# Patient Record
Sex: Female | Born: 1960
Health system: Southern US, Community
[De-identification: ages and names within clinical notes are randomized; demographics above are authoritative.]

## PROBLEM LIST (undated history)

## (undated) VITALS — BP 126/83 | HR 81 | Temp 98.0°F | Resp 18 | Ht 67.0 in | Wt 240.0 lb

## (undated) DIAGNOSIS — S82892A Other fracture of left lower leg, initial encounter for closed fracture: Secondary | ICD-10-CM

## (undated) DIAGNOSIS — G40909 Epilepsy, unspecified, not intractable, without status epilepticus: Secondary | ICD-10-CM

## (undated) DIAGNOSIS — M549 Dorsalgia, unspecified: Secondary | ICD-10-CM

## (undated) DIAGNOSIS — M129 Arthropathy, unspecified: Secondary | ICD-10-CM

## (undated) DIAGNOSIS — F0280 Dementia in other diseases classified elsewhere without behavioral disturbance: Secondary | ICD-10-CM

## (undated) DIAGNOSIS — J45909 Unspecified asthma, uncomplicated: Secondary | ICD-10-CM

## (undated) DIAGNOSIS — D649 Anemia, unspecified: Secondary | ICD-10-CM

## (undated) DIAGNOSIS — M797 Fibromyalgia: Secondary | ICD-10-CM

## (undated) DIAGNOSIS — R569 Unspecified convulsions: Secondary | ICD-10-CM

## (undated) DIAGNOSIS — Z9981 Dependence on supplemental oxygen: Secondary | ICD-10-CM

## (undated) DIAGNOSIS — G35 Multiple sclerosis: Secondary | ICD-10-CM

## (undated) DIAGNOSIS — F329 Major depressive disorder, single episode, unspecified: Secondary | ICD-10-CM

## (undated) DIAGNOSIS — K219 Gastro-esophageal reflux disease without esophagitis: Secondary | ICD-10-CM

## (undated) DIAGNOSIS — G4733 Obstructive sleep apnea (adult) (pediatric): Secondary | ICD-10-CM

## (undated) DIAGNOSIS — I10 Essential (primary) hypertension: Secondary | ICD-10-CM

## (undated) DIAGNOSIS — Z9889 Other specified postprocedural states: Secondary | ICD-10-CM

## (undated) DIAGNOSIS — F319 Bipolar disorder, unspecified: Secondary | ICD-10-CM

## (undated) DIAGNOSIS — R112 Nausea with vomiting, unspecified: Secondary | ICD-10-CM

## (undated) DIAGNOSIS — R7309 Other abnormal glucose: Secondary | ICD-10-CM

## (undated) DIAGNOSIS — F411 Generalized anxiety disorder: Secondary | ICD-10-CM

## (undated) DIAGNOSIS — M1990 Unspecified osteoarthritis, unspecified site: Secondary | ICD-10-CM

## (undated) HISTORY — DX: Obstructive sleep apnea (adult) (pediatric): G47.33

## (undated) HISTORY — PX: CHOLECYSTECTOMY: SHX55

## (undated) HISTORY — PX: TUBAL LIGATION: SHX77

## (undated) HISTORY — DX: Other abnormal glucose: R73.09

## (undated) HISTORY — DX: Epilepsy, unspecified, not intractable, without status epilepticus: G40.909

## (undated) HISTORY — DX: Dementia in other diseases classified elsewhere, unspecified severity, without behavioral disturbance, psychotic disturbance, mood disturbance, and anxiety: F02.80

## (undated) HISTORY — DX: Gastro-esophageal reflux disease without esophagitis: K21.9

## (undated) HISTORY — PX: VAGINAL HYSTERECTOMY: SUR661

## (undated) HISTORY — PX: APPENDECTOMY: SHX54

## (undated) HISTORY — DX: Arthropathy, unspecified: M12.9

## (undated) HISTORY — DX: Fibromyalgia: M79.7

## (undated) HISTORY — PX: HERNIA REPAIR: SHX51

## (undated) HISTORY — PX: BREAST SURGERY: SHX581

## (undated) HISTORY — DX: Dorsalgia, unspecified: M54.9

## (undated) HISTORY — DX: Generalized anxiety disorder: F41.1

## (undated) HISTORY — DX: Anemia, unspecified: D64.9

## (undated) HISTORY — DX: Major depressive disorder, single episode, unspecified: F32.9

---

## 1986-07-06 HISTORY — PX: TUBAL LIGATION: SHX77

## 1986-07-06 HISTORY — PX: APPENDECTOMY: SHX54

## 1998-07-19 ENCOUNTER — Encounter: Payer: Self-pay | Admitting: Emergency Medicine

## 1998-07-19 ENCOUNTER — Emergency Department (HOSPITAL_COMMUNITY): Admission: EM | Admit: 1998-07-19 | Discharge: 1998-07-19 | Payer: Self-pay | Admitting: Emergency Medicine

## 1998-09-30 ENCOUNTER — Inpatient Hospital Stay (HOSPITAL_COMMUNITY): Admission: AD | Admit: 1998-09-30 | Discharge: 1998-09-30 | Payer: Self-pay | Admitting: Obstetrics & Gynecology

## 1998-10-10 ENCOUNTER — Other Ambulatory Visit: Admission: RE | Admit: 1998-10-10 | Discharge: 1998-10-10 | Payer: Self-pay | Admitting: Obstetrics and Gynecology

## 1998-11-27 ENCOUNTER — Inpatient Hospital Stay (HOSPITAL_COMMUNITY): Admission: AD | Admit: 1998-11-27 | Discharge: 1998-11-27 | Payer: Self-pay | Admitting: Obstetrics and Gynecology

## 1998-12-13 ENCOUNTER — Inpatient Hospital Stay (HOSPITAL_COMMUNITY): Admission: RE | Admit: 1998-12-13 | Discharge: 1998-12-14 | Payer: Self-pay | Admitting: Obstetrics and Gynecology

## 1999-09-05 ENCOUNTER — Encounter: Payer: Self-pay | Admitting: Emergency Medicine

## 1999-09-05 ENCOUNTER — Emergency Department (HOSPITAL_COMMUNITY): Admission: EM | Admit: 1999-09-05 | Discharge: 1999-09-05 | Payer: Self-pay | Admitting: Emergency Medicine

## 1999-09-15 ENCOUNTER — Encounter (INDEPENDENT_AMBULATORY_CARE_PROVIDER_SITE_OTHER): Payer: Self-pay | Admitting: *Deleted

## 1999-09-15 ENCOUNTER — Encounter: Payer: Self-pay | Admitting: Cardiovascular Disease

## 1999-09-15 ENCOUNTER — Inpatient Hospital Stay (HOSPITAL_COMMUNITY): Admission: AD | Admit: 1999-09-15 | Discharge: 1999-09-17 | Payer: Self-pay | Admitting: Cardiovascular Disease

## 1999-09-16 ENCOUNTER — Encounter: Payer: Self-pay | Admitting: Cardiovascular Disease

## 1999-09-25 ENCOUNTER — Encounter: Payer: Self-pay | Admitting: Critical Care Medicine

## 2003-07-07 HISTORY — PX: VAGINAL HYSTERECTOMY: SUR661

## 2005-01-28 ENCOUNTER — Emergency Department (HOSPITAL_COMMUNITY): Admission: EM | Admit: 2005-01-28 | Discharge: 2005-01-28 | Payer: Self-pay | Admitting: Emergency Medicine

## 2005-08-20 ENCOUNTER — Emergency Department (HOSPITAL_COMMUNITY): Admission: EM | Admit: 2005-08-20 | Discharge: 2005-08-20 | Payer: Self-pay | Admitting: Emergency Medicine

## 2005-08-21 ENCOUNTER — Emergency Department (HOSPITAL_COMMUNITY): Admission: EM | Admit: 2005-08-21 | Discharge: 2005-08-21 | Payer: Self-pay | Admitting: Emergency Medicine

## 2005-08-26 ENCOUNTER — Ambulatory Visit: Payer: Self-pay | Admitting: Internal Medicine

## 2005-08-31 ENCOUNTER — Ambulatory Visit: Payer: Self-pay | Admitting: Internal Medicine

## 2005-09-01 ENCOUNTER — Ambulatory Visit: Payer: Self-pay | Admitting: Internal Medicine

## 2005-11-17 ENCOUNTER — Emergency Department (HOSPITAL_COMMUNITY): Admission: EM | Admit: 2005-11-17 | Discharge: 2005-11-17 | Payer: Self-pay | Admitting: Emergency Medicine

## 2005-11-29 ENCOUNTER — Emergency Department (HOSPITAL_COMMUNITY): Admission: EM | Admit: 2005-11-29 | Discharge: 2005-11-29 | Payer: Self-pay | Admitting: Emergency Medicine

## 2006-01-28 ENCOUNTER — Emergency Department (HOSPITAL_COMMUNITY): Admission: EM | Admit: 2006-01-28 | Discharge: 2006-01-28 | Payer: Self-pay | Admitting: Emergency Medicine

## 2006-06-03 ENCOUNTER — Inpatient Hospital Stay (HOSPITAL_COMMUNITY): Admission: AD | Admit: 2006-06-03 | Discharge: 2006-06-03 | Payer: Self-pay | Admitting: Gynecology

## 2006-09-24 ENCOUNTER — Emergency Department (HOSPITAL_COMMUNITY): Admission: EM | Admit: 2006-09-24 | Discharge: 2006-09-24 | Payer: Self-pay | Admitting: Emergency Medicine

## 2006-10-28 ENCOUNTER — Emergency Department (HOSPITAL_COMMUNITY): Admission: EM | Admit: 2006-10-28 | Discharge: 2006-10-28 | Payer: Self-pay | Admitting: Emergency Medicine

## 2006-11-25 ENCOUNTER — Emergency Department (HOSPITAL_COMMUNITY): Admission: EM | Admit: 2006-11-25 | Discharge: 2006-11-25 | Payer: Self-pay | Admitting: Emergency Medicine

## 2006-12-02 ENCOUNTER — Encounter: Admission: RE | Admit: 2006-12-02 | Discharge: 2006-12-02 | Payer: Self-pay | Admitting: Orthopedic Surgery

## 2006-12-05 ENCOUNTER — Emergency Department (HOSPITAL_COMMUNITY): Admission: EM | Admit: 2006-12-05 | Discharge: 2006-12-05 | Payer: Self-pay | Admitting: Emergency Medicine

## 2006-12-06 ENCOUNTER — Observation Stay (HOSPITAL_COMMUNITY): Admission: EM | Admit: 2006-12-06 | Discharge: 2006-12-06 | Payer: Self-pay | Admitting: Emergency Medicine

## 2006-12-20 ENCOUNTER — Encounter: Admission: RE | Admit: 2006-12-20 | Discharge: 2006-12-20 | Payer: Self-pay | Admitting: Orthopedic Surgery

## 2006-12-24 ENCOUNTER — Ambulatory Visit (HOSPITAL_COMMUNITY): Admission: RE | Admit: 2006-12-24 | Discharge: 2006-12-24 | Payer: Self-pay | Admitting: Obstetrics and Gynecology

## 2006-12-24 ENCOUNTER — Encounter (INDEPENDENT_AMBULATORY_CARE_PROVIDER_SITE_OTHER): Payer: Self-pay | Admitting: Obstetrics and Gynecology

## 2007-01-05 ENCOUNTER — Encounter: Admission: RE | Admit: 2007-01-05 | Discharge: 2007-01-05 | Payer: Self-pay | Admitting: Orthopedic Surgery

## 2007-01-14 ENCOUNTER — Ambulatory Visit (HOSPITAL_COMMUNITY): Admission: RE | Admit: 2007-01-14 | Discharge: 2007-01-14 | Payer: Self-pay | Admitting: Obstetrics and Gynecology

## 2007-02-17 ENCOUNTER — Emergency Department (HOSPITAL_COMMUNITY): Admission: EM | Admit: 2007-02-17 | Discharge: 2007-02-18 | Payer: Self-pay | Admitting: Emergency Medicine

## 2007-04-24 ENCOUNTER — Inpatient Hospital Stay (HOSPITAL_COMMUNITY): Admission: AD | Admit: 2007-04-24 | Discharge: 2007-04-24 | Payer: Self-pay | Admitting: Obstetrics and Gynecology

## 2008-01-16 ENCOUNTER — Ambulatory Visit (HOSPITAL_COMMUNITY): Admission: RE | Admit: 2008-01-16 | Discharge: 2008-01-16 | Payer: Self-pay | Admitting: Obstetrics and Gynecology

## 2008-01-23 ENCOUNTER — Emergency Department (HOSPITAL_COMMUNITY): Admission: EM | Admit: 2008-01-23 | Discharge: 2008-01-23 | Payer: Self-pay | Admitting: Emergency Medicine

## 2008-01-24 ENCOUNTER — Encounter: Admission: RE | Admit: 2008-01-24 | Discharge: 2008-01-24 | Payer: Self-pay | Admitting: Anesthesiology

## 2008-04-19 ENCOUNTER — Emergency Department (HOSPITAL_COMMUNITY): Admission: EM | Admit: 2008-04-19 | Discharge: 2008-04-19 | Payer: Self-pay | Admitting: Emergency Medicine

## 2008-06-01 ENCOUNTER — Inpatient Hospital Stay (HOSPITAL_COMMUNITY): Admission: EM | Admit: 2008-06-01 | Discharge: 2008-06-05 | Payer: Self-pay | Admitting: *Deleted

## 2008-06-01 ENCOUNTER — Ambulatory Visit: Payer: Self-pay | Admitting: Emergency Medicine

## 2008-06-13 DIAGNOSIS — G8929 Other chronic pain: Secondary | ICD-10-CM

## 2008-06-13 DIAGNOSIS — F319 Bipolar disorder, unspecified: Secondary | ICD-10-CM | POA: Insufficient documentation

## 2008-06-13 DIAGNOSIS — M549 Dorsalgia, unspecified: Secondary | ICD-10-CM

## 2008-06-13 DIAGNOSIS — G4733 Obstructive sleep apnea (adult) (pediatric): Secondary | ICD-10-CM | POA: Insufficient documentation

## 2008-06-13 DIAGNOSIS — F329 Major depressive disorder, single episode, unspecified: Secondary | ICD-10-CM | POA: Insufficient documentation

## 2008-06-13 DIAGNOSIS — R7309 Other abnormal glucose: Secondary | ICD-10-CM | POA: Insufficient documentation

## 2008-06-13 DIAGNOSIS — J45909 Unspecified asthma, uncomplicated: Secondary | ICD-10-CM | POA: Insufficient documentation

## 2008-06-13 DIAGNOSIS — F411 Generalized anxiety disorder: Secondary | ICD-10-CM | POA: Insufficient documentation

## 2008-06-13 DIAGNOSIS — M129 Arthropathy, unspecified: Secondary | ICD-10-CM | POA: Insufficient documentation

## 2008-06-13 DIAGNOSIS — M1990 Unspecified osteoarthritis, unspecified site: Secondary | ICD-10-CM | POA: Insufficient documentation

## 2008-06-13 HISTORY — DX: Obstructive sleep apnea (adult) (pediatric): G47.33

## 2008-06-13 HISTORY — DX: Bipolar disorder, unspecified: F31.9

## 2008-06-13 HISTORY — DX: Other abnormal glucose: R73.09

## 2008-06-13 HISTORY — DX: Other chronic pain: G89.29

## 2008-06-14 ENCOUNTER — Ambulatory Visit: Payer: Self-pay | Admitting: Critical Care Medicine

## 2008-07-05 ENCOUNTER — Ambulatory Visit: Payer: Self-pay | Admitting: Critical Care Medicine

## 2008-07-05 DIAGNOSIS — K219 Gastro-esophageal reflux disease without esophagitis: Secondary | ICD-10-CM | POA: Insufficient documentation

## 2008-07-05 HISTORY — DX: Gastro-esophageal reflux disease without esophagitis: K21.9

## 2008-07-06 DIAGNOSIS — J9611 Chronic respiratory failure with hypoxia: Secondary | ICD-10-CM

## 2008-07-06 HISTORY — DX: Chronic respiratory failure with hypoxia: J96.11

## 2008-09-12 ENCOUNTER — Ambulatory Visit: Payer: Self-pay | Admitting: Critical Care Medicine

## 2008-09-12 DIAGNOSIS — R1314 Dysphagia, pharyngoesophageal phase: Secondary | ICD-10-CM

## 2008-09-12 HISTORY — DX: Dysphagia, pharyngoesophageal phase: R13.14

## 2008-09-20 ENCOUNTER — Ambulatory Visit: Payer: Self-pay | Admitting: Internal Medicine

## 2008-09-20 DIAGNOSIS — J309 Allergic rhinitis, unspecified: Secondary | ICD-10-CM | POA: Insufficient documentation

## 2008-09-20 DIAGNOSIS — Z91038 Other insect allergy status: Secondary | ICD-10-CM | POA: Insufficient documentation

## 2008-09-20 HISTORY — DX: Allergic rhinitis, unspecified: J30.9

## 2008-09-20 LAB — CONVERTED CEMR LAB
Basophils Absolute: 0 10*3/uL (ref 0.0–0.1)
Basophils Relative: 0.8 % (ref 0.0–3.0)
Eosinophils Absolute: 0.4 10*3/uL (ref 0.0–0.7)
Eosinophils Relative: 7.6 % — ABNORMAL HIGH (ref 0.0–5.0)
HCT: 40.2 % (ref 36.0–46.0)
Hemoglobin: 13.5 g/dL (ref 12.0–15.0)
Lymphocytes Relative: 31.4 % (ref 12.0–46.0)
Lymphs Abs: 1.9 10*3/uL (ref 0.7–4.0)
MCHC: 33.6 g/dL (ref 30.0–36.0)
MCV: 84.3 fL (ref 78.0–100.0)
Monocytes Absolute: 0.5 10*3/uL (ref 0.1–1.0)
Monocytes Relative: 8.8 % (ref 3.0–12.0)
Neutro Abs: 3.1 10*3/uL (ref 1.4–7.7)
Neutrophils Relative %: 51.4 % (ref 43.0–77.0)
Platelets: 317 10*3/uL (ref 150.0–400.0)
RBC: 4.78 M/uL (ref 3.87–5.11)
RDW: 13.3 % (ref 11.5–14.6)
WBC: 5.9 10*3/uL (ref 4.5–10.5)

## 2008-09-21 ENCOUNTER — Encounter: Payer: Self-pay | Admitting: Internal Medicine

## 2008-09-21 LAB — CONVERTED CEMR LAB: IgE (Immunoglobulin E), Serum: 502.1 intl units/mL — ABNORMAL HIGH (ref 0.0–180.0)

## 2008-10-07 DIAGNOSIS — R51 Headache: Secondary | ICD-10-CM

## 2008-10-10 ENCOUNTER — Ambulatory Visit: Payer: Self-pay | Admitting: Internal Medicine

## 2008-10-10 DIAGNOSIS — Z91013 Allergy to seafood: Secondary | ICD-10-CM | POA: Insufficient documentation

## 2008-10-11 ENCOUNTER — Ambulatory Visit: Payer: Self-pay | Admitting: Internal Medicine

## 2008-10-11 DIAGNOSIS — R1319 Other dysphagia: Secondary | ICD-10-CM | POA: Insufficient documentation

## 2008-11-09 ENCOUNTER — Ambulatory Visit: Payer: Self-pay | Admitting: Critical Care Medicine

## 2008-11-13 ENCOUNTER — Ambulatory Visit: Payer: Self-pay | Admitting: Internal Medicine

## 2008-11-13 ENCOUNTER — Ambulatory Visit (HOSPITAL_COMMUNITY): Admission: RE | Admit: 2008-11-13 | Discharge: 2008-11-13 | Payer: Self-pay | Admitting: Internal Medicine

## 2009-01-08 ENCOUNTER — Emergency Department (HOSPITAL_COMMUNITY): Admission: EM | Admit: 2009-01-08 | Discharge: 2009-01-09 | Payer: Self-pay | Admitting: Emergency Medicine

## 2009-01-09 ENCOUNTER — Emergency Department (HOSPITAL_COMMUNITY): Admission: EM | Admit: 2009-01-09 | Discharge: 2009-01-09 | Payer: Self-pay | Admitting: Emergency Medicine

## 2009-01-16 ENCOUNTER — Ambulatory Visit: Payer: Self-pay | Admitting: Critical Care Medicine

## 2009-01-22 ENCOUNTER — Emergency Department (HOSPITAL_COMMUNITY): Admission: EM | Admit: 2009-01-22 | Discharge: 2009-01-22 | Payer: Self-pay | Admitting: Emergency Medicine

## 2009-01-23 ENCOUNTER — Emergency Department (HOSPITAL_COMMUNITY): Admission: EM | Admit: 2009-01-23 | Discharge: 2009-01-23 | Payer: Self-pay | Admitting: Emergency Medicine

## 2009-01-28 ENCOUNTER — Emergency Department (HOSPITAL_COMMUNITY): Admission: EM | Admit: 2009-01-28 | Discharge: 2009-01-29 | Payer: Self-pay | Admitting: Emergency Medicine

## 2009-01-29 ENCOUNTER — Emergency Department (HOSPITAL_COMMUNITY): Admission: EM | Admit: 2009-01-29 | Discharge: 2009-01-29 | Payer: Self-pay | Admitting: Emergency Medicine

## 2009-02-18 ENCOUNTER — Telehealth (INDEPENDENT_AMBULATORY_CARE_PROVIDER_SITE_OTHER): Payer: Self-pay | Admitting: *Deleted

## 2009-02-18 ENCOUNTER — Encounter: Payer: Self-pay | Admitting: Internal Medicine

## 2009-02-18 ENCOUNTER — Ambulatory Visit: Payer: Self-pay | Admitting: Critical Care Medicine

## 2009-02-19 ENCOUNTER — Ambulatory Visit: Payer: Self-pay | Admitting: Internal Medicine

## 2009-02-20 ENCOUNTER — Encounter: Payer: Self-pay | Admitting: Critical Care Medicine

## 2009-03-01 ENCOUNTER — Ambulatory Visit: Payer: Self-pay | Admitting: Internal Medicine

## 2009-03-06 ENCOUNTER — Emergency Department (HOSPITAL_COMMUNITY): Admission: EM | Admit: 2009-03-06 | Discharge: 2009-03-07 | Payer: Self-pay | Admitting: Emergency Medicine

## 2009-03-15 ENCOUNTER — Emergency Department (HOSPITAL_COMMUNITY): Admission: EM | Admit: 2009-03-15 | Discharge: 2009-03-16 | Payer: Self-pay | Admitting: Emergency Medicine

## 2009-03-19 ENCOUNTER — Ambulatory Visit: Payer: Self-pay | Admitting: Internal Medicine

## 2009-03-20 ENCOUNTER — Emergency Department (HOSPITAL_COMMUNITY): Admission: EM | Admit: 2009-03-20 | Discharge: 2009-03-21 | Payer: Self-pay | Admitting: Emergency Medicine

## 2009-03-21 ENCOUNTER — Encounter: Payer: Self-pay | Admitting: Critical Care Medicine

## 2009-03-22 ENCOUNTER — Telehealth: Payer: Self-pay | Admitting: Critical Care Medicine

## 2009-03-22 ENCOUNTER — Ambulatory Visit: Payer: Self-pay | Admitting: Internal Medicine

## 2009-03-27 ENCOUNTER — Emergency Department (HOSPITAL_COMMUNITY): Admission: EM | Admit: 2009-03-27 | Discharge: 2009-03-28 | Payer: Self-pay | Admitting: Emergency Medicine

## 2009-03-29 ENCOUNTER — Emergency Department (HOSPITAL_COMMUNITY): Admission: EM | Admit: 2009-03-29 | Discharge: 2009-03-30 | Payer: Self-pay | Admitting: Emergency Medicine

## 2009-03-30 ENCOUNTER — Encounter: Payer: Self-pay | Admitting: Critical Care Medicine

## 2009-03-31 ENCOUNTER — Emergency Department (HOSPITAL_COMMUNITY): Admission: EM | Admit: 2009-03-31 | Discharge: 2009-03-31 | Payer: Self-pay | Admitting: Emergency Medicine

## 2009-04-01 ENCOUNTER — Encounter: Payer: Self-pay | Admitting: Critical Care Medicine

## 2009-04-02 ENCOUNTER — Ambulatory Visit: Payer: Self-pay | Admitting: Internal Medicine

## 2009-04-02 ENCOUNTER — Telehealth: Payer: Self-pay | Admitting: Critical Care Medicine

## 2009-04-02 ENCOUNTER — Ambulatory Visit: Payer: Self-pay | Admitting: Critical Care Medicine

## 2009-04-02 DIAGNOSIS — J383 Other diseases of vocal cords: Secondary | ICD-10-CM | POA: Insufficient documentation

## 2009-04-02 HISTORY — DX: Other diseases of vocal cords: J38.3

## 2009-04-03 ENCOUNTER — Encounter: Payer: Self-pay | Admitting: Critical Care Medicine

## 2009-04-05 ENCOUNTER — Ambulatory Visit: Payer: Self-pay | Admitting: Cardiovascular Disease

## 2009-04-05 ENCOUNTER — Ambulatory Visit: Payer: Self-pay | Admitting: Pulmonary Disease

## 2009-04-05 ENCOUNTER — Ambulatory Visit: Payer: Self-pay | Admitting: Internal Medicine

## 2009-04-05 ENCOUNTER — Inpatient Hospital Stay (HOSPITAL_COMMUNITY): Admission: EM | Admit: 2009-04-05 | Discharge: 2009-04-08 | Payer: Self-pay | Admitting: Emergency Medicine

## 2009-04-08 ENCOUNTER — Encounter: Payer: Self-pay | Admitting: Internal Medicine

## 2009-04-08 ENCOUNTER — Encounter: Payer: Self-pay | Admitting: Critical Care Medicine

## 2009-04-14 ENCOUNTER — Telehealth (INDEPENDENT_AMBULATORY_CARE_PROVIDER_SITE_OTHER): Payer: Self-pay | Admitting: Physician Assistant

## 2009-04-15 ENCOUNTER — Ambulatory Visit: Payer: Self-pay | Admitting: Critical Care Medicine

## 2009-04-15 ENCOUNTER — Ambulatory Visit: Payer: Self-pay | Admitting: Internal Medicine

## 2009-04-16 ENCOUNTER — Emergency Department (HOSPITAL_COMMUNITY): Admission: EM | Admit: 2009-04-16 | Discharge: 2009-04-16 | Payer: Self-pay | Admitting: Emergency Medicine

## 2009-04-30 ENCOUNTER — Telehealth: Payer: Self-pay | Admitting: Critical Care Medicine

## 2009-05-01 ENCOUNTER — Encounter: Payer: Self-pay | Admitting: Pulmonary Disease

## 2009-05-01 ENCOUNTER — Ambulatory Visit (HOSPITAL_BASED_OUTPATIENT_CLINIC_OR_DEPARTMENT_OTHER): Admission: RE | Admit: 2009-05-01 | Discharge: 2009-05-01 | Payer: Self-pay | Admitting: Critical Care Medicine

## 2009-05-09 ENCOUNTER — Telehealth (INDEPENDENT_AMBULATORY_CARE_PROVIDER_SITE_OTHER): Payer: Self-pay | Admitting: *Deleted

## 2009-05-12 ENCOUNTER — Ambulatory Visit: Payer: Self-pay | Admitting: Pulmonary Disease

## 2009-05-13 ENCOUNTER — Ambulatory Visit: Payer: Self-pay | Admitting: Pulmonary Disease

## 2009-05-15 ENCOUNTER — Encounter: Payer: Self-pay | Admitting: Critical Care Medicine

## 2009-05-15 ENCOUNTER — Encounter: Admission: RE | Admit: 2009-05-15 | Discharge: 2009-06-05 | Payer: Self-pay | Admitting: Critical Care Medicine

## 2009-05-20 ENCOUNTER — Emergency Department (HOSPITAL_COMMUNITY): Admission: EM | Admit: 2009-05-20 | Discharge: 2009-05-20 | Payer: Self-pay | Admitting: Emergency Medicine

## 2009-05-21 ENCOUNTER — Observation Stay (HOSPITAL_COMMUNITY): Admission: EM | Admit: 2009-05-21 | Discharge: 2009-05-21 | Payer: Self-pay | Admitting: Emergency Medicine

## 2009-05-22 ENCOUNTER — Emergency Department (HOSPITAL_COMMUNITY): Admission: EM | Admit: 2009-05-22 | Discharge: 2009-05-22 | Payer: Self-pay | Admitting: Emergency Medicine

## 2009-05-24 ENCOUNTER — Ambulatory Visit: Payer: Self-pay | Admitting: Internal Medicine

## 2009-06-03 ENCOUNTER — Encounter: Payer: Self-pay | Admitting: Critical Care Medicine

## 2009-06-04 ENCOUNTER — Ambulatory Visit: Payer: Self-pay | Admitting: Pulmonary Disease

## 2009-06-04 DIAGNOSIS — J4540 Moderate persistent asthma, uncomplicated: Secondary | ICD-10-CM

## 2009-06-04 DIAGNOSIS — J45909 Unspecified asthma, uncomplicated: Secondary | ICD-10-CM

## 2009-06-04 HISTORY — DX: Moderate persistent asthma, uncomplicated: J45.40

## 2009-06-17 ENCOUNTER — Emergency Department (HOSPITAL_COMMUNITY): Admission: EM | Admit: 2009-06-17 | Discharge: 2009-06-18 | Payer: Self-pay | Admitting: Emergency Medicine

## 2009-08-01 ENCOUNTER — Telehealth (INDEPENDENT_AMBULATORY_CARE_PROVIDER_SITE_OTHER): Payer: Self-pay | Admitting: *Deleted

## 2009-08-07 ENCOUNTER — Encounter: Payer: Self-pay | Admitting: Critical Care Medicine

## 2009-09-04 ENCOUNTER — Telehealth (INDEPENDENT_AMBULATORY_CARE_PROVIDER_SITE_OTHER): Payer: Self-pay | Admitting: *Deleted

## 2009-09-25 ENCOUNTER — Telehealth: Payer: Self-pay | Admitting: Internal Medicine

## 2009-09-26 ENCOUNTER — Emergency Department (HOSPITAL_COMMUNITY)
Admission: EM | Admit: 2009-09-26 | Discharge: 2009-09-26 | Payer: Self-pay | Source: Home / Self Care | Admitting: Emergency Medicine

## 2009-09-26 ENCOUNTER — Encounter: Payer: Self-pay | Admitting: Internal Medicine

## 2009-09-27 ENCOUNTER — Ambulatory Visit: Payer: Self-pay | Admitting: Internal Medicine

## 2009-09-27 ENCOUNTER — Telehealth (INDEPENDENT_AMBULATORY_CARE_PROVIDER_SITE_OTHER): Payer: Self-pay | Admitting: *Deleted

## 2009-09-27 DIAGNOSIS — R0989 Other specified symptoms and signs involving the circulatory and respiratory systems: Secondary | ICD-10-CM | POA: Insufficient documentation

## 2009-09-27 DIAGNOSIS — R109 Unspecified abdominal pain: Secondary | ICD-10-CM

## 2009-09-27 DIAGNOSIS — R0609 Other forms of dyspnea: Secondary | ICD-10-CM | POA: Insufficient documentation

## 2009-09-27 HISTORY — DX: Unspecified abdominal pain: R10.9

## 2009-10-01 ENCOUNTER — Emergency Department (HOSPITAL_COMMUNITY)
Admission: EM | Admit: 2009-10-01 | Discharge: 2009-10-02 | Payer: Self-pay | Source: Home / Self Care | Admitting: Emergency Medicine

## 2009-10-02 LAB — CONVERTED CEMR LAB
CK-MB: 1.9 ng/mL (ref 0.3–4.0)
Pro B Natriuretic peptide (BNP): 26 pg/mL (ref 0.0–100.0)
Relative Index: 0.7 (ref 0.0–2.5)
Sed Rate: 37 mm/hr — ABNORMAL HIGH (ref 0–22)
Total CK: 266 units/L — ABNORMAL HIGH (ref 7–177)

## 2009-10-11 ENCOUNTER — Ambulatory Visit: Payer: Self-pay | Admitting: Critical Care Medicine

## 2009-10-15 ENCOUNTER — Encounter: Payer: Self-pay | Admitting: Internal Medicine

## 2009-10-17 ENCOUNTER — Encounter: Payer: Self-pay | Admitting: Internal Medicine

## 2009-10-24 ENCOUNTER — Ambulatory Visit: Payer: Self-pay | Admitting: Critical Care Medicine

## 2009-10-29 ENCOUNTER — Ambulatory Visit: Payer: Self-pay | Admitting: Critical Care Medicine

## 2009-10-31 ENCOUNTER — Telehealth (INDEPENDENT_AMBULATORY_CARE_PROVIDER_SITE_OTHER): Payer: Self-pay | Admitting: *Deleted

## 2009-11-05 ENCOUNTER — Ambulatory Visit: Payer: Self-pay | Admitting: Critical Care Medicine

## 2009-11-05 ENCOUNTER — Telehealth (INDEPENDENT_AMBULATORY_CARE_PROVIDER_SITE_OTHER): Payer: Self-pay | Admitting: *Deleted

## 2009-11-08 ENCOUNTER — Encounter: Payer: Self-pay | Admitting: Critical Care Medicine

## 2009-11-09 ENCOUNTER — Encounter: Payer: Self-pay | Admitting: Internal Medicine

## 2009-11-11 ENCOUNTER — Telehealth (INDEPENDENT_AMBULATORY_CARE_PROVIDER_SITE_OTHER): Payer: Self-pay | Admitting: *Deleted

## 2009-11-21 ENCOUNTER — Telehealth: Payer: Self-pay | Admitting: Critical Care Medicine

## 2009-11-22 ENCOUNTER — Telehealth (INDEPENDENT_AMBULATORY_CARE_PROVIDER_SITE_OTHER): Payer: Self-pay | Admitting: *Deleted

## 2009-11-26 ENCOUNTER — Telehealth: Payer: Self-pay | Admitting: Critical Care Medicine

## 2009-12-03 ENCOUNTER — Ambulatory Visit: Payer: Self-pay | Admitting: Internal Medicine

## 2009-12-04 ENCOUNTER — Encounter: Payer: Self-pay | Admitting: Internal Medicine

## 2009-12-13 ENCOUNTER — Ambulatory Visit: Payer: Self-pay | Admitting: Internal Medicine

## 2010-01-21 ENCOUNTER — Encounter: Payer: Self-pay | Admitting: Critical Care Medicine

## 2010-01-22 ENCOUNTER — Ambulatory Visit: Payer: Self-pay | Admitting: Critical Care Medicine

## 2010-02-04 ENCOUNTER — Telehealth: Payer: Self-pay | Admitting: Internal Medicine

## 2010-02-17 ENCOUNTER — Telehealth: Payer: Self-pay | Admitting: Internal Medicine

## 2010-02-17 ENCOUNTER — Telehealth (INDEPENDENT_AMBULATORY_CARE_PROVIDER_SITE_OTHER): Payer: Self-pay | Admitting: *Deleted

## 2010-04-25 ENCOUNTER — Emergency Department (HOSPITAL_COMMUNITY): Admission: EM | Admit: 2010-04-25 | Discharge: 2010-04-25 | Payer: Self-pay | Admitting: Emergency Medicine

## 2010-08-05 NOTE — Progress Notes (Signed)
Summary: talk to nurse  Phone Note Call from Patient Call back at Home Phone [phone]   Caller: Patient Call For: Chappuis Summary of Call: calling to talk to nurse about diet dr Delford Field wanted her on Initial call taken by: Rickard Patience,  April 02, 2009 5:04 PM  Follow-up for Phone Call        pt called back re: diet PEW was going to give her yesterday at her appt.  Please respond Follow-up by: Eugene Gavia,  April 03, 2009 10:43 AM  Additional Follow-up for Phone Call Additional follow up Details #1::        pt states Dr. Delford Field wanted to start the GERD diet. I have palced a copy of this at the front with pt name on it. She stated her sister will come by later to pick it up. Carron Curie CMA  April 03, 2009 11:09 AM

## 2010-08-05 NOTE — Progress Notes (Signed)
Summary: [name] refills  Phone Note Call from Patient Call back at [facility] Phone [phone]   Caller: Patient Call For: Ruppel Reason for Call: Refill Medication, Talk to Nurse Summary of Call: Need rx for [facility] called in - PW gave her samples but no rx. [facility] Initial call taken by: Eugene Gavia,  August 01, 2009 10:21 AM  Follow-up for Phone Call        The patient says the pharmacy needs reauthorization for [facility].refills. She is doing fine on this medication and is sch to f/u with PEW in March 2011. The patient aware we will give refills until she is seen for appt. Follow-up by: Michel Bickers CMA,  August 01, 2009 10:29 AM    Prescriptions: [name] 200-5 MCG/ACT AERO (MOMETASONE FURO-FORMOTEROL FUM) 2 puffs twice per day Brand medically necessary #1 x 2   Entered by:   Michel Bickers CMA   Authorized by:   Storm Frisk MD   Signed by:   Michel Bickers CMA on 08/01/2009   Method used:   [facility]        [facility]  [address] [phone]* (retail)       [address]       [place], [place]  [id number]       Ph: [phone] or [phone]       Fax: [phone]   RxID:   [id number]

## 2010-08-05 NOTE — Assessment & Plan Note (Signed)
Summary: NP follow up - med calendar   Copy to:  [name], MD Primary Provider/Referring Provider:  Juleen Starr.  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    June 14, 2008- Previous pt of Dr. Delford Field, last seen > 5years ago. -Presents for post hospital follow up. Admitted 11/27-12/1/09 for status asthmaticus with vent dependent resp. failure. Pt has a history of  asthma since childhood who presented with 5-6 hours of progressive  dyspnea and required intubation upon arrival to the emergency  department,  treated with systemic  steroids, nebulized bronchodilators, mechanical ventilatory support, DVT  prophylaxis, stress ulcer prophylaxis, and glycemic control.  She  responded well to these therapies.  She extubated herself in the early  morning hours of June 03, 2008, without problems.    09/12/08- asthma flare- tx w/ steroids. referral to allergy and GI   10/10/08- Allergy evaluation- asthma- Tree pollen heavy now and distinctly bothering eyes and nose itch and drain. Feels wheezier. Here for allergy testing- msses antihistamines. Skin tests- Strongly POS grass, weed, tree, dust    Nov 09, 2008-- acute office viist. Complains of increased SOB, wheezing, prod cough with brownish-colored mucus onset last night - denies f/c/s. Assures  is taking all her meds w/o missed doses. Has seen some blood streaked mucous    September 27, 2009 ccAcute visit.  Pt was seen at [facility] ER last night with increased SOB and left side chest and abdominal pain.  She was told that she may have possible blood clot b/c of elevated d-dimer.  She states that her CP only occurs upon inspration.  Gets SOB walking from room to room = baseline.  new onset epigastric and LLQ pain x 1 week  better lying down  and worse sitting.  cp worse with deep breath, abd pain worse with coughing.  no change in bowels better with xanax.  Never present supine.  No n or v.   October 11, 2009--Presents for a follow up and med reivew. est med calendar - pt did not bring her meds with her today.  pt c/o pain on left side at groin area, SOB and "some wheezing" x1week. Last visit had atypical chest/abd pain suspected to be IBS. Cardiac enzymes neg. Neg w/up for PE and ESR and BNP normal. She has had an extensive work up for dyspnea/DOE/ and atypical chest pain. over last 6 months she has had numerous chest xray-NAD , CT chest-neg for PE and VQ scan x 2 w/  low probalbility for PE. 2 D ECHO 10/10 showed nml LVF w/ EF at 55-60% no PFO noted. Sleep test showed only mild OSA -no CPAP indicated. Last visit she did desat with walking but with quick recovery. Unfortuanetly did not bring in meds today. We reviewed her list and she is on multiple narcotics, pschy meds.   Medications Prior to Update: 1)  Dulera 200-5 Mcg/act Aero (Mometasone Furo-Formoterol Fum) .... 2 Puffs Twice Per Day 2)  Neurontin 400 Mg Caps (Gabapentin) .Marland Kitchen.. 1 By Mouth Four Times Daily 3)  Dexilant 60 Mg Cpdr (Dexlansoprazole) .... One By Mouth Daily 4)  Ms Contin 30 Mg Xr12h-Tab (Morphine Sulfate) .Marland Kitchen.. 1 By Mouth Two Times A Day 5)  Nabumetone 500 Mg Tabs (Nabumetone) .... Take 1 Tablet By  Mouth Two Times A Day 6)  Flexeril 10 Mg Tabs (Cyclobenzaprine Hcl) .... As Needed By Mouth Muscle Spasms 7)  Epipen 0.3 Mg/0.40ml (1:1000) Devi (Epinephrine Hcl (Anaphylaxis)) .... For Severe Allergic Reaction 8)  Ventolin Hfa 108 (90 Base) Mcg/act Aers (Albuterol Sulfate) .... 2 Puffs Every 4 Hours As Needed 9)  Lamictal 200 Mg Tabs (Lamotrigine) .... Take 1 1/2 Tab By Mouth At Bedtime 10)  Ambien 10 Mg Tabs (Zolpidem Tartrate) .... 1/2 Tab By Mouth At Bedtime As Needed 11)  Saphria 5mg  .... At Bedtime 12)  Alprazolam 0.25 Mg Tabs (Alprazolam) .... Take 1 Tablet By Mouth  Every 6 Hours As Needed 13)  Metoprolol Succinate 25 Mg Xr24h-Tab (Metoprolol Succinate) .... Take 1 Tablet By Mouth Once A Day  Current Medications (verified): 1)  Dulera 200-5 Mcg/act Aero (Mometasone Furo-Formoterol Fum) .... 2 Puffs Twice Per Day 2)  Neurontin 400 Mg Caps (Gabapentin) .Marland Kitchen.. 1 By Mouth Four Times Daily 3)  Dexilant 60 Mg Cpdr (Dexlansoprazole) .... One By Mouth Daily 4)  Ms Contin 30 Mg Xr12h-Tab (Morphine Sulfate) .... Take 1 Tablet By Mouth Three Times A Day 5)  Nabumetone 500 Mg Tabs (Nabumetone) .... Take 1 Tablet By Mouth Two Times A Day 6)  Flexeril 10 Mg Tabs (Cyclobenzaprine Hcl) .... As Needed By Mouth Muscle Spasms 7)  Epipen 0.3 Mg/0.39ml (1:1000) Devi (Epinephrine Hcl (Anaphylaxis)) .... For Severe Allergic Reaction 8)  Ventolin Hfa 108 (90 Base) Mcg/act Aers (Albuterol Sulfate) .... 2 Puffs Every 4 Hours As Needed 9)  Lamictal 200 Mg Tabs (Lamotrigine) .... Take 1 1/2 Tab By Mouth At Bedtime 10)  Ambien 10 Mg Tabs (Zolpidem Tartrate) .... 1/2 Tab By Mouth At Bedtime As Needed 11)  Saphris 5 Mg Subl (Asenapine Maleate) .Marland Kitchen.. 1 Under Tongue At Bedtime 12)  Alprazolam 0.25 Mg Tabs (Alprazolam) .... Take 1 Tablet By Mouth Every 6 Hours As Needed 13)  Metoprolol Succinate 25 Mg Xr24h-Tab (Metoprolol Succinate) .... Take 1 Tablet By Mouth Once A Day  Allergies (verified): 1)  ! Pcn 2)  ! Stadol 3)  ! Lasix 4)  ! Iodine 5)  ! Elavil 6)  ! * Shellfish 7)  ! * Latex  Past History:  Past Medical History: Last updated: 09/27/2009  Status Ashtmaticus--admission 06/01/08 with VDRF Allergic rhinoconjunctivitis- ------ Skin Test Pos 10/10/08 : Strongly POS grass, weed, tree, dust ?MS dx in 2004 in [place].  HYPERGLYCEMIA (ICD-790.29) Headache ANXIETY (ICD-300.00) DEPRESSION (ICD-311) BACK PAIN, CHRONIC (ICD-724.5)--chronic DDD on disablility ARTHRITIS (ICD-716.90) OBSTRUCTIVE SLEEP APNEA (ICD-327.23)- never CPAP.............Marland KitchenMarland KitchenDyke Brackett R D fibromyalgia Allergy-  shellfish Anemia  Pneumovax --April 15, 2009  flu shot 04/2009  Past Surgical History: Last updated: 09/20/2008 Appendectomy in 1987 C-section in 1988 Cholecystectomy in 1990 Foot surgery in 1995 Tubal ligation Hysterectomy in 2000 hernia repair  Family History: Last updated: 05/13/2009 Family History Ovarian Cancer--mother ( deceased at age28) Father deceased in his 34's of unknown causes. 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 2 children Patient never smoked.  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 Is Patient Diabetic? No Comments Medications reviewed with patient Daytime contact number verified with patient. Boone Master CNA  October 11, 2009 4:08 PM    Physical Exam  Additional Exam:  wt 241 > 269 September 27, 2009 >268 October 11, 2009  Gen. Pleasant, well-nourished, in no distress ENT - no lesions, no post nasal drip, class 2 airway Neck: No JVD, no thyromegaly, no carotid bruits Lungs: no use of accessory muscles, no dullness to percussion, clear without rales or rhonchi  Cardiovascular: Rhythm regular, heart sounds  normal, no murmurs or gallops, no peripheral edema Abdomen: soft and non-tender, no hepatosplenomegaly, BS normal. Musculoskeletal: No  deformities, no cyanosis or clubbing Neuro:  alert, non focal     Impression & Recommendations:  Problem # 1:  EXTRINSIC ASTHMA, UNSPECIFIED (ICD-493.00) Atypical Asthma w/ persistent dyspnea. . Extensive workup w/ multiple scan-It is unlikely she has a PE. w/ neg CT chest and 2 VQ scans in last 6 month. Discussed would like to limit additional scan to decrease radiation exposure (over the last 10 years she has had several CT scan and xrays). She walked today in office at steday pace w/ her cane w/ no desaturations maintained at 93%.  Will set up for overnight oximetry -(did have some desats w/ sleep test )  return for PFTs  cont on Dulera. for now.   Problem # 2:  G E R D (ICD-530.81) cont on dexilant and pepcid.   Her updated medication list for this problem includes:    Dexilant 60 Mg Cpdr (Dexlansoprazole) ..... One by mouth daily    Pepcid 20 Mg Tabs (Famotidine) .Marland Kitchen... 1 by mouth at bedtime  Medications Added to Medication List This Visit: 1)  Ms Contin 30 Mg Xr12h-tab (Morphine sulfate) .... Take 1 tablet by mouth three times a day 2)  Saphris 5 Mg Subl (Asenapine maleate) .Marland Kitchen.. 1 under tongue at bedtime 3)  Pepcid 20 Mg Tabs (Famotidine) .Marland Kitchen.. 1 by mouth at bedtime  Complete Medication List: 1)  Dulera 200-5 Mcg/act Aero (Mometasone furo-formoterol fum) .... 2 puffs twice per day 2)  Neurontin 400 Mg Caps (Gabapentin) .Marland Kitchen.. 1 by mouth four times daily 3)  Dexilant 60 Mg Cpdr (Dexlansoprazole) .... One by mouth daily 4)  Ms Contin 30 Mg Xr12h-tab (Morphine sulfate) .... Take 1 tablet by mouth three times a day 5)  Nabumetone 500 Mg Tabs (Nabumetone) .... Take 1 tablet by mouth two times a day 6)  Flexeril 10 Mg Tabs (Cyclobenzaprine hcl) .... As needed by mouth muscle spasms 7)  Epipen 0.3 Mg/0.92ml (1:1000) Devi (Epinephrine hcl (anaphylaxis)) .... For severe allergic reaction 8)  Ventolin Hfa 108 (90 Base) Mcg/act Aers (Albuterol sulfate) .... 2 puffs every 4 hours as needed 9)   Lamictal 200 Mg Tabs (Lamotrigine) .... Take 1 1/2 tab by mouth at bedtime 10)  Ambien 10 Mg Tabs (Zolpidem tartrate) .... 1/2 tab by mouth at bedtime as needed 11)  Saphris 5 Mg Subl (Asenapine maleate) .Marland Kitchen.. 1 under tongue at bedtime 12)  Alprazolam 0.25  Mg Tabs (Alprazolam) .... Take 1 tablet by mouth every 6 hours as needed 13)  Metoprolol Succinate 25 Mg Xr24h-tab (Metoprolol succinate) .... Take 1 tablet by mouth once a day 14)  Pepcid 20 Mg Tabs (Famotidine) .Marland Kitchen.. 1 by mouth at bedtime  Other Orders: Est. Patient Level IV (16109) DME Referral (DME)  Patient Instructions: 1)  We are setting you up for an overnight oximetry to check your oxygen while you sleep.  2)  follow up 2-3  weeks Dr. Delford Field w/ PFTs 3)  Take Elwin Sleight 2 puffs twice daily try not to  miss doses  4)  Please contact office for sooner follow up if symptoms do not improve or worsen  5)  Bring all your medicines-all the bottles, patches, herbs, etc so we can update your med list.     Appended Document: ambulatory oximetry documentation Ambulatory Pulse Oximetry  Resting; HR__91___    02 Sat__94___  Lap1 (185 feet)   HR__114___   02 Sat___93__ Lap2 (185 feet)   HR___116__   02 Sat__93___    Lap3 (185 feet)   HR_____   02 Sat_____  ___Test Completed without Difficulty __X_Test Stopped due to: increased fatigue   Boone Master CNA  October 11, 2009 5:10 PM    Clinical Lists Changes  Orders: Added new Service order of Pulse Oximetry, Ambulatory (60454) - Signed      Appended Document: NP follow up - med calendar I agree with this plan of care

## 2010-08-05 NOTE — Progress Notes (Signed)
Summary: ALLERGY  Phone Note Outgoing Call   Call placed by: T.Scott Call placed to: Patient Details for Reason: not compliant with allergy or xolair shots. Summary of Call: Mrs.Morrone got through her 1st set of allergy vac. & quit.(05-24-09) Her xolair shot she took it one time(06-04-09) and hasn't been back. We have called her serveral times with no answer and will send her a letter to ask if she plans to cont. either shot. Initial call taken by: Dimas Millin,  September 04, 2009 2:29 PM  Follow-up for Phone Call        If you don't get reponse by a week after mailing letter, then let me know and we will take these treatments off her med list. Follow-up by: Waymon Budge MD,  September 04, 2009 2:38 PM

## 2010-08-05 NOTE — Assessment & Plan Note (Signed)
Summary: Pulmonary/ acute post ER eval/ ? IBS/anxiety   Copy to:  [name], MD Primary Provider/Referring Provider:  Juleen Starr.  CC:  Acute visit.  Pt was seen at [facility] ER last night with increased SOB and left side chest and abdominal pain.  She was told that she may have possible blood clot b/c of elevated d-dimer.  She states that her CP only occurs upon inspration.  Gets SOB walking from room to room.  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    June 14, 2008- Previous pt of Dr. Delford Field, last seen > 5years ago. -Presents for post hospital follow up. Admitted 11/27-12/1/09 for status asthmaticus with vent dependent resp. failure. Pt has a history of  asthma since childhood who presented with 5-6 hours of progressive  dyspnea and required intubation upon arrival to the emergency  department,  treated with systemic  steroids, nebulized bronchodilators, mechanical ventilatory support, DVT  prophylaxis, stress ulcer prophylaxis, and glycemic control.  She  responded well to these therapies.  She extubated herself in the early  morning hours of June 03, 2008, without problems.    09/12/08- asthma flare- tx w/ steroids. referral to allergy and GI   10/10/08- Allergy evaluation- asthma- Tree pollen heavy now and distinctly bothering eyes and nose itch and drain. Feels wheezier. Here for allergy testing- msses antihistamines. Skin tests- Strongly POS grass, weed, tree, dust    Nov 09, 2008-- acute office viist. Complains of increased SOB, wheezing, prod cough with brownish-colored mucus onset last night - denies f/c/s. Assures  is taking all her meds w/o missed doses. Has seen some blood streaked mucousMarch 25, 2011 ccAcute visit.  Pt was seen at [facility] ER last night with increased SOB and left side chest and abdominal pain.  She was told  that she may have possible blood clot b/c of elevated d-dimer.  She states that her CP only occurs upon inspration.  Gets SOB walking from room to room = baseline.  new onset epigastric and LLQ pain x 1 week  better lying down and worse sitting.  cp worse with deep breath, abd pain worse with coughing.  no change in bowels better with xanax.  Never present supine.  No n or v. Pt denies any significant sore throat, dysphagia, itching, sneezing,  nasal congestion or excess secretions,  fever, chills, sweats, unintended wt loss,  lateralizing pleuritic or exertional cp, hempoptysis, change in activity tolerance  orthopnea pnd or leg swelling.  Pt also denies any obvious fluctuation in symptoms with weather or environmental change or other alleviating or aggravating factors.         Current Medications (verified): 1)  Dulera 200-5 Mcg/act Aero (Mometasone Furo-Formoterol Fum) .... 2 Puffs Twice Per Day 2)  Neurontin 400 Mg Caps (Gabapentin) .Marland Kitchen.. 1 By Mouth Four Times Daily 3)  Dexilant 60 Mg Cpdr (Dexlansoprazole) .... One By Mouth Daily 4)  Ms Contin 30 Mg Xr12h-Tab (Morphine Sulfate) .Marland Kitchen.. 1 By Mouth Two Times A Day 5)  Nabumetone 500 Mg Tabs (Nabumetone) .... Take 1 Tablet By Mouth Two Times A Day 6)  Flexeril 10 Mg Tabs (Cyclobenzaprine Hcl) .... As Needed By Mouth Muscle Spasms 7)  Epipen 0.3 Mg/0.45ml (1:1000) Devi (Epinephrine Hcl (Anaphylaxis)) .... For Severe Allergic Reaction 8)  Ventolin Hfa 108 (90 Base) Mcg/act Aers (Albuterol Sulfate) .... 2 Puffs Every 4  Hours As Needed 9)  Lamictal 200 Mg Tabs (Lamotrigine) .... Take 1 1/2 Tab By Mouth At Bedtime 10)  Ambien 10 Mg Tabs (Zolpidem Tartrate) .... 1/2 Tab By Mouth At Bedtime As Needed 11)  Saphria 5mg  .... At Bedtime 12)  Alprazolam 0.25 Mg Tabs (Alprazolam) .... Take 1 Tablet By Mouth Every 6 Hours As Needed 13)  Metoprolol Succinate 25 Mg Xr24h-Tab (Metoprolol Succinate) .... Take 1 Tablet By Mouth Once A Day  Allergies: 1)  ! Pcn 2)  !  Stadol 3)  ! Lasix 4)  ! Iodine 5)  ! Elavil 6)  ! * Shellfish 7)  ! * Latex  Past History:  Past Medical History:  Status Ashtmaticus--admission 06/01/08 with VDRF Allergic rhinoconjunctivitis- ------ Skin Test Pos 10/10/08 : Strongly POS grass, weed, tree, dust ?MS dx in 2004 in [place].  HYPERGLYCEMIA (ICD-790.29) Headache ANXIETY (ICD-300.00) DEPRESSION (ICD-311) BACK PAIN, CHRONIC (ICD-724.5)--chronic DDD on disablility ARTHRITIS (ICD-716.90) OBSTRUCTIVE SLEEP APNEA (ICD-327.23)- never CPAP.............Marland KitchenMarland KitchenDyke Brackett R D fibromyalgia Allergy- shellfish Anemia  Pneumovax --April 15, 2009  flu shot 04/2009  Vital Signs:  Patient profile:   50 year old female Weight:      269 pounds O2 Sat:      99 % on Room air Temp:     97.6 degrees F oral Pulse rate:   85 / minute BP sitting:   112 / 80  (left arm) Cuff size:   large  Vitals Entered By: Vernie Murders (September 27, 2009 4:06 PM)  O2 Flow:  Room air  Serial Vital Signs/Assessments:  Comments: 5:14 PM Ambulatory Pulse Oximetry  Resting; HR__83___    02 Sat__96%ra___  Lap1 (185 feet)   HR__101___   02 Sat__95%ra___ Lap2 (185 feet)   HR_____   02 Sat_____    Lap3 (185 feet)   HR_____   02 Sat_____  ___Test Completed without Difficulty Test stopped due to - Pt c/o SOB.  After walking for 1/2 lap her o2 sat dropped to 87%ra and then quickly increased back to 95%ra  By: Vernie Murders    Physical Exam  Additional Exam:  wt 241 > 269 September 27, 2009  Gen. Pleasant, well-nourished, in no distress, ? belle indifference affect ENT - no lesions, no post nasal drip, class 2 airway Neck: No JVD, no thyromegaly, no carotid bruits Lungs: no use of accessory muscles, no dullness to percussion, clear without rales or rhonchi  Cardiovascular: Rhythm regular, heart sounds  normal, no murmurs or gallops, no peripheral edema Abdomen: soft and non-tender, no hepatosplenomegaly, BS normal. Musculoskeletal: No deformities, no  cyanosis or clubbing Neuro:  alert, non focal     Impression & Recommendations:  Problem # 1:  ABDOMINAL PAIN, UNSPECIFIED (ICD-789.00)  Neg w/u in er for pulmonary emboli,  no evidence ihd.  In retrospect Classic subdiaphragmatic pain pattern suggests ibs:  daytime, not exacerbated by ex or coughing, worse in sitting position, associated with generalized abd bloating, not present supine due to the dome effect of the diaphram canceled in that position.   See instructions for specific recommendations   Orders: Est. Patient Level IV (74259)  Problem # 2:  DYSPNEA (ICD-786.09)   DDX of  difficult airways managment all start with A and  include Adherence, Ace Inhibitors, Acid Reflux, Active Sinus Disease, Alpha 1 Antitripsin deficiency, Anxiety masquerading as Airways dz,  ABPA,  allergy(esp in young), Aspiration (esp in elderly), Adverse effects of DPI,  Active smokers, plus one B  = Beta blocker use.Marland Kitchen  Adherence:   not taking dulera, not sure this is truly asthma but does have Allergy and Probably Acid reflux.   Each maintenance medication was reviewed in detail including most importantly the difference between maintenance and as needed and under what circumstances the prns are to be used.  To keep things simple, I have asked the patient to first separate medicines that are perceived as maintenance, that is to be taken daily "no matter what", from those medicines that are taken on only on an as-needed basis and I have given the patient examples of both, and then return to see our NP to generate a  detailed  medication calendar which should be followed until the next physician sees the patient and updates it.   Once we're sure that we're all reading from the same page in terms of medication admiistration, she needs to be scheduled to follow up with Dr Delford Field to regroup in terms of best maint rx.  Medications Added to Medication List This Visit: 1)  Ms Contin 30 Mg Xr12h-tab (Morphine sulfate)  .Marland Kitchen.. 1 by mouth two times a day  Other Orders: TLB-BNP (B-Natriuretic Peptide) ([id number]) TLB-Sedimentation Rate (ESR) (85652-ESR) TLB-Cardiac Panel ([id number]) Pulse Oximetry, Ambulatory (32951)  Patient Instructions: 1)  Continue dexlilant 60 Take  one 30-60 min before first meal of the day and add pepcid 20 mg at bedtime 2)  use Xanax .25 mg  1-2 every 6 hours for pain 3)  start citrcel 1 tsp twice daily with glass of water 4)  avoid foods that cause gas 5)  See Tammy NP w/in 2 weeks with all your medications, even over the counter meds, separated in two separate bags, the ones you take no matter what vs the ones you stop once you feel better and take only as needed.  She will generate for you a new user friendly medication calendar that will put [place] all on the same page re: your medication use and she will set you back up with Dr Delford Field

## 2010-08-05 NOTE — Consult Note (Signed)
Summary: Eval/[facility]  Eval/[facility]   Imported By: Lester Wikieup 06/04/2009 09:30:15  _____________________________________________________________________  External Attachment:    Type:   Image     Comment:   External Document

## 2010-08-05 NOTE — Progress Notes (Signed)
Summary: appointments  Phone Note Call from Patient   Caller: Patient Call For: parrett Summary of Call: pt need to talk to nurse about neurology appointment. Initial call taken by: Rickard Patience,  February 17, 2010 3:26 PM  Follow-up for Phone Call        Called, spoke with pt.  Pt states she received two calls, one from a kidney doctor and the other for an appt at the ER on Wednesday.  States she was calling to clarrify the appt dates/times/locations.  Advised pt there is nothing in our system for these appts that she needs to call those doctors for that information.  States she did not know the number or who the doctors where.  Advised her to look on the caller id and call the numbers back that called her--our office does not have a record of these appt for her.  Follow-up by: Gweneth Dimitri RN,  February 17, 2010 4:11 PM

## 2010-08-05 NOTE — Assessment & Plan Note (Signed)
Summary: Pulmonary OV   Copy to:  [name], MD Primary Provider/Referring Provider:  Juleen Starr.  CC:  Follow up.  Pt states breathing is some better.  Wheezing on "extremely hot days."  Chest tightness occ at bedtime.  Denies cough.  .  History of Present Illness: Pulmonary OV  318/10- 50yoF seen in allergy consult at kind request of Dr Delford Field, asking evaluation to help management of asthma.  Required intubation 11/09. gives hx of seasonal allergic rhinits and was skin test positive in 1996 to grass and dust, but never on allergy v accine. States [name] has had "allergy and asthma since birth". Current difficult interval began 5 months ago. Triggers include cold and hot weather, strong odors and cheimical smells. [name] also avoids shell fish which may cause wheeze. Latex associated with rash and chest tightness.  Notes much nose and sinus area pressure discomfort, ears hurt, post nasal drip. denies fever or purulent discharge, adenopathy, nausea or vomiting/ Environment- Older house, basement, window AC, gas heat, [name] bedroom floor- hardwood, no indoor pets. Brother lives there and smokes outside. Denies mold/ mildew.  10/10/08- Allergy evaluation- asthma Tree pollen heavy now and distinctly bothering eyes and nose itch and drain. Feels wheezier. Here for allergy testing- msses antihistamines.  12/03/09 nAllergic asthma Skin tests- Strongly POS grass, weed, tree, dust last year. Asks about restarting allergy shots. [name] had barely started build-up here last year, but stopped because [name] thought they might be causing rash on wrist and ankles. Also thought [name] got tight in chest, like [name] asthma, 2 days after  allergy shots. Xolair given once- too expensive. Other treatments aren't working well enough so [name] wants another look at allergy vaccine. Feels well today Denies much nasal congestion but did have sneeze and itch this Spring. Started home O2 for sleep at 2 l/M and says this helps  [name] sleep. September 27, 2009 ccAcute visit.  Pt was seen at [facility] ER last night with increased SOB and left side chest and abdominal pain.  [name] was told that [name] may have possible blood clot b/c of elevated d-dimer.  [name] states that [name] CP only occurs upon inspration.  Gets SOB walking from room to room = baseline.  new onset epigastric and LLQ pain x 1 week  better lying down and worse sitting.  cp worse with deep breath, abd pain worse with coughing.  no change in bowels better with xanax.  Never present supine.  No n or v.   October 11, 2009--Presents for a follow up and med reivew. est med calendar - pt did not bring [name] meds with [name] today.  pt c/o pain on left side at groin area, SOB and "some wheezing" x1week. Last visit had atypical chest/abd pain suspected to be IBS. Cardiac enzymes neg. Neg w/up for PE and ESR and BNP normal. [name] has had an extensive work up for dyspnea/DOE/ and atypical chest pain. over last 6 months [name] has had numerous chest xray-NAD , CT chest-neg for PE and VQ scan x 2 w/  low probalbility for PE. 2 D ECHO 10/10 showed nml LVF w/ EF at 55-60% no PFO noted. Sleep test showed only mild OSA -no CPAP indicated. Last visit [name] did desat with walking but with quick recovery. Unfortuanetly did not bring in meds today. We reviewed [name] list and [name] is on multiple narcotics, pschy meds.   October 29, 2009 10:24 AM The pt continues with dyspnea and uses ventolin every 4 hours.  [name] feels the dulera helped.  [name] is on the [place].  [name] has been back and forth to ED for wheezing and chest pain.  [name] had neg w/u for PE and MI.  See data from 4/8 NP OV.  [name] has not followed through on Xolair and allergy immunotherapy due to inability to afford injection copay but now has medicaid and is willing to try again with both therapies. No cpap needed on sleep study.   Nov 05, 2009 --Presents for acute office visit. Complains of increased SOB, wheezing and dizziness onset last night - denies cough, f/c/s -  currently taking 2 tabs daily 10mg  pred taper. Prednisone was helping until went down on lower dose, last night wheezing returned w/ increase albuterol use. last month at er - xray neg, VQ low prob. , CT 9.10,neg for pulmonary emboli. Denies chest pain, orthopnea, hemoptysis, fever, n/v/d, edema, headache. Not taking dexilant, cant afford.    January 22, 2010 12:01 PM Last seen by PW 4/11.  Saw NP in May twice .   Not able to get allergy shots due to transportation Lately doing ok,  Proair now instead of ventolin.  The pt started wheezing again and now using proair:  using proair twice a day now.  off pred for 3 weeks.  The pt became worse off pred.  The pt cannot afford xolair. The pt cannot  come regularly for allergy immunotherapy.   Asthma History    Asthma Control Assessment:    Age range: 12+ years    Symptoms: throughout the day    Nighttime Awakenings: 4 or more/week    Interferes w/ normal activity: extreme limitations    SABA use (not for EIB): several times per day    ATAQ questionnaire: 3-4    FEV1: 2.23 liters (today)    FEV1 Pred: 3.06 liters (today)    Exacerbations requiring oral systemic steroids: 0-1/year    Asthma Control Assessment: Very Poorly Controlled   Preventive Screening-Counseling & Management  Alcohol-Tobacco     Smoking Status: never  Current Medications (verified): 1)  Neurontin 600 Mg Tabs (Gabapentin) .... Take 1 Tablet By Mouth Three Times A Day 2)  Ms Contin 30 Mg Xr12h-Tab (Morphine Sulfate) .... Every 8 Hours 3)  Nabumetone 500 Mg Tabs (Nabumetone) .... Take 1 Tablet By Mouth Two Times A Day As Needed 4)  Flexeril 10 Mg Tabs (Cyclobenzaprine Hcl) .... Three Times A Day 5)  Epipen 0.3 Mg/0.42ml (1:1000) Devi (Epinephrine Hcl (Anaphylaxis)) .... For Severe Allergic Reaction 6)  Ventolin Hfa 108 (90 Base) Mcg/act Aers (Albuterol Sulfate) .... 2 Puffs Every 4 Hours As Needed 7)  Lamotrigine 100 Mg Tabs (Lamotrigine) .... 2 Tabs At Bedtime 8)  Ambien 10  Mg Tabs (Zolpidem Tartrate) .Marland Kitchen.. 1 Tab By Mouth At Bedtime As Needed 9)  Saphris 5 Mg Subl (Asenapine Maleate) .Marland Kitchen.. 1 Under Tongue At Bedtime 10)  Alprazolam 0.25 Mg Tabs (Alprazolam) .... Take 1 Tablet By Mouth Every 6 Hours As Needed 11)  Metoprolol Succinate 25 Mg Xr24h-Tab (Metoprolol Succinate) .... Take 1 Tablet By Mouth Once A Day 12)  Pepcid 20 Mg Tabs (Famotidine) .Marland Kitchen.. 1 By Mouth in Am and  At Bedtime 13)  Citrucel  Powd (Methylcellulose (Laxative)) .... Two Times A Day 14)  Bupropion Hcl 300 Mg Xr24h-Tab (Bupropion Hcl) .... Once Daily 15)  Zaleplon 10 Mg Caps (Zaleplon) .... At Bedtime As Needed 16)  Cymbalta 60 Mg Cpep (Duloxetine Hcl) .... Once Daily 17)  Spiriva Handihaler  18 Mcg Caps (Tiotropium Bromide Monohydrate) .Marland Kitchen.. 1 Puff Once Daily 18)  Allergy Vaccine New Start  Allergies (verified): 1)  ! Pcn 2)  ! Stadol 3)  ! Lasix 4)  ! Iodine 5)  ! Elavil 6)  ! * Shellfish 7)  ! * Latex  Past History:  Past medical, surgical, family and social histories (including risk factors) reviewed, and no changes noted (except as noted below).  Past Medical History: Reviewed history from 10/29/2009 and no changes required.  Status Ashtmaticus--admission 06/01/08 with VDRF Allergic rhinoconjunctivitis- ------ Skin Test Pos 10/10/08 : Strongly POS grass, weed, tree, dust ?MS dx in 2004 in [place].  HYPERGLYCEMIA (ICD-790.29) Headache ANXIETY (ICD-300.00) DEPRESSION (ICD-311) BACK PAIN, CHRONIC (ICD-724.5)--chronic DDD on disablility ARTHRITIS (ICD-716.90) OBSTRUCTIVE SLEEP APNEA (ICD-327.23)- never CPAP  not indicated on Sleep study.............Marland KitchenMarland KitchenDyke Brackett R D fibromyalgia Allergy- shellfish Anemia  Pneumovax --April 15, 2009  flu shot 04/2009  Past Surgical History: Reviewed history from 09/20/2008 and no changes required. Appendectomy in 1987 C-section in 1988 Cholecystectomy in 1990 Foot surgery in 1995 Tubal ligation Hysterectomy in 2000 hernia repair  Family  History: Reviewed history from 05/13/2009 and no changes required. Family History Ovarian Cancer--mother ( deceased at age28) Father deceased in his 65's of unknown causes. many with "allergy" Family History of Heart Disease: Mother, Father-deceased MI, siblings  Social History: Reviewed history from 05/13/2009 and no changes required. 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 2 children Patient never smoked.  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  The patient denies productive cough, coughing up blood, chest pain, irregular heartbeats, acid heartburn, indigestion, loss of appetite, weight change, abdominal pain, difficulty swallowing, sore throat, tooth/dental problems, headaches, nasal congestion/difficulty breathing through nose, sneezing, itching, ear ache, anxiety, depression, hand/feet swelling, joint stiffness or pain, rash, change in color of mucus, and fever.    Vital Signs:  Patient profile:   50 year old female Height:      69 inches Weight:      281 pounds BMI:     41.65 O2 Sat:      92 % on Room air Temp:     98.3 degrees F oral Pulse rate:   84 / minute BP sitting:   132 / 80  (right arm) Cuff size:   large  Vitals Entered By: Gweneth Dimitri RN (January 22, 2010 11:48 AM)  O2 Flow:  Room air CC: Follow up.  Pt states breathing is some better.  Wheezing on "extremely hot days."  Chest tightness occ at bedtime.  Denies cough.   Comments Medications reviewed with patient Daytime contact number verified with patient. Gweneth Dimitri RN  January 22, 2010 11:48 AM    Physical Exam  Additional Exam:  Gen.obese , in no distress ENT - no lesions, no post nasal drip, class 2 airway Neck: No JVD, no thyromegaly, no carotid bruits Lungs: coarse BS , faint  wheeze, poor airflow Cardiovascular: Rhythm regular, heart sounds  normal, no murmurs or gallops, no peripheral edema Abdomen: soft and non-tender, no hepatosplenomegaly, BS normal. Musculoskeletal: No deformities, no cyanosis or clubbing Neuro:  alert, non focal     Pre-Spirometry FEV1    Value: 2.23 L     Pred: 3.06 L     Impression & Recommendations:  Problem # 1:  ASTHMA (ICD-493.90) Assessment Deteriorated Severe atopic persistent asthma, not able to pay for xolair.  Pt cannot get transportation to reliably come for allergy immunotherapy, pt with ongoing airway inflammation plan pulse prednisone try to get back on allergy shots cont maintainence inhaled meds  Medications Added to Medication List This Visit: 1)  Nabumetone 500 Mg Tabs (Nabumetone) .... Take 1 tablet by mouth two times a day as needed 2)  Lamotrigine 100 Mg Tabs (Lamotrigine) .... 2 tabs at bedtime 3)  Pepcid 20 Mg Tabs (Famotidine) .Marland Kitchen.. 1 by mouth in am and  at bedtime 4)  Advair Diskus 250-50 Mcg/dose Misc (Fluticasone-salmeterol) .... One puff twice daily 5)  Prednisone 10 Mg Tabs (Prednisone) .... Take as directed 4 each am x3days, 3 x 3days, 2 x 3days, 1 x 3days then stop  Complete Medication List: 1)  Neurontin 600 Mg Tabs (Gabapentin) .... Take 1 tablet by mouth three times a day 2)  Ms Contin 30 Mg Xr12h-tab (Morphine sulfate) .... Every 8 hours 3)  Nabumetone 500 Mg Tabs (Nabumetone) .... Take 1 tablet by mouth two times a day as needed 4)  Flexeril 10 Mg Tabs (Cyclobenzaprine hcl) .... Three times a day 5)  Epipen 0.3 Mg/0.22ml (1:1000) Devi (Epinephrine hcl (anaphylaxis)) .... For severe allergic reaction 6)  Ventolin Hfa 108 (90 Base) Mcg/act Aers (Albuterol sulfate) .... 2 puffs every 4 hours as needed 7)  Lamotrigine 100 Mg Tabs (Lamotrigine) .... 2 tabs at bedtime 8)  Ambien 10 Mg Tabs (Zolpidem tartrate) .Marland Kitchen.. 1 tab by mouth at bedtime as needed 9)  Saphris 5 Mg Subl (Asenapine maleate) .Marland Kitchen.. 1  under tongue at bedtime 10)  Alprazolam 0.25 Mg Tabs (Alprazolam) .... Take 1 tablet by mouth every 6 hours as needed 11)  Metoprolol Succinate 25 Mg Xr24h-tab (Metoprolol succinate) .... Take 1 tablet by mouth once a day 12)  Pepcid 20 Mg Tabs (Famotidine) .Marland Kitchen.. 1 by mouth in am and  at bedtime 13)  Citrucel Powd (Methylcellulose (laxative)) .... Two times a day 14)  Bupropion Hcl 300 Mg Xr24h-tab (Bupropion hcl) .... Once daily 15)  Zaleplon 10 Mg Caps (Zaleplon) .... At bedtime as needed 16)  Cymbalta 60 Mg Cpep (Duloxetine hcl) .... Once daily 17)  Spiriva Handihaler 18 Mcg Caps (Tiotropium bromide monohydrate) .Marland Kitchen.. 1 puff once daily 18)  Allergy Vaccine New Start  19)  Advair Diskus 250-50 Mcg/dose Misc (Fluticasone-salmeterol) .... One puff twice daily 20)  Prednisone 10 Mg Tabs (Prednisone) .... Take as directed 4 each am x3days, 3 x 3days, 2 x 3days, 1 x 3days then stop  Other Orders: Est. Patient Level IV (57846)  Patient Instructions: 1)  Prednisone 10mg  4 each am x3days, 3 x 3days, 2 x 3days, 1 x 3days then stop 2)  No other changes 3)  Consider restarting allergy shots 4)  Return 2 months Prescriptions: PREDNISONE 10 MG  TABS (PREDNISONE) Take as directed 4 each am x3days, 3 x 3days, 2 x 3days, 1 x 3days then stop  #30 x 0   Entered and Authorized by:   Storm Frisk MD   Signed by:   Storm Frisk MD on 01/22/2010   Method used:   Electron[facility] to        [facility]  [address] [phone][facility] (retail)       [address]       [place], [place]  [id number]  Ph: [phone] or [phone]       Fax: [phone]   RxID:   [id number]

## 2010-08-05 NOTE — Miscellaneous (Signed)
Summary: Alprazolam Rx  Clinical Lists Changes  Medications: Rx of ALPRAZOLAM 0.25 MG TABS (ALPRAZOLAM) Take 1 tablet by mouth every 6 hours as needed;  #90 x 3;  Signed;  Entered by: Gweneth Dimitri RN;  Authorized by: Storm Frisk MD;  Method used: Historical    Prescriptions: ALPRAZOLAM 0.25 MG TABS (ALPRAZOLAM) Take 1 tablet by mouth every 6 hours as needed  #90 x 3   Entered by:   Gweneth Dimitri RN   Authorized by:   Storm Frisk MD   Signed by:   Gweneth Dimitri RN on 08/07/2009   Method used:   Historical   RxID:   [id number]  [facility]ill [facility] from [facility].  Request signed by PW and faxed back to pharmacy.  Gweneth Dimitri RN  August 07, 2009 11:29 AM

## 2010-08-05 NOTE — Discharge Summary (Signed)
Summary: Hospital Discharge                    [place]  Patient:    [name], [name]                      MRN: [id number] Adm. Date:  [id number] Disc. Date: [id number] Attending:  Orpah Cobb S                           Discharge Summary  PRINCIPAL DIAGNOSES:  1. Chest pain.  2. Reflux esophagitis.  3. Hiatal hernia.  4. Anxiety.  PRINCIPAL PROCEDURE:  1. Left heart catheterization.  Selected _________ angiography, left renal     function study, and descending aortography done by Dr. Orpah Cobb on     September 16, 1999.  2. Upper GI series done on September 16, 1999 showing hiatal hernia and reflux     esophagitis.  3. CT of the brain done on September 16, 1999 without any significant findings.  DISCHARGE MEDICATIONS:  1. Prevacid 30 mg 1 p.o. daily.  2. Toprol XL 50 mg 1/2 tablet twice daily.  3. Xanax 0.25 mg 1 daily.  DISCHARGE DIET:  Low fat, low salt diet as tolerated.  DISCHARGE ACTIVITY:  As tolerated.  SPECIAL INSTRUCTIONS:  The patient to notify physician if she has right groin pain, swelling, or discharge.  FOLLOWUP:  Follow-up appointment by Dr. Orpah Cobb in two weeks.  CONDITION ON DISCHARGE:  Stable.  HISTORY:  This 50 year old black female complained of chest pain radiating to left shoulder and arm area, along with a positive treadmill stress test.  The patient also complained of left-sided numbness without speech and vision disturbance.  PHYSICAL EXAMINATION:  VITAL SIGNS:  Temperature 98.6, pulse 86, respirations 18, blood pressure 112/76. Height 5 feet 9 inches.  Weight 195 pounds.  GENERAL:  The patient is alert and oriented x 3.  HEENT:  The head is normocephalic, atraumatic.  Eyes brown with pupils reacting to light.  Extraocular movement intact.  Conjunctivae pink.  Sclerae nonicteric. Ears, nose and throat:  Mucous membranes pink and moist.  NECK:  No JVD, no carotid bruit.  LUNGS:  Clear to auscultation.  HEART:   Normal, S1 and S2.  ABDOMEN:  Soft and nontender.  EXTREMITIES:  No edema, cyanosis, clubbing.  CENTRAL NERVOUS SYSTEM:  Cranial nerves II-XII grossly intact.  Bilateral, equal grips.  Normal ambulation.  LABORATORY DATA:  Normal WBC count, platelet count.  Borderline hemoglobin and hematocrit.  Normal electrolytes, BUN, and creatinine.  Normal blood sugar. Normal PT/PTT.  Normal CK-MBs with a high total CK.  EKG showed normal sinus rhythm with frequent premature atrial complex and chest  x-ray was without any acute disease.  Heart size was normal.  CT of the brain was unremarkable.  Upper GI series showed hiatal hernia and reflux esophagitis. Left heart catheterization showed normal coronaries and a normal left systolic function.   HOSPITAL COURSE:  The patient was admitted to telemetry and myocardial infarction was ruled out.  She underwent cardiac catheterization that was with normal coronaries and normal left systolic function.  She had upper GI series that showed sliding hiatal hernia and reflux esophagitis.  She also underwent a CT of the brain that was unremarkable.  The patient was started on Prevacid, Toprol XL to control heart rate variability, and Xanax for anxiety.  CONDITION ON DISCHARGE:  She  was discharged home in satisfactory condition.  FOLLOWUP:  Followup by me in two weeks. DD:  09/17/99 TD:  09/17/99 Job: [id number]

## 2010-08-05 NOTE — Assessment & Plan Note (Signed)
Summary: Pulmonary OV   Copy to:  [name], MD Primary Provider/Referring Provider:  Juleen Starr.  CC:  2 wk follow up.  States breathing is getting worse x3wks.  Pt states she is still having SOB with activity but states she now has to stop to take breaks more frequently and using ventolin q4h.  Pt states she also has chest tightness with activity and some wheezing qhs.  Pt c/o prod cough with yellow mucus x 2wks.  .  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    June 14, 2008- Previous pt of Dr. Delford Field, last seen > 5years ago. -Presents for post hospital follow up. Admitted 11/27-12/1/09 for status asthmaticus with vent dependent resp. failure. Pt has a history of  asthma since childhood who presented with 5-6 hours of progressive  dyspnea and required intubation upon arrival to the emergency  department,  treated with systemic  steroids, nebulized bronchodilators, mechanical ventilatory support, DVT  prophylaxis, stress ulcer prophylaxis, and glycemic control.  She  responded well to these therapies.  She extubated herself in the early  morning hours of June 03, 2008, without problems.    09/12/08- asthma flare- tx w/ steroids. referral to allergy and GI   10/10/08- Allergy evaluation- asthma- Tree pollen heavy now and distinctly bothering eyes and nose itch and drain. Feels wheezier. Here for allergy testing- msses antihistamines. Skin tests- Strongly POS grass, weed, tree, dust    Nov 09, 2008-- acute office viist. Complains of increased SOB, wheezing, prod cough with brownish-colored mucus onset last night - denies f/c/s. Assures  is taking all her meds w/o missed doses. Has seen some blood streaked mucous    September 27, 2009 ccAcute visit.  Pt was seen at [facility] ER last night with increased SOB and left side chest and abdominal pain.   She was told that she may have possible blood clot b/c of elevated d-dimer.  She states that her CP only occurs upon inspration.  Gets SOB walking from room to room = baseline.  new onset epigastric and LLQ pain x 1 week  better lying down and worse sitting.  cp worse with deep breath, abd pain worse with coughing.  no change in bowels better with xanax.  Never present supine.  No n or v.   October 11, 2009--Presents for a follow up and med reivew. est med calendar - pt did not bring her meds with her today.  pt c/o pain on left side at groin area, SOB and "some wheezing" x1week. Last visit had atypical chest/abd pain suspected to be IBS. Cardiac enzymes neg. Neg w/up for PE and ESR and BNP normal. She has had an extensive work up for dyspnea/DOE/ and atypical chest pain. over last 6 months she has had numerous chest xray-NAD , CT chest-neg for PE and VQ scan x 2 w/  low probalbility for PE. 2 D ECHO 10/10 showed nml LVF w/ EF at 55-60% no PFO noted. Sleep test showed only mild OSA -no CPAP indicated. Last visit she did desat with walking but with quick recovery. Unfortuanetly did not bring in meds today. We reviewed her list and she is on multiple narcotics, pschy meds.   October 29, 2009 10:24 AM The pt continues with dyspnea and uses ventolin every 4 hours.  She feels the dulera helped.  She is on the [place].  She has been back and forth to ED for wheezing and chest pain.  She had neg w/u for PE and MI.  See data from 4/8 NP OV.  She has not followed through on Xolair and allergy immunotherapy due to inability to afford injection copay but now has medicaid and is willing to try again with both therapies. No cpap needed on sleep study.  Asthma History    Asthma Control Assessment:    Age range: 12+ years    Symptoms: throughout the day    Nighttime Awakenings: 1-3/week    Interferes w/ normal activity: some limitations    SABA use (not for EIB): several times per day    ATAQ questionnaire: 3-4     FEV1: 2.23 liters (today)    FEV1 Pred: 3.06 liters (today)    Exacerbations requiring oral systemic steroids: 2 or more/year    Asthma Control Assessment: Very Poorly Controlled   Preventive Screening-Counseling & Management  Alcohol-Tobacco     Smoking Status: never  Current Medications (verified): 1)  Dulera 200-5 Mcg/act Aero (Mometasone Furo-Formoterol Fum) .... 2 Puffs Twice Per Day 2)  Neurontin 600 Mg Tabs (Gabapentin) .... Two Times A Day 3)  Dexilant 60 Mg Cpdr (Dexlansoprazole) .... One By Mouth Daily 4)  Ms Contin 30 Mg Xr12h-Tab (Morphine Sulfate) .... Every 8 Hours 5)  Nabumetone 500 Mg Tabs (Nabumetone) .... Take 1 Tablet By Mouth Two Times A Day 6)  Flexeril 10 Mg Tabs (Cyclobenzaprine Hcl) .... Three Times A Day 7)  Epipen 0.3 Mg/0.75ml (1:1000) Devi (Epinephrine Hcl (Anaphylaxis)) .... For Severe Allergic Reaction 8)  Ventolin Hfa 108 (90 Base) Mcg/act Aers (Albuterol Sulfate) .... 2 Puffs Every 4 Hours As Needed 9)  Lamictal 100 Mg Tabs (Lamotrigine) .... 3 At Bedtime 10)  Ambien 10 Mg Tabs (Zolpidem Tartrate) .Marland Kitchen.. 1 Tab By Mouth At Bedtime As Needed 11)  Saphris 5 Mg Subl (Asenapine Maleate) .Marland Kitchen.. 1 Under Tongue At Bedtime 12)  Alprazolam 0.25 Mg Tabs (Alprazolam) .... Take 1 Tablet By Mouth Every 6 Hours As Needed 13)  Metoprolol Succinate 25 Mg Xr24h-Tab (Metoprolol Succinate) .... Take 1 Tablet By Mouth Once A Day 14)  Pepcid 20 Mg Tabs (Famotidine) .Marland Kitchen.. 1 By Mouth At Bedtime 15)  Citrucel  Powd (Methylcellulose (Laxative)) .... Two Times A Day 16)  Bupropion Hcl 300 Mg Xr24h-Tab (Bupropion Hcl) .... Once Daily 17)  Zaleplon 10 Mg Caps (Zaleplon) .... At Bedtime As Needed 18)  Cymbalta 60 Mg Cpep (Duloxetine Hcl) .... Once Daily  Allergies (verified): 1)  ! Pcn 2)  ! Stadol 3)  ! Lasix 4)  ! Iodine 5)  ! Elavil 6)  ! * Shellfish 7)  ! * Latex  Past History:  Past medical, surgical, family and social histories (including risk factors) reviewed, and no  changes noted (except as noted below).  Past Medical History:  Status Ashtmaticus--admission 06/01/08 with VDRF Allergic rhinoconjunctivitis- ------ Skin Test Pos 10/10/08 : Strongly POS grass, weed, tree, dust ?MS dx in 2004 in [place].  HYPERGLYCEMIA (ICD-790.29) Headache ANXIETY (ICD-300.00) DEPRESSION (ICD-311) BACK PAIN, CHRONIC (ICD-724.5)--chronic DDD on disablility ARTHRITIS (ICD-716.90) OBSTRUCTIVE SLEEP APNEA (ICD-327.23)- never CPAP  not indicated on Sleep study.............Marland KitchenMarland KitchenDyke Brackett R D fibromyalgia Allergy- shellfish Anemia  Pneumovax --April 15, 2009  flu shot 04/2009  Past Surgical History: Reviewed history from 09/20/2008 and no changes required. Appendectomy in 1987 C-section in 1988 Cholecystectomy in 1990 Foot surgery in 1995 Tubal ligation Hysterectomy in 2000 hernia repair  Family History: Reviewed  history from 05/13/2009 and no changes required. Family History Ovarian Cancer--mother ( deceased at age28) Father deceased in his 52's of unknown causes. many with "allergy" Family History of Heart Disease: Mother, Father-deceased MI, siblings  Social History: Reviewed history from 05/13/2009 and no changes required. 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 2 children Patient never smoked.  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  The patient denies shortness of breath at rest, productive cough, coughing up blood, chest pain, irregular heartbeats, acid heartburn, indigestion, loss of appetite, weight change, abdominal pain, difficulty swallowing, sore throat, tooth/dental problems, headaches, nasal congestion/difficulty breathing through nose, sneezing, itching, ear ache, anxiety, depression, hand/feet swelling, joint stiffness or pain, rash, change in color of mucus, and  fever.    Vital Signs:  Patient profile:   50 year old female Height:      69 inches Weight:      277.13 pounds BMI:     41.07 O2 Sat:      94 % on Room air Temp:     98.4 degrees F oral Pulse rate:   96 / minute BP sitting:   122 / 86  (right arm) Cuff size:   large  Vitals Entered By: Gweneth Dimitri RN (October 29, 2009 10:12 AM)  O2 Flow:  Room air CC: 2 wk follow up.  States breathing is getting worse x3wks.  Pt states she is still having SOB with activity but states she now has to stop to take breaks more frequently and using ventolin q4h.  Pt states she also has chest tightness with activity and some wheezing qhs.  Pt c/o prod cough with yellow mucus x 2wks.   Comments Medications reviewed with patient Daytime contact number verified with patient. Gweneth Dimitri RN  October 29, 2009 10:13 AM    Physical Exam  Additional Exam:  Gen. Pleasant, well-nourished, in no distress ENT - no lesions, no post nasal drip, class 2 airway Neck: No JVD, no thyromegaly, no carotid bruits Lungs: no use of accessory muscles, no dullness to percussion, clear without rales or rhonchi  Cardiovascular: Rhythm regular, heart sounds  normal, no murmurs or gallops, no peripheral edema Abdomen: soft and non-tender, no hepatosplenomegaly, BS normal. Musculoskeletal: No deformities, no cyanosis or clubbing Neuro:  alert, non focal     Pre-Spirometry FEV1    Value: 2.23 L     Pred: 3.06 L     Impression & Recommendations:  Problem # 1:  EXTRINSIC ASTHMA, UNSPECIFIED (ICD-493.00) Assessment Deteriorated Severe persistent asthma with atopic features  plan reattempt to get on xolair pulse prednisone today refer back to allergy for immunotherapy no other medication changes  Medications Added to Medication List This Visit: 1)  Neurontin 600 Mg Tabs (Gabapentin) .... Two times a day 2)  Ms Contin 30 Mg Xr12h-tab (Morphine sulfate) .... Every 8 hours 3)  Flexeril 10 Mg Tabs (Cyclobenzaprine hcl)  .... Three times a day 4)  Lamictal 100 Mg Tabs (Lamotrigine) .... 3 at bedtime 5)  Ambien 10 Mg Tabs (Zolpidem tartrate) .Marland Kitchen.. 1 tab by mouth at bedtime as needed 6)  Citrucel Powd (Methylcellulose (laxative)) .... Two times a day 7)  Bupropion Hcl 300 Mg Xr24h-tab (Bupropion hcl) .... Once daily 8)  Zaleplon 10 Mg Caps (Zaleplon) .... At bedtime as needed 9)  Cymbalta 60 Mg Cpep (Duloxetine hcl) .... Once daily 10)  Prednisone 10 Mg Tabs (Prednisone) .... Take as directed 4 each am x 4 days, 3 x 4 days, 2 x 4 days, 1 x 4 days then stop  Complete Medication List: 1)  Dulera 200-5 Mcg/act Aero (Mometasone furo-formoterol fum) .... 2 puffs twice per day 2)  Neurontin 600 Mg Tabs (Gabapentin) .... Two times a day 3)  Dexilant 60 Mg Cpdr (Dexlansoprazole) .... One by mouth daily 4)  Ms Contin 30 Mg Xr12h-tab (Morphine sulfate) .... Every 8 hours 5)  Nabumetone 500 Mg Tabs (Nabumetone) .... Take 1 tablet by mouth two times a day 6)  Flexeril 10 Mg Tabs (Cyclobenzaprine hcl) .... Three times a day 7)  Epipen 0.3 Mg/0.71ml (1:1000) Devi (Epinephrine hcl (anaphylaxis)) .... For severe allergic reaction 8)  Ventolin Hfa 108 (90 Base) Mcg/act Aers (Albuterol sulfate) .... 2 puffs every 4 hours as needed 9)  Lamictal 100 Mg Tabs (Lamotrigine) .... 3 at bedtime 10)  Ambien 10 Mg Tabs (Zolpidem tartrate) .Marland Kitchen.. 1 tab by mouth at bedtime as needed 11)  Saphris 5 Mg Subl (Asenapine maleate) .Marland Kitchen.. 1 under tongue at bedtime 12)  Alprazolam 0.25 Mg Tabs (Alprazolam) .... Take 1 tablet by mouth every 6 hours as needed 13)  Metoprolol Succinate 25 Mg Xr24h-tab (Metoprolol succinate) .... Take 1 tablet by mouth once a day 14)  Pepcid 20 Mg Tabs (Famotidine) .Marland Kitchen.. 1 by mouth at bedtime 15)  Citrucel Powd (Methylcellulose (laxative)) .... Two times a day 16)  Bupropion Hcl 300 Mg Xr24h-tab (Bupropion hcl) .... Once daily 17)  Zaleplon 10 Mg Caps (Zaleplon) .... At bedtime as needed 18)  Cymbalta 60 Mg Cpep  (Duloxetine hcl) .... Once daily 19)  Prednisone 10 Mg Tabs (Prednisone) .... Take as directed 4 each am x 4 days, 3 x 4 days, 2 x 4 days, 1 x 4 days then stop  Other Orders: Est. Patient Level IV (75643) Prescription Created Electronically [phone]) Misc. Referral (Misc. Ref)  Patient Instructions: 1)  Prednisone pulse will be given, sent to pharmacy 2)  Attempt to work with you on xolair will be made 3)  No other medication changes 4)  Return one month Prescriptions: PREDNISONE 10 MG  TABS (PREDNISONE) Take as directed 4 each am x 4 days, 3 x 4 days, 2 x 4 days, 1 x 4 days then stop  #40 x 0   Entered and Authorized by:   Storm Frisk MD   Signed by:   Storm Frisk MD on 10/29/2009   Method used:   Electronically to        [facility]  [address] [phone]* (retail)       [address]       [place], [place]  [id number]       Ph: [phone] or [phone]       Fax: [phone]   RxID:   [id number]   Appended Document: Pulmonary OV fax Kathe Mariner

## 2010-08-05 NOTE — Progress Notes (Signed)
Summary: Pt NOS speech therapy  Phone Note Other Incoming Call back at [phone]   Caller: [facility]---Laura Summary of Call: Vernona Rieger called from [facility] to let Dr. Delford Field know the pt did not show up for her appt this morning, Tuesday, 04/30/09. Initial call taken by: Michel Bickers CMA,  April 30, 2009 11:24 AM  Follow-up for Phone Call        noted   Follow-up by: Storm Frisk MD,  April 30, 2009 1:34 PM

## 2010-08-05 NOTE — Medication Information (Signed)
Summary: Tax adviser   Imported By: Valinda Hoar 04/08/2009 16:45:29  _____________________________________________________________________  External Attachment:    Type:   Image     Comment:   External Document

## 2010-08-05 NOTE — Progress Notes (Signed)
Summary: nos appt  Phone Note Call from Patient   Caller: [email or web address]  Call For: [name] Summary of Call: In ref to nos from 8/12, pt states she doesn't need to rsc appt. Initial call taken by: Darletta Moll,  February 17, 2010 3:58 PM

## 2010-08-05 NOTE — Procedures (Signed)
Summary: Oximetry/IDS  Oximetry/IDS   Imported By: Sherian Rein 11/15/2009 09:55:23  _____________________________________________________________________  External Attachment:    Type:   Image     Comment:   External Document  Appended Document: Oximetry/IDS Dr Lynelle Doctor pt

## 2010-08-05 NOTE — Assessment & Plan Note (Signed)
Summary: osa per pew   Visit Type:  Initial Consult Copy to:  [name], MD Primary Provider/Referring Provider:  Juleen Starr.  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   PSg was performed due to excessive daytime fatigue, loud snoring, and  obesity .At the time of the study, she weighed 245 pounds with a height of 5 feet 9 inches,  BMI of 36, neck size of 16 inches. Epworth Sleepiness Score was 9. She reports 45 lb weight gain in the last 2 years ? due to steroids. She worked the night shift x 5 years. Bedtime medications included Xanax and morphine.  Her  other daytime medications also included Flexeril, bupropion, Cymbalta,  metoprolol, Dulera, Dexilant and Saphris. PSG >>The degree of sleep disordered breathing did not warrant     intervention with continuous positive airway pressure  or oxygen.   Few central apneas were noted, which may be related to narcotics, some degree of sleep-related hypoventilation with saturation in the low 90s was noted.She did not meet criteria for oxygen intervention.  Sleep latency was prolonged, sleep was fragmented with frequent  spontaneous arousals indicating insomnia, sleep onset, as well as sleep maintenance.  No evidence of limb movements, cardiac arrhythmias, or behavioral disturbance during sleep.  Pulmonary Sleep Consultation Name [name] DOB 23-Nov-1960 MRN [id number] Date 05/13/2009  History of Present Illness: Snoring, sometimes very sleepy  What time do you typically go to bed?(between what hours): 10p  How long does it take you to fall asleep? 2hr  How many times during the night do you wake up? x2  What time do you get out of bed to start your day? 8q  Do you drive or operate heavy machinery in your occupation? disable  How much has your weight changed (up or down)  over the past two years? (in pounds): 200-245  Have you ever had a sleep study before?  If yes,when and where: yes 2007 @ [facility]  Do you currently use CPAP ? If so , at what pressure? no  Do you wear oxygen at any time? If yes, how many liters per minute? 2 liters Current Medications (verified): 1)  Dulera 200-5 Mcg/act Aero (Mometasone Furo-Formoterol Fum) .... 2 Puffs Twice Per Day 2)  Neurontin 400 Mg Caps (Gabapentin) .Marland Kitchen.. 1 By Mouth Four Times Daily 3)  Dexilant 60 Mg Cpdr (Dexlansoprazole) .... One By Mouth Daily 4)  Ms Contin 60 Mg Xr12h-Tab (Morphine Sulfate) .... Take 1 Tablet By Mouth Three Times A Day 5)  Nabumetone 500 Mg Tabs (Nabumetone) .... Take 1 Tablet By Mouth Two Times A Day 6)  Flexeril 10 Mg Tabs (Cyclobenzaprine Hcl) .... As Needed By Mouth Muscle Spasms 7)  Epipen 0.3 Mg/0.19ml (1:1000) Devi (Epinephrine Hcl (Anaphylaxis)) .... For Severe Allergic Reaction 8)  Ventolin Hfa 108 (90 Base) Mcg/act Aers (Albuterol Sulfate) .... 2 Puffs Every 4 Hours As Needed 9)  Lamictal 200 Mg Tabs (Lamotrigine) .... Take 1 1/2 Tab By Mouth At Bedtime 10)  Ambien 10 Mg Tabs (Zolpidem Tartrate) .... 1/2 Tab By Mouth At Bedtime As Needed 11)  Saphria 5mg  .... At Bedtime 12)  Allergy Vaccine New Start [employer] 13)  Alprazolam 0.25 Mg Tabs (Alprazolam) .... Take 1 Tablet By Mouth Every 6 Hours As Needed 14)  Metoprolol Succinate 25 Mg Xr24h-Tab (Metoprolol Succinate) .... Take 1  Tablet By Mouth Once A Day  Allergies (verified): 1)  ! Pcn 2)  ! Stadol 3)  ! Lasix 4)  ! Iodine 5)  ! Elavil 6)  ! * Shellfish 7)  ! * Latex  Past History:  Past Medical History: Last updated: 04/15/2009  Status Ashtmaticus--admission 06/01/08 with VDRF Allergic rhinoconjunctivitis- ------ Skin Test Pos 10/10/08 ?MS dx in 2004 in [place].  HYPERGLYCEMIA (ICD-790.29) Headache ANXIETY (ICD-300.00) DEPRESSION (ICD-311) BACK PAIN, CHRONIC (ICD-724.5)--chronic DDD on disablility ARTHRITIS  (ICD-716.90) OBSTRUCTIVE SLEEP APNEA (ICD-327.23)- never CPAP G E R D fibromyalgia Allergy- shellfish Anemia  Pneumovax --April 15, 2009  flu shot 04/2009  Past Surgical History: Last updated: 09/20/2008 Appendectomy in 1987 C-section in 1988 Cholecystectomy in 1990 Foot surgery in 1995 Tubal ligation Hysterectomy in 2000 hernia repair  Family History: Last updated: 05/13/2009 Family History Ovarian Cancer--mother ( deceased at age28) Father deceased in his 69's of unknown causes. 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 2 children Patient never smoked.  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 2 children Patient never smoked.  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  The patient denies productive cough, non-productive cough, coughing up blood, acid heartburn, indigestion, headaches, nasal congestion/difficulty breathing through nose, sneezing, joint stiffness or pain, rash, change in color of mucus, and fever.    Vital Signs:  Patient profile:   50 year old female Height:      69 inches Weight:       241.50 pounds O2 Sat:      96 % on Room air Temp:     98.5 degrees F oral Pulse rate:   80 / minute BP sitting:   110 / 70  (left arm) Cuff size:   large  Vitals Entered By: Zackery Barefoot CMA (May 13, 2009 3:28 PM)  O2 Flow:  Room air CC: Pt here for sleep consult. Comments Medications reviewed with patient Zackery Barefoot CMA  May 13, 2009 3:40 PM    Physical Exam  Additional Exam:  Gen. Pleasant, well-nourished, in no distress, normal affect ENT - no lesions, no post nasal drip, class 2 airway Neck: No JVD, no thyromegaly, no carotid bruits Lungs: no use of accessory muscles, no dullness to percussion, clear without rales or rhonchi  Cardiovascular: Rhythm regular, heart sounds  normal, no murmurs or gallops, no peripheral edema Abdomen: soft and non-tender, no hepatosplenomegaly, BS normal. Musculoskeletal: No deformities, no cyanosis or clubbing Neuro:  alert, non focal     Impression & Recommendations:  Problem # 1:  OBSTRUCTIVE SLEEP APNEA (ICD-327.23)  Mild sleep disordered breathing which does not warrant CPAP at this time. Weight loss emphasized - likley related to intermittent steroids for asthma. If no change in  6 mnths, will re-assess  Orders: Est. Patient Level IV (36644)  Problem # 2:  Hypoxemia -related to sleep hypoventilation - combination of pulmonary disease & narcotics. Does not warrant O2 use at this time.  Patient Instructions: 1)  Please schedule a follow-up appointment in 6 months. 2)  Weight loss is  most important at this time rather than CPAP machine

## 2010-08-05 NOTE — Letter (Signed)
Summary: CMN for Oxygen/[facility]  CMN for Oxygen/[facility]   Imported By: Sherian Rein 11/15/2009 09:27:56  _____________________________________________________________________  External Attachment:    Type:   Image     Comment:   External Document

## 2010-08-05 NOTE — Procedures (Signed)
Summary: Order for overnight oximetry/IDS  Order for overnight oximetry/IDS   Imported By: Sherian Rein 10/21/2009 08:09:01  _____________________________________________________________________  External Attachment:    Type:   Image     Comment:   External Document

## 2010-08-05 NOTE — Assessment & Plan Note (Signed)
Summary: Pulmonary OV    Primary Provider/Referring Provider:  Rodell Perna  CC:  2 month asthma f/u.  Pt c/o increased sob and chest tightness and wheezing x2-3 days.Marland Kitchen  History of Present Illness: 50 year old female with history of Asthma,   June 14, 2008- Previous pt of Dr. Delford Field, last seen > 5years ago. -Presents for post hospital follow up. Admitted 11/27-12/1/09 for status asthmaticus with vent dependent resp. failure. Pt has a history of  asthma since childhood who presented with 5-6 hours of progressive  dyspnea and required intubation upon arrival to the emergency  department.  She was admitted with a diagnosis of status asthmaticus and  vent-dependent respiratory She was treated with systemic  steroids, nebulized bronchodilators, mechanical ventilatory support, DVT  prophylaxis, stress ulcer prophylaxis, and glycemic control.  She  responded well to these therapies.  She extubated herself in the early  morning hours of June 03, 2008, and had no problems maintaining her   own respirations.  She remained in the intensive care unit during that  day, and she was transferred out of the intensive care unit on June 04, 2008 to a regular floor.  At the time of discharge, she has markedly  improved with no wheezes heard.  Further discussion regarding her asthma management indicates that she is noncompliant with her controller  regimen, failing to see a need for chronic controller therapy prior to admission.   She was discharged on Advair 250/50 and prednisone taper.  Now finished steroids. Doing well, return to baseline-better. No use of rescue meds since discharge. Use Advair two times a day - no problems. Denies chest pain, dyspnea, orthopnea, hemoptysis, fever, n/v/d, edema.   September 12, 2008 10:36 AM at last ov we: 1)  Continue on Adviar 250/50 two times a day , rinse well after use.  2)  start omeprazole one daily 3)  follow reflux diet 4)  allergy evaluation with Dr Maple Hudson  Pt  notes more wheezing and dyspneic. Pt notes more rescue inhaler use this week No post nasal drip.  Notes some chest tightness and wheezing. No ED visits since last ov 12/09 Pt notes if swallows gets stuck in throat.  There is significant dysphagia. July 05, 2008 11:04 AM At last OV post hosp with NP hx as above.  Rx was : Continue on Adviar 250/50 two times a day , rinse well after use.  follow up 2-3 weeks Dr. Delford Field with PFTs Pt has done well.  No rescue inhaler use.  Lots of GERD.  when here in office 73yrs ago  GERD was a major issue for asthma trigger no allergy eval in years moved away and now back here for care. Hot and cold air and fumes/perfumes/smoke are triggers. has more than two nocturnal asthma flares was on vent and got off with last hospitalization  Current Medications (verified): 1)  Advair Diskus 250-50 Mcg/dose Misc (Fluticasone-Salmeterol) .Marland Kitchen.. 1 Puff Two Times A Day 2)  Ventolin Hfa 108 (90 Base) Mcg/act Aers (Albuterol Sulfate) .... 2 Puffs Every 4 Hours As Needed 3)  Neurontin 400 Mg Caps (Gabapentin) .Marland Kitchen.. 1 By Mouth Four Times Daily 4)  Cymbalta 30 Mg Cpep (Duloxetine Hcl) .Marland Kitchen.. 1 By Mouth Daily 5)  Ms Contin 30 Mg Xr12h-Tab (Morphine Sulfate) .Marland Kitchen.. 1 By Mouth Three Times A Day 6)  Relafen .... Take As Directed 7)  Flexeril 10 Mg Tabs (Cyclobenzaprine Hcl) .... As Needed By Mouth Muscle Spasms 8)  Combivent 103-18 Mcg/act Aero (Ipratropium-Albuterol) .Marland KitchenMarland KitchenMarland Kitchen  As Needed 9)  Omeprazole 20 Mg  Cpdr (Omeprazole) .... By Mouth Daily. Take One Half Hour Before Eating.  Allergies (verified): 1)  ! Pcn 2)  ! Stadol 3)  ! Lasix 4)  ! Iodine 5)  ! Elavil 6)  ! * Shellfish  Past History  Past Medical History:  Status Ashtmaticus--admission 06/01/08 with VDRF HYPERGLYCEMIA (ICD-790.29) ANXIETY (ICD-300.00) DEPRESSION (ICD-311) BACK PAIN, CHRONIC (ICD-724.5)--chronic DDD on disablility ARTHRITIS (ICD-716.90) OBSTRUCTIVE SLEEP APNEA (ICD-327.23) G E R D   (07/05/2008)  Review of Systems       The patient complains of shortness of breath with activity, shortness of breath at rest, productive cough, chest pain, difficulty swallowing, and sore throat.  The patient denies non-productive cough, coughing up blood, irregular heartbeats, acid heartburn, indigestion, loss of appetite, weight change, abdominal pain, tooth/dental problems, headaches, nasal congestion/difficulty breathing through nose, sneezing, itching, ear ache, anxiety, depression, hand/feet swelling, joint stiffness or pain, rash, change in color of mucus, and fever.    Vital Signs:  Patient profile:   50 year old female Height:      69 inches (175.26 cm) Weight:      229 pounds (104.09 kg) BMI:     33.94 O2 Sat:      96 % Temp:     98.2 degrees F (36.78 degrees C) oral Pulse rate:   115 / minute BP sitting:   130 / 84  (left arm) Cuff size:   regular  Vitals Entered By: Michel Bickers CMA (September 12, 2008 10:22 AM)  O2 Sat at Rest %:  96 O2 Flow:  room air  Physical Exam  Additional Exam:  Gen: Pleasant, well-nourished, in no distress , normal affect ENT: no lesions, no post nasal drip,  mild nasal mucosal erythema Neck: No JVD, no TMG, no carotid bruits Lungs: No use of accessory muscles, no dullness to percussion,  poor airflow, inspiratory and expiratory wheeze Cardiovascular: RRR, heart sounds normal, no murmurs or gallops, no peripheral edema Abdomen: soft and non-tender, no HSM, BS normal Musculoskeletal: No deformities, no cyanosis or clubbing Neuro: alert, non-focal     Pulmonary Function Test Height (in.): 69 Gender: Female  Impression & Recommendations:  Problem # 1:  ASTHMA (ICD-493.90) Assessment Deteriorated Moderate persistent asthma with flare plan: Add Qvar two puffs twice daily until sample gone Stay on advair  Prednisone 10mg  4 each am x3days, 3 x 3days, 2 x 3days, 1 x 3days then stop Return 6 weeks  Problem # 2:  DYSPHAGIA PHARYNGOESOPHAGEAL  PHASE (OZH-086.57) Assessment: Deteriorated Significant pharyngeal dysphagia ? reflux resulting in lower airway inflammation  plan: GI referral for eval of dysphagia  Medications Added to Medication List This Visit: 1)  Qvar 40 Mcg/act Aers (Beclomethasone dipropionate) .... Two puffs twice daily 2)  Prednisone 10 Mg Tabs (Prednisone) .... Take as directed 4 each am x3days, 3 x 3days, 2 x 3days, 1 x 3days then stop  Complete Medication List: 1)  Advair Diskus 250-50 Mcg/dose Misc (Fluticasone-salmeterol) .Marland Kitchen.. 1 puff two times a day 2)  Ventolin Hfa 108 (90 Base) Mcg/act Aers (Albuterol sulfate) .... 2 puffs every 4 hours as needed 3)  Neurontin 400 Mg Caps (Gabapentin) .Marland Kitchen.. 1 by mouth four times daily 4)  Cymbalta 30 Mg Cpep (Duloxetine hcl) .Marland Kitchen.. 1 by mouth daily 5)  Ms Contin 30 Mg Xr12h-tab (Morphine sulfate) .Marland Kitchen.. 1 by mouth three times a day 6)  Relafen  .... Take as directed 7)  Flexeril 10 Mg Tabs (Cyclobenzaprine hcl) .Marland KitchenMarland KitchenMarland Kitchen  As needed by mouth muscle spasms 8)  Omeprazole 20 Mg Cpdr (Omeprazole) .... By mouth daily. take one half hour before eating. 9)  Qvar 40 Mcg/act Aers (Beclomethasone dipropionate) .... Two puffs twice daily 10)  Prednisone 10 Mg Tabs (Prednisone) .... Take as directed 4 each am x3days, 3 x 3days, 2 x 3days, 1 x 3days then stop  Other Orders: Est. Patient Level V (91478) HFA Instruction [phone]) Gastroenterology Referral (GI)  Patient Instructions: 1)  A GI referral will be made for swallowing eval 2)  Add Qvar two puffs twice daily until sample gone 3)  Stay on advair  4)  Prednisone 10mg  4 each am x3days, 3 x 3days, 2 x 3days, 1 x 3days then stop 5)  Return 6 weeks 6)  The medication list was reviewed and reconciled.  All changed / newly prescribed medications were explained.  A complete medication list was provided to the patient / caregiver.  Prescriptions: PREDNISONE 10 MG  TABS (PREDNISONE) Take as directed 4 each am x3days, 3 x 3days, 2 x 3days, 1 x  3days then stop  #30 x 0   Entered and Authorized by:   Storm Frisk MD   Signed by:   Storm Frisk MD on 09/12/2008   Method used:   Electronically to        [facility]* (retail)       [address]       [place], [place]  [address]       Ph: [phone]       Fax: [phone]   RxID:   [id number]

## 2010-08-05 NOTE — Progress Notes (Signed)
Summary: ALLERGY  Phone Note Other Incoming   Caller: Letter Details for Reason: Sent letter no response. Summary of Call: Just wanted you to know we still haven't heard from Mrs.Trnka. So you can take allergy vaccine & xolair off her med list. Initial call taken by: Dimas Millin,  September 25, 2009 9:24 AM  Follow-up for Phone Call        Done Follow-up by: Waymon Budge MD,  September 25, 2009 1:29 PM

## 2010-08-05 NOTE — Miscellaneous (Signed)
Summary: Allergy Inject program for Xolair pt/[facility]  Allergy Inject program for Xolair pt/[name]   Imported By: Sherian Rein 04/16/2009 14:17:32  _____________________________________________________________________  External Attachment:    Type:   Image     Comment:   External Document

## 2010-08-05 NOTE — Progress Notes (Signed)
Summary: speech therapy/ xolair  Phone Note Call from Patient Call back at Home Phone [phone]   Caller: Patient Call For: [place] Summary of Call: pt needs speeh therapy set up. says she was not aware that she had an appt (that she missed). also wants to know if [facility] has her xolair set up yet.  Initial call taken by: Tivis Ringer,  May 09, 2009 3:13 PM  Follow-up for Phone Call        Spoke with pt.  She states that she has yet to be contacted yet about speech therapy appt and also xolair.  Looks like orders for speech therapy were faxed over on 04/02/09 to [facility].  Almyra Free, can you please advise the status of xolair and speech therapy? Thanks for your help! Follow-up by: Vernie Murders,  May 09, 2009 3:46 PM  Additional Follow-up for Phone Call Additional follow up Details #1::        spoke to pt refaxed speech theropy and will check on the xolair  Additional Follow-up by: Oneita Jolly,  May 09, 2009 4:35 PM

## 2010-08-05 NOTE — Progress Notes (Signed)
Summary: refill epi pen/xolair & allergy injections  Phone Note Call from Patient   Caller: Patient Call For: Dr. Delford Field Summary of Call: [phone] Pt called and stated that her epi-pen has expired. Pt needs a new rx for an epi pen [facility] [address] Alfonso Ramus  October 31, 2009 4:25 PM   Follow-up for Phone Call        Rx for epipen refilled.  Pt aware. Follow-up by: Vernie Murders,  October 31, 2009 4:30 PM    Prescriptions: EPIPEN 0.3 MG/0.3ML (1:1000) DEVI (EPINEPHRINE HCL (ANAPHYLAXIS)) For severe allergic reaction  #1 x 11   Entered by:   Vernie Murders   Authorized by:   Storm Frisk MD   Signed by:   Vernie Murders on 10/31/2009   Method used:   [facility]  [address] [phone][facility] (retail)       [address]       [place], [place]  [id number]       Ph: [phone] or [phone]       Fax: [phone]   RxID:   [id number]

## 2010-08-05 NOTE — Letter (Signed)
Summary: CMN for Oxygen/[facility]  CMN for Oxygen/[facility]   Imported By: Sherian Rein 11/15/2009 09:52:50  _____________________________________________________________________  External Attachment:    Type:   Image     Comment:   External Document

## 2010-08-05 NOTE — Miscellaneous (Signed)
Summary: Alprazolam Rx  Clinical Lists Changes  Medications: Rx of ALPRAZOLAM 0.25 MG TABS (ALPRAZOLAM) Take 1 tablet by mouth every 6 hours as needed;  #90 x 6;  Signed;  Entered by: Gweneth Dimitri RN;  Authorized by: Storm Frisk MD;  Method used: Historical    Prescriptions: ALPRAZOLAM 0.25 MG TABS (ALPRAZOLAM) Take 1 tablet by mouth every 6 hours as needed  #90 x 6   Entered by:   Gweneth Dimitri RN   Authorized by:   Storm Frisk MD   Signed by:   Gweneth Dimitri RN on 01/21/2010   Method used:   Historical   RxID:   [id number]  [facility]  Request signed by PW and [facility].  Gweneth Dimitri RN  January 21, 2010 5:27 PM

## 2010-08-05 NOTE — Miscellaneous (Signed)
Summary: Waiver of Liability/[employer] of Liability/[place] [facility]   Imported By: Sherian Rein 11/16/2008 14:34:49  _____________________________________________________________________  External Attachment:    Type:   Image     Comment:   External Document

## 2010-08-05 NOTE — Progress Notes (Signed)
Summary: appts/ xoalir  Phone Note Call from Patient Call back at [facility] Phone [phone]   Caller: Patient Call For: tammy parrett Summary of Call: pt still on prednisone. wants to know which appt w/ which dr she needs to keep. she has an appt w/ cy for 5/26 and pw on 5/31. pt recently saw tp. PT ALSO wants to speak to [place] re: xoalir Initial call taken by: Tivis Ringer, CNA,  Nov 11, 2009 11:47 AM  Follow-up for Phone Call        Spoke with pt,  She had appt with PW for today at 4:15.  I advised that she cancel this one and keep ov for the 31st since this was TP's recs from ov with her on 11/05/09.  She states that she will also keep appt with Dr Maple Hudson to discuss xolair. Follow-up by: Vernie Murders,  Nov 11, 2009 11:59 AM

## 2010-08-05 NOTE — Assessment & Plan Note (Signed)
Summary: SEVERE DYSPHAGIA/FH   History of Present Illness Visit Type: consult Primary GI MD: Yancey Flemings MD Primary Provider: Juleen Starr. Requesting Provider: [name], MD Chief Complaint: trouble swallowing, solids and pills History of Present Illness:   51 year old African American female with a history of severe asthma, status post status asthmaticus with vent-dependent respiratory failure, hyperlipidemia, obesity, sleep apnea, fibromyalgia, GERD status post fundoplication, prior cholecystectomy, prior appendectomy, and hysterectomy. She sent today regarding dysphagia. Patient reports a prior history of upper endoscopy with esophageal dilation for peptic stricture while living in [place]. She underwent fundoplication for reflux disease in 2004 (also in [place]). Today she reports a one-year history of intermittent solid food dysphagia. This has worsened with time. This occurs with most meals. Liquids are not a problem. She does have occasional heartburn and indigestion which is nicely managed with omeprazole. She's had some mild weight loss. Her GI review of systems is otherwise entirely negative. She is accompanied today by her boyfriend.   GI Review of Systems    Reports acid reflux, dysphagia with solids, heartburn, and  weight loss.      Denies abdominal pain, belching, bloating, chest pain, dysphagia with liquids, loss of appetite, nausea, vomiting, vomiting blood, and  weight gain.        Denies anal fissure, black tarry stools, change in bowel habit, constipation, diarrhea, diverticulosis, fecal incontinence, heme positive stool, hemorrhoids, irritable bowel syndrome, jaundice, light color stool, liver problems, rectal bleeding, and  rectal pain.    Current Medications (verified): 1)  Advair Diskus 250-50 Mcg/dose Misc (Fluticasone-Salmeterol) .Marland Kitchen.. 1 Puff Two Times A Day 2)  Ventolin Hfa 108 (90 Base) Mcg/act Aers (Albuterol Sulfate) .... 2 Puffs Every 4 Hours As  Needed 3)  Neurontin 400 Mg Caps (Gabapentin) .Marland Kitchen.. 1 By Mouth Four Times Daily 4)  Cymbalta 30 Mg Cpep (Duloxetine Hcl) .Marland Kitchen.. 1 By Mouth Daily 5)  Ms Contin 60 Mg Xr12h-Tab (Morphine Sulfate) .... Take 1 Tablet By Mouth Three Times A Day 6)  Nabumetone 500 Mg Tabs (Nabumetone) .... Take 1 Tablet By Mouth Two Times A Day 7)  Flexeril 10 Mg Tabs (Cyclobenzaprine Hcl) .... As Needed By Mouth Muscle Spasms 8)  Omeprazole 20 Mg  Cpdr (Omeprazole) .... By Mouth Daily. Take One Half Hour Before Eating. 9)  Qvar 40 Mcg/act  Aers (Beclomethasone Dipropionate) .... Two Puffs Twice Daily 10)  Epipen 0.3 Mg/0.63ml (1:1000) Devi (Epinephrine Hcl (Anaphylaxis)) .... For Severe Allergic Reaction  Allergies (verified): 1)  ! Pcn 2)  ! Stadol 3)  ! Lasix 4)  ! Iodine 5)  ! Elavil 6)  ! * Shellfish  Past History:  Past Medical History:     Status Ashtmaticus--admission 06/01/08 with VDRF    Allergic rhinoconjunctivitis- ------ Skin Test Pos 10/10/08    HYPERGLYCEMIA (ICD-790.29)    Headache    ANXIETY (ICD-300.00)    DEPRESSION (ICD-311)    BACK PAIN, CHRONIC (ICD-724.5)--chronic DDD on disablility    ARTHRITIS (ICD-716.90)    OBSTRUCTIVE SLEEP APNEA (ICD-327.23)- never CPAP    G E R D    fibromyalgia    Allergy- shellfish    Anemia  Past Surgical History:    Reviewed history from 09/20/2008 and no changes required:    Appendectomy in 1987    C-section in 1988    Cholecystectomy in 1990    Foot surgery in 1995    Tubal ligation    Hysterectomy in 2000    hernia repair  Family History:  Family History Ovarian Cancer--mother ( deceased at age28)    Father deceased in his 32's of unknown causes.    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    2 children    Patient never smoked.   Review of Systems        sinus an allergy trouble, anxiety, arthritis, back pain, depression, fatigue, headaches, heart murmur, muscle cramps, night sweats, occasional shortness of breath, sleeping problems, sore throat, feet swelling, increased thirst, voice change,. All other systems reported as negative  Vital Signs:  Patient profile:   50 year old female Height:      69 inches Weight:      231 pounds BMI:     34.24 Pulse rate:   68 / minute Pulse rhythm:   regular BP sitting:   126 / 84  (left arm) Cuff size:   large  Vitals Entered By: Francee Piccolo CMA (October 11, 2008 10:27 AM)  Physical Exam  General:  Obese but otherwise Well developed, well nourished, no acute distress. Head:  Normocephalic and atraumatic. Eyes:  PERRLA, no icterus. Nose:  No deformity, discharge,  or lesions. Mouth:  No deformity or lesions, dentition normal. Neck:  Supple; no masses or thyromegaly.short and thick Lungs:  Clear throughout to auscultation. Heart:  Regular rate and rhythm; no murmurs, rubs,  or bruits. Abdomen:  Obese,Soft, nontender and nondistended. No masses, hepatosplenomegaly or hernias noted. Normal bowel sounds.Prior surgical incisions well-healed Msk:  Symmetrical with no gross deformities. Normal posture. Pulses:  Normal pulses noted. Extremities:  No clubbing, cyanosis, edema or deformities noted. Neurologic:  Alert and  oriented x4;  grossly normal neurologically. Skin:  Intact without significant lesions or rashes. Cervical Nodes:  No significant cervical or supraclavicular adenopathy. Psych:  Alert and cooperative. Normal mood and affect.   Impression & Recommendations:  Problem # 1:  DYSPHAGIA (OZH-086.57) Intermittent solid food dysphagia. Progressive with time. Suspect peptic stricture. She has had prior fundoplication.  Plan: #1. Upper endoscopy with esophageal dilation. The nature of the procedure as well as the risks, benefits, and alternatives were reviewed in detail. She understood  and agreed to proceed. The patient is HIGH RISK due to her pulmonary disease. As such, examination will be performed at the hospital. #2. Continue proton pump inhibitor therapy on a daily basis.  Problem # 2:  G E R D (ICD-530.81) long history of GERD. Status post fundoplication 2004. Now with recurrent GERD symptoms and dysphagia. Suspect slipped wrap and stricture. Continue proton pump inhibitor therapy, reflux precautions with attention to weight loss, and plans for endoscopy as discussed above  Other Orders: ZENDO with Sav. Dil (ZENDO/Sav Dil.)  Patient Instructions: 1)  EGD with Sav./C-Arm  11/13/08 8:30 am 2)  Upper Endoscopy brochure given.  3)  The medication list was reviewed and reconciled.  All changed / newly prescribed medications were explained.  A complete medication list was provided to the patient / caregiver 4)  Copy to: Dr. [name], Dr. Billee Cashing

## 2010-08-05 NOTE — Progress Notes (Signed)
Summary: cheaper medication  Phone Note Call from Patient   Caller: Patient Call For: Else Summary of Call: can't afford delera and ventolin would like to no if she can have something cheaper. Initial call taken by: Rickard Patience,  Nov 22, 2009 9:40 AM  Follow-up for Phone Call        pt has ov with pw on monday and she will print her ins. formulary out and bring with her to appt and discuss change of meds at that time, she currently has samples to take until ov Follow-up by: Philipp Deputy CMA,  Nov 22, 2009 10:02 AM

## 2010-08-05 NOTE — Assessment & Plan Note (Signed)
Summary: Terrilyn Saver  Nurse Visit   Allergies: 1)  ! Pcn 2)  ! Stadol 3)  ! Lasix 4)  ! Iodine 5)  ! Elavil 6)  ! * Shellfish 7)  ! * Latex  Medication Administration  Injection # 1:    Medication: Xolair (omalizumab) 150mg     Diagnosis: EXTRINSIC ASTHMA, UNSPECIFIED (ICD-493.00)    Route: SQ    Site: R deltoid    Exp Date: 04/2012    Lot #: [id number]    Mfr: Mendel Ryder    Comments: Injection given by Drucie Opitz, CMA in allergy lab. Xoalir 375mg . 1.6ml x 2 in Right and 1.49ml x 1 in Left Deltoid. Pt waited 2 hours(1st injection for pt).    Patient tolerated injection without complications  Orders Added: 1)  Admin of patients own med IM/SQ [[id number]]

## 2010-08-05 NOTE — Progress Notes (Signed)
Summary: nos appt  Phone Note Call from Patient   Caller: [email or web address]  Call For: [place] Summary of Call: Rsc nos from 5/23 to 7/11 @ 9:30a. Initial call taken by: Darletta Moll,  Nov 26, 2009 10:38 AM

## 2010-08-05 NOTE — Progress Notes (Signed)
Summary: alprazolam refill  Phone Note From Pharmacy   [facility]  [address] [phone][facility] Call For: PW  Summary of Call: refill request for pt alprazolam 0.25mg . please advise if ok to refill. Carron Curie CMA  Nov 21, 2009 5:04 PM   Follow-up for Phone Call        ok to refill Follow-up by: Storm Frisk MD,  Nov 22, 2009 9:13 AM    Prescriptions: ALPRAZOLAM 0.25 MG TABS (ALPRAZOLAM) Take 1 tablet by mouth every 6 hours as needed  #90 x 1   Entered by:   Carron Curie CMA   Authorized by:   Storm Frisk MD   Signed by:   Carron Curie CMA on 11/22/2009   Method used:   [facility]phoned to ...       [facility]  [address] [phone][facility] (retail)       [address]       [place], [place]  [id number]       Ph: [phone] or [phone]       Fax: [phone]   RxID:   [id number]

## 2010-08-05 NOTE — Assessment & Plan Note (Signed)
Summary: RE-START ALLERGY INJECTIONS///KP   Copy to:  Chelsea Levans, MD Primary Provider/Referring Provider:  Juleen Dunn.  CC:  Pt would like to restart allergy vaccine.Marland Kitchen  History of Present Illness:  318/10- 50yoF seen in allergy consult at kind request of Dr [name], asking evaluation to help management of asthma.  Required intubation 11/09. gives hx of seasonal allergic rhinits and was skin test positive in 1996 to grass and dust, but never on allergy v accine. States she has had "allergy and asthma since birth". Current difficult interval began 5 months ago. Triggers include cold and hot weather, strong odors and cheimical smells. She also avoids shell fish which may cause wheeze. Latex associated with rash and chest tightness.  Notes much nose and sinus area pressure discomfort, ears hurt, post nasal drip. denies fever or purulent discharge, adenopathy, nausea or vomiting/ Environment- Older house, basement, window AC, gas heat, her bedroom floor- hardwood, no indoor pets. Brother lives there and smokes outside. Denies mold/ mildew.  10/10/08- Allergy evaluation- asthma Tree pollen heavy now and distinctly bothering eyes and nose itch and drain. Feels wheezier. Here for allergy testing- msses antihistamines.  12/03/09 nAllergic asthma Skin tests- Strongly POS grass, weed, tree, dust last year. Asks about restarting allergy shots. She had barely started build-up here last year, but stopped because she thought they might be causing rash on wrist and ankles. Also thought she got tight in chest, like her asthma, 2 days after  allergy shots. Xolair given once- too expensive. Other treatments aren't working well enough so she wants another look at allergy vaccine. Feels well today Denies much nasal congestion but did have sneeze and itch this Spring. Started home O2 for sleep at 2 l/M and says this helps her sleep.   Asthma History    Asthma Control Assessment:    Age range: 12+  years    Symptoms: throughout the day    Nighttime Awakenings: 0-2/month    Interferes w/ normal activity: no limitations    SABA use (not for EIB): several times per day    FEV1: 2.23 liters (today)    FEV1 Pred: 3.06 liters (today)    Asthma Control Assessment: Very Poorly Controlled   Preventive Screening-Counseling & Management  Alcohol-Tobacco     Smoking Status: never  Current Medications (verified): 1)  Neurontin 600 Mg Tabs (Gabapentin) .... Take 1 Tablet By Mouth Three Times A Day 2)  Dexilant 60 Mg Cpdr (Dexlansoprazole) .... One By Mouth Daily 3)  Ms Contin 30 Mg Xr12h-Tab (Morphine Sulfate) .... Every 8 Hours 4)  Nabumetone 500 Mg Tabs (Nabumetone) .... Take 1 Tablet By Mouth Two Times A Day 5)  Flexeril 10 Mg Tabs (Cyclobenzaprine Hcl) .... Three Times A Day 6)  Epipen 0.3 Mg/0.22ml (1:1000) Devi (Epinephrine Hcl (Anaphylaxis)) .... For Severe Allergic Reaction 7)  Ventolin Hfa 108 (90 Base) Mcg/act Aers (Albuterol Sulfate) .... 2 Puffs Every 4 Hours As Needed 8)  Lamictal 100 Mg Tabs (Lamotrigine) .... 3 At Bedtime 9)  Ambien 10 Mg Tabs (Zolpidem Tartrate) .Marland Kitchen.. 1 Tab By Mouth At Bedtime As Needed 10)  Saphris 5 Mg Subl (Asenapine Maleate) .Marland Kitchen.. 1 Under Tongue At Bedtime 11)  Alprazolam 0.25 Mg Tabs (Alprazolam) .... Take 1 Tablet By Mouth Every 6 Hours As Needed 12)  Metoprolol Succinate 25 Mg Xr24h-Tab (Metoprolol Succinate) .... Take 1 Tablet By Mouth Once A Day 13)  Pepcid 20 Mg Tabs (Famotidine) .Marland Kitchen.. 1 By Mouth At Bedtime 14)  Citrucel  Powd (Methylcellulose (Laxative)) .Marland KitchenMarland KitchenMarland Kitchen  Two Times A Day 15)  Bupropion Hcl 300 Mg Xr24h-Tab (Bupropion Hcl) .... Once Daily 16)  Zaleplon 10 Mg Caps (Zaleplon) .... At Bedtime As Needed 17)  Cymbalta 60 Mg Cpep (Duloxetine Hcl) .... Once Daily 18)  Spiriva Handihaler 18 Mcg Caps (Tiotropium Bromide Monohydrate) .Marland Kitchen.. 1 Puff Once Daily  Allergies (verified): 1)  ! Pcn 2)  ! Stadol 3)  ! Lasix 4)  ! Iodine 5)  ! Elavil 6)  ! *  Shellfish 7)  ! * Latex  Past History:  Past Medical History: Last updated: 10/29/2009  Status Ashtmaticus--admission 06/01/08 with VDRF Allergic rhinoconjunctivitis- ------ Skin Test Pos 10/10/08 : Strongly POS grass, weed, tree, dust ?MS dx in 2004 in [place].  HYPERGLYCEMIA (ICD-790.29) Headache ANXIETY (ICD-300.00) DEPRESSION (ICD-311) BACK PAIN, CHRONIC (ICD-724.5)--chronic DDD on disablility ARTHRITIS (ICD-716.90) OBSTRUCTIVE SLEEP APNEA (ICD-327.23)- never CPAP  not indicated on Sleep study.............Marland KitchenMarland KitchenDyke Brackett R D fibromyalgia Allergy- shellfish Anemia  Pneumovax --April 15, 2009  flu shot 04/2009  Past Surgical History: Last updated: 09/20/2008 Appendectomy in 1987 C-section in 1988 Cholecystectomy in 1990 Foot surgery in 1995 Tubal ligation Hysterectomy in 2000 hernia repair  Family History: Last updated: 05/13/2009 Family History Ovarian Cancer--mother ( deceased at age28) Father deceased in his 90's of unknown causes. 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 2 children Patient never smoked.  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    Vital Signs:  Patient profile:   50 year old female Height:      69 inches Weight:      225 pounds BMI:     33.35 O2 Sat:      92 % on Room air Pulse  rate:   105 / minute BP sitting:   110 / 70  (left arm) Cuff size:   large  Vitals Entered By: Reynaldo Minium CMA (Dec 03, 2009 11:14 AM)  O2 Flow:  Room air  Physical Exam  Additional Exam:  Gen. Pleasant, well-nourished, in no distress ENT - no lesions, no post nasal drip, class 2 airway Neck: No JVD, no thyromegaly, no carotid bruits Lungs: coarse BS , faint wheeze Cardiovascular: Rhythm regular, heart sounds  normal, no murmurs or gallops, no peripheral edema Abdomen: soft and non-tender, no hepatosplenomegaly, BS normal. Musculoskeletal: No deformities, no cyanosis or clubbing Neuro:  alert, non focal     Pre-Spirometry FEV1    Value: 2.23 L     Pred: 3.06 L     Impression & Recommendations:  Problem # 1:  ASTHMA (ICD-493.90) Difficult to control moderate persistent asthma, worse through Spring pollen season. She wants to retry allergy vaccine which she dropped before for questionable side effects. chest tightness 2 days after the shots would be long interval for an IgE mediated reaction and she may not be correct about the association.. She does have an Epipen.  Problem # 2:  RHINOCONJUNCTIVITIS, ALLERGIC (ICD-477.9)  Low grade persistent, on no medication. Can  try loratadine.  Medications Added to Medication List This Visit: 1)  Allergy Vaccine New Start   Other Orders: Est. Patient Level IV (16109)  Patient Instructions: 1)  Please schedule a follow-up appointment in 2 months. 2)  I will have the allergy lab contact you about restarting allergy vaccine.  Carry your epipen with you. 3)  You can try otc antihistamine such as loratadine/ claritin or Allegra as needed for allergy.     Appended Document: RE-START ALLERGY INJECTIONS///KP Mrs.Hackbart came in 12-13-09 and got her first shot of 1:50000.Hopefully she'll come this week. If not we will give her a call.

## 2010-08-05 NOTE — Assessment & Plan Note (Signed)
Summary: 1 wk phosp per DS//td   PCP:  [facility]  Chief Complaint:  1 wk. post-hosp. Marland Kitchen  History of Present Illness: 50 year old female with history of Asthma,   June 14, 2008- Previous pt of Dr. Delford Field, last seen > 5years ago. -Presents for post hospital follow up. Admitted 11/27-12/1/09 for status asthmaticus with vent dependent resp. failure. Pt has a history of  asthma since childhood who presented with 5-6 hours of progressive  dyspnea and required intubation upon arrival to the emergency  department.  She was admitted with a diagnosis of status asthmaticus and  vent-dependent respiratory She was treated with systemic  steroids, nebulized bronchodilators, mechanical ventilatory support, DVT  prophylaxis, stress ulcer prophylaxis, and glycemic control.  She  responded well to these therapies.  She extubated herself in the early  morning hours of June 03, 2008, and had no problems maintaining her   own respirations.  She remained in the intensive care unit during that  day, and she was transferred out of the intensive care unit on June 04, 2008 to a regular floor.  At the time of discharge, she has markedly  improved with no wheezes heard.  Further discussion regarding her asthma management indicates that she is noncompliant with her controller  regimen, failing to see a need for chronic controller therapy prior to admission.   She was discharged on Advair 250/50 and prednisone taper.  Now finished steroids. Doing well, return to baseline-better. No use of rescue meds since discharge. Use Advair two times a day - no problems. Denies chest pain, dyspnea, orthopnea, hemoptysis, fever, n/v/d, edema.     Prior Medications Reviewed Using: Patient Recall  Updated Prior Medication List: ADVAIR DISKUS 250-50 MCG/DOSE MISC (FLUTICASONE-SALMETEROL) 1 puff two times a day VENTOLIN HFA 108 (90 BASE) MCG/ACT AERS (ALBUTEROL SULFATE) 2 puffs every 4 hours as needed NEURONTIN 400 MG CAPS  (GABAPENTIN) 1 by mouth four times daily CYMBALTA 30 MG CPEP (DULOXETINE HCL) 1 by mouth daily MS CONTIN 30 MG XR12H-TAB (MORPHINE SULFATE) 1 by mouth three times a day * RELAFEN Take as directed FLEXERIL 10 MG TABS (CYCLOBENZAPRINE HCL) as needed by mouth muscle spasms  Current Allergies (reviewed today): ! PCN ! STADOL ! LASIX ! IODINE ! ELAVIL ! * SHELLFISH  Past Medical History:     Status Ashtmaticus--admission 06/01/08 with VDRF. :     METABOLIC ACIDOSIS (ICD-276.2)    SINUS TACHYCARDIA (ICD-427.89)    RESPIRATORY FAILURE (ICD-518.81)    HYPERGLYCEMIA (ICD-790.29)    ANXIETY (ICD-300.00)    DEPRESSION (ICD-311)    ASTHMA (ICD-493.90)    BACK PAIN, CHRONIC (ICD-724.5)--chronic DDD on disablility    ARTHRITIS (ICD-716.90)    OBSTRUCTIVE SLEEP APNEA (ICD-327.23)   Family History:    Reviewed history from 06/13/2008 and no changes required:       Family History Ovarian Cancer--mother ( deceased at age55)       Father deceased in his 34's of unknown causes.  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       2 children   Risk Factors:  Tobacco use:  never   Review of Systems      See HPI   Vital Signs:  Patient Profile:   50 Years Old Female Weight:      207  pounds O2 Sat:      98 % O2 treatment:    Room Air Temp:     97.9 degrees F oral Pulse rate:   92 / minute BP sitting:   130 / 86  (right arm) Cuff size:   regular  Vitals Entered By: Elray Buba RN (June 14, 2008 9:19 AM)             Is Patient Diabetic? No Comments Medications reviewed with patient Elray Buba RN  June 14, 2008 9:19 AM      Physical Exam  GEN: A/Ox3; pleasant , NAD HEENT:  [place]/AT, , EACs-clear, TMs-wnl, NOSE-clear, THROAT-clear NECK:  Supple w/ fair ROM; no JVD; normal carotid impulses w/o bruits; no thyromegaly or nodules palpated; no lymphadenopathy. CHEST:   Clear to P & A; w/o, wheezes/ rales/ or rhonchi. HEART:  RRR, no m/r/g   ABDOMEN:  Soft & nt; nml bowel sounds; no organomegaly or masses detected. EXT: Warm bil,  no calf pain, edema, clubbing, pulses intact        Impression & Recommendations:  Problem # 1:  ASTHMA (ICD-493.90) s/p status asthmaticus with VDRF  Now resolved. with improved control on Advair.  Asthma education given.  follow up 2 weeks. Dr. Delford Field with PFTs Please contact office for sooner follow up if symptoms do not improve or worsen  The following medications were removed from the medication list:    Prednisone 20 Mg Tabs (Prednisone) .Marland Kitchen... Tapering dose  Her updated medication list for this problem includes:    Advair Diskus 250-50 Mcg/dose Misc (Fluticasone-salmeterol) .Marland Kitchen... 1 puff two times a day    Ventolin Hfa 108 (90 Base) Mcg/act Aers (Albuterol sulfate) .Marland Kitchen... 2 puffs every 4 hours as needed  Orders: Est. Patient Level II (16109)   Medications Added to Medication List This Visit: 1)  Ms Contin 30 Mg Xr12h-tab (Morphine sulfate) .Marland Kitchen.. 1 by mouth three times a day 2)  Flexeril 10 Mg Tabs (Cyclobenzaprine hcl) .... As needed by mouth muscle spasms  Complete Medication List: 1)  Advair Diskus 250-50 Mcg/dose Misc (Fluticasone-salmeterol) .Marland Kitchen.. 1 puff two times a day 2)  Ventolin Hfa 108 (90 Base) Mcg/act Aers (Albuterol sulfate) .... 2 puffs every 4 hours as needed 3)  Neurontin 400 Mg Caps (Gabapentin) .Marland Kitchen.. 1 by mouth four times daily 4)  Cymbalta 30 Mg Cpep (Duloxetine hcl) .Marland Kitchen.. 1 by mouth daily 5)  Ms Contin 30 Mg Xr12h-tab (Morphine sulfate) .Marland Kitchen.. 1 by mouth three times a day 6)  Relafen  .... Take as directed 7)  Flexeril 10 Mg Tabs (Cyclobenzaprine hcl) .... As needed by mouth muscle spasms   Patient Instructions: 1)  Continue on Adviar 250/50 two times a day , rinse well after use.  2)  follow up 2-3 weeks Dr. Delford Field with PFTs 3)  Please contact office for sooner follow up if symptoms do not  improve or worsen    ]

## 2010-08-05 NOTE — Miscellaneous (Signed)
Summary: PFT CHARGES  Clinical Lists Changes  Orders: Added new Service order of Carbon Monoxide diffusing w/capacity (94720) - Signed Added new Service order of Lung Volumes (94240) - Signed Added new Service order of Spirometry (Pre & Post) (94060) - Signed 

## 2010-08-05 NOTE — Miscellaneous (Signed)
Summary: Order/[facility]  Order/[facility]   Imported By: Sherian Rein 12/26/2009 08:12:14  _____________________________________________________________________  External Attachment:    Type:   Image     Comment:   External Document

## 2010-08-05 NOTE — Miscellaneous (Signed)
Summary: Injection Record / [facility]    Injection Record /  Allergy    Imported By: Lennie Odor 03/07/2010 12:15:55  _____________________________________________________________________  External Attachment:    Type:   Image     Comment:   External Document

## 2010-08-05 NOTE — Progress Notes (Signed)
Summary: blood clot  Phone Note Call from Patient Call back at Home Phone [phone]   Caller: Patient Call For: [place] Summary of Call: pt states she was "just released" from [facility]. [place]. was found to have a blood clot in her stomach. told to call dr Delford Field. pt at home now. Initial call taken by: Tivis Ringer, CNA,  September 27, 2009 11:46 AM  Follow-up for Phone Call        Patient says she was told she may have poss blood clot. D-dimer elevated and pt c/o increased sob. Told she may need other testing and to call for appt today with Dr. Delford Field. Pt aware Dr. Delford Field is not in the office and agreed to see Dr. Sherene Sires today at 4 pm so she can be evaluated. Hospital records will be printed and given to [facility] for appt. Follow-up by: Michel Bickers CMA,  September 27, 2009 12:04 PM

## 2010-08-05 NOTE — Progress Notes (Signed)
Summary: nos appt  Phone Note Call from Patient   Caller: [email or web address]  Call For: [email or web address] Summary of Call: [facility]sc nos from 8/1 to 8/12 @ 10:30a. Initial call taken by: Darletta Moll,  February 04, 2010 10:19 AM

## 2010-08-05 NOTE — Progress Notes (Signed)
Summary: [facility]  [facility]   Imported By: Lanelle Bal 07/05/2008 15:37:45  _____________________________________________________________________  External Attachment:    Type:   Image     Comment:   External Document

## 2010-08-05 NOTE — Assessment & Plan Note (Signed)
Summary: Pulmonary OV   Copy to:  [name], MD Primary Provider/Referring Provider:  Juleen Starr.  CC:  Pt here for follow up. Pt states has been to ER due to chest pains, tachycardia, and and S.O.B.  History of Present Illness: 50yoF with known history of severe asthma w/ previous status asthmaticus requiring vent support in 11/09.    June 14, 2008- Previous pt of Dr. Delford Field, last seen > 5years ago. -Presents for post hospital follow up. Admitted 11/27-12/1/09 for status asthmaticus with vent dependent resp. failure. Pt has a history of  asthma since childhood who presented with 5-6 hours of progressive  dyspnea and required intubation upon arrival to the emergency  department.  She was admitted with a diagnosis of status asthmaticus and  vent-dependent respiratory She was treated with systemic  steroids, nebulized bronchodilators, mechanical ventilatory support, DVT  prophylaxis, stress ulcer prophylaxis, and glycemic control.  She  responded well to these therapies.  She extubated herself in the early  morning hours of June 03, 2008, and had no problems maintaining her  own respirations.    09/12/08- asthma flare- tx w/ steroids. referral to allergy and GI   10/10/08- Allergy evaluation- asthma- Tree pollen heavy now and distinctly bothering eyes and nose itch and drain. Feels wheezier. Here for allergy testing- msses antihistamines. Skin tests- Strongly POS grass, weed, tree, dust   June 14, 2008- Previous pt of Dr. Delford Field, last seen > 5years ago. -Presents for post hospital follow up. Admitted 11/27-12/1/09 for status asthmaticus with vent dependent resp. failure. Pt has a history of  asthma since childhood who presented with 5-6 hours of progressive  dyspnea and required intubation upon arrival to the emergency  department.  She was admitted with a diagnosis of status asthmaticus and  vent-Nov 09, 2008--Presents for an acute office viist. Complains of increased SOB,  wheezing, prod cough with brownish-colored mucus onset last night - denies f/c/s. Assures me she is taking all her meds w/o missed doses. Has seen some blood streaked mucous intermittent. Lots on nasal congestion, drainage.  --Rx Avelox  January 16, 2009 --Presents for an acute office visit.  Complains of increased DOE, wheezing, minimal cough-dry. Breathing has worsened w/ increased temps outside. Has had to use rescue meds more often. NO purulent sputum, chest pain,  orthopnea, hemoptysis, fever, n/v/d, edema. Wheezing is getting worse last 2 days. ventolin helps.   February 18, 2009 1:41 PM at last ov seen by NP and rx was : Prednisone taper over next week.  Increase Advair 500/50 two times a day (use sample), then go back to 250/50 two times a day  Still having to use rescue inhaler daily.  Pt improved after 7/14 ov then worsened.  Notes no pn drip.  Has slight heartburn.  Notes sl amount of chest pain.  No bedtime dyspnea.  No mucous production.  Had allergy eval in past with multiple positive allergies.  IgE levels >500. Was to be considered for immunotherapy but not yet proceeded.   April 02, 2009 2:42 PM Pt was looking into [place].  The  pt is nervous about starting xolair The pt notes since august in and out of hospital with dyspnea and chest pain.  Dx of vocal cord dysfunction was rendered.  No f/u with speech path yet made. Pt notes spells of throat closure.  Cough persists with dyspnea.  No wheeze.  No excess mucous production noted. There are not any alleviating or precipitating factors noted.  The symptoms  do not generally fluctuate. Pt denies any significant sore throat, nasal congestion or excess secretions, fever, chills, sweats, unintended weight loss, pleurtic or exertional chest pain, orthopnea PND, or leg swelling The pt has started allergy immunotherapy      Asthma History    Asthma Control Assessment:    Age range: 12+ years    Symptoms: >2 days/week    Nighttime  Awakenings: 0-2/month    Interferes w/ normal activity: some limitations    SABA use (not for EIB): >2 days/week    ATAQ questionnaire: 3-4    FEV1: 2.23 liters (today)    FEV1 Pred: 3.06 liters (today)    Asthma Control Assessment: Very Poorly Controlled  Preventive Screening-Counseling & Management  Alcohol-Tobacco     Smoking Status: never  Current Medications (verified): 1)  Advair Diskus 500-50 Mcg/dose  Misc (Fluticasone-Salmeterol) .... One Puff Twice Daily 2)  Neurontin 400 Mg Caps (Gabapentin) .Marland Kitchen.. 1 By Mouth Four Times Daily 3)  Omeprazole 20 Mg  Cpdr (Omeprazole) .... By Mouth Daily. Take One Half Hour Before Eating. 4)  Ms Contin 72 Mg Xr12h-Tab (Morphine Sulfate) .... Take 1 Tablet By Mouth Three Times A Day 5)  Nabumetone 500 Mg Tabs (Nabumetone) .... Take 1 Tablet By Mouth Two Times A Day 6)  Flexeril 10 Mg Tabs (Cyclobenzaprine Hcl) .... As Needed By Mouth Muscle Spasms 7)  Epipen 0.3 Mg/0.81ml (1:1000) Devi (Epinephrine Hcl (Anaphylaxis)) .... For Severe Allergic Reaction 8)  Ventolin Hfa 108 (90 Base) Mcg/act Aers (Albuterol Sulfate) .... 2 Puffs Every 4 Hours As Needed 9)  Lamictal 200 Mg Tabs (Lamotrigine) .... Take 1 Tab By Mouth At Bedtime 10)  Ambien 10 Mg Tabs (Zolpidem Tartrate) .... 1/2 Tab By Mouth At Bedtime As Needed 11)  Saphria 5mg  .... At Bedtime 12)  Allergy Vaccine New Start Build Gh 13)  Alprazolam 0.25 Mg Tabs (Alprazolam) .... Take 1 Tablet By Mouth Every 6 Hours  Allergies (verified): 1)  ! Pcn 2)  ! Stadol 3)  ! Lasix 4)  ! Iodine 5)  ! Elavil 6)  ! * Shellfish  Past History:  Past medical, surgical, family and social histories (including risk factors) reviewed, and no changes noted (except as noted below).  Past Medical History: Reviewed history from 10/11/2008 and no changes required.  Status Ashtmaticus--admission 06/01/08 with VDRF Allergic rhinoconjunctivitis- ------ Skin Test Pos 10/10/08 HYPERGLYCEMIA  (ICD-790.29) Headache ANXIETY (ICD-300.00) DEPRESSION (ICD-311) BACK PAIN, CHRONIC (ICD-724.5)--chronic DDD on disablility ARTHRITIS (ICD-716.90) OBSTRUCTIVE SLEEP APNEA (ICD-327.23)- never CPAP [facility] fibromyalgia Allergy- shellfish Anemia  Past Surgical History: Reviewed history from 09/20/2008 and no changes required. Appendectomy in 1987 C-section in 1988 Cholecystectomy in 1990 Foot surgery in 1995 Tubal ligation Hysterectomy in 2000 hernia repair  Family History: Reviewed history from 10/11/2008 and no changes required. Family History Ovarian Cancer--mother ( deceased at age28) Father deceased in his 73's of unknown causes. many with "allergy" Family History of Heart Disease: Mother, Father-deceased MI, siblings  Social History: Reviewed history from 10/11/2008 and no changes required. 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 2 children Patient never smoked.   Review of Systems       The patient complains of shortness of breath with activity, shortness of breath at rest, productive cough, non-productive cough, acid heartburn, and indigestion.  The patient denies coughing up blood, chest pain, irregular heartbeats, loss of appetite, weight change, abdominal pain, difficulty swallowing, sore throat, tooth/dental  problems, headaches, nasal congestion/difficulty breathing through nose, sneezing, itching, ear ache, anxiety, depression, hand/feet swelling, joint stiffness or pain, rash, change in color of mucus, and fever.    Vital Signs:  Patient profile:   50 year old female Height:      69 inches Weight:      249.50 pounds O2 Sat:      93 % on Room air Temp:     98.1 degrees F oral Pulse rate:   107 / minute BP sitting:   118 / 74  (right arm) Cuff size:   large  Vitals Entered By: Zackery Barefoot CMA (April 02, 2009 2:26 PM)  O2 Flow:  Room air CC: Pt here for follow up. Pt states has been to  ER due to chest pains, tachycardia, and S.O.B Comments Medications reviewed with patient Zackery Barefoot CMA  April 02, 2009 2:26 PM    Physical Exam  Additional Exam:  Gen: Pleasant, well-nourished, in no distress , normal affect, ENT: no lesions, no post nasal drip,  mild nasal mucosal erythema, clear drainage.  Melampatti II Neck: No JVD, no TMG, no carotid bruits Lungs: Coarse BS w/ exp wheeizng, No use of accessory muscles, no dullness to percussion,   Cardiovascular: rapid regular, heart sounds normal, no murmurs or gallops, no peripheral edema ABD: soft, obese, BS +x4 Q EXT : no edema, skin intact, no lesions noted     Pre-Spirometry FEV1    Value: 2.23 L     Pred: 3.06 L     Impression & Recommendations:  Problem # 1:  VOCAL CORD DISORDER (ICD-478.5) Assessment Deteriorated Vocal cord dysfunction exacerbated by GERD plan speech pathology evaluation/treatment Orders: Est. Patient Level V (45409) Speech Therapy (Speech Therapy)  Problem # 2:  ASTHMA (ICD-493.90) Assessment: Unchanged Severe persistent asthma , severe atopy with elevated IgE levels, advair likely irritating upper airway at this time. The pt agrees to proceed with xolair therapy. GERD is also a major trigger here   plan Stop Advair Start Dulera two puffs twice daily Start Xolair Stop omeprazole Start Dexilant one daily Return 6 weeks  Medications Added to Medication List This Visit: 1)  Dulera 200-5 Mcg/act Aero (Mometasone furo-formoterol fum) .... 2 puffs twice per day 2)  Dexilant 60 Mg Cpdr (Dexlansoprazole) .... One by mouth daily 3)  Alprazolam 0.25 Mg Tabs (Alprazolam) .... Take 1 tablet by mouth every 6 hours 4)  Alprazolam 0.25 Mg Tabs (Alprazolam) .... Take 1 tablet by mouth every 6 hours as needed  Complete Medication List: 1)  Dulera 200-5 Mcg/act Aero (Mometasone furo-formoterol fum) .... 2 puffs twice per day 2)  Neurontin 400 Mg Caps (Gabapentin) .Marland Kitchen.. 1 by mouth four times  daily 3)  Dexilant 60 Mg Cpdr (Dexlansoprazole) .... One by mouth daily 4)  Ms Contin 46 Mg Xr12h-tab (Morphine sulfate) .... Take 1 tablet by mouth three times a day 5)  Nabumetone 500 Mg Tabs (Nabumetone) .... Take 1 tablet by mouth two times a day 6)  Flexeril 10 Mg Tabs (Cyclobenzaprine hcl) .... As needed by mouth muscle spasms 7)  Epipen 0.3 Mg/0.24ml (1:1000) Devi (Epinephrine hcl (anaphylaxis)) .... For severe allergic reaction 8)  Ventolin Hfa 108 (90 Base) Mcg/act Aers (Albuterol sulfate) .... 2 puffs every 4 hours as needed 9)  Lamictal 200 Mg Tabs (Lamotrigine) .... Take 1 tab by mouth at bedtime 10)  Ambien 10 Mg Tabs (Zolpidem tartrate) .... 1/2 tab by mouth at bedtime as needed 11)  Saphria 5mg   .... At  bedtime 12)  Allergy Vaccine New Start Build Gh  13)  Alprazolam 0.25 Mg Tabs (Alprazolam) .... Take 1 tablet by mouth every 6 hours as needed  Other Orders: Misc. Referral (Misc. Ref)   Patient Instructions: 1)  Stop Advair 2)  Start Dulera two puffs twice daily 3)  Start Xolair 4)  Stop omeprazole 5)  Start Dexilant one daily 6)  A referral to Speech pathology will be made 7)  Return 6 weeks Prescriptions: ALPRAZOLAM 0.25 MG TABS (ALPRAZOLAM) Take 1 tablet by mouth every 6 hours as needed  #90 x 3   Entered and Authorized by:   Storm Frisk MD   Signed by:   Storm Frisk MD on 04/02/2009   Method used:   Print then Give to Patient   RxID:   [id number] DEXILANT 60 MG CPDR (DEXLANSOPRAZOLE) One by mouth daily Brand medically necessary #30 x 6   Entered and Authorized by:   Storm Frisk MD   Signed by:   Storm Frisk MD on 04/02/2009   Method used:   Electronically to        [facility]  [address] [phone]* (retail)       [address]       [place], [place]  [id number]       Ph: [phone] or [phone]       Fax: [phone]   RxID:   [id number] DULERA 200-5 MCG/ACT AERO (MOMETASONE FURO-FORMOTEROL FUM) 2  puffs twice per day Brand medically necessary #1 x 6   Entered and Authorized by:   Storm Frisk MD   Signed by:   Storm Frisk MD on 04/02/2009   Method used:   [facility] to        [facility]  [address] [phone]* (retail)       [address]       [place], [place]  [id number]       Ph: [phone] or [phone]       Fax: [phone]   RxID:   [id number]   Appended Document: Pulmonary OV fax Kathe Mariner

## 2010-08-05 NOTE — Progress Notes (Signed)
Summary: appt  Phone Note Call from Patient Call back at Home Phone [phone]   Caller: Patient Call For: Ramella Reason for Call: Talk to Nurse Summary of Call: woke up sob, asthma flare up in last 24 hours, suppose to restart xolair today, should she still come?  Does she need to be seen?  Per Dimas Millin, pt will have to wait 2 hours after shot. Initial call taken by: Eugene Gavia,  Nov 05, 2009 8:57 AM  Follow-up for Phone Call        called and spoke with pt.  pt states she is scheduled for Xolair injection today.  Pt states she last had a Xolair injection Nov 2010 and denies any complications with that injection.  Pt c/o increased sob with exertion x last 24 hrs.  Pt c/o tightness in chest and wheezing.  Pt states she is using rescue hfa every 2 hours.  (Still takes [facility] two times a day as directed )  Pt denied a cough.  Pt wanted to know if she is still ok to get Xolair Injection today at 11:30am.  PW out of office today.  Will forward message to doc of the day to address.  Arman Filter LPN  Nov 05, 2949 9:30 AM   Additional Follow-up for Phone Call Additional follow up Details #1::        per CDY: do not take xolair today, needs ov w/ TP and eval when to restart xolair.  called spoke with patient.  appt made w/ TP this morning at 1045.  pt okay with this date and time.  asl cancelled xolair appt for today per CDY.    Additional Follow-up by: Boone Master CNA,  Nov 05, 2009 9:42 AM

## 2010-08-05 NOTE — Assessment & Plan Note (Signed)
Summary: Acute NP office visit - asthma   Copy to:  [name], MD Primary Provider/Referring Provider:  Juleen Starr.  CC:  increased SOB, wheezing and dizziness onset last night - denies cough, and f/c/s - currently taking 2 tabs daily 10mg  pred taper.  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    June 14, 2008- Previous pt of Dr. Delford Field, last seen > 5years ago. -Presents for post hospital follow up. Admitted 11/27-12/1/09 for status asthmaticus with vent dependent resp. failure. Pt has a history of  asthma since childhood who presented with 5-6 hours of progressive  dyspnea and required intubation upon arrival to the emergency  department,  treated with systemic  steroids, nebulized bronchodilators, mechanical ventilatory support, DVT  prophylaxis, stress ulcer prophylaxis, and glycemic control.  She  responded well to these therapies.  She extubated herself in the early  morning hours of June 03, 2008, without problems.    09/12/08- asthma flare- tx w/ steroids. referral to allergy and GI   10/10/08- Allergy evaluation- asthma- Tree pollen heavy now and distinctly bothering eyes and nose itch and drain. Feels wheezier. Here for allergy testing- msses antihistamines. Skin tests- Strongly POS grass, weed, tree, dust    Nov 09, 2008-- acute office viist. Complains of increased SOB, wheezing, prod cough with brownish-colored mucus onset last night - denies f/c/s. Assures  is taking all her meds w/o missed doses. Has seen some blood streaked mucous    September 27, 2009 ccAcute visit.  Pt was seen at [facility] ER last night with increased SOB and left side chest and abdominal pain.  She was told that she may have possible blood clot b/c of elevated d-dimer.  She states that her CP only occurs upon inspration.  Gets SOB walking from room to room =  baseline.  new onset epigastric and LLQ pain x 1 week  better lying down and worse sitting.  cp worse with deep breath, abd pain worse with coughing.  no change in bowels better with xanax.  Never present supine.  No n or v.   October 11, 2009--Presents for a follow up and med reivew. est med calendar - pt did not bring her meds with her today.  pt c/o pain on left side at groin area, SOB and "some wheezing" x1week. Last visit had atypical chest/abd pain suspected to be IBS. Cardiac enzymes neg. Neg w/up for PE and ESR and BNP normal. She has had an extensive work up for dyspnea/DOE/ and atypical chest pain. over last 6 months she has had numerous chest xray-NAD , CT chest-neg for PE and VQ scan x 2 w/  low probalbility for PE. 2 D ECHO 10/10 showed nml LVF w/ EF at 55-60% no PFO noted. Sleep test showed only mild OSA -no CPAP indicated. Last visit she did desat with walking but with quick recovery. Unfortuanetly did not bring in meds today. We reviewed her list and she is on multiple narcotics, pschy meds.   October 29, 2009 10:24 AM The pt continues with dyspnea and uses ventolin every 4 hours.  She feels the dulera helped.  She is on the [place].  She has been back and forth to ED for wheezing and chest pain.  She had neg w/u for PE and MI.  See data from 4/8 NP OV.  She has not followed through on  Xolair and allergy immunotherapy due to inability to afford injection copay but now has medicaid and is willing to try again with both therapies. No cpap needed on sleep study.   Nov 05, 2009 --Presents for acute office visit. Complains of increased SOB, wheezing and dizziness onset last night - denies cough, f/c/s - currently taking 2 tabs daily 10mg  pred taper. Prednisone was helping until went down on lower dose, last night wheezing returned w/ increase albuterol use. last month at er - xray neg, VQ low prob. , CT 9.10,neg for pulmonary emboli. Denies chest pain, orthopnea, hemoptysis, fever, n/v/d, edema,  headache. Not taking dexilant, cant afford.   Medications Prior to Update: 1)  Dulera 200-5 Mcg/act Aero (Mometasone Furo-Formoterol Fum) .... 2 Puffs Twice Per Day 2)  Neurontin 600 Mg Tabs (Gabapentin) .... Two Times A Day 3)  Dexilant 60 Mg Cpdr (Dexlansoprazole) .... One By Mouth Daily 4)  Ms Contin 30 Mg Xr12h-Tab (Morphine Sulfate) .... Every 8 Hours 5)  Nabumetone 500 Mg Tabs (Nabumetone) .... Take 1 Tablet By Mouth Two Times A Day 6)  Flexeril 10 Mg Tabs (Cyclobenzaprine Hcl) .... Three Times A Day 7)  Epipen 0.3 Mg/0.72ml (1:1000) Devi (Epinephrine Hcl (Anaphylaxis)) .... For Severe Allergic Reaction 8)  Ventolin Hfa 108 (90 Base) Mcg/act Aers (Albuterol Sulfate) .... 2 Puffs Every 4 Hours As Needed 9)  Lamictal 100 Mg Tabs (Lamotrigine) .... 3 At Bedtime 10)  Ambien 10 Mg Tabs (Zolpidem Tartrate) .Marland Kitchen.. 1 Tab By Mouth At Bedtime As Needed 11)  Saphris 5 Mg Subl (Asenapine Maleate) .Marland Kitchen.. 1 Under Tongue At Bedtime 12)  Alprazolam 0.25 Mg Tabs (Alprazolam) .... Take 1 Tablet By Mouth Every 6 Hours As Needed 13)  Metoprolol Succinate 25 Mg Xr24h-Tab (Metoprolol Succinate) .... Take 1 Tablet By Mouth Once A Day 14)  Pepcid 20 Mg Tabs (Famotidine) .Marland Kitchen.. 1 By Mouth At Bedtime 15)  Citrucel  Powd (Methylcellulose (Laxative)) .... Two Times A Day 16)  Bupropion Hcl 300 Mg Xr24h-Tab (Bupropion Hcl) .... Once Daily 17)  Zaleplon 10 Mg Caps (Zaleplon) .... At Bedtime As Needed 18)  Cymbalta 60 Mg Cpep (Duloxetine Hcl) .... Once Daily 19)  Prednisone 10 Mg  Tabs (Prednisone) .... Take As Directed 4 Each Am X 4 Days, 3 X 4 Days, 2 X 4 Days, 1 X 4 Days Then Stop  Current Medications (verified): 1)  Dulera 200-5 Mcg/act Aero (Mometasone Furo-Formoterol Fum) .... 2 Puffs Twice Per Day 2)  Neurontin 600 Mg Tabs (Gabapentin) .... Take 1 Tablet By Mouth Three Times A Day 3)  Dexilant 60 Mg Cpdr (Dexlansoprazole) .... One By Mouth Daily 4)  Ms Contin 30 Mg Xr12h-Tab (Morphine Sulfate) .... Every 8  Hours 5)  Nabumetone 500 Mg Tabs (Nabumetone) .... Take 1 Tablet By Mouth Two Times A Day 6)  Flexeril 10 Mg Tabs (Cyclobenzaprine Hcl) .... Three Times A Day 7)  Epipen 0.3 Mg/0.12ml (1:1000) Devi (Epinephrine Hcl (Anaphylaxis)) .... For Severe Allergic Reaction 8)  Ventolin Hfa 108 (90 Base) Mcg/act Aers (Albuterol Sulfate) .... 2 Puffs Every 4 Hours As Needed 9)  Lamictal 100 Mg Tabs (Lamotrigine) .... 3 At Bedtime 10)  Ambien 10 Mg Tabs (Zolpidem Tartrate) .Marland Kitchen.. 1 Tab By Mouth At Bedtime As Needed 11)  Saphris 5 Mg Subl (Asenapine Maleate) .Marland Kitchen.. 1 Under Tongue At Bedtime 12)  Alprazolam 0.25 Mg Tabs (Alprazolam) .... Take 1 Tablet By Mouth Every 6 Hours As Needed 13)  Metoprolol Succinate 25 Mg Xr24h-Tab (Metoprolol Succinate) .Marland KitchenMarland KitchenMarland Kitchen  Take 1 Tablet By Mouth Once A Day 14)  Pepcid 20 Mg Tabs (Famotidine) .Marland Kitchen.. 1 By Mouth At Bedtime 15)  Citrucel  Powd (Methylcellulose (Laxative)) .... Two Times A Day 16)  Bupropion Hcl 300 Mg Xr24h-Tab (Bupropion Hcl) .... Once Daily 17)  Zaleplon 10 Mg Caps (Zaleplon) .... At Bedtime As Needed 18)  Cymbalta 60 Mg Cpep (Duloxetine Hcl) .... Once Daily 19)  Prednisone 10 Mg  Tabs (Prednisone) .... Take As Directed 4 Each Am X 4 Days, 3 X 4 Days, 2 X 4 Days, 1 X 4 Days Then Stop  Allergies (verified): 1)  ! Pcn 2)  ! Stadol 3)  ! Lasix 4)  ! Iodine 5)  ! Elavil 6)  ! * Shellfish 7)  ! * Latex  Past History:  Past Medical History: Last updated: 10/29/2009  Status Ashtmaticus--admission 06/01/08 with VDRF Allergic rhinoconjunctivitis- ------ Skin Test Pos 10/10/08 : Strongly POS grass, weed, tree, dust ?MS dx in 2004 in [place].  HYPERGLYCEMIA (ICD-790.29) Headache ANXIETY (ICD-300.00) DEPRESSION (ICD-311) BACK PAIN, CHRONIC (ICD-724.5)--chronic DDD on disablility ARTHRITIS (ICD-716.90) OBSTRUCTIVE SLEEP APNEA (ICD-327.23)- never CPAP  not indicated on Sleep study.............Marland KitchenMarland KitchenDyke Brackett R D fibromyalgia Allergy- shellfish Anemia  Pneumovax --April 15, 2009  flu shot 04/2009  Past Surgical History: Last updated: 09/20/2008 Appendectomy in 1987 C-section in 1988 Cholecystectomy in 1990 Foot surgery in 1995 Tubal ligation Hysterectomy in 2000 hernia repair  Family History: Last updated: 05/13/2009 Family History Ovarian Cancer--mother ( deceased at age28) Father deceased in his 82's of unknown causes. 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 2 children Patient never smoked.  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 No Comments Medications reviewed with patient Daytime contact number verified with patient. Boone Master CNA  Nov 05, 2009 11:41 AM    Physical Exam  Additional Exam:  Gen. Pleasant, well-nourished, in no distress ENT - no lesions, no post nasal drip, class 2 airway Neck: No JVD, no thyromegaly, no carotid bruits Lungs: coarse BS , faint wheeze Cardiovascular: Rhythm regular, heart sounds  normal, no murmurs or gallops, no peripheral edema Abdomen: soft and non-tender, no hepatosplenomegaly, BS normal. Musculoskeletal: No deformities, no cyanosis or clubbing Neuro:  alert, non focal     Impression &  Recommendations:  Problem # 1:  EXTRINSIC ASTHMA, UNSPECIFIED (ICD-493.00) Slow to resolve exacerbation REC:  Extend prednisone taper  as directed.  Begin Spiriva once daily  USe Prilosec 20mg  once daily before meal in place of Dexilant.  Please contact office  for sooner follow up if symptoms do not improve or worsen  follow up Dr. Delford Field in 3 weeks as scheduled and as needed   Medications Added to Medication List This Visit: 1)  Neurontin 600 Mg Tabs (Gabapentin) .... Take 1 tablet by mouth three times a day 2)  Prednisone 10 Mg Tabs (Prednisone) .... 4 tabs for 4 days, then 3 tabs for 4 days, 2 tabs for 4 days, then 1 tab for 4 days, then stop 3)  Spiriva Handihaler 18 Mcg Caps (Tiotropium bromide monohydrate) .Marland Kitchen.. 1 puff once daily  Complete Medication List: 1)  Dulera 200-5 Mcg/act Aero (Mometasone furo-formoterol fum) .... 2 puffs twice per day 2)  Neurontin 600 Mg Tabs (Gabapentin) .... Take 1 tablet by mouth three times a day 3)  Dexilant 60 Mg Cpdr (Dexlansoprazole) .... One by mouth daily 4)  Ms Contin 30 Mg Xr12h-tab (Morphine sulfate) .... Every 8 hours 5)  Nabumetone 500 Mg Tabs (Nabumetone) .... Take 1 tablet by mouth two times a day 6)  Flexeril 10 Mg Tabs (Cyclobenzaprine hcl) .... Three times a day 7)  Epipen 0.3 Mg/0.12ml (1:1000) Devi (Epinephrine hcl (anaphylaxis)) .... For severe allergic reaction 8)  Ventolin Hfa 108 (90 Base) Mcg/act Aers (Albuterol sulfate) .... 2 puffs every 4 hours as needed 9)  Lamictal 100 Mg Tabs (Lamotrigine) .... 3 at bedtime 10)  Ambien 10 Mg Tabs (Zolpidem tartrate) .Marland Kitchen.. 1 tab by mouth at bedtime as needed 11)  Saphris 5 Mg Subl (Asenapine maleate) .Marland Kitchen.. 1 under tongue at bedtime 12)  Alprazolam 0.25 Mg Tabs (Alprazolam) .... Take 1 tablet by mouth every 6 hours as needed 13)  Metoprolol Succinate 25 Mg Xr24h-tab (Metoprolol succinate) .... Take 1 tablet by mouth once a day 14)  Pepcid 20 Mg Tabs (Famotidine) .Marland Kitchen.. 1 by mouth at bedtime 15)   Citrucel Powd (Methylcellulose (laxative)) .... Two times a day 16)  Bupropion Hcl 300 Mg Xr24h-tab (Bupropion hcl) .... Once daily 17)  Zaleplon 10 Mg Caps (Zaleplon) .... At bedtime as needed 18)  Cymbalta 60 Mg Cpep (Duloxetine hcl) .... Once daily 19)  Prednisone 10 Mg Tabs (Prednisone) .... Take as directed 4 each am x 4 days, 3 x 4 days, 2 x 4 days, 1 x 4 days then stop 20)  Prednisone 10 Mg Tabs (Prednisone) .... 4 tabs for 4 days, then 3 tabs for 4 days, 2 tabs for 4 days, then 1 tab for 4 days, then stop 21)  Spiriva Handihaler 18 Mcg Caps (Tiotropium bromide monohydrate) .Marland Kitchen.. 1 puff once daily  Other Orders: Nebulizer Tx (56213) Est. Patient Level IV (08657)  Patient Instructions: 1)  Extend prednisone taper  as directed.  2)  Begin Spiriva once daily  3)  USe Prilosec 20mg  once daily before meal in place of Dexilant.  4)  Please contact office for sooner follow up if symptoms do not improve or worsen  5)  follow up Dr. Delford Field in 3 weeks as scheduled and as needed  Prescriptions: PREDNISONE 10 MG TABS (PREDNISONE) 4 tabs for 4 days, then 3 tabs for 4 days, 2 tabs for 4 days, then 1 tab for 4 days, then stop  #40 x 0   Entered and Authorized by:   Rubye Oaks NP   Signed by:   Madeleine Fenn NP on 11/05/2009   Method used:   [facility]        [facility] [phone]* (retail)       [address]  [address]       [place], [place]  [id number]       Ph: [phone] or [phone]       Fax: [phone]   RxID:   [id number]

## 2010-08-08 NOTE — Miscellaneous (Signed)
Summary: Injection Orders / [facility]    Injection Orders / [facility]    Imported By: Lennie Odor 12/03/2009 15:33:08  _____________________________________________________________________  External Attachment:    Type:   Image     Comment:   External Document

## 2010-08-08 NOTE — Medication Information (Signed)
Summary: [facility]am [id number]  [facility]am [id number]   Imported By: Lennie Odor 01/23/2010 15:29:04  _____________________________________________________________________  External Attachment:    Type:   Image     Comment:   External Document

## 2010-08-14 ENCOUNTER — Telehealth (INDEPENDENT_AMBULATORY_CARE_PROVIDER_SITE_OTHER): Payer: Self-pay | Admitting: *Deleted

## 2010-08-21 NOTE — Progress Notes (Signed)
Summary: Alprazolam refill request  Phone Note Refill Request Message from:  Fax from Pharmacy on August 14, 2010 11:41 AM  Refills Requested: Medication #1:  ALPRAZOLAM 0.25 MG TABS Take 1 tablet by mouth every 6 hours as needed   Dosage confirmed as above?Dosage Confirmed   Brand Name Necessary? No   Supply Requested: 1 month   Last Refilled: 07/07/2010   Notes: Dr[facility]elford [facility] [address]  (p) [phone]   Method Requested: Telephone to Pharmacy Next Appointment Scheduled: no pending appointments Initial call taken by: Michel Bickers CMA,  August 14, 2010 11:43 AM  Follow-up for Phone Call        Pls advise if okay for refills. Michel Bickers [facility]  August 14, 2010 11:43 AM  Additional Follow-up for Phone Call Additional follow up Details #1::        this is ok Additional Follow-up by: Storm Frisk MD,  August 14, 2010 12:02 PM    Prescriptions: ALPRAZOLAM 0.25 MG TABS (ALPRAZOLAM) Take 1 tablet by mouth every 6 hours as needed  #90 x 0   Entered by:   Vernie Murders   Authorized by:   Storm Frisk MD   Signed by:   Vernie Murders on 08/14/2010   Method used:   Telephoned to ...       [facility]  [address] [phone]* (retail)       [address]       [place], [place]  [id number]       Ph: [phone] or [phone]       Fax: [phone]   RxID:   [id number]

## 2010-09-02 NOTE — Miscellaneous (Signed)
Summary: Injection Record / [facility]   Injection Record / [facility]   Imported By: Lennie Odor 08/29/2010 13:58:38  _____________________________________________________________________  External Attachment:    Type:   Image     Comment:   External Document

## 2010-09-17 LAB — BASIC METABOLIC PANEL
BUN: 4 mg/dL — ABNORMAL LOW (ref 6–23)
CO2: 28 mEq/L (ref 19–32)
Calcium: 9.3 mg/dL (ref 8.4–10.5)
Chloride: 102 mEq/L (ref 96–112)
Creatinine, Ser: 0.94 mg/dL (ref 0.4–1.2)
GFR calc Af Amer: >60 mL/min (ref >60)
GFR calc non Af Amer: >60 mL/min (ref >60)
Glucose, Bld: 119 mg/dL — ABNORMAL HIGH (ref 70–99)
Potassium: 3.5 mEq/L (ref 3.5–5.1)
Sodium: 142 mEq/L (ref 135–145)

## 2010-09-17 LAB — POCT CARDIAC MARKERS
CKMB, poc: <1 ng/mL — ABNORMAL LOW (ref 1.0–8.0)
Myoglobin, poc: 179 ng/mL (ref 12–200)
Troponin i, poc: <0.05 ng/mL (ref 0.00–0.09)

## 2010-09-17 LAB — CBC
HCT: 42.8 % (ref 36.0–46.0)
Hemoglobin: 14.2 g/dL (ref 12.0–15.0)
MCH: 27.5 pg (ref 26.0–34.0)
MCHC: 33.2 g/dL (ref 30.0–36.0)
MCV: 82.8 fL (ref 78.0–100.0)
Platelets: 307 10*3/uL (ref 150–400)
RBC: 5.17 MIL/uL — ABNORMAL HIGH (ref 3.87–5.11)
RDW: 15.6 % — ABNORMAL HIGH (ref 11.5–15.5)
WBC: 7.2 10*3/uL (ref 4.0–10.5)

## 2010-09-17 LAB — DIFFERENTIAL
Basophils Absolute: 0.1 10*3/uL (ref 0.0–0.1)
Basophils Relative: 1 % (ref 0–1)
Eosinophils Absolute: 0.7 10*3/uL (ref 0.0–0.7)
Eosinophils Relative: 10 % — ABNORMAL HIGH (ref 0–5)
Lymphocytes Relative: 41 % (ref 12–46)
Lymphs Abs: 3 10*3/uL (ref 0.7–4.0)
Monocytes Absolute: 0.6 10*3/uL (ref 0.1–1.0)
Monocytes Relative: 9 % (ref 3–12)
Neutro Abs: 2.8 10*3/uL (ref 1.7–7.7)
Neutrophils Relative %: 39 % — ABNORMAL LOW (ref 43–77)

## 2010-09-21 ENCOUNTER — Emergency Department (HOSPITAL_COMMUNITY): Payer: Medicare Other

## 2010-09-21 ENCOUNTER — Emergency Department (HOSPITAL_COMMUNITY)
Admission: EM | Admit: 2010-09-21 | Discharge: 2010-09-21 | Disposition: A | Discharge status: Home / Self Care | Payer: Medicare Other | Attending: Emergency Medicine | Admitting: Emergency Medicine

## 2010-09-21 DIAGNOSIS — K7689 Other specified diseases of liver: Secondary | ICD-10-CM | POA: Insufficient documentation

## 2010-09-21 DIAGNOSIS — R0602 Shortness of breath: Secondary | ICD-10-CM | POA: Insufficient documentation

## 2010-09-21 DIAGNOSIS — Z90710 Acquired absence of both cervix and uterus: Secondary | ICD-10-CM | POA: Insufficient documentation

## 2010-09-21 DIAGNOSIS — R079 Chest pain, unspecified: Secondary | ICD-10-CM | POA: Insufficient documentation

## 2010-09-21 DIAGNOSIS — Z79899 Other long term (current) drug therapy: Secondary | ICD-10-CM | POA: Insufficient documentation

## 2010-09-21 DIAGNOSIS — F341 Dysthymic disorder: Secondary | ICD-10-CM | POA: Insufficient documentation

## 2010-09-21 DIAGNOSIS — R071 Chest pain on breathing: Secondary | ICD-10-CM | POA: Insufficient documentation

## 2010-09-21 DIAGNOSIS — R1032 Left lower quadrant pain: Secondary | ICD-10-CM | POA: Insufficient documentation

## 2010-09-21 DIAGNOSIS — Z9089 Acquired absence of other organs: Secondary | ICD-10-CM | POA: Insufficient documentation

## 2010-09-21 LAB — CBC
HCT: 42.7 % (ref 36.0–46.0)
Hemoglobin: 14 g/dL (ref 12.0–15.0)
MCH: 27.9 pg (ref 26.0–34.0)
MCHC: 32.8 g/dL (ref 30.0–36.0)
MCV: 85.2 fL (ref 78.0–100.0)
Platelets: 341 10*3/uL (ref 150–400)
RBC: 5.01 MIL/uL (ref 3.87–5.11)
RDW: 15 % (ref 11.5–15.5)
WBC: 6.9 10*3/uL (ref 4.0–10.5)

## 2010-09-21 LAB — COMPREHENSIVE METABOLIC PANEL
ALT: 202 U/L — ABNORMAL HIGH (ref 0–35)
AST: 139 U/L — ABNORMAL HIGH (ref 0–37)
Albumin: 3.5 g/dL (ref 3.5–5.2)
Alkaline Phosphatase: 201 U/L — ABNORMAL HIGH (ref 39–117)
BUN: 9 mg/dL (ref 6–23)
CO2: 27 mEq/L (ref 19–32)
Calcium: 9.3 mg/dL (ref 8.4–10.5)
Chloride: 102 mEq/L (ref 96–112)
Creatinine, Ser: 0.89 mg/dL (ref 0.4–1.2)
GFR calc Af Amer: >60 mL/min (ref >60)
GFR calc non Af Amer: >60 mL/min (ref >60)
Glucose, Bld: 104 mg/dL — ABNORMAL HIGH (ref 70–99)
Potassium: 4 mEq/L (ref 3.5–5.1)
Sodium: 137 mEq/L (ref 135–145)
Total Bilirubin: 0.9 mg/dL (ref 0.3–1.2)
Total Protein: 7.7 g/dL (ref 6.0–8.3)

## 2010-09-21 LAB — PROTIME-INR
INR: 0.89 (ref 0.00–1.49)
Prothrombin Time: 12.2 seconds (ref 11.6–15.2)

## 2010-09-21 LAB — POCT CARDIAC MARKERS
CKMB, poc: <1 ng/mL — ABNORMAL LOW (ref 1.0–8.0)
CKMB, poc: <1 ng/mL — ABNORMAL LOW (ref 1.0–8.0)
Myoglobin, poc: 112 ng/mL (ref 12–200)
Myoglobin, poc: 140 ng/mL (ref 12–200)
Troponin i, poc: <0.05 ng/mL (ref 0.00–0.09)
Troponin i, poc: <0.05 ng/mL (ref 0.00–0.09)

## 2010-09-21 LAB — DIFFERENTIAL
Basophils Absolute: 0 10*3/uL (ref 0.0–0.1)
Basophils Relative: 1 % (ref 0–1)
Eosinophils Absolute: 0.7 10*3/uL (ref 0.0–0.7)
Eosinophils Relative: 10 % — ABNORMAL HIGH (ref 0–5)
Lymphocytes Relative: 50 % — ABNORMAL HIGH (ref 12–46)
Lymphs Abs: 3.4 10*3/uL (ref 0.7–4.0)
Monocytes Absolute: 0.5 10*3/uL (ref 0.1–1.0)
Monocytes Relative: 7 % (ref 3–12)
Neutro Abs: 2.3 10*3/uL (ref 1.7–7.7)
Neutrophils Relative %: 34 % — ABNORMAL LOW (ref 43–77)

## 2010-09-21 LAB — URINALYSIS, ROUTINE W REFLEX MICROSCOPIC
Bilirubin Urine: NEGATIVE
Glucose, UA: NEGATIVE mg/dL
Hgb urine dipstick: NEGATIVE
Ketones, ur: NEGATIVE mg/dL
Nitrite: NEGATIVE
Protein, ur: NEGATIVE mg/dL
Specific Gravity, Urine: 1.026 (ref 1.005–1.030)
Urobilinogen, UA: 0.2 mg/dL (ref 0.0–1.0)
pH: 6.5 (ref 5.0–8.0)

## 2010-09-21 LAB — LIPASE, BLOOD: Lipase: 21 U/L (ref 11–59)

## 2010-09-21 LAB — APTT: aPTT: 33 seconds (ref 24–37)

## 2010-09-21 MED ORDER — IOHEXOL 350 MG/ML SOLN
100.0000 mL | Freq: Once | INTRAVENOUS | Status: AC | PRN
  Administered 2010-09-21: 100 mL via INTRAVENOUS

## 2010-09-22 ENCOUNTER — Telehealth: Payer: Self-pay | Admitting: Critical Care Medicine

## 2010-09-25 ENCOUNTER — Other Ambulatory Visit: Payer: Self-pay | Admitting: Adult Health

## 2010-09-29 ENCOUNTER — Telehealth: Payer: Self-pay | Admitting: *Deleted

## 2010-09-29 LAB — POCT I-STAT, CHEM 8
BUN: 9 mg/dL (ref 6–23)
Calcium, Ion: 1.19 mmol/L (ref 1.12–1.32)
Chloride: 104 mEq/L (ref 96–112)
Creatinine, Ser: 1 mg/dL (ref 0.4–1.2)
Glucose, Bld: 79 mg/dL (ref 70–99)
HCT: 45 % (ref 36.0–46.0)
Hemoglobin: 15.3 g/dL — ABNORMAL HIGH (ref 12.0–15.0)
Potassium: 4 mEq/L (ref 3.5–5.1)
Sodium: 141 mEq/L (ref 135–145)
TCO2: 32 mmol/L (ref 0–100)

## 2010-09-29 LAB — URINALYSIS, ROUTINE W REFLEX MICROSCOPIC
Glucose, UA: NEGATIVE mg/dL
Hgb urine dipstick: NEGATIVE
Ketones, ur: 15 mg/dL — AB
Nitrite: NEGATIVE
Protein, ur: NEGATIVE mg/dL
Specific Gravity, Urine: 1.031 — ABNORMAL HIGH (ref 1.005–1.030)
Urobilinogen, UA: 0.2 mg/dL (ref 0.0–1.0)
pH: 5.5 (ref 5.0–8.0)

## 2010-09-29 LAB — URINE CULTURE: Colony Count: 65000

## 2010-09-29 LAB — DIFFERENTIAL
Basophils Absolute: 0 10*3/uL (ref 0.0–0.1)
Basophils Relative: 1 % (ref 0–1)
Eosinophils Absolute: 0.4 10*3/uL (ref 0.0–0.7)
Eosinophils Relative: 7 % — ABNORMAL HIGH (ref 0–5)
Lymphocytes Relative: 36 % (ref 12–46)
Lymphs Abs: 2.1 10*3/uL (ref 0.7–4.0)
Monocytes Absolute: 0.5 10*3/uL (ref 0.1–1.0)
Monocytes Relative: 9 % (ref 3–12)
Neutro Abs: 2.7 10*3/uL (ref 1.7–7.7)
Neutrophils Relative %: 47 % (ref 43–77)

## 2010-09-29 LAB — CBC
HCT: 41.6 % (ref 36.0–46.0)
Hemoglobin: 13.8 g/dL (ref 12.0–15.0)
MCHC: 33.2 g/dL (ref 30.0–36.0)
MCV: 84.8 fL (ref 78.0–100.0)
Platelets: 292 10*3/uL (ref 150–400)
RBC: 4.9 MIL/uL (ref 3.87–5.11)
RDW: 15.9 % — ABNORMAL HIGH (ref 11.5–15.5)
WBC: 5.7 10*3/uL (ref 4.0–10.5)

## 2010-09-29 LAB — D-DIMER, QUANTITATIVE: D-Dimer, Quant: 0.76 ug/mL-FEU — ABNORMAL HIGH (ref 0.00–0.48)

## 2010-09-29 NOTE — Telephone Encounter (Signed)
Refill request for Alprazolam 0.25mg  [facility]  [address]  Directions are 1 by mouth every 6 hours as needed Last filled on 08/24/2010.  Last OV w/ PW  01/22/2010.  Next OV scheduled for 10/06/2010 w/ TP. Please advise if okay to refill.

## 2010-09-29 NOTE — Telephone Encounter (Signed)
Ok to refill 

## 2010-10-02 ENCOUNTER — Encounter: Payer: Self-pay | Admitting: Adult Health

## 2010-10-02 MED ORDER — ALPRAZOLAM 0.25 MG PO TABS: ORAL_TABLET | ORAL | Status: DC

## 2010-10-02 NOTE — Telephone Encounter (Signed)
[facility] [address]

## 2010-10-02 NOTE — Progress Notes (Signed)
Summary: Needs OV this week  Phone Note Outgoing Call   Reason for Call: Discuss lab or test results Summary of Call: Pt called Call A Nurse this weekend, pt with acute onset dypsnea and advised to go to ED.  Call pt and see what is going on and get her an Appt this week with [facility]  Follow-up for Phone Call        Spoke with pt.  States feeling better today.  I advised we need t osee her for rov.  I sched her for first available with TP for 10/06/10 and advised that she call sooner if needed.  I advised pt to call sooner if needed and she verbalized understanding. Follow-up by: Vernie Murders,  September 22, 2010 3:55 PM

## 2010-10-06 ENCOUNTER — Ambulatory Visit: Payer: Medicare Other | Admitting: Adult Health

## 2010-10-08 LAB — URINE MICROSCOPIC-ADD ON

## 2010-10-08 LAB — POCT I-STAT, CHEM 8
BUN: 13 mg/dL (ref 6–23)
Calcium, Ion: 1.08 mmol/L — ABNORMAL LOW (ref 1.12–1.32)
Chloride: 105 mEq/L (ref 96–112)
Creatinine, Ser: 1.1 mg/dL (ref 0.4–1.2)
Glucose, Bld: 98 mg/dL (ref 70–99)
HCT: 47 % — ABNORMAL HIGH (ref 36.0–46.0)
Hemoglobin: 16 g/dL — ABNORMAL HIGH (ref 12.0–15.0)
Potassium: 3.2 mEq/L — ABNORMAL LOW (ref 3.5–5.1)
Sodium: 141 mEq/L (ref 135–145)
TCO2: 25 mmol/L (ref 0–100)

## 2010-10-08 LAB — URINALYSIS, ROUTINE W REFLEX MICROSCOPIC
Glucose, UA: NEGATIVE mg/dL
Ketones, ur: NEGATIVE mg/dL
Leukocytes, UA: NEGATIVE
Nitrite: NEGATIVE
Protein, ur: NEGATIVE mg/dL
Specific Gravity, Urine: 1.037 — ABNORMAL HIGH (ref 1.005–1.030)
Urobilinogen, UA: 1 mg/dL (ref 0.0–1.0)
pH: 5.5 (ref 5.0–8.0)

## 2010-10-08 LAB — URINE CULTURE
Colony Count: NO GROWTH
Culture: NO GROWTH

## 2010-10-08 LAB — CBC
HCT: 45 % (ref 36.0–46.0)
Hemoglobin: 15.2 g/dL — ABNORMAL HIGH (ref 12.0–15.0)
MCHC: 33.8 g/dL (ref 30.0–36.0)
MCV: 83.6 fL (ref 78.0–100.0)
Platelets: 311 10*3/uL (ref 150–400)
RBC: 5.39 MIL/uL — ABNORMAL HIGH (ref 3.87–5.11)
RDW: 15.4 % (ref 11.5–15.5)
WBC: 9.2 10*3/uL (ref 4.0–10.5)

## 2010-10-10 LAB — CBC
HCT: 40.8 % (ref 36.0–46.0)
HCT: 41 % (ref 36.0–46.0)
HCT: 41.2 % (ref 36.0–46.0)
HCT: 41.6 % (ref 36.0–46.0)
HCT: 41.8 % (ref 36.0–46.0)
HCT: 42 % (ref 36.0–46.0)
HCT: 42.2 % (ref 36.0–46.0)
Hemoglobin: 13.6 g/dL (ref 12.0–15.0)
Hemoglobin: 13.7 g/dL (ref 12.0–15.0)
Hemoglobin: 13.8 g/dL (ref 12.0–15.0)
Hemoglobin: 13.8 g/dL (ref 12.0–15.0)
Hemoglobin: 14 g/dL (ref 12.0–15.0)
Hemoglobin: 14.1 g/dL (ref 12.0–15.0)
Hemoglobin: 14.1 g/dL (ref 12.0–15.0)
MCHC: 33.1 g/dL (ref 30.0–36.0)
MCHC: 33.2 g/dL (ref 30.0–36.0)
MCHC: 33.3 g/dL (ref 30.0–36.0)
MCHC: 33.4 g/dL (ref 30.0–36.0)
MCHC: 33.5 g/dL (ref 30.0–36.0)
MCHC: 33.6 g/dL (ref 30.0–36.0)
MCHC: 33.9 g/dL (ref 30.0–36.0)
MCV: 84.6 fL (ref 78.0–100.0)
MCV: 84.7 fL (ref 78.0–100.0)
MCV: 84.7 fL (ref 78.0–100.0)
MCV: 84.8 fL (ref 78.0–100.0)
MCV: 84.8 fL (ref 78.0–100.0)
MCV: 84.8 fL (ref 78.0–100.0)
MCV: 84.9 fL (ref 78.0–100.0)
Platelets: 266 10*3/uL (ref 150–400)
Platelets: 303 10*3/uL (ref 150–400)
Platelets: 308 10*3/uL (ref 150–400)
Platelets: 310 10*3/uL (ref 150–400)
Platelets: 325 10*3/uL (ref 150–400)
Platelets: 361 10*3/uL (ref 150–400)
Platelets: 412 10*3/uL — ABNORMAL HIGH (ref 150–400)
RBC: 4.81 MIL/uL (ref 3.87–5.11)
RBC: 4.85 MIL/uL (ref 3.87–5.11)
RBC: 4.86 MIL/uL (ref 3.87–5.11)
RBC: 4.92 MIL/uL (ref 3.87–5.11)
RBC: 4.93 MIL/uL (ref 3.87–5.11)
RBC: 4.95 MIL/uL (ref 3.87–5.11)
RBC: 4.97 MIL/uL (ref 3.87–5.11)
RDW: 14.3 % (ref 11.5–15.5)
RDW: 14.7 % (ref 11.5–15.5)
RDW: 14.8 % (ref 11.5–15.5)
RDW: 14.8 % (ref 11.5–15.5)
RDW: 15.1 % (ref 11.5–15.5)
RDW: 15.2 % (ref 11.5–15.5)
RDW: 15.2 % (ref 11.5–15.5)
WBC: 5.7 10*3/uL (ref 4.0–10.5)
WBC: 5.7 10*3/uL (ref 4.0–10.5)
WBC: 6.4 10*3/uL (ref 4.0–10.5)
WBC: 6.7 10*3/uL (ref 4.0–10.5)
WBC: 7.7 10*3/uL (ref 4.0–10.5)
WBC: 8.9 10*3/uL (ref 4.0–10.5)
WBC: 9.5 10*3/uL (ref 4.0–10.5)

## 2010-10-10 LAB — BASIC METABOLIC PANEL
BUN: 2 mg/dL — ABNORMAL LOW (ref 6–23)
BUN: 6 mg/dL (ref 6–23)
BUN: 6 mg/dL (ref 6–23)
BUN: 8 mg/dL (ref 6–23)
CO2: 23 mEq/L (ref 19–32)
CO2: 27 mEq/L (ref 19–32)
CO2: 27 mEq/L (ref 19–32)
CO2: 28 mEq/L (ref 19–32)
Calcium: 8.6 mg/dL (ref 8.4–10.5)
Calcium: 9.3 mg/dL (ref 8.4–10.5)
Calcium: 9.4 mg/dL (ref 8.4–10.5)
Calcium: 9.6 mg/dL (ref 8.4–10.5)
Chloride: 101 mEq/L (ref 96–112)
Chloride: 107 mEq/L (ref 96–112)
Chloride: 109 mEq/L (ref 96–112)
Chloride: 99 mEq/L (ref 96–112)
Creatinine, Ser: 0.89 mg/dL (ref 0.4–1.2)
Creatinine, Ser: 0.92 mg/dL (ref 0.4–1.2)
Creatinine, Ser: 0.97 mg/dL (ref 0.4–1.2)
Creatinine, Ser: 1.15 mg/dL (ref 0.4–1.2)
GFR calc Af Amer: >60 mL/min (ref >60)
GFR calc Af Amer: >60 mL/min (ref >60)
GFR calc Af Amer: >60 mL/min (ref >60)
GFR calc Af Amer: >60 mL/min (ref >60)
GFR calc non Af Amer: 51 mL/min — ABNORMAL LOW (ref >60)
GFR calc non Af Amer: >60 mL/min (ref >60)
GFR calc non Af Amer: >60 mL/min (ref >60)
GFR calc non Af Amer: >60 mL/min (ref >60)
Glucose, Bld: 159 mg/dL — ABNORMAL HIGH (ref 70–99)
Glucose, Bld: 187 mg/dL — ABNORMAL HIGH (ref 70–99)
Glucose, Bld: 93 mg/dL (ref 70–99)
Glucose, Bld: 99 mg/dL (ref 70–99)
Potassium: 3.5 mEq/L (ref 3.5–5.1)
Potassium: 3.7 mEq/L (ref 3.5–5.1)
Potassium: 3.8 mEq/L (ref 3.5–5.1)
Potassium: 4 mEq/L (ref 3.5–5.1)
Sodium: 138 mEq/L (ref 135–145)
Sodium: 139 mEq/L (ref 135–145)
Sodium: 140 mEq/L (ref 135–145)
Sodium: 141 mEq/L (ref 135–145)

## 2010-10-10 LAB — COMPREHENSIVE METABOLIC PANEL
ALT: 93 U/L — ABNORMAL HIGH (ref 0–35)
ALT: 130 U/L — ABNORMAL HIGH (ref 0–35)
AST: 37 U/L (ref 0–37)
AST: 54 U/L — ABNORMAL HIGH (ref 0–37)
Albumin: 3.6 g/dL (ref 3.5–5.2)
Albumin: 3.5 g/dL (ref 3.5–5.2)
Alkaline Phosphatase: 100 U/L (ref 39–117)
Alkaline Phosphatase: 103 U/L (ref 39–117)
BUN: 8 mg/dL (ref 6–23)
BUN: 11 mg/dL (ref 6–23)
CO2: 28 mEq/L (ref 19–32)
CO2: 30 mEq/L (ref 19–32)
Calcium: 9.7 mg/dL (ref 8.4–10.5)
Calcium: 9.4 mg/dL (ref 8.4–10.5)
Chloride: 102 mEq/L (ref 96–112)
Chloride: 100 mEq/L (ref 96–112)
Creatinine, Ser: 0.92 mg/dL (ref 0.4–1.2)
Creatinine, Ser: 0.91 mg/dL (ref 0.4–1.2)
GFR calc Af Amer: >60 mL/min (ref >60)
GFR calc Af Amer: >60 mL/min (ref >60)
GFR calc non Af Amer: >60 mL/min (ref >60)
GFR calc non Af Amer: >60 mL/min (ref >60)
Glucose, Bld: 123 mg/dL — ABNORMAL HIGH (ref 70–99)
Glucose, Bld: 103 mg/dL — ABNORMAL HIGH (ref 70–99)
Potassium: 3.9 mEq/L (ref 3.5–5.1)
Potassium: 3.8 mEq/L (ref 3.5–5.1)
Sodium: 139 mEq/L (ref 135–145)
Sodium: 139 mEq/L (ref 135–145)
Total Bilirubin: 0.7 mg/dL (ref 0.3–1.2)
Total Bilirubin: 0.5 mg/dL (ref 0.3–1.2)
Total Protein: 7.1 g/dL (ref 6.0–8.3)
Total Protein: 7.1 g/dL (ref 6.0–8.3)

## 2010-10-10 LAB — DIFFERENTIAL
Basophils Absolute: 0 10*3/uL (ref 0.0–0.1)
Basophils Absolute: 0 10*3/uL (ref 0.0–0.1)
Basophils Absolute: 0 10*3/uL (ref 0.0–0.1)
Basophils Absolute: 0.1 10*3/uL (ref 0.0–0.1)
Basophils Absolute: 0 10*3/uL (ref 0.0–0.1)
Basophils Absolute: 0.1 10*3/uL (ref 0.0–0.1)
Basophils Relative: 1 % (ref 0–1)
Basophils Relative: 1 % (ref 0–1)
Basophils Relative: 1 % (ref 0–1)
Basophils Relative: 2 % — ABNORMAL HIGH (ref 0–1)
Basophils Relative: 0 % (ref 0–1)
Basophils Relative: 1 % (ref 0–1)
Eosinophils Absolute: 0.4 10*3/uL (ref 0.0–0.7)
Eosinophils Absolute: 0.4 10*3/uL (ref 0.0–0.7)
Eosinophils Absolute: 0.5 10*3/uL (ref 0.0–0.7)
Eosinophils Absolute: 0.5 10*3/uL (ref 0.0–0.7)
Eosinophils Absolute: 0.2 10*3/uL (ref 0.0–0.7)
Eosinophils Absolute: 0.4 10*3/uL (ref 0.0–0.7)
Eosinophils Relative: 7 % — ABNORMAL HIGH (ref 0–5)
Eosinophils Relative: 6 % — ABNORMAL HIGH (ref 0–5)
Eosinophils Relative: 6 % — ABNORMAL HIGH (ref 0–5)
Eosinophils Relative: 7 % — ABNORMAL HIGH (ref 0–5)
Eosinophils Relative: 2 % (ref 0–5)
Eosinophils Relative: 7 % — ABNORMAL HIGH (ref 0–5)
Lymphocytes Relative: 28 % (ref 12–46)
Lymphocytes Relative: 26 % (ref 12–46)
Lymphocytes Relative: 30 % (ref 12–46)
Lymphocytes Relative: 38 % (ref 12–46)
Lymphocytes Relative: 20 % (ref 12–46)
Lymphocytes Relative: 24 % (ref 12–46)
Lymphs Abs: 1.6 10*3/uL (ref 0.7–4.0)
Lymphs Abs: 1.7 10*3/uL (ref 0.7–4.0)
Lymphs Abs: 1.9 10*3/uL (ref 0.7–4.0)
Lymphs Abs: 3.4 10*3/uL (ref 0.7–4.0)
Lymphs Abs: 1.1 10*3/uL (ref 0.7–4.0)
Lymphs Abs: 2.3 10*3/uL (ref 0.7–4.0)
Monocytes Absolute: 0.6 10*3/uL (ref 0.1–1.0)
Monocytes Absolute: 0.5 10*3/uL (ref 0.1–1.0)
Monocytes Absolute: 0.6 10*3/uL (ref 0.1–1.0)
Monocytes Absolute: 0.8 10*3/uL (ref 0.1–1.0)
Monocytes Absolute: 0.4 10*3/uL (ref 0.1–1.0)
Monocytes Absolute: 0.7 10*3/uL (ref 0.1–1.0)
Monocytes Relative: 10 % (ref 3–12)
Monocytes Relative: 12 % (ref 3–12)
Monocytes Relative: 7 % (ref 3–12)
Monocytes Relative: 8 % (ref 3–12)
Monocytes Relative: 7 % (ref 3–12)
Monocytes Relative: 8 % (ref 3–12)
Neutro Abs: 3.1 10*3/uL (ref 1.7–7.7)
Neutro Abs: 3.5 10*3/uL (ref 1.7–7.7)
Neutro Abs: 3.6 10*3/uL (ref 1.7–7.7)
Neutro Abs: 4.3 10*3/uL (ref 1.7–7.7)
Neutro Abs: 3.7 10*3/uL (ref 1.7–7.7)
Neutro Abs: 6.3 10*3/uL (ref 1.7–7.7)
Neutrophils Relative %: 54 % (ref 43–77)
Neutrophils Relative %: 49 % (ref 43–77)
Neutrophils Relative %: 54 % (ref 43–77)
Neutrophils Relative %: 55 % (ref 43–77)
Neutrophils Relative %: 65 % (ref 43–77)
Neutrophils Relative %: 66 % (ref 43–77)

## 2010-10-10 LAB — BLOOD GAS, ARTERIAL
Acid-Base Excess: 1 mmol/L (ref 0.0–2.0)
Bicarbonate: 25.1 mEq/L — ABNORMAL HIGH (ref 20.0–24.0)
FIO2: 0.21 %
O2 Saturation: 93.9 %
Patient temperature: 98.6
TCO2: 26.3 mmol/L (ref 0–100)
pCO2 arterial: 40.1 mmHg (ref 35.0–45.0)
pH, Arterial: 7.413 — ABNORMAL HIGH (ref 7.350–7.400)
pO2, Arterial: 65.7 mmHg — ABNORMAL LOW (ref 80.0–100.0)

## 2010-10-10 LAB — POCT CARDIAC MARKERS
CKMB, poc: 1 ng/mL (ref 1.0–8.0)
CKMB, poc: 1.1 ng/mL (ref 1.0–8.0)
CKMB, poc: 1.4 ng/mL (ref 1.0–8.0)
CKMB, poc: 1.5 ng/mL (ref 1.0–8.0)
CKMB, poc: 1.5 ng/mL (ref 1.0–8.0)
CKMB, poc: <1 ng/mL — ABNORMAL LOW (ref 1.0–8.0)
Myoglobin, poc: 106 ng/mL (ref 12–200)
Myoglobin, poc: 110 ng/mL (ref 12–200)
Myoglobin, poc: 123 ng/mL (ref 12–200)
Myoglobin, poc: 76.8 ng/mL (ref 12–200)
Myoglobin, poc: 82.5 ng/mL (ref 12–200)
Myoglobin, poc: 91.7 ng/mL (ref 12–200)
Troponin i, poc: <0.05 ng/mL (ref 0.00–0.09)
Troponin i, poc: <0.05 ng/mL (ref 0.00–0.09)
Troponin i, poc: <0.05 ng/mL (ref 0.00–0.09)
Troponin i, poc: <0.05 ng/mL (ref 0.00–0.09)
Troponin i, poc: <0.05 ng/mL (ref 0.00–0.09)
Troponin i, poc: <0.05 ng/mL (ref 0.00–0.09)

## 2010-10-10 LAB — PROTIME-INR
INR: 0.9 (ref 0.00–1.49)
INR: 1 (ref 0.00–1.49)
Prothrombin Time: 12.2 seconds (ref 11.6–15.2)
Prothrombin Time: 12.6 seconds (ref 11.6–15.2)

## 2010-10-10 LAB — CARDIAC PANEL(CRET KIN+CKTOT+MB+TROPI)
CK, MB: 1 ng/mL (ref 0.3–4.0)
CK, MB: 1.4 ng/mL (ref 0.3–4.0)
Relative Index: 0.5 (ref 0.0–2.5)
Relative Index: 0.7 (ref 0.0–2.5)
Total CK: 195 U/L — ABNORMAL HIGH (ref 7–177)
Total CK: 203 U/L — ABNORMAL HIGH (ref 7–177)
Troponin I: 0.01 ng/mL (ref 0.00–0.06)
Troponin I: 0.01 ng/mL (ref 0.00–0.06)

## 2010-10-10 LAB — URINALYSIS, ROUTINE W REFLEX MICROSCOPIC
Bilirubin Urine: NEGATIVE
Glucose, UA: NEGATIVE mg/dL
Hgb urine dipstick: NEGATIVE
Ketones, ur: NEGATIVE mg/dL
Nitrite: NEGATIVE
Protein, ur: NEGATIVE mg/dL
Specific Gravity, Urine: 1.004 — ABNORMAL LOW (ref 1.005–1.030)
Urobilinogen, UA: 0.2 mg/dL (ref 0.0–1.0)
pH: 7.5 (ref 5.0–8.0)

## 2010-10-10 LAB — POCT I-STAT, CHEM 8
BUN: 9 mg/dL (ref 6–23)
Calcium, Ion: 1.08 mmol/L — ABNORMAL LOW (ref 1.12–1.32)
Chloride: 103 mEq/L (ref 96–112)
Creatinine, Ser: 0.9 mg/dL (ref 0.4–1.2)
Glucose, Bld: 100 mg/dL — ABNORMAL HIGH (ref 70–99)
HCT: 42 % (ref 36.0–46.0)
Hemoglobin: 14.3 g/dL (ref 12.0–15.0)
Potassium: 3.8 mEq/L (ref 3.5–5.1)
Sodium: 138 mEq/L (ref 135–145)
TCO2: 28 mmol/L (ref 0–100)

## 2010-10-10 LAB — POCT I-STAT 3, ART BLOOD GAS (G3+)
Acid-Base Excess: 2 mmol/L (ref 0.0–2.0)
Bicarbonate: 27 mEq/L — ABNORMAL HIGH (ref 20.0–24.0)
O2 Saturation: 83 %
Patient temperature: 98
TCO2: 28 mmol/L (ref 0–100)
pCO2 arterial: 41 mmHg (ref 35.0–45.0)
pH, Arterial: 7.425 — ABNORMAL HIGH (ref 7.350–7.400)
pO2, Arterial: 46 mmHg — ABNORMAL LOW (ref 80.0–100.0)

## 2010-10-10 LAB — CK TOTAL AND CKMB (NOT AT ARMC)
CK, MB: 2.4 ng/mL (ref 0.3–4.0)
Relative Index: 0.9 (ref 0.0–2.5)
Total CK: 257 U/L — ABNORMAL HIGH (ref 7–177)

## 2010-10-10 LAB — APTT
aPTT: 28 seconds (ref 24–37)
aPTT: 31 seconds (ref 24–37)

## 2010-10-10 LAB — TSH: TSH: 0.655 u[IU]/mL (ref 0.350–4.500)

## 2010-10-10 LAB — BRAIN NATRIURETIC PEPTIDE: Pro B Natriuretic peptide (BNP): 50 pg/mL (ref 0.0–100.0)

## 2010-10-10 LAB — TROPONIN I: Troponin I: 0.02 ng/mL (ref 0.00–0.06)

## 2010-10-10 LAB — D-DIMER, QUANTITATIVE (NOT AT ARMC)
D-Dimer, Quant: 0.86 ug/mL-FEU — ABNORMAL HIGH (ref 0.00–0.48)
D-Dimer, Quant: 1.22 ug/mL-FEU — ABNORMAL HIGH (ref 0.00–0.48)
D-Dimer, Quant: 1.05 ug/mL-FEU — ABNORMAL HIGH (ref 0.00–0.48)

## 2010-10-10 LAB — POCT PREGNANCY, URINE: Preg Test, Ur: NEGATIVE

## 2010-10-12 LAB — URINALYSIS, ROUTINE W REFLEX MICROSCOPIC
Bilirubin Urine: NEGATIVE
Bilirubin Urine: NEGATIVE
Glucose, UA: NEGATIVE mg/dL
Glucose, UA: NEGATIVE mg/dL
Hgb urine dipstick: NEGATIVE
Ketones, ur: NEGATIVE mg/dL
Ketones, ur: NEGATIVE mg/dL
Leukocytes, UA: NEGATIVE
Nitrite: NEGATIVE
Nitrite: NEGATIVE
Protein, ur: NEGATIVE mg/dL
Protein, ur: NEGATIVE mg/dL
Specific Gravity, Urine: 1.028 (ref 1.005–1.030)
Specific Gravity, Urine: 1.039 — ABNORMAL HIGH (ref 1.005–1.030)
Urobilinogen, UA: 0.2 mg/dL (ref 0.0–1.0)
Urobilinogen, UA: 0.2 mg/dL (ref 0.0–1.0)
pH: 6 (ref 5.0–8.0)
pH: 5.5 (ref 5.0–8.0)

## 2010-10-12 LAB — COMPREHENSIVE METABOLIC PANEL
ALT: 27 U/L (ref 0–35)
AST: 23 U/L (ref 0–37)
Albumin: 4 g/dL (ref 3.5–5.2)
Alkaline Phosphatase: 76 U/L (ref 39–117)
BUN: 10 mg/dL (ref 6–23)
CO2: 24 mEq/L (ref 19–32)
Calcium: 9.4 mg/dL (ref 8.4–10.5)
Chloride: 112 mEq/L (ref 96–112)
Creatinine, Ser: 0.98 mg/dL (ref 0.4–1.2)
GFR calc Af Amer: >60 mL/min (ref >60)
GFR calc non Af Amer: >60 mL/min (ref >60)
Glucose, Bld: 105 mg/dL — ABNORMAL HIGH (ref 70–99)
Potassium: 3.3 mEq/L — ABNORMAL LOW (ref 3.5–5.1)
Sodium: 144 mEq/L (ref 135–145)
Total Bilirubin: 0.4 mg/dL (ref 0.3–1.2)
Total Protein: 7.7 g/dL (ref 6.0–8.3)

## 2010-10-12 LAB — DIFFERENTIAL
Basophils Absolute: 0.1 10*3/uL (ref 0.0–0.1)
Basophils Absolute: 0 10*3/uL (ref 0.0–0.1)
Basophils Relative: 1 % (ref 0–1)
Basophils Relative: 0 % (ref 0–1)
Eosinophils Absolute: 0.6 10*3/uL (ref 0.0–0.7)
Eosinophils Absolute: 0.2 10*3/uL (ref 0.0–0.7)
Eosinophils Relative: 6 % — ABNORMAL HIGH (ref 0–5)
Eosinophils Relative: 2 % (ref 0–5)
Lymphocytes Relative: 33 % (ref 12–46)
Lymphocytes Relative: 22 % (ref 12–46)
Lymphs Abs: 3.2 10*3/uL (ref 0.7–4.0)
Lymphs Abs: 1.9 10*3/uL (ref 0.7–4.0)
Monocytes Absolute: 0.7 10*3/uL (ref 0.1–1.0)
Monocytes Absolute: 0.8 10*3/uL (ref 0.1–1.0)
Monocytes Relative: 7 % (ref 3–12)
Monocytes Relative: 9 % (ref 3–12)
Neutro Abs: 5.2 10*3/uL (ref 1.7–7.7)
Neutro Abs: 5.6 10*3/uL (ref 1.7–7.7)
Neutrophils Relative %: 53 % (ref 43–77)
Neutrophils Relative %: 66 % (ref 43–77)

## 2010-10-12 LAB — POCT CARDIAC MARKERS
CKMB, poc: <1 ng/mL — ABNORMAL LOW (ref 1.0–8.0)
Myoglobin, poc: 89.9 ng/mL (ref 12–200)
Troponin i, poc: <0.05 ng/mL (ref 0.00–0.09)

## 2010-10-12 LAB — CBC
HCT: 41.2 % (ref 36.0–46.0)
HCT: 43.2 % (ref 36.0–46.0)
Hemoglobin: 13.5 g/dL (ref 12.0–15.0)
Hemoglobin: 14.3 g/dL (ref 12.0–15.0)
MCHC: 32.7 g/dL (ref 30.0–36.0)
MCHC: 33.2 g/dL (ref 30.0–36.0)
MCV: 86 fL (ref 78.0–100.0)
MCV: 85 fL (ref 78.0–100.0)
Platelets: 325 10*3/uL (ref 150–400)
Platelets: 302 10*3/uL (ref 150–400)
RBC: 4.79 MIL/uL (ref 3.87–5.11)
RBC: 5.07 MIL/uL (ref 3.87–5.11)
RDW: 16.1 % — ABNORMAL HIGH (ref 11.5–15.5)
RDW: 16.5 % — ABNORMAL HIGH (ref 11.5–15.5)
WBC: 9.8 10*3/uL (ref 4.0–10.5)
WBC: 8.6 10*3/uL (ref 4.0–10.5)

## 2010-10-12 LAB — D-DIMER, QUANTITATIVE (NOT AT ARMC): D-Dimer, Quant: 1.31 ug/mL-FEU — ABNORMAL HIGH (ref 0.00–0.48)

## 2010-10-12 LAB — LIPASE, BLOOD: Lipase: 38 U/L (ref 11–59)

## 2010-10-12 LAB — URINE MICROSCOPIC-ADD ON

## 2010-10-12 LAB — POCT I-STAT, CHEM 8
BUN: 12 mg/dL (ref 6–23)
Calcium, Ion: 0.85 mmol/L — ABNORMAL LOW (ref 1.12–1.32)
Chloride: 103 mEq/L (ref 96–112)
Creatinine, Ser: 0.8 mg/dL (ref 0.4–1.2)
Glucose, Bld: 116 mg/dL — ABNORMAL HIGH (ref 70–99)
HCT: 43 % (ref 36.0–46.0)
Hemoglobin: 14.6 g/dL (ref 12.0–15.0)
Potassium: 3.6 mEq/L (ref 3.5–5.1)
Sodium: 137 mEq/L (ref 135–145)
TCO2: 27 mmol/L (ref 0–100)

## 2010-10-12 LAB — BRAIN NATRIURETIC PEPTIDE: Pro B Natriuretic peptide (BNP): <30 pg/mL (ref 0.0–100.0)

## 2010-10-13 ENCOUNTER — Encounter: Payer: Self-pay | Admitting: Adult Health

## 2010-10-13 LAB — POCT I-STAT, CHEM 8
BUN: 10 mg/dL (ref 6–23)
Calcium, Ion: 1.07 mmol/L — ABNORMAL LOW (ref 1.12–1.32)
Chloride: 102 mEq/L (ref 96–112)
Creatinine, Ser: 0.8 mg/dL (ref 0.4–1.2)
Glucose, Bld: 94 mg/dL (ref 70–99)
HCT: 38 % (ref 36.0–46.0)
Hemoglobin: 12.9 g/dL (ref 12.0–15.0)
Potassium: 3.3 mEq/L — ABNORMAL LOW (ref 3.5–5.1)
Sodium: 142 mEq/L (ref 135–145)
TCO2: 31 mmol/L (ref 0–100)

## 2010-10-15 ENCOUNTER — Ambulatory Visit (INDEPENDENT_AMBULATORY_CARE_PROVIDER_SITE_OTHER): Payer: Medicare Other | Admitting: Adult Health

## 2010-10-15 ENCOUNTER — Encounter: Payer: Self-pay | Admitting: Adult Health

## 2010-10-15 DIAGNOSIS — J45909 Unspecified asthma, uncomplicated: Secondary | ICD-10-CM

## 2010-10-15 DIAGNOSIS — R7989 Other specified abnormal findings of blood chemistry: Secondary | ICD-10-CM

## 2010-10-15 HISTORY — DX: Other specified abnormal findings of blood chemistry: R79.89

## 2010-10-15 MED ORDER — ALBUTEROL SULFATE (2.5 MG/3ML) 0.083% IN NEBU
2.5000 mg | INHALATION_SOLUTION | Freq: Once | RESPIRATORY_TRACT | Status: AC
  Administered 2010-10-15: 2.5 mg via RESPIRATORY_TRACT

## 2010-10-15 NOTE — Progress Notes (Signed)
Addended by: Carron Curie on: 10/15/2010 12:40 PM   Modules accepted: Orders

## 2010-10-15 NOTE — Assessment & Plan Note (Signed)
Needs follow up with PCP to discuss this  Elevated labs in ER.

## 2010-10-15 NOTE — Assessment & Plan Note (Addendum)
Exacerbation with underlying rhinitis.  Albuterol neb in office  Plan:  Hold steroids for now.  Increase Advair to 500/50 Twice daily   Add Patanse and zyrtec.  Set up with Dr. Maple Hudson for allergy follow up  Please contact office for sooner follow up if symptoms do not improve or worsen or seek emergency care  follow up Dr. Delford Field  In 4 weeks. And As needed

## 2010-10-15 NOTE — Patient Instructions (Addendum)
Increase Advair 500/50  1 puff Twice daily  -sample given Use Patanase 2 puffs Twice daily  -until sample is gone.  Begin Zyrtec 10mg  At bedtime   Saline nasal rinses As needed   follow up Dr. Maple Hudson  In 2 weeks  follow up Dr. Delford Field  In 4 weeks  Need to make appointment with your family doctor to discuss your elevated liver test.  Please contact office for sooner follow up if symptoms do not improve or worsen or seek emergency care

## 2010-10-15 NOTE — Progress Notes (Signed)
Subjective:    Patient ID: [name], female    DOB: 10-19-1960, 50 y.o.   MRN: [id number]  HPI 50 yo AAF with known hx of Asthma and Allergic Rhinitis  318/10- [name] seen in allergy consult at kind request of Dr Delford Field, asking evaluation to help management of asthma.  Required intubation 11/09. gives hx of seasonal allergic rhinits and was skin test positive in 1996 to grass and dust, but never on allergy v accine. States she has had "allergy and asthma since birth". Current difficult interval began 5 months ago. Triggers include cold and hot weather, strong odors and cheimical smells. S  10/10/08- Allergy evaluation- asthma  Tree pollen heavy now and distinctly bothering eyes and nose itch and drain. Feels wheezier.  Here for allergy testing- msses antihistamines .  12/03/09 nAllergic asthma  Skin tests- Strongly POS grass, weed, tree, dust last year.  Asks about restarting allergy shots.   Started home O2 for sleep at 2 l/M and says this helps her sleep.   September 27, 2009 ccAcute visit. Pt was seen at [facility] ER last night with increased SOB and left side chest and abdominal pain. She was told that she may have possible blood clot b/c of elevated d-dimer. She states that her CP only occurs upon inspration. Gets SOB walking from room to room = baseline. new onset epigastric and LLQ pain x 1 week better lying down and worse sitting. cp worse with deep breath, abd pain worse with coughing. no change in bowels better with xanax. Never present supine. No n or v.   October 11, 2009--Presents for a follow up and med reivew. est med calendar - pt did not bring her meds with her today. pt c/o pain on left side at groin area, SOB and "some wheezing" x1week. Last visit had atypical chest/abd pain suspected to be IBS. Cardiac enzymes neg. Neg w/up for PE and ESR and BNP normal. She has had an extensive work up for dyspnea/DOE/ and atypical chest pain. over last 6 months she has had numerous chest xray-NAD ,  CT chest-neg for PE and VQ scan x 2 w/ low probalbility for PE. 2 D ECHO 10/10 showed nml LVF w/ EF at 55-60% no PFO noted. Sleep test showed only mild OSA -no CPAP indicated. Last visit she did desat with walking but with quick recovery. Unfortuanetly did not bring in meds today. We reviewed her list and she is on multiple narcotics, pschy meds.   October 29, 2009 10:24 AM  The pt continues with dyspnea and uses ventolin every 4 hours. She feels the dulera helped. She is on the [place]. She has been back and forth to ED for wheezing and chest pain. She had neg w/u for PE and MI. See data from 4/8 NP OV. She has not followed through on Xolair and allergy immunotherapy due to inability to afford injection copay but now has medicaid and is willing to try again with both therapies.  No cpap needed on sleep study.   Nov 05, 2009 --Presents for acute office visit. Complains of increased SOB, wheezing and dizziness onset last night - denies cough, f/c/s - currently taking 2 tabs daily 10mg  pred taper. Prednisone was helping until went down on lower dose, last night wheezing returned w/ increase albuterol use. last month at er - xray neg, VQ low prob. , CT 9.10,neg for pulmonary emboli. Denies chest pain, orthopnea, hemoptysis, fever, n/v/d, edema, headache. Not taking dexilant, cant afford.   January 22, 2010 12:01 PM  Last seen by PW 4/11. Saw NP in May twice . Not able to get allergy shots due to transportation  Lately doing ok, Proair now instead of ventolin. The pt started wheezing again and now using proair: using proair twice a day now. off pred for 3 weeks. The pt became worse off pred. The pt cannot afford xolair. The pt cannot come regularly for allergy immunotherapy.   10/15/2010 Acute OV  Pt presents for worsening asthma control over last couple of weeks. Some wheezing and dry cough. NO discolored mucus or fever. Pt fell 3 weeks out of bathtub onto back. Was seen In [facility] ER. CT chest /cxr with no acute  process. NO fractures. Rib/back pain are improivng. She has not had any hemoptysis. Last seen 7.2011 says her asthma has been okay until last few weeks. Seems worse with increased pollwn outside. She is off her allergy shots. For last several months had hard time getting her for injections.  She says she is compliant with her Advair Twice daily. We discussed steroid use she says she is very intolerant with agitation and headache. Does have some nasal drip and drainage. On average uses proair once monthly, increased use for last week.   Labs in Er showed elevated LFTs and alk phos- she denies etoh or tylenol use. Is not on statin. We discussed she will need follow up with PCP to evaluate this.   Review of Systems Constitutional:   No  weight loss, night sweats,  Fevers,    HEENT:   No headaches,  Difficulty swallowing,  Tooth/dental problems, or  Sore throat,                    CV:  No chest pain,  Orthopnea, PND, swelling in lower extremities, anasarca, dizziness, palpitations, syncope.   GI  No heartburn, indigestion, abdominal pain, nausea, vomiting, diarrhea, change in bowel habits, loss of appetite, bloody stools.   Resp:   No coughing up of blood.  No change in color of mucus.  No wheezing.  No chest wall deformity  Skin: no rash or lesions.  GU: no dysuria, change in color of urine, no urgency or frequency.  No flank pain, no hematuria   MS:  No joint pain or swelling.  No decreased range of motion.     Psych:  No change in mood or affect. No depression or anxiety.  No memory loss.         Objective:   Physical Exam GEN: A/Ox3; pleasant , NAD, well nourished   HEENT:  [place]/AT,  EACs-clear, TMs-wnl, NOSE-clear drainage.  THROAT-clear, no lesions, no postnasal drip or exudate noted.   NECK:  Supple w/ fair ROM; no JVD; normal carotid impulses w/o bruits; no thyromegaly or nodules palpated; no lymphadenopathy.  RESP  Coarse BS w/ no wheezing. Chest wall nontender with no  ecchymosis noted.   CARD:  RRR, no m/r/g  , no peripheral edema, pulses intact, no cyanosis or clubbing.  GI:   Soft & nt; nml bowel sounds; no organomegaly or masses detected.  Musco: Warm bil, no deformities or joint swelling noted.   Neuro: alert, no focal deficits noted.    Skin: Warm, no lesions or rashes          Assessment & Plan:

## 2010-10-15 NOTE — Progress Notes (Signed)
noted 

## 2010-10-20 ENCOUNTER — Other Ambulatory Visit: Payer: Self-pay | Admitting: Critical Care Medicine

## 2010-10-27 ENCOUNTER — Encounter: Payer: Self-pay | Admitting: Internal Medicine

## 2010-10-29 ENCOUNTER — Ambulatory Visit: Payer: Medicare Other | Admitting: Internal Medicine

## 2010-11-12 ENCOUNTER — Ambulatory Visit (INDEPENDENT_AMBULATORY_CARE_PROVIDER_SITE_OTHER): Payer: Medicare Other | Admitting: Critical Care Medicine

## 2010-11-12 ENCOUNTER — Encounter: Payer: Self-pay | Admitting: Critical Care Medicine

## 2010-11-12 DIAGNOSIS — J45909 Unspecified asthma, uncomplicated: Secondary | ICD-10-CM

## 2010-11-12 NOTE — Patient Instructions (Signed)
No change in medications. Return in       3 months Keep allergy visit with Dr young

## 2010-11-12 NOTE — Assessment & Plan Note (Addendum)
Stable moderate persistent asthma with atopic features Plan No change in inhaled or maintenance medications. Return in  3 months Keep allergy eval

## 2010-11-12 NOTE — Progress Notes (Signed)
Subjective:    Patient ID: [name], female    DOB: 06-17-1961, 50 y.o.   MRN: [id number]  HPI  50 yo AAF with known hx of Asthma and Allergic Rhinitis  318/10- [name] seen in allergy consult at kind request of Dr Delford Field, asking evaluation to help management of asthma.  Required intubation 11/09. gives hx of seasonal allergic rhinits and was skin test positive in 1996 to grass and dust, but never on allergy v accine. States she has had "allergy and asthma since birth". Current difficult interval began 5 months ago. Triggers include cold and hot weather, strong odors and cheimical smells. S  10/10/08- Allergy evaluation- asthma  Tree pollen heavy now and distinctly bothering eyes and nose itch and drain. Feels wheezier.  Here for allergy testing- msses antihistamines .  12/03/09 nAllergic asthma  Skin tests- Strongly POS grass, weed, tree, dust last year.  Asks about restarting allergy shots.   Started home O2 for sleep at 2 l/M and says this helps her sleep.   September 27, 2009 ccAcute visit. Pt was seen at [facility] ER last night with increased SOB and left side chest and abdominal pain. She was told that she may have possible blood clot b/c of elevated d-dimer. She states that her CP only occurs upon inspration. Gets SOB walking from room to room = baseline. new onset epigastric and LLQ pain x 1 week better lying down and worse sitting. cp worse with deep breath, abd pain worse with coughing. no change in bowels better with xanax. Never present supine. No n or v.   October 11, 2009--Presents for a follow up and med reivew. est med calendar - pt did not bring her meds with her today. pt c/o pain on left side at groin area, SOB and "some wheezing" x1week. Last visit had atypical chest/abd pain suspected to be IBS. Cardiac enzymes neg. Neg w/up for PE and ESR and BNP normal. She has had an extensive work up for dyspnea/DOE/ and atypical chest pain. over last 6 months she has had numerous chest xray-NAD ,  CT chest-neg for PE and VQ scan x 2 w/ low probalbility for PE. 2 D ECHO 10/10 showed nml LVF w/ EF at 55-60% no PFO noted. Sleep test showed only mild OSA -no CPAP indicated. Last visit she did desat with walking but with quick recovery. Unfortuanetly did not bring in meds today. We reviewed her list and she is on multiple narcotics, pschy meds.   October 29, 2009 10:24 AM  The pt continues with dyspnea and uses ventolin every 4 hours. She feels the dulera helped. She is on the [place]. She has been back and forth to ED for wheezing and chest pain. She had neg w/u for PE and MI. See data from 4/8 NP OV. She has not followed through on Xolair and allergy immunotherapy due to inability to afford injection copay but now has medicaid and is willing to try again with both therapies.  No cpap needed on sleep study.   Nov 05, 2009 --Presents for acute office visit. Complains of increased SOB, wheezing and dizziness onset last night - denies cough, f/c/s - currently taking 2 tabs daily 10mg  pred taper. Prednisone was helping until went down on lower dose, last night wheezing returned w/ increase albuterol use. last month at er - xray neg, VQ low prob. , CT 9.10,neg for pulmonary emboli. Denies chest pain, orthopnea, hemoptysis, fever, n/v/d, edema, headache. Not taking dexilant, cant afford.  January 22, 2010 12:01 PM  Last seen by PW 4/11. Saw NP in May twice . Not able to get allergy shots due to transportation  Lately doing ok, Proair now instead of ventolin. The pt started wheezing again and now using proair: using proair twice a day now. off pred for 3 weeks. The pt became worse off pred. The pt cannot afford xolair. The pt cannot come regularly for allergy immunotherapy.   10/17/10 Acute ov NP  Pt presents for worsening asthma control over last couple of weeks. Some wheezing and dry cough. NO discolored mucus or fever. Pt fell 3 weeks out of bathtub onto back. Was seen In [facility] ER. CT chest /cxr with no acute  process. NO fractures. Rib/back pain are improivng. She has not had any hemoptysis. Last seen 7.2011 says her asthma has been okay until last few weeks. Seems worse with increased pollwn outside. She is off her allergy shots. For last several months had hard time getting her for injections.  She says she is compliant with her Advair Twice daily. We discussed steroid use she says she is very intolerant with agitation and headache. Does have some nasal drip and drainage. On average uses proair once monthly, increased use for last week.   Labs in Er showed elevated LFTs and alk phos- she denies etoh or tylenol use. Is not on statin. We discussed she will need follow up with PCP to evaluate this.   11/12/2010 Pt seen by NP 4/13 At this ov she rec: Hold steroids for now.  Increase Advair to 500/50 Twice daily  Add Patanse and zyrtec.  Set up with Dr. Maple Hudson for allergy follow up  Since that ov doing better.  Not much cough now.  Still with wheezing.   Past Medical History  Diagnosis Date  . Anemia   . Anxiety state, unspecified   . Backache, unspecified   . Depressive disorder, not elsewhere classified   . Obstructive sleep apnea (adult) (pediatric)   . Esophageal reflux   . Other abnormal glucose   . Fibromyalgia   . Arthropathy, unspecified, site unspecified      Family History  Problem Relation Age of Onset  . Ovarian cancer Mother   . Heart disease Father      History   Social History  . Marital Status: Divorced    Spouse Name: N/A    Number of Children: 2  . Years of Education: N/A   Occupational History  . Disabled    Social History Main Topics  . Smoking status: Former Smoker -- 1.0 packs/day for 5 years    Types: Cigarettes    Quit date: 07/07/2007  . Smokeless tobacco: Never Used  . Alcohol Use: No  . Drug Use: Not on file  . Sexually Active: Not on file   Other Topics Concern  . Not on file   Social History Narrative  . No narrative on file     Allergies    Allergen Reactions  . Amitriptyline Hcl   . Butorphanol Tartrate     REACTION: Hallucinations  . Iodine     REACTION: Flushing and fainting  . Latex     REACTION: rash  . Penicillins     REACTION: dyspnea  . Shellfish-Derived Products     Throat swelling     Outpatient Prescriptions Prior to Visit  Medication Sig Dispense Refill  . ALPRAZolam (XANAX) 0.5 MG tablet Take 0.5 mg by mouth 3 (three) times daily as needed.        Marland Kitchen  asenapine (SAPHRIS) 5 MG SUBL Place 1 mg under the tongue at bedtime.        Marland Kitchen buPROPion (WELLBUTRIN XL) 300 MG 24 hr tablet Take 300 mg by mouth daily.        . cyclobenzaprine (FLEXERIL) 10 MG tablet Take 10 mg by mouth 3 (three) times daily as needed.        . DULoxetine (CYMBALTA) 60 MG capsule Take 60 mg by mouth daily.        Marland Kitchen EPINEPHrine (EPI-PEN) 0.3 mg/0.3 mL DEVI Inject 0.3 mg into the muscle once.        . famotidine (PEPCID) 20 MG tablet Take 20 mg by mouth 2 (two) times daily.        Marland Kitchen gabapentin (NEURONTIN) 600 MG tablet Take 600 mg by mouth 3 (three) times daily.        Marland Kitchen lamoTRIgine (LAMICTAL) 100 MG tablet Take 2 at bedtime        . metoprolol succinate (TOPROL-XL) 25 MG 24 hr tablet TAKE 1 TABLET BY MOUTH DAILY  30 tablet  1  . morphine (MS CONTIN) 30 MG 12 hr tablet Take 30 mg by mouth every 8 (eight) hours as needed.        . nabumetone (RELAFEN) 500 MG tablet Take 500 mg by mouth 2 (two) times daily.        Marland Kitchen PROAIR HFA 108 (90 BASE) MCG/ACT inhaler INHALE 2 PUFFS EVERY 4 HOURS AS NEEDED  1 Inhaler  1  . zaleplon (SONATA) 10 MG capsule Take 10 mg by mouth at bedtime as needed.       . zolpidem (AMBIEN) 10 MG tablet Take 5 mg by mouth at bedtime as needed.       . metoprolol tartrate (LOPRESSOR) 25 MG tablet Take 25 mg by mouth daily.        . Fluticasone-Salmeterol (ADVAIR DISKUS) 250-50 MCG/DOSE AEPB Inhale 1 puff into the lungs every 12 (twelve) hours.        Marland Kitchen tiotropium (SPIRIVA) 18 MCG inhalation capsule Place 18 mcg into inhaler  and inhale daily.           Review of Systems  Constitutional:   No  weight loss, night sweats,  Fevers,    HEENT:   No headaches,  Difficulty swallowing,  Tooth/dental problems, or  Sore throat,                    CV:  No chest pain,  Orthopnea, PND, swelling in lower extremities, anasarca, dizziness, palpitations, syncope.   GI  No heartburn, indigestion, abdominal pain, nausea, vomiting, diarrhea, change in bowel habits, loss of appetite, bloody stools.   Resp:  Dyspnea the same  No coughing up of blood.  No change in color of mucus.  Notes  wheezing.  No chest wall deformity  Skin: no rash or lesions.  GU: no dysuria, change in color of urine, no urgency or frequency.  No flank pain, no hematuria   MS:  No joint pain or swelling.  No decreased range of motion.     Psych:  No change in mood or affect. No depression or anxiety.  No memory loss.         Objective:   Physical Exam  GEN: A/Ox3; pleasant , NAD, well nourished   HEENT:  [place]/AT,  EACs-clear, TMs-wnl, NOSE-clear drainage.  THROAT-clear, no lesions, no postnasal drip or exudate noted.   NECK:  Supple w/ fair ROM; no JVD;  normal carotid impulses w/o bruits; no thyromegaly or nodules palpated; no lymphadenopathy.  RESP  Coarse BS w/ no wheezing. Chest wall nontender with no ecchymosis noted.   CARD:  RRR, no m/r/g  , no peripheral edema, pulses intact, no cyanosis or clubbing.  GI:   Soft & nt; nml bowel sounds; no organomegaly or masses detected.  Musco: Warm bil, no deformities or joint swelling noted.   Neuro: alert, no focal deficits noted.    Skin: Warm, no lesions or rashes          Assessment & Plan:   EXTRINSIC ASTHMA, UNSPECIFIED Stable moderate persistent asthma with atopic features Plan No change in inhaled or maintenance medications. Return in  3 months Keep allergy eval    Updated Medication List Outpatient Encounter Prescriptions as of 11/12/2010  Medication Sig Dispense Refill   . ALPRAZolam (XANAX) 0.5 MG tablet Take 0.5 mg by mouth 3 (three) times daily as needed.        Marland Kitchen asenapine (SAPHRIS) 5 MG SUBL Place 1 mg under the tongue at bedtime.        Marland Kitchen buPROPion (WELLBUTRIN XL) 300 MG 24 hr tablet Take 300 mg by mouth daily.        . cyclobenzaprine (FLEXERIL) 10 MG tablet Take 10 mg by mouth 3 (three) times daily as needed.        . DULoxetine (CYMBALTA) 60 MG capsule Take 60 mg by mouth daily.        Marland Kitchen EPINEPHrine (EPI-PEN) 0.3 mg/0.3 mL DEVI Inject 0.3 mg into the muscle once.        . famotidine (PEPCID) 20 MG tablet Take 20 mg by mouth 2 (two) times daily.        . Fluticasone-Salmeterol (ADVAIR DISKUS) 500-50 MCG/DOSE AEPB Inhale 1 puff into the lungs every 12 (twelve) hours.        . gabapentin (NEURONTIN) 600 MG tablet Take 600 mg by mouth 3 (three) times daily.        Marland Kitchen lamoTRIgine (LAMICTAL) 100 MG tablet Take 2 at bedtime        . metoprolol succinate (TOPROL-XL) 25 MG 24 hr tablet TAKE 1 TABLET BY MOUTH DAILY  30 tablet  1  . morphine (MS CONTIN) 30 MG 12 hr tablet Take 30 mg by mouth every 8 (eight) hours as needed.        . nabumetone (RELAFEN) 500 MG tablet Take 500 mg by mouth 2 (two) times daily.        Marland Kitchen PROAIR HFA 108 (90 BASE) MCG/ACT inhaler INHALE 2 PUFFS EVERY 4 HOURS AS NEEDED  1 Inhaler  1  . zaleplon (SONATA) 10 MG capsule Take 10 mg by mouth at bedtime as needed.       . zolpidem (AMBIEN) 10 MG tablet Take 5 mg by mouth at bedtime as needed.       Marland Kitchen DISCONTD: metoprolol tartrate (LOPRESSOR) 25 MG tablet Take 25 mg by mouth daily.        Marland Kitchen DISCONTD: Fluticasone-Salmeterol (ADVAIR DISKUS) 250-50 MCG/DOSE AEPB Inhale 1 puff into the lungs every 12 (twelve) hours.        Marland Kitchen DISCONTD: tiotropium (SPIRIVA) 18 MCG inhalation capsule Place 18 mcg into inhaler and inhale daily.

## 2010-11-18 NOTE — H&P (Signed)
[name], [name]               ACCOUNT NO.:  [id number]   MEDICAL RECORD NO.:  [id number]          PATIENT TYPE:  INP   LOCATION:  1824                         FACILITY:  [facility]   PHYSICIAN:  Hettie Holstein, D.O.    DATE OF BIRTH:  1960-08-07   DATE OF ADMISSION:  12/05/2006  DATE OF DISCHARGE:                              HISTORY & PHYSICAL   PRIMARY CARE PHYSICIAN:  Lorelle Formosa, M.D.   CHIEF COMPLAINT:  Wheezing and shortness of breath.   HISTORY OF PRESENT ILLNESS:  [name] is a 49 year old female  with history of asthma since childhood who had been dealing with  additional medical issues currently presenting to the emergency  department for abdominal pain in addition to back pain.  She has  recently undergone L4-5 facet injection on May 29, with limited benefit.  In addition, her abdominal pain as being considered for further  investigations by Dr. Stefano Gaul, perhaps an exploratory surgery.  In any  event, she presented with wheezing without response to metered-dose  inhaler and Combivent.  She was in the emergency department and  evaluated by Dr. Weldon Inches who felt as though she should be admitted  with status asthmaticus as she did not respond to nebulizers and  steroids.  In any event, upon my evaluation, she was awake, alert, able  to speak full sentences though she was bronchospastic and having a cough  with productive yellow sputum.   PAST MEDICAL HISTORY:  1. History of asthma since childhood.  In the 1980s, she did have some      hospitalizations and actually intubation due to this, but has not      since that time.  2. History of arthritis as described above with chronic abdominal      pain.   PAST SURGICAL HISTORY:  Status post C-section and appendectomy,  gallbladder surgery and hernia surgery.   MEDICATIONS AT HOME:  Combivent metered-dose inhaler p.r.n., albuterol  metered-dose inhaler p.r.n. and recently Flexeril and Vicodin.   ALLERGIES:   PENICILLIN, SHELLFISH AND STADOL.   SOCIAL HISTORY:  She does not smoke.  She is divorced and has two  children.  She works as a Environmental consultant at [employer].   FAMILY HISTORY:  Significant for mother who died at age 87 with ovarian  cancer and a father who died in his 72s of unknown cause.   REVIEW OF SYSTEMS:  As noted above, she has been having issues with  abdominal pain as well as back pain.  Otherwise, no nausea, vomiting,  diarrhea, no vaginal discharge.  No blood in her stools that she can  recall.  Further review of systems unremarkable.   PHYSICAL EXAMINATION:  GENERAL:  In the emergency department, she was  noted to be slightly tachycardiac following nebulizer treatments.  VITAL SIGNS:  She was afebrile, blood pressure is 126/69, respirations  20.  HEENT:  Head normocephalic, atraumatic.  Extraocular muscles intact.  NECK:  Supple, nontender, no palpable thyromegaly or mass.  CARDIAC:  Normal S1, S2 without appreciable murmur.  LUNGS:  Diffuse wheezing bilaterally.  She exhibited  normal effort,  however, and there was no dullness to percussion.  She was able to speak  full sentences.  ABDOMEN:  Soft and nontender.  No rebound or guarding.  EXTREMITIES:  No edema.  No cyanosis or clubbing.  Peripheral pulses  were palpable.  NEUROLOGIC:  Euthymic.  Affect was stable.  No focal neurologic  deficits.   LABORATORY DATA:  Sodium 140, potassium 3.5, BUN 8, creatinine 0.8.  WBC  5.2, hemoglobin 13, platelet count 314, MCV of 86.   Chest x-ray was negative.   ASSESSMENT:  1. Asthma exacerbation with a bronchitis component.  2. Chronic abdominal pain.  3. Chronic back pain.      Hettie Holstein, D.O.  Electronically Signed     ESS/MEDQ  D:  12/06/2006  T:  12/06/2006  Job:  [id number]   cc:   Lorelle Formosa, M.D.

## 2010-11-18 NOTE — Op Note (Signed)
NAMESILVER, ACHEY               ACCOUNT NO.:  [id number]   MEDICAL RECORD NO.:  [id number]          PATIENT TYPE:  AMB   LOCATION:  SDC                           FACILITY:  [facility]   PHYSICIAN:  Lindaann Slough, M.D.  DATE OF BIRTH:  12/22/1960   DATE OF PROCEDURE:  12/24/2006  DATE OF DISCHARGE:                               OPERATIVE REPORT   PREOPERATIVE DIAGNOSES:  1. Pelvic pain.  2. Hematuria.   POSTOPERATIVE DIAGNOSES:  1. Pelvic pain.  2. Hematuria.   PROCEDURE:  Cystoscopy, meatal dilation.   SURGEON:  Dr. Brunilda Payor.   ANESTHESIA:  General.   INDICATIONS:  The patient is a 50 year old female with a 2-year history  of left lower quadrant pain.  She had 1 episode of hematuria about 2  months ago.  CT scan showed no evidence of renal or ureteral stone.  She  is scheduled today for laparoscopy by Dr. Stefano Gaul and cystoscopy by  myself.   Under general anesthesia, the patient was prepped and draped and placed  in the dorsal lithotomy position.  A panendoscope was inserted in the  bladder.  The bladder mucosa was normal.  There was no stone or tumor in  the bladder.  There was no evidence of submucosal hemorrhage.  The  ureteral orifices were in normal position and shape with clear efflux.  The cystoscopy was done with both oblique and right angle lenses.  The  cystoscope was then removed.  The urethra was dilated up to #30-French.  Then, a latex-free catheter was then inserted in the bladder.   Under bimanual examination, there is no evidence of pelvic mass.  She is  status post hysterectomy.   The patient was then left in dorsal lithotomy position for laparoscopy  by Dr. Stefano Gaul.      Lindaann Slough, M.D.  Electronically Signed    MN/MEDQ  D:  12/24/2006  T:  12/24/2006  Job:  [id number]   cc:   Janine Limbo, M.D.  Fax: [phone]

## 2010-11-18 NOTE — H&P (Signed)
[name], [name]               ACCOUNT NO.:  [id number]   MEDICAL RECORD NO.:  [id number]          PATIENT TYPE:  INP   LOCATION:  2308                         FACILITY:  [facility]   PHYSICIAN:  Chelsea Helling, MD        DATE OF BIRTH:  1961-01-14   DATE OF ADMISSION:  06/01/2008  DATE OF DISCHARGE:                              HISTORY & PHYSICAL   ADMISSION DIAGNOSIS:  Status asthmaticus.   HISTORY OF PRESENT ILLNESS:  [name] is a 50 year old female who was  brought to the emergency room with asthma exacerbation.  This apparently  started 5-6 hours prior to arriving to the emergency room.  She was  having increasing dyspnea, cough, and wheezing.  She denied any fever.  She was using nebulizer treatments at home and received prednisone and  nebulizer treatments in the emergency room.  However, she continued to  deteriorate and required intubation and mechanical ventilation.  Initially upon intubation, she had difficulties to maintain her  oxygenation.  She is also noted to have significant air trapping on the  ventilator.  Remainder of the information is obtained from review of her  medical records.   PAST MEDICAL HISTORY:  Significant for:  1. Arthritis and chronic back pain.  2. Obstructive sleep apnea.  3. Hypothyroidism.  4. Anxiety.  5. Depression.  6. Asthma with apparently previous episodes of intubation.   PAST SURGICAL HISTORY:  Significant for appendectomy in 1987, C-section  in 1988, cholecystectomy in 1990, foot surgery in 1995, hysterectomy in  2000.   ALLERGIES:  Her allergies are listed as PENICILLIN which causes dyspnea,  SHELLFISH which causes flushing and fainting, STADOL which causes  hallucinations,  LASIX which causes dyspnea and hives, and IODINE which  causes flushing and fainting, and ELAVIL.   MEDICATIONS:  Her outpatient medications are listed as,  1. Albuterol.  2. Combivent.  3. Neurontin 400 mg q.i.d.  4. Cymbalta 30 mg daily.  5. Relafen.  6. Morphine 30 mg b.i.d.   FAMILY HISTORY:  Significant for her mother who died of ovarian cancer  at age 46 and her father who died in his 19s of unknown cause.   SOCIAL HISTORY:  In the computer, it is listed as the patient being  divorced, working as a Engineer, maintenance at [employer].  She has two children.  There is no significant history of alcohol or tobacco abuse.   PHYSICAL EXAMINATION:  GENERAL:  She is seen in the emergency room.  She  is intubated, sedated, and paralyzed.  VITAL SIGNS:  Temperature is 97.1, blood pressure is 155/99, heart rate  is 114 and regular, respiratory rate is 14, oxygen saturation is 99%.  HEENT:  Pupils were equal, but sluggish.  Does not appear to be in sinus  discharge.  She has an endotracheal tube in place.  NECK:  There is no jugular venous distention.  No thyromegaly.  No  lymphadenopathy.  HEART:  S1 and S2, tachycardiac, but no murmur.  CHEST:  She has diffuse expiratory wheezing.  ABDOMEN:  Soft, nontender, distended.  Tinkling bowel sounds.  GENITOURINARY:  She has a Foley catheter in place.  There is no obvious  lesions.  EXTREMITIES:  There is no edema, cyanosis, or clubbing.  NEUROLOGIC:  She is intubated, sedated, and paralyzed.   Chest x-ray shows, no acute cardiopulmonary disease with an endotracheal  tube in good position.   LABORATORY DATA:  Sodium is 136, potassium is 3.8, chloride is 105, CO2  is 18, BUN is 15, creatinine is 1.1, glucose is 214.  Initial blood gas  showed pH of 6.97, pCO2 109, and pO2 of 33.  Followup arterial blood gas  showed pH 7.33, pCO2 of 62.4, and pO2 of 189.   IMPRESSION:  1. Status asthmaticus.  She is to be admitted to the intensive care      unit.  I will adjust her ventilatory settings to avoid the      development of intrinsic positive end-expiratory pressure; if      needed, we could add bicarbonate to intravenous fluids to      compensate for any acidosis, which may develop.  In the meantime, I       will continue her on Solu-Medrol, around-the-clock nebulizer      treatments, and start her on Singulair and Claritin.  I will also      start her empirically on Avelox.  I will then follow up her chest x-      ray and arterial blood gas.  I will also start her on sedation      protocol keeping her well sedated to assist with ventilation.  2. History of multiple allergies as listed above.  Once her clinical      status improves, she may need further evaluation of this to assist      with therapy of her asthma symptoms.  3. History of hypothyroidism.  Per her listed medications, she is not      on any thyroid replacement therapy at this time.  I will check her      thyroid function and determine if she does need to be started on      this.  4. Abdominal distention.  I will have the nursing staff place a NG      tube suction and follow up on a KUB,and then determine if any      further interventions are necessary.  5. Combined metabolic and respiratory acidosis.  I suspect her      metabolic acidosis is related to lactic acidosis from her      respiratory distress.  I will continue her on intravenous fluids      and follow up on her electrolytes and arterial blood gas and      determine if any further intervention is necessary.  With regards      to her respiratory acidosis,  I will adjust her ventilatory      settings accordingly again allowing for some degree of permissive      hypercapnia in the attempt to avoid development of intrinsic      positive end-expiratory pressure.  6. Sinus tachycardia.  This is likely related to respiratory distress      as well as reaction to multiple doses of inhaler therapy.  7. History of obstructive sleep apnea.  We will need to determine if      she needs to be on continuous positive airway pressure therapy      after extubation.  This may also have significance at the time of      extubation  with regard to her upper airway.  8. History of depression  and anxiety.  She is on Cymbalta, as an      outpatient, I will hold this at the present time, but she will need      to have this restarted, as her clinical status improves.  9. History of back pain and arthritis.  She is listed to being on      scheduled doses of Neurontin, morphine, and Relafen.  She is going      to be on the sedation protocol while she is on the ventilator.      Once she is extubated, we will need to reassess her pain symptoms      and restart her medications as needed.  10.Hyperglycemia.  I will start her on the hyperglycemia protocol and      monitor her blood sugars.  11.Sedation.  She will be on the sedation protocol while on the      ventilator.  12.Stress ulcer prophylaxis with Protonix.  13.Deep venous thrombosis prophylaxis with subcutaneous heparin.      Chelsea Helling, MD  Electronically Signed     VS/MEDQ  D:  06/01/2008  T:  06/01/2008  Job:  [id number]

## 2010-11-18 NOTE — Op Note (Signed)
[name], [name]               ACCOUNT NO.:  [id number]   MEDICAL RECORD NO.:  [id number]          PATIENT TYPE:  AMB   LOCATION:  SDC                           FACILITY:  [facility]   PHYSICIAN:  Janine Limbo, M.D.DATE OF BIRTH:  1961/06/10   DATE OF PROCEDURE:  12/24/2006  DATE OF DISCHARGE:                               OPERATIVE REPORT   PREOPERATIVE DIAGNOSES:  1. Pelvic pain.  2. Bladder atony.  3. Status post hysterectomy.   POSTOPERATIVE DIAGNOSES:  1. Pelvic pain.  2. Bladder atony.  3. Status post hysterectomy.  4. Pelvic adhesions.  5. Abdominal mass.   PROCEDURE:  1. Diagnostic laparoscopy.  2. Laparoscopic lysis of adhesions.  3. Laparoscopic suspension of the left ovary.  4. Laparoscopic resection of abdominal mass.  5. Cystoscopy with dilatation of the urethra.   SURGEON:  Dr. Leonard Schwartz.   CO-SURGEON:  Dr. Lindaann Slough.   ANESTHETIC:  Is general.   DISPOSITION:  [name] is a 50 year old female, para 1-1-0-3, who  presents with pelvic pain.  The patient is status post hysterectomy.  The patient has been evaluated, and no etiology has been found for her  abdominal pain.  She does report some uterine atony, and Dr. Brunilda Payor is  scheduled to do cystoscopy.  CT scan and ultrasound of the pelvis have  been negative.  The patient understands the indications for her surgical  procedure, and she accepts the risks of, but not limited to, anesthetic  complications, bleeding, infection, and possible damage to the  surrounding organs.   FINDINGS:  The uterus was surgically absent.  The ovaries appeared  normal except that both ovaries were densely adhered to the vaginal  cuff.  There were adhesions that were moderate to dense between the  bowel and the left broad ligament.  There were adhesions between the  fallopian tubes, the ovaries, and the pelvis.  There was a 2-cm cystic  mass present in the epiploica of the large bowel located in the  left  lower quadrant.  The etiology of this mass is not certain.  The appendix  was surgically absent.  The gallbladder was surgically absent.  The  remainder of the bowel and the upper abdomen appeared normal.  There was  no evidence of endometriosis present.  The cystic mass contained a  yellow fluid that was slightly thickened and oily.  There was no  evidence of infection.  There were no other masses present in the  abdominal cavity or in the pelvis that made [place] suspicious for  malignancy.   PROCEDURE:  The patient was taken to the operating room where a general  anesthetic was given.  Dr. Brunilda Payor performed cystoscopy and dilatation of  the urethra, and he will dictate his portion of the operative note.  Dr.  Stefano Gaul then prepped the abdomen, perineum, and vagina with multiple  layers of antiseptic solution.  A catheter had previously been placed in  the bladder.  Examination under anesthesia was performed.  A padded  sponge stick was placed in the vagina.  The patient was then sterilely  draped.  The subumbilical area was injected with 5 mL of half percent  Marcaine with epinephrine.  A subumbilical incision was made and carried  sharply through the subcutaneous tissue, the fascia, and the anterior  peritoneum.  A pursestring suture was placed through the fascia.  The  Hassan cannula was secured into place.  A pneumoperitoneum was then  obtained.  The laparoscope was inserted.  The pelvis was carefully  inspected.  The lower abdomen was injected in 2 separate places using an  additional 5 mL of half percent Marcaine with epinephrine.  Two 5-mm  trocars were placed in the abdominal cavity under direct visualization.  Pictures were taken of the patient's pelvic anatomy.  The cystic mass  was noted in the epiploica of the large bowel, and a picture was taken  as well.  We were ready then to began our operative procedure.  A  combination of blunt dissection, sharp dissection, and  hydrodissection  were used to remove the scar tissue from the right ovary and the cuff of  the vagina.  The adhesions between the bowel and the left adnexa were  then isolated, cauterized, and cut.  Pictures were taken throughout the  operative procedure.  We then hydrodissected the left ovary and the left  fallopian tube from the apex of the vagina.  Sharp and blunt dissection  were then used to free the ovary from the cuff of the vagina.  Bipolar  cautery was used for hemostasis.  The bladder was filled with 250 mL of  sterile milk, and there was no evidence of leakage.  We felt that the  bladder was intact.  We then suspended the left ovary and the left  fallopian tube from the left round ligament and left pelvic sidewall so  that it would not fall back in the pelvis and once again become adhered.  The pelvis was vigorously irrigated, and hemostasis was noted to be  adequate.  We then isolated the mass on the epiploica of the bowel.  Zero Vicryl Endoloops were used to tie off the epiploica to the mass.  Care was taken not to damage or to restrict the bowel.  The mass did  ruptured as we were placing it into an Endobag.  Yellow oily fluid was  noted.  The mass was placed in an Endobag which had been placed through  the abdominal port.  A 5-mm laparoscope was placed in the left lower  quadrant so that we could guide the mass into the bag.  The mass was  then removed through the umbilical port and sent to pathology.  The 10-  mm laparoscope was then reinserted into the umbilicus.  The pelvis was  irrigated with 1000 mL of lactated Ringer's.  The irrigation fluid was  aspirated.  All of the oily material was noted to be removed.  Hemostasis was noted to be adequate throughout.  Pictures were taken  throughout our operative procedure.  We were ready to then end our  procedure.  The suprapubic trocars were removed under direct  visualization.  The pneumoperitoneum was allowed to escape.  The  Hassan  cannula was removed.  The pursestring suture of the fascia was tied, and  figure-of-eight sutures were placed in the subumbilical fascia as well.  The subcutaneous layer was closed using 0 Vicryl.  The skin of all three  incisions was then closed using 3-0 Monocryl.  Sponge, needle, and  instrument counts were correct.  Estimated blood loss was  10 mL.  The  patient tolerated her procedure well.  She was awakened from her  anesthetic without difficulty.  She will be discharged to home later  today.  The patient received gentamicin and clindamycin preoperatively.  She received 30 mg of Toradol intraoperatively.  The patient was taken  to the recovery room in stable condition, having tolerated her procedure  well.  The mass from the epiploic of the bowel was sent to pathology for  evaluation.   FOLLOW-UP INSTRUCTIONS:  The patient was given a prescription for  Vicodin, and she will take 1 or 2 tablets every 4 hours as needed for  pain.  She will return to see Dr. Stefano Gaul in 2-3 weeks for follow-up  examination.  She was given a copy of the postoperative instruction  sheet as prepared by the [facility] for patients who  have undergone laparoscopy.  She will call for questions or concerns.      Janine Limbo, M.D.  Electronically Signed     AVS/MEDQ  D:  12/24/2006  T:  12/24/2006  Job:  [id number]   cc:   Lindaann Slough, M.D.  Fax: [phone]   Lorelle Formosa, M.D.  Fax: [phone]

## 2010-11-18 NOTE — H&P (Signed)
[name], [name]               ACCOUNT NO.:  [id number]   MEDICAL RECORD NO.:  [id number]          PATIENT TYPE:  AMB   LOCATION:  SDC                           FACILITY:  [facility]   PHYSICIAN:  Janine Limbo, M.D.DATE OF BIRTH:  1961/03/16   DATE OF ADMISSION:  12/24/2006  DATE OF DISCHARGE:                              HISTORY & PHYSICAL   HISTORY OF PRESENT ILLNESS:  Ms. Shor is a 50 year old female, para 1,  1, 0, 3, who presents for a diagnostic laparoscopy and cystoscopy.  The  patient has been followed at the [facility] and  Gynecology division of [facility].  She complains of  severe pelvic pain and severe left lower quadrant pain.  The patient is  status post vaginal hysterectomy in the year 2000 for menorrhagia,  dysmenorrhea, and anemia.  Surgical pathology was negative.  The patient  has had multiple consults and no etiology has been found for the  patient's discomfort.  She has been seen in the emergency department for  evaluation.  Ultrasound and computer tomography scans have been normal.  The patient did have a colonoscopy performed that was said to be normal.  The patient is now living in [place] once again and she presents for  evaluation.   OBSTETRICAL HISTORY:  The patient has had one vaginal delivery at term  and one cesarean section.  Preterm for the delivery of twins.   ALLERGIES:  The patient states that she is allergic to PENICILLIN,  STADOL, SHELLFISH, LATEX, AND AMITRIPTYLINE.   PAST MEDICAL HISTORY:  The patient has a past history of arthritis,  sleep apnea, hypothyroidism, anxiety, asthma, depression, and she  reports that there is a questionable history of  lupus or multiple  sclerosis.  She had a cesarean section in 1988.  She had a hysterectomy  in 2000.  She had an appendectomy in 1987. She had a cholecystectomy in  1990.  She had surgery on her foot in 1995.   CURRENT MEDICATIONS:  1. Zomig 5 mg  p.r.n.  2. Neurontin 300 mg t.i.d.  3. Diazepam 5 mg p.r.n.  4. Flexeril 5 mg t.i.d.  5. Advair 100/50.  6. Prednisone 10 mg.  7. Albuterol.  8. Vicodin.   SOCIAL HISTORY:  The patient denies cigarette use, alcohol use, and  recreational drug use.   FAMILY HISTORY:  The patient's mother was said to have died at age 12 of  ovarian cancer.   REVIEW OF SYSTEMS:  Please see history of present illness.   PHYSICAL EXAMINATION:  VITAL SIGNS:  Height is 5 feet, 9 inches, weight  is 214 pounds.  HEENT:  Within normal limits except that the patient sometimes appears  stressed.  CHEST:  Clear.  HEART:  Regular rate and rhythm.  BREASTS:  Are without masses.  ABDOMEN:  Nontender.  EXTREMITIES:  Are grossly normal.  NEUROLOGIC EXAM:  Is grossly normal.   PELVIC EXAM:  External genitalia is normal.  The vagina is normal.  Cervix is absent.  Uterus is absent.  Adnexa - no masses except there is  tenderness on the left and rectovaginal exam confirms.   ASSESSMENT:  1. Pelvic pain of uncertain etiology.  2. Status post hysterectomy.  3. Sleep apnea.  4. Arthritis.  5. Asthma.  6. Hypothyroidism.  7. Anxiety.  8. Depression.   PLAN:  The patient will undergo a diagnostic laparoscopy.  She will also  have cystoscopy performed by Dr. Brunilda Payor because of the questionable  history of bladder atony.  The patient understands the indications for  her surgical procedure and she accepts the risk of, but not limited to,  anesthetic complications, bleeding, infections, and possible damage to  the surrounding organs.      Janine Limbo, M.D.  Electronically Signed     AVS/MEDQ  D:  12/23/2006  T:  12/23/2006  Job:  [id number]   cc:   Lindaann Slough, M.D.  Fax: [phone]   Lorelle Formosa, M.D.  Fax: [phone]

## 2010-11-18 NOTE — Discharge Summary (Signed)
NAMEDOROTHIE, WAH               ACCOUNT NO.:  [id number]   MEDICAL RECORD NO.:  [id number]          PATIENT TYPE:  INP   LOCATION:  6708                         FACILITY:  [facility]   PHYSICIAN:  Oley Balm. Sung Amabile, MD   DATE OF BIRTH:  [birth year]   DATE OF ADMISSION:  06/01/2008  DATE OF DISCHARGE:  06/05/2008                               DISCHARGE SUMMARY   ADMISSION DIAGNOSES:  1. Status asthmaticus.  2. Ventilator-dependent respiratory failure.  3. History of multiple allergies.  4. Combined metabolic and respiratory acidosis.  5. Sinus tachycardia.  6. History of obstructive sleep apnea.  7. Chronic back pain and arthritis.  8. Hyperglycemia.   DISCHARGE DIAGNOSES:  1. Status asthmaticus.  2. Ventilator-dependent respiratory failure.  3. History of multiple allergies.  4. Combined metabolic and respiratory acidosis.  5. Sinus tachycardia.  6. History of obstructive sleep apnea.  7. Chronic back pain and arthritis.  8. Hyperglycemia.   HISTORY OF PRESENT ILLNESS:  Please refer to the admission history and  physical performed by Dr. Craige Cotta for details regarding her initial  presentation.  Briefly, she is a 50 year old woman with a history of  asthma since childhood who presented with 5-6 hours of progressive  dyspnea and required intubation upon arrival to the emergency  department.  She was admitted with a diagnosis of status asthmaticus and  vent-dependent respiratory failure as well as the other admission  diagnoses listed above.   HOSPITAL COURSE:  She was treated in the usual manner with systemic  steroids, nebulized bronchodilators, mechanical ventilatory support, DVT  prophylaxis, stress ulcer prophylaxis, and glycemic control.  She  responded well to these therapies.  She extubated herself in the early  morning hours of June 03, 2008, and had no problems maintaining her  own respiration.  She remained in the intensive care unit during that  day, and she was  transferred out of the intensive care unit on June 04, 2008 to a regular floor.  At the time of discharge, she has markedly  improved with no wheezes heard.  Further discussion regarding her asthma  management indicates that she is noncompliant with her controller  regimen, failing to see a need for chronic controller therapy.  Her  specific words to me were I do not have asthma every day.  I counseled  her in great detail regarding the need for control of asthma even when  symptoms do not exist.  I emphasized that Advair is her most important  controller medication.  She had been prescribed this previously by Dr.  Delford Field but had stopped taking for the reason stated above.   DISCHARGE STATUS:  Much improved.   DISCHARGE MEDICATIONS:  1. Advair 250/50 one inhalation q.12 h. - prescription written.  2. Prednisone 20 mg, dispensed #10, take 2 p.o. q.a.m. x3 days, then 1      p.o. q.a.m. x3 days, then stop - prescription written.  3. Albuterol inhaler 2 actuations q.4 h. p.r.n.  4. Neurontin 400 mg q.i.d.  5. Cymbalta 30 mg daily.  6. MS Contin 30 mg b.i.d.  7. Relafen -  take as previously prescribed.   FOLLOWUP INSTRUCTIONS:  She is to follow up with Dr. [name], and  an appointment has been made on June 14, 2008, at 9:30 a.m.  She is  to call the office sooner if she develops any further difficulty with  her asthma.      Oley Balm Sung Amabile, MD  Electronically Signed     DBS/MEDQ  D:  06/05/2008  T:  06/06/2008  Job:  [id number]   cc:   Charlcie Cradle. Delford Field, MD, FCCP

## 2010-11-21 NOTE — Cardiovascular Report (Signed)
[facility]  Patient:    [name], [name]                      MRN: [id number] Proc. Date: 09/16/99 Adm. Date:  [id number] Attending:  Ricki Rodriguez CC:         Cardiac Catheterization Laboratory                        Cardiac Catheterization  CINE:  #[id number]  PROCEDURE:  Left heart catheterization, selective coronary angiography, left ventricular function study, aortography.  CARDIOLOGIST:  Ricki Rodriguez, M.D.  INDICATIONS:  This 50 year old black female with substernal chest pain, has electrocardiogram changes on the treadmill stress test, along with a family history of premature coronary artery disease.  APPROACH:  Right femoral artery using the 6-French diagnostic catheter.  A Perclose suture was applied at the end of the procedure.  HEMODYNAMIC DATA: Left ventricular pressure:  111/8 mmHg. Aortic pressure:  111/79.  LEFT VENTRICULOGRAM:  The left ventriculogram showed normal left ventricular systolic function with an ejection fraction of 65%.  AORTOGRAM:  The aortogram was also normal.  CORONARY ANATOMY: 1. Left main coronary artery:  The left main coronary artery was double-barreled. 2. Left anterior descending coronary artery:  The left anterior descending artery    was unremarkable. 3. Left circumflex coronary artery:  The left circumflex coronary artery was also    unremarkable. 4. Right coronary artery:  The right coronary artery was dominant and unremarkable.  IMPRESSION: 1. Normal coronaries. 2. Normal left ventricular systolic function.  RECOMMENDATIONS:  Noncardiac chest pain evaluation. DD:  09/16/99 TD:  09/16/99 Job: [id number]

## 2010-11-21 NOTE — Discharge Summary (Signed)
[place]. [facility]  Patient:    [name], [name]                      MRN: [id number] Adm. Date:  [id number] Disc. Date: [id number] Attending:  Orpah Cobb S                           Discharge Summary  PRINCIPAL DIAGNOSES:  1. Chest pain.  2. Reflux esophagitis.  3. Hiatal hernia.  4. Anxiety.  PRINCIPAL PROCEDURE:  1. Left heart catheterization.  Selected _________ angiography, left renal     function study, and descending aortography done by Dr. Orpah Cobb on     September 16, 1999.  2. Upper GI series done on September 16, 1999 showing hiatal hernia and reflux     esophagitis.  3. CT of the brain done on September 16, 1999 without any significant findings.  DISCHARGE MEDICATIONS:  1. Prevacid 30 mg 1 p.o. daily.  2. Toprol XL 50 mg 1/2 tablet twice daily.  3. Xanax 0.25 mg 1 daily.  DISCHARGE DIET:  Low fat, low salt diet as tolerated.  DISCHARGE ACTIVITY:  As tolerated.  SPECIAL INSTRUCTIONS:  The patient to notify physician if she has right groin pain, swelling, or discharge.  FOLLOWUP:  Follow-up appointment by Dr. Orpah Cobb in two weeks.  CONDITION ON DISCHARGE:  Stable.  HISTORY:  This 50 year old black female complained of chest pain radiating to left shoulder and arm area, along with a positive treadmill stress test.  The patient also complained of left-sided numbness without speech and vision disturbance.  PHYSICAL EXAMINATION:  VITAL SIGNS:  Temperature 98.6, pulse 86, respirations 18, blood pressure 112/76. Height 5 feet 9 inches.  Weight 195 pounds.  GENERAL:  The patient is alert and oriented x 3.  HEENT:  The head is normocephalic, atraumatic.  Eyes brown with pupils reacting to light.  Extraocular movement intact.  Conjunctivae pink.  Sclerae nonicteric. Ears, nose and throat:  Mucous membranes pink and moist.  NECK:  No JVD, no carotid bruit.  LUNGS:  Clear to auscultation.  HEART:  Normal, S1 and S2.  ABDOMEN:   Soft and nontender.  EXTREMITIES:  No edema, cyanosis, clubbing.  CENTRAL NERVOUS SYSTEM:  Cranial nerves II-XII grossly intact.  Bilateral, equal grips.  Normal ambulation.  LABORATORY DATA:  Normal WBC count, platelet count.  Borderline hemoglobin and hematocrit.  Normal electrolytes, BUN, and creatinine.  Normal blood sugar. Normal PT/PTT.  Normal CK-MBs with a high total CK.  EKG showed normal sinus rhythm with frequent premature atrial complex and chest  x-ray was without any acute disease.  Heart size was normal.  CT of the brain was unremarkable.  Upper GI series showed hiatal hernia and reflux esophagitis. Left heart catheterization showed normal coronaries and a normal left systolic function.   HOSPITAL COURSE:  The patient was admitted to telemetry and myocardial infarction was ruled out.  She underwent cardiac catheterization that was with normal coronaries and normal left systolic function.  She had upper GI series that showed sliding hiatal hernia and reflux esophagitis.  She also underwent a CT of the brain that was unremarkable.  The patient was started on Prevacid, Toprol XL to control heart rate variability, and Xanax for anxiety.  CONDITION ON DISCHARGE:  She was discharged home in satisfactory condition.  FOLLOWUP:  Followup by me in two weeks. DD:  09/17/99 TD:  09/17/99 Job:  [id number]

## 2010-11-27 ENCOUNTER — Encounter: Payer: Self-pay | Admitting: Internal Medicine

## 2010-12-09 ENCOUNTER — Ambulatory Visit: Payer: Medicare Other | Admitting: Internal Medicine

## 2010-12-17 ENCOUNTER — Other Ambulatory Visit: Payer: Self-pay | Admitting: Critical Care Medicine

## 2010-12-27 ENCOUNTER — Other Ambulatory Visit: Payer: Self-pay | Admitting: Adult Health

## 2011-01-28 ENCOUNTER — Observation Stay (HOSPITAL_COMMUNITY)
Admission: EM | Admit: 2011-01-28 | Discharge: 2011-01-30 | Disposition: A | Discharge status: Home / Self Care | Payer: Medicare Other | Attending: Internal Medicine | Admitting: Internal Medicine

## 2011-01-28 DIAGNOSIS — R5381 Other malaise: Secondary | ICD-10-CM | POA: Insufficient documentation

## 2011-01-28 DIAGNOSIS — R5383 Other fatigue: Secondary | ICD-10-CM | POA: Insufficient documentation

## 2011-01-28 DIAGNOSIS — R262 Difficulty in walking, not elsewhere classified: Secondary | ICD-10-CM | POA: Insufficient documentation

## 2011-01-28 DIAGNOSIS — M549 Dorsalgia, unspecified: Principal | ICD-10-CM | POA: Insufficient documentation

## 2011-01-28 DIAGNOSIS — J45909 Unspecified asthma, uncomplicated: Secondary | ICD-10-CM | POA: Insufficient documentation

## 2011-01-28 DIAGNOSIS — F3289 Other specified depressive episodes: Secondary | ICD-10-CM | POA: Insufficient documentation

## 2011-01-28 DIAGNOSIS — R110 Nausea: Secondary | ICD-10-CM | POA: Insufficient documentation

## 2011-01-28 DIAGNOSIS — F329 Major depressive disorder, single episode, unspecified: Secondary | ICD-10-CM | POA: Insufficient documentation

## 2011-01-28 DIAGNOSIS — G4733 Obstructive sleep apnea (adult) (pediatric): Secondary | ICD-10-CM | POA: Insufficient documentation

## 2011-01-28 DIAGNOSIS — R634 Abnormal weight loss: Secondary | ICD-10-CM | POA: Insufficient documentation

## 2011-01-28 DIAGNOSIS — IMO0001 Reserved for inherently not codable concepts without codable children: Secondary | ICD-10-CM | POA: Insufficient documentation

## 2011-01-28 DIAGNOSIS — G35 Multiple sclerosis: Secondary | ICD-10-CM | POA: Insufficient documentation

## 2011-01-28 DIAGNOSIS — F411 Generalized anxiety disorder: Secondary | ICD-10-CM | POA: Insufficient documentation

## 2011-01-28 LAB — URINE MICROSCOPIC-ADD ON

## 2011-01-28 LAB — URINALYSIS, ROUTINE W REFLEX MICROSCOPIC
Bilirubin Urine: NEGATIVE
Glucose, UA: NEGATIVE mg/dL
Hgb urine dipstick: NEGATIVE
Ketones, ur: NEGATIVE mg/dL
Nitrite: NEGATIVE
Protein, ur: NEGATIVE mg/dL
Specific Gravity, Urine: 1.005 (ref 1.005–1.030)
Urobilinogen, UA: 0.2 mg/dL (ref 0.0–1.0)
pH: 7 (ref 5.0–8.0)

## 2011-01-29 ENCOUNTER — Observation Stay (HOSPITAL_COMMUNITY): Payer: Medicare Other

## 2011-01-29 LAB — POCT I-STAT, CHEM 8
BUN: 8 mg/dL (ref 6–23)
Calcium, Ion: 1.08 mmol/L — ABNORMAL LOW (ref 1.12–1.32)
Chloride: 100 mEq/L (ref 96–112)
Creatinine, Ser: 1 mg/dL (ref 0.50–1.10)
Glucose, Bld: 97 mg/dL (ref 70–99)
HCT: 41 % (ref 36.0–46.0)
Hemoglobin: 13.9 g/dL (ref 12.0–15.0)
Potassium: 3 mEq/L — ABNORMAL LOW (ref 3.5–5.1)
Sodium: 141 mEq/L (ref 135–145)
TCO2: 28 mmol/L (ref 0–100)

## 2011-01-29 LAB — CBC
HCT: 38.4 % (ref 36.0–46.0)
Hemoglobin: 13.1 g/dL (ref 12.0–15.0)
MCH: 28.4 pg (ref 26.0–34.0)
MCHC: 34.1 g/dL (ref 30.0–36.0)
MCV: 83.3 fL (ref 78.0–100.0)
Platelets: 300 10*3/uL (ref 150–400)
RBC: 4.61 MIL/uL (ref 3.87–5.11)
RDW: 13.9 % (ref 11.5–15.5)
WBC: 5 10*3/uL (ref 4.0–10.5)

## 2011-01-29 LAB — MRSA PCR SCREENING: MRSA by PCR: NEGATIVE

## 2011-01-29 MED ORDER — GADOBENATE DIMEGLUMINE 529 MG/ML IV SOLN
20.0000 mL | Freq: Once | INTRAVENOUS | Status: AC | PRN
  Administered 2011-01-29: 20 mL via INTRAVENOUS

## 2011-01-30 DIAGNOSIS — F319 Bipolar disorder, unspecified: Secondary | ICD-10-CM

## 2011-01-30 LAB — CK: Total CK: 655 U/L — ABNORMAL HIGH (ref 7–177)

## 2011-01-30 LAB — TSH: TSH: 0.186 u[IU]/mL — ABNORMAL LOW (ref 0.350–4.500)

## 2011-01-30 LAB — PREALBUMIN: Prealbumin: 18.3 mg/dL (ref 17.0–34.0)

## 2011-01-30 LAB — T4, FREE: Free T4: 1.16 ng/dL (ref 0.80–1.80)

## 2011-01-30 NOTE — Consult Note (Signed)
[name], [name]               ACCOUNT NO.:  [id number]  MEDICAL RECORD NO.:  [id number]  LOCATION:  5001                         FACILITY:  [facility]  PHYSICIAN:  Levie Heritage, MD       DATE OF BIRTH:  1960/11/20  DATE OF CONSULTATION:  01/30/2011 DATE OF DISCHARGE:                                CONSULTATION   REFERRING PHYSICIAN:  Calvert Cantor, MD.  REASON FOR CONSULTATION:  Questionable multiple sclerosis.  CHIEF COMPLAINT:  Generalized weakness, feeling all over with problem urination.  BRIEF HISTORY OF PRESENT ILLNESS:  This patient is a 50 year old woman with significant psychiatric disorders for what she has been on disability, has been complaining of feeling pains all over and feeling generalized weak, tremulousness, states that she has been having difficulty with urination as well.  There are no other neurological complaints.  PAST MEDICAL HISTORY:  Questionable multiple sclerosis, diagnosed in 2004 or 2005 in [place].  She has never been on medication and does not know any detail of diagnosis either.  Asthma, panic attacks, anxiety disorders, bipolar disorder, fibromyalgia, C-section, hysterectomy, cholecystectomy, appendicectomy.  SOCIAL HISTORY:  She is living with a friend.  She is on disability for her bipolar disorder.  She is an ex-smoker, who quit in 2010.  Does not drink and denies illicit drug abuse.  FAMILY HISTORY:  Multiple family members with hypertension.  REVIEW OF SYSTEM:  She says that she has lost significant 40-pound weight in last couple months.  Denies any chest pain.  Denies any shortness of breath.  Denies any productive cough or sputum.  Denies any nausea, vomiting, diarrhea.  Denies any fevers.  Denies any rashes. Rest of 10-organ review of system unremarkable.  REVIEW OF CLINICAL DATA:  I have seen the patient's MRI of the brain with and without contrast, which is completely a normal study without any evidence for demyelinating  disease, incidental arachnoid herniation into sella.  MRI of the lumbar spine also was performed yesterday that is showing only mild disk bulge of L4-5 and S1 without any significant abnormality.  CK is high at 655.  CBC is unremarkable.  I-STAT Chem-8 is also unremarkable, except low potassium.  PHYSICAL EXAMINATION:  GENERAL:  Currently, comfortably lying down in bed.  She is awake and oriented x3 in no acute distress. VITAL SIGNS:  Temperature 97.9 degrees Fahrenheit, pulse 70 per minute, systolic BP 120, diastolic 84. HEENT:  Bilateral pupils are reactive to light and accommodation.  No field cut noted.  Moves eyes to all direction.  Symmetrical face for sensation and strength testing.  Midline tongue without atrophy or fasciculation.  Palate elevates symmetrically bilaterally. NEUROLOGIC:  Motor:  Reveals strength 5/5 all over, although, she is tender to touch all over generalized.  Sensory:  Admits to feel light touch sensation, normal and casual all over.  The patient cannot stand up and walk around independently without any help.  The patient has generalized tremulousness and shakiness.  IMPRESSION:  A 50 year old woman with generalized shakiness and tremulousness, feeling overall not well, but has no neurological deficits otherwise.  Her MRI of the lumbosacral spine and brain is unremarkable for any demyelinating condition.  The patient  has significant psychiatric issues and has been on disability for her bipolar disorder.  She has been following her psychiatrist for her psych issues in [place].  My impression is that her current clinical scenario is less likely result of any primary CNS pathology and is rather the extension of her known fibromyalgia and bipolar disorder.  PLAN:  I would suggest giving the patient some antianxiety medications for short-term basis before she sees her psychiatrist.  I agree by performing her by getting her TSH values.  There is no  additional neurological recommendations recommended at this time.  I would rather suggest stopping the regular steroids for her case.  Neurology will sign off, but please do not hesitate to contact me back if you have any questions.  I have discussed the patient with Dr. Calvert Dunn in detail who is the primary hospitalist of the patient.    ______________________________ Levie Heritage, MD     WS/MEDQ  D:  01/30/2011  T:  01/30/2011  Job:  [id number]  Electronically Signed by Levie Heritage MD on 01/30/2011 04:59:27 PM

## 2011-02-01 NOTE — Consult Note (Signed)
  [name]               ACCOUNT NO.:  [id number]  MEDICAL RECORD NO.:  [id number]  LOCATION:  5001                         FACILITY:  [facility]  PHYSICIAN:  [name], MDDATE OF BIRTH:  January 30, 1961  DATE OF CONSULTATION:  01/30/2011 DATE OF DISCHARGE:  01/30/2011                                CONSULTATION   HISTORY OF PRESENT ILLNESS:  [name] is a 50 year old African American female who was admitted to the [facility] with questionable multiple sclerosis, asthma, arthritis, fibromyalgia, anxiety, and depression.  The patient reported she has been diagnosed with bipolar disorder by her primary psychiatrist at [facility] and has been given different medications including Depakote, Risperdal, Lamictal, and Neurontin.  The patient reported that she has a reaction to the Depakote and Risperdal but she is able to tolerate Neurontin and Lamictal.  She also takes multiple other medications for pain.  The patient reported that she has been anxious, nervous, and feels like her memory was shortened.  The patient denied any symptoms of depression, mania, or psychosis.  MEDICAL PROBLEMS: 1. Asthma. 2. Gastroesophageal reflux disease. 3. Obstructive sleep apnea. 4. Morbid obesity. 5. Fibromyalgia.  SOCIAL HISTORY:  She smoked tobacco a pack a day for 4 years, stopped 2010.  Has no history of drugs and drinking alcohol.  She is currently disabled and trying to get benefits.  The patient's sister resides with her.  Her brother was next to her bed during this visit.  FAMILY HISTORY:  Hypertension is present.  MENTAL STATUS EXAMINATION:  The patient appeared as for her stated age, overweight, lying down on the bed with nervousness and anxiety.  She was having difficulty to recall the names of the providers and medications but she was able to do with the effort.  Her stated mood was anxious. Her affect was appropriate.  She has now normal rate,  rhythm, and volume of speech.  Her thought process is linear and goal-directed.  She has fair insight, judgment, and impulse control.  She has no suicidal or homicidal ideations.  She has no evidence of psychosis or mania.  DIAGNOSES: 1. Bipolar disorder, most recent episode is unspecified. 2. Fibromyalgia. 3. Obstructive sleep apnea.  TREATMENT PLAN:  I recommended no new medications during this hospitalization.  The patient was recommended to follow up with her primary psychiatrist, Dr. Tomasa Dunn, earlier than the scheduled appointment for reconsultation of her medications.     Chelsea Slipper, MD     JRJ/MEDQ  D:  01/30/2011  T:  01/31/2011  Job:  [id number]  Electronically Signed by Leata Mouse MD on 02/01/2011 01:37:54 PM

## 2011-02-06 NOTE — H&P (Signed)
Chelsea, Dunn               ACCOUNT NO.:  [id number]  MEDICAL RECORD NO.:  [id number]  LOCATION:  5001                         FACILITY:  [facility]  PHYSICIAN:  Calvert Cantor, M.D.     DATE OF BIRTH:  02/02/1961  DATE OF ADMISSION:  01/29/2011 DATE OF DISCHARGE:                             HISTORY & PHYSICAL   PRIMARY CARE PHYSICIAN:  Lorelle Formosa, M.D.  PULMONOLOGIST:  Charlcie Cradle. Delford Field, MD, FCCP  NEUROLOGIST:  Dr. Adrian Prows at [facility]  PRESENTING COMPLAINT:  Generalized weakness and difficulty urinating.  HISTORY OF PRESENT ILLNESS:  This is a 50 year old female with multiple sclerosis, asthma, arthritis, fibromyalgia, anxiety and depression.  The patient states that 3 days ago, she woke up and noticed severe weakness.  She had trouble ambulating.  In addition, she complained of generalized pain throughout her body.  This continued, but that has not worsened over the past 3 days.  Today, she noted that she had difficulty with urinating as well.  She also complains of nausea since yesterday. She has been eating and has not vomited.  The patient comes into the hospital because she states that she is having significant trouble with ambulating and taking care of her daily needs.  PAST MEDICAL HISTORY: 1. Multiple sclerosis diagnosed in 2004.  She is unsure of the type. 2. Asthma for which she only needs to use a inhaler maybe once a week. 3. Arthritis mainly in her back she states and in her knees. 4. Anxiety and depression. 5. Fibromyalgia.  PAST SURGICAL HISTORY: 1. Bilateral foot surgery. 2. C-section. 3. Hysterectomy. 4. Appendectomy. 5. Cholecystectomy.  SOCIAL HISTORY:  She smoked for about 4 years a pack a day and stopped in 2010, does not drink heavy, does not use drugs.  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  Her sister resides with her.  FAMILY HISTORY:  Positive for hypertension.  REVIEW OF SYSTEMS:  She has  had about a 40-pound weight loss in the past 3-4 weeks despite having a normal appetite.  No complaints of frequent headaches.  She has headache maybe once a week.  HEENT:  No visual changes or eye pain.  She often has sore throat, sinus trouble, and earache from her allergies.  RESPIRATORY:  Has had some dyspnea on exertion, states that she has been wheezing with exertion.  She has not had any cough.  CARDIAC:  No chest pain, palpitations.  She does have mild pedal edema.  GI: Positive for nausea, over the past 24 hours without any vomiting.  She does have episodes of nausea and vomiting maybe once a month which last about 24 hours and resolved on their own. She does not complain of any abdominal pain, diarrhea, or constipation. GU:  Apparently was having trouble urinating.  She was straight cathed in the ER and has since been urinating well.  Otherwise, no complaint of dysuria or hematuria.  Hematologically bruises easily.  Skin: No rash. MUSCULOSKELETAL:  Complaints of lower back pain and pain in both knees for which she has to take medication constantly.  Neurologically, complaints of numbness and tingling radiating from her left hip down to her  foot, this has been going on for a while.  Her neurologist is not certain if this is related to MS or her back.  Psychologically, anxiety and depression are well controlled on medication.  PHYSICAL EXAMINATION:  VITAL SIGNS:  Temperature 98.7, pulse 84, respirations 20, blood pressure 149/86, pulse ox 99% on room air. HEENT:  Pupils are equal, round, reactive to light.  Extraocular movements are intact.  Conjunctivae is pink.  No scleral icterus.  Oral mucosa is moist.  Oropharynx is clear. NECK:  Supple.  No thyromegaly, lymphadenopathy, carotid bruits. HEART:  Regular rate and rhythm.  No murmurs, rubs, or gallops. LUNGS:  Clear bilaterally.  Good inspiratory effort.  No use of accessory muscles. ABDOMEN:  Obese, soft, tender in left upper  quadrant which she states has been going on for years.  Bowel sounds positive.  Unable to assess for organomegaly. EXTREMITIES:  No cyanosis, clubbing, or edema.  Pedal pulses are positive. NEUROLOGIC:  Cranial nerves II through XII are intact.  Strength is 5/5 in all four extremities, but she does have generalized tremors with movement. PSYCHOLOGIC:  Awake, alert, oriented x3.  Mood and affect normal. SKIN:  Warm and dry.  No rash or bruising.  LABORATORY DATA:  Blood work, abnormal blood work.  Potassium was slightly low at 3.0.  UA reveals a small amount of leukocytes, many squamous epithelial cells, 0-2 wbc's and rare bacteria, specific gravity is 1.005.  CT scan of the head without contrast performed today reveals no acute abnormality.  MRI of the lumbar spine without contrast reveals mild disk bulge at L4- L5 and L5-S1.  There is no significant spinal stenosis noted.  There is some facet degenerative disease.  ASSESSMENT AND PLAN: 1. Generalized weakness, tremors, trouble urinating which has now     resolved.  I will get an MRI of her brain.  We will start her on     high dose Solu-Medrol, get PT and OT to see her in the morning.  I     have spoken with Dr. Marjory Lies with neurology who will evaluate     her. 2. Weight loss of unknown etiology.  I will get a prealbumin and     thyroid function studies and stool occults.  Her hemoglobin is     normal at 13, but looking back at her trend, it was of 14 or 15 a     year ago. 3. Asthma, currently seems to be stable despite her complaint of     wheezing earlier today.  I have ordered p.r.n. nebs.  We will     continue to follow. 4. Gastroesophageal reflux disease, question of whether or not she may     have developed ulcers despite being on a PPI.  She does take NSAIDs     daily.  However, she does not complain of any significant gastric     pain and no tenderness is noted on exam.  We will check stool     occults.  However,  further workup for her weight loss can be done     as an outpatient. 5. Obstructive sleep apnea.  We will order a CPAP for her tonight. 6. Morbid obesity.  Time on admission 60 minutes.     Calvert Cantor, M.D.     SR/MEDQ  D:  01/29/2011  T:  01/29/2011  Job:  [id number]  cc:   Lorelle Formosa, M.D.  Electronically Signed by Calvert Cantor M.D. on 02/06/2011 08:39:53 AM

## 2011-02-13 ENCOUNTER — Other Ambulatory Visit: Payer: Self-pay | Admitting: Critical Care Medicine

## 2011-02-24 NOTE — Telephone Encounter (Signed)
Dr. Delford Field, are you ok with refilling this bp med for pt?

## 2011-03-21 ENCOUNTER — Other Ambulatory Visit: Payer: Self-pay | Admitting: Adult Health

## 2011-03-23 NOTE — Telephone Encounter (Signed)
Pt needs ov for further refills.  ?

## 2011-04-03 LAB — URINALYSIS, ROUTINE W REFLEX MICROSCOPIC
Bilirubin Urine: NEGATIVE
Glucose, UA: NEGATIVE
Leukocytes, UA: NEGATIVE
Nitrite: NEGATIVE
Protein, ur: NEGATIVE
Specific Gravity, Urine: 1.036 — ABNORMAL HIGH
Urobilinogen, UA: 0.2
pH: 6

## 2011-04-03 LAB — POCT I-STAT, CHEM 8
BUN: 13
Calcium, Ion: 1.2
Chloride: 107
Creatinine, Ser: 1.1
Glucose, Bld: 101 — ABNORMAL HIGH
HCT: 49 — ABNORMAL HIGH
Hemoglobin: 16.7 — ABNORMAL HIGH
Potassium: 3.3 — ABNORMAL LOW
Sodium: 142
TCO2: 23

## 2011-04-03 LAB — URINE MICROSCOPIC-ADD ON

## 2011-04-07 LAB — GLUCOSE, CAPILLARY
Glucose-Capillary: 100 mg/dL — ABNORMAL HIGH (ref 70–99)
Glucose-Capillary: 101 mg/dL — ABNORMAL HIGH (ref 70–99)
Glucose-Capillary: 102 mg/dL — ABNORMAL HIGH (ref 70–99)
Glucose-Capillary: 102 mg/dL — ABNORMAL HIGH (ref 70–99)
Glucose-Capillary: 108 mg/dL — ABNORMAL HIGH (ref 70–99)
Glucose-Capillary: 108 mg/dL — ABNORMAL HIGH (ref 70–99)
Glucose-Capillary: 110 mg/dL — ABNORMAL HIGH (ref 70–99)
Glucose-Capillary: 111 mg/dL — ABNORMAL HIGH (ref 70–99)
Glucose-Capillary: 114 mg/dL — ABNORMAL HIGH (ref 70–99)
Glucose-Capillary: 114 mg/dL — ABNORMAL HIGH (ref 70–99)
Glucose-Capillary: 115 mg/dL — ABNORMAL HIGH (ref 70–99)
Glucose-Capillary: 116 mg/dL — ABNORMAL HIGH (ref 70–99)
Glucose-Capillary: 116 mg/dL — ABNORMAL HIGH (ref 70–99)
Glucose-Capillary: 117 mg/dL — ABNORMAL HIGH (ref 70–99)
Glucose-Capillary: 119 mg/dL — ABNORMAL HIGH (ref 70–99)
Glucose-Capillary: 123 mg/dL — ABNORMAL HIGH (ref 70–99)
Glucose-Capillary: 128 mg/dL — ABNORMAL HIGH (ref 70–99)
Glucose-Capillary: 129 mg/dL — ABNORMAL HIGH (ref 70–99)
Glucose-Capillary: 131 mg/dL — ABNORMAL HIGH (ref 70–99)
Glucose-Capillary: 135 mg/dL — ABNORMAL HIGH (ref 70–99)
Glucose-Capillary: 143 mg/dL — ABNORMAL HIGH (ref 70–99)
Glucose-Capillary: 145 mg/dL — ABNORMAL HIGH (ref 70–99)
Glucose-Capillary: 192 mg/dL — ABNORMAL HIGH (ref 70–99)
Glucose-Capillary: 99 mg/dL (ref 70–99)

## 2011-04-07 LAB — BASIC METABOLIC PANEL
BUN: 12 mg/dL (ref 6–23)
BUN: 12 mg/dL (ref 6–23)
BUN: 12 mg/dL (ref 6–23)
BUN: 15 mg/dL (ref 6–23)
CO2: 23 mEq/L (ref 19–32)
CO2: 23 mEq/L (ref 19–32)
CO2: 23 mEq/L (ref 19–32)
CO2: 26 mEq/L (ref 19–32)
Calcium: 8.2 mg/dL — ABNORMAL LOW (ref 8.4–10.5)
Calcium: 8.4 mg/dL (ref 8.4–10.5)
Calcium: 8.5 mg/dL (ref 8.4–10.5)
Calcium: 8.6 mg/dL (ref 8.4–10.5)
Chloride: 108 mEq/L (ref 96–112)
Chloride: 110 mEq/L (ref 96–112)
Chloride: 111 mEq/L (ref 96–112)
Chloride: 115 mEq/L — ABNORMAL HIGH (ref 96–112)
Creatinine, Ser: 0.62 mg/dL (ref 0.4–1.2)
Creatinine, Ser: 0.71 mg/dL (ref 0.4–1.2)
Creatinine, Ser: 0.76 mg/dL (ref 0.4–1.2)
Creatinine, Ser: 1.09 mg/dL (ref 0.4–1.2)
GFR calc Af Amer: >60 mL/min (ref >60)
GFR calc Af Amer: >60 mL/min (ref >60)
GFR calc Af Amer: >60 mL/min (ref >60)
GFR calc Af Amer: >60 mL/min (ref >60)
GFR calc non Af Amer: 54 mL/min — ABNORMAL LOW (ref >60)
GFR calc non Af Amer: >60 mL/min (ref >60)
GFR calc non Af Amer: >60 mL/min (ref >60)
GFR calc non Af Amer: >60 mL/min (ref >60)
Glucose, Bld: 115 mg/dL — ABNORMAL HIGH (ref 70–99)
Glucose, Bld: 117 mg/dL — ABNORMAL HIGH (ref 70–99)
Glucose, Bld: 130 mg/dL — ABNORMAL HIGH (ref 70–99)
Glucose, Bld: 208 mg/dL — ABNORMAL HIGH (ref 70–99)
Potassium: 3.4 mEq/L — ABNORMAL LOW (ref 3.5–5.1)
Potassium: 3.5 mEq/L (ref 3.5–5.1)
Potassium: 3.9 mEq/L (ref 3.5–5.1)
Potassium: 4.1 mEq/L (ref 3.5–5.1)
Sodium: 138 mEq/L (ref 135–145)
Sodium: 141 mEq/L (ref 135–145)
Sodium: 142 mEq/L (ref 135–145)
Sodium: 143 mEq/L (ref 135–145)

## 2011-04-07 LAB — DIFFERENTIAL
Basophils Absolute: 0.1 10*3/uL (ref 0.0–0.1)
Basophils Relative: 1 % (ref 0–1)
Eosinophils Absolute: 0.9 10*3/uL — ABNORMAL HIGH (ref 0.0–0.7)
Eosinophils Relative: 8 % — ABNORMAL HIGH (ref 0–5)
Lymphocytes Relative: 46 % (ref 12–46)
Lymphs Abs: 5.5 10*3/uL — ABNORMAL HIGH (ref 0.7–4.0)
Monocytes Absolute: 0.9 10*3/uL (ref 0.1–1.0)
Monocytes Relative: 8 % (ref 3–12)
Neutro Abs: 4.4 10*3/uL (ref 1.7–7.7)
Neutrophils Relative %: 37 % — ABNORMAL LOW (ref 43–77)

## 2011-04-07 LAB — CBC
HCT: 36.9 % (ref 36.0–46.0)
HCT: 36.9 % (ref 36.0–46.0)
HCT: 37.8 % (ref 36.0–46.0)
HCT: 38.9 % (ref 36.0–46.0)
HCT: 42.1 % (ref 36.0–46.0)
Hemoglobin: 12.5 g/dL (ref 12.0–15.0)
Hemoglobin: 12.6 g/dL (ref 12.0–15.0)
Hemoglobin: 12.6 g/dL (ref 12.0–15.0)
Hemoglobin: 13.1 g/dL (ref 12.0–15.0)
Hemoglobin: 14.4 g/dL (ref 12.0–15.0)
MCHC: 33.2 g/dL (ref 30.0–36.0)
MCHC: 33.8 g/dL (ref 30.0–36.0)
MCHC: 33.9 g/dL (ref 30.0–36.0)
MCHC: 34.1 g/dL (ref 30.0–36.0)
MCHC: 34.1 g/dL (ref 30.0–36.0)
MCV: 83.6 fL (ref 78.0–100.0)
MCV: 83.6 fL (ref 78.0–100.0)
MCV: 83.9 fL (ref 78.0–100.0)
MCV: 84.6 fL (ref 78.0–100.0)
MCV: 85.4 fL (ref 78.0–100.0)
Platelets: 178 10*3/uL (ref 150–400)
Platelets: 244 10*3/uL (ref 150–400)
Platelets: 254 10*3/uL (ref 150–400)
Platelets: 261 10*3/uL (ref 150–400)
Platelets: 276 10*3/uL (ref 150–400)
RBC: 4.32 MIL/uL (ref 3.87–5.11)
RBC: 4.42 MIL/uL (ref 3.87–5.11)
RBC: 4.5 MIL/uL (ref 3.87–5.11)
RBC: 4.65 MIL/uL (ref 3.87–5.11)
RBC: 4.98 MIL/uL (ref 3.87–5.11)
RDW: 15.1 % (ref 11.5–15.5)
RDW: 15.6 % — ABNORMAL HIGH (ref 11.5–15.5)
RDW: 15.6 % — ABNORMAL HIGH (ref 11.5–15.5)
RDW: 15.7 % — ABNORMAL HIGH (ref 11.5–15.5)
RDW: 15.8 % — ABNORMAL HIGH (ref 11.5–15.5)
WBC: 11.8 10*3/uL — ABNORMAL HIGH (ref 4.0–10.5)
WBC: 14.5 10*3/uL — ABNORMAL HIGH (ref 4.0–10.5)
WBC: 14.5 10*3/uL — ABNORMAL HIGH (ref 4.0–10.5)
WBC: 7.7 10*3/uL (ref 4.0–10.5)
WBC: 9.7 10*3/uL (ref 4.0–10.5)

## 2011-04-07 LAB — POCT I-STAT 3, ART BLOOD GAS (G3+)
Acid-Base Excess: 1 mmol/L (ref 0.0–2.0)
Acid-Base Excess: 6 mmol/L — ABNORMAL HIGH (ref 0.0–2.0)
Acid-base deficit: 3 mmol/L — ABNORMAL HIGH (ref 0.0–2.0)
Acid-base deficit: 3 mmol/L — ABNORMAL HIGH (ref 0.0–2.0)
Acid-base deficit: 5 mmol/L — ABNORMAL HIGH (ref 0.0–2.0)
Acid-base deficit: 9 mmol/L — ABNORMAL HIGH (ref 0.0–2.0)
Bicarbonate: 20.7 mEq/L (ref 20.0–24.0)
Bicarbonate: 21.3 mEq/L (ref 20.0–24.0)
Bicarbonate: 22.3 mEq/L (ref 20.0–24.0)
Bicarbonate: 25.5 mEq/L — ABNORMAL HIGH (ref 20.0–24.0)
Bicarbonate: 29.6 mEq/L — ABNORMAL HIGH (ref 20.0–24.0)
Bicarbonate: 33.5 mEq/L — ABNORMAL HIGH (ref 20.0–24.0)
O2 Saturation: 100 %
O2 Saturation: 100 %
O2 Saturation: 34 %
O2 Saturation: 96 %
O2 Saturation: 96 %
O2 Saturation: 98 %
Patient temperature: 97.4
Patient temperature: 97.4
Patient temperature: 97.8
Patient temperature: 98.6
Patient temperature: 98.6
TCO2: 22 mmol/L (ref 0–100)
TCO2: 22 mmol/L (ref 0–100)
TCO2: 23 mmol/L (ref 0–100)
TCO2: 29 mmol/L (ref 0–100)
TCO2: 31 mmol/L (ref 0–100)
TCO2: 35 mmol/L (ref 0–100)
pCO2 arterial: 109.4 mmHg (ref 35.0–45.0)
pCO2 arterial: 32.5 mmHg — ABNORMAL LOW (ref 35.0–45.0)
pCO2 arterial: 38.6 mmHg (ref 35.0–45.0)
pCO2 arterial: 38.9 mmHg (ref 35.0–45.0)
pCO2 arterial: 62.4 mmHg (ref 35.0–45.0)
pCO2 arterial: 62.8 mmHg (ref 35.0–45.0)
pH, Arterial: 6.975 — CL (ref 7.350–7.400)
pH, Arterial: 7.281 — ABNORMAL LOW (ref 7.350–7.400)
pH, Arterial: 7.331 — ABNORMAL LOW (ref 7.350–7.400)
pH, Arterial: 7.339 — ABNORMAL LOW (ref 7.350–7.400)
pH, Arterial: 7.367 (ref 7.350–7.400)
pH, Arterial: 7.422 — ABNORMAL HIGH (ref 7.350–7.400)
pO2, Arterial: 189 mmHg — ABNORMAL HIGH (ref 80.0–100.0)
pO2, Arterial: 33 mmHg — CL (ref 80.0–100.0)
pO2, Arterial: 610 mmHg — ABNORMAL HIGH (ref 80.0–100.0)
pO2, Arterial: 83 mmHg (ref 80.0–100.0)
pO2, Arterial: 86 mmHg (ref 80.0–100.0)
pO2, Arterial: 93 mmHg (ref 80.0–100.0)

## 2011-04-07 LAB — TSH: TSH: 0.9 u[IU]/mL (ref 0.350–4.500)

## 2011-04-07 LAB — RAPID URINE DRUG SCREEN, HOSP PERFORMED
Amphetamines: NOT DETECTED
Barbiturates: NOT DETECTED
Benzodiazepines: POSITIVE — AB
Cocaine: NOT DETECTED
Opiates: POSITIVE — AB
Tetrahydrocannabinol: NOT DETECTED

## 2011-04-07 LAB — URINALYSIS, ROUTINE W REFLEX MICROSCOPIC
Bilirubin Urine: NEGATIVE
Glucose, UA: NEGATIVE mg/dL
Ketones, ur: NEGATIVE mg/dL
Leukocytes, UA: NEGATIVE
Nitrite: NEGATIVE
Protein, ur: 30 mg/dL — AB
Specific Gravity, Urine: 1.029 (ref 1.005–1.030)
Urobilinogen, UA: 0.2 mg/dL (ref 0.0–1.0)
pH: 6 (ref 5.0–8.0)

## 2011-04-07 LAB — COMPREHENSIVE METABOLIC PANEL
ALT: 30 U/L (ref 0–35)
AST: 34 U/L (ref 0–37)
Albumin: 3.7 g/dL (ref 3.5–5.2)
Alkaline Phosphatase: 63 U/L (ref 39–117)
BUN: 15 mg/dL (ref 6–23)
CO2: 18 mEq/L — ABNORMAL LOW (ref 19–32)
Calcium: 8.3 mg/dL — ABNORMAL LOW (ref 8.4–10.5)
Chloride: 105 mEq/L (ref 96–112)
Creatinine, Ser: 1.13 mg/dL (ref 0.4–1.2)
GFR calc Af Amer: >60 mL/min (ref >60)
GFR calc non Af Amer: 52 mL/min — ABNORMAL LOW (ref >60)
Glucose, Bld: 214 mg/dL — ABNORMAL HIGH (ref 70–99)
Potassium: 3.8 mEq/L (ref 3.5–5.1)
Sodium: 136 mEq/L (ref 135–145)
Total Bilirubin: 0.3 mg/dL (ref 0.3–1.2)
Total Protein: 6.9 g/dL (ref 6.0–8.3)

## 2011-04-07 LAB — B-NATRIURETIC PEPTIDE (CONVERTED LAB): Pro B Natriuretic peptide (BNP): <30 pg/mL (ref 0.0–100.0)

## 2011-04-07 LAB — POCT CARDIAC MARKERS
CKMB, poc: 1.4 ng/mL (ref 1.0–8.0)
Myoglobin, poc: 299 ng/mL (ref 12–200)
Troponin i, poc: <0.05 ng/mL (ref 0.00–0.09)

## 2011-04-07 LAB — PHOSPHORUS: Phosphorus: 2.8 mg/dL (ref 2.3–4.6)

## 2011-04-07 LAB — URINE MICROSCOPIC-ADD ON

## 2011-04-07 LAB — MAGNESIUM: Magnesium: 2.8 mg/dL — ABNORMAL HIGH (ref 1.5–2.5)

## 2011-04-10 LAB — BASIC METABOLIC PANEL
BUN: 13 mg/dL (ref 6–23)
CO2: 27 mEq/L (ref 19–32)
Calcium: 8.9 mg/dL (ref 8.4–10.5)
Chloride: 106 mEq/L (ref 96–112)
Creatinine, Ser: 0.9 mg/dL (ref 0.4–1.2)
GFR calc Af Amer: >60 mL/min (ref >60)
GFR calc non Af Amer: >60 mL/min (ref >60)
Glucose, Bld: 84 mg/dL (ref 70–99)
Potassium: 3.2 mEq/L — ABNORMAL LOW (ref 3.5–5.1)
Sodium: 143 mEq/L (ref 135–145)

## 2011-04-10 LAB — CBC
HCT: 40.6 % (ref 36.0–46.0)
Hemoglobin: 14 g/dL (ref 12.0–15.0)
MCHC: 34.5 g/dL (ref 30.0–36.0)
MCV: 83.1 fL (ref 78.0–100.0)
Platelets: 247 10*3/uL (ref 150–400)
RBC: 4.89 MIL/uL (ref 3.87–5.11)
RDW: 15.5 % (ref 11.5–15.5)
WBC: 10.8 10*3/uL — ABNORMAL HIGH (ref 4.0–10.5)

## 2011-04-10 LAB — PHOSPHORUS: Phosphorus: 3.2 mg/dL (ref 2.3–4.6)

## 2011-04-10 LAB — MAGNESIUM: Magnesium: 2.5 mg/dL (ref 1.5–2.5)

## 2011-04-10 LAB — GLUCOSE, CAPILLARY
Glucose-Capillary: 108 mg/dL — ABNORMAL HIGH (ref 70–99)
Glucose-Capillary: 125 mg/dL — ABNORMAL HIGH (ref 70–99)
Glucose-Capillary: 138 mg/dL — ABNORMAL HIGH (ref 70–99)
Glucose-Capillary: 96 mg/dL (ref 70–99)
Glucose-Capillary: 97 mg/dL (ref 70–99)

## 2011-04-15 LAB — WET PREP, GENITAL
Clue Cells Wet Prep HPF POC: NONE SEEN
Yeast Wet Prep HPF POC: NONE SEEN

## 2011-04-15 LAB — URINE CULTURE
Colony Count: NO GROWTH
Culture: NO GROWTH

## 2011-04-15 LAB — CBC
HCT: 42.3
Hemoglobin: 14.2
MCHC: 33.6
MCV: 87.3
Platelets: 356
RBC: 4.84
RDW: 14.3 — ABNORMAL HIGH
WBC: 4.4

## 2011-04-15 LAB — URINALYSIS, ROUTINE W REFLEX MICROSCOPIC
Bilirubin Urine: NEGATIVE
Glucose, UA: NEGATIVE
Hgb urine dipstick: NEGATIVE
Ketones, ur: NEGATIVE
Nitrite: NEGATIVE
Protein, ur: 30 — AB
Specific Gravity, Urine: 1.015
Urobilinogen, UA: 2 — ABNORMAL HIGH
pH: 8.5 — ABNORMAL HIGH

## 2011-04-15 LAB — URINE MICROSCOPIC-ADD ON

## 2011-04-15 LAB — GC/CHLAMYDIA PROBE AMP, GENITAL
Chlamydia, DNA Probe: NEGATIVE
GC Probe Amp, Genital: NEGATIVE

## 2011-04-20 LAB — DIFFERENTIAL
Basophils Absolute: 0.1
Basophils Relative: 1
Eosinophils Absolute: 0.2
Eosinophils Relative: 4
Lymphocytes Relative: 28
Lymphs Abs: 1.6
Monocytes Absolute: 0.4
Monocytes Relative: 8
Neutro Abs: 3.3
Neutrophils Relative %: 59

## 2011-04-20 LAB — COMPREHENSIVE METABOLIC PANEL
ALT: 21
AST: 19
Albumin: 4
Alkaline Phosphatase: 52
BUN: 15
CO2: 24
Calcium: 9
Chloride: 106
Creatinine, Ser: 0.86
GFR calc Af Amer: >60
GFR calc non Af Amer: >60
Glucose, Bld: 102 — ABNORMAL HIGH
Potassium: 4
Sodium: 138
Total Bilirubin: 0.5
Total Protein: 7.7

## 2011-04-20 LAB — CBC
HCT: 43
Hemoglobin: 14.5
MCHC: 33.7
MCV: 87.7
Platelets: 347
RBC: 4.9
RDW: 14.4 — ABNORMAL HIGH
WBC: 5.6

## 2011-04-20 LAB — PROTIME-INR
INR: 0.9
Prothrombin Time: 12.1

## 2011-04-20 LAB — B-NATRIURETIC PEPTIDE (CONVERTED LAB): Pro B Natriuretic peptide (BNP): <30

## 2011-04-20 LAB — POCT CARDIAC MARKERS
CKMB, poc: 1.5
Myoglobin, poc: 115
Operator id: 272551
Troponin i, poc: <0.05

## 2011-04-20 LAB — APTT: aPTT: 33

## 2011-04-22 LAB — COMPREHENSIVE METABOLIC PANEL
ALT: 21
AST: 13
Albumin: 3.8
Alkaline Phosphatase: 38 — ABNORMAL LOW
BUN: 10
CO2: 29
Calcium: 9.2
Chloride: 105
Creatinine, Ser: 0.71
GFR calc Af Amer: >60
GFR calc non Af Amer: >60
Glucose, Bld: 92
Potassium: 3.1 — ABNORMAL LOW
Sodium: 141
Total Bilirubin: 0.3
Total Protein: 7.3

## 2011-04-22 LAB — CBC
HCT: 42
Hemoglobin: 14
MCHC: 33.3
MCV: 86.6
Platelets: 357
RBC: 4.84
RDW: 14.3 — ABNORMAL HIGH
WBC: 5.2

## 2011-04-22 LAB — PREGNANCY, URINE: Preg Test, Ur: NEGATIVE

## 2011-05-05 ENCOUNTER — Other Ambulatory Visit: Payer: Self-pay | Admitting: Critical Care Medicine

## 2011-05-07 NOTE — Telephone Encounter (Signed)
Dr. Delford Field, are you ok with sending refills for the toprol xl 25mg  for pt?  Please advise.  Thanks!

## 2011-06-19 ENCOUNTER — Other Ambulatory Visit: Payer: Self-pay | Admitting: *Deleted

## 2011-06-19 MED ORDER — ALBUTEROL SULFATE HFA 108 (90 BASE) MCG/ACT IN AERS: 2.0000 | INHALATION_SPRAY | RESPIRATORY_TRACT | Status: DC | PRN

## 2011-06-19 NOTE — Telephone Encounter (Signed)
Received fax from [facility] on [address] for a refill on pt's proair. Jerolyn Shin CMA okay the refill and faxed form back to them. i have updated medication in EPIC.

## 2011-06-23 ENCOUNTER — Other Ambulatory Visit: Payer: Self-pay | Admitting: Allergy

## 2011-07-14 ENCOUNTER — Other Ambulatory Visit: Payer: Self-pay | Admitting: Critical Care Medicine

## 2011-08-10 ENCOUNTER — Other Ambulatory Visit: Payer: Self-pay | Admitting: Psychiatry

## 2011-08-10 DIAGNOSIS — M545 Low back pain, unspecified: Secondary | ICD-10-CM

## 2011-08-14 ENCOUNTER — Ambulatory Visit
Admission: RE | Admit: 2011-08-14 | Discharge: 2011-08-14 | Disposition: A | Discharge status: Home / Self Care | Payer: Medicare Other | Source: Ambulatory Visit | Attending: Psychiatry | Admitting: Psychiatry

## 2011-08-14 DIAGNOSIS — M545 Low back pain, unspecified: Secondary | ICD-10-CM

## 2011-08-17 ENCOUNTER — Telehealth: Payer: Self-pay | Admitting: Internal Medicine

## 2011-08-17 NOTE — Telephone Encounter (Signed)
LMOM for Shelly at [facility] oxygen TCB

## 2011-08-18 NOTE — Telephone Encounter (Signed)
LMOM for Shelly TCB

## 2011-08-19 NOTE — Telephone Encounter (Signed)
LMOMTCB for [employer].

## 2011-08-20 NOTE — Telephone Encounter (Signed)
Lmomtcb for [name] advising to please call office back and leave a new message with the triage nurse if this has not been taken care of - per protocol will sign off on this message.

## 2011-08-21 ENCOUNTER — Telehealth: Payer: Self-pay | Admitting: Critical Care Medicine

## 2011-08-21 DIAGNOSIS — J45909 Unspecified asthma, uncomplicated: Secondary | ICD-10-CM

## 2011-08-21 NOTE — Telephone Encounter (Signed)
I am ok with this change

## 2011-08-21 NOTE — Telephone Encounter (Signed)
Please advise pcc, thanks

## 2011-08-21 NOTE — Telephone Encounter (Signed)
I spoke with pt to see if she was aware of this and she stated no one had contacted her about this. i advised her per [facility]ly that [facility] no longer in network with her insurance and will have to be changed to [facility] bc they in network w/ her insurance. Pt was fine with this if Dr. Delford Field was. Dr. Delford Field,  Are you okay with this? Please advise thanks

## 2011-09-06 ENCOUNTER — Other Ambulatory Visit: Payer: Self-pay | Admitting: Critical Care Medicine

## 2011-09-16 ENCOUNTER — Telehealth: Payer: Self-pay | Admitting: Critical Care Medicine

## 2011-09-16 NOTE — Telephone Encounter (Signed)
[facility] aware [facility] does not manage pt's Metoprolol and this request should go to her PCP. Mia with [facility] verbalized understanding.Marland Kitchen

## 2011-09-16 NOTE — Telephone Encounter (Signed)
[name] [address] would like to know the status of this refill & can be reached at [phone].  [name]

## 2011-11-04 ENCOUNTER — Emergency Department (HOSPITAL_COMMUNITY)
Admission: EM | Admit: 2011-11-04 | Discharge: 2011-11-04 | Disposition: A | Discharge status: Home / Self Care | Payer: Medicare Other | Attending: Emergency Medicine | Admitting: Emergency Medicine

## 2011-11-04 ENCOUNTER — Encounter (HOSPITAL_COMMUNITY): Payer: Self-pay | Admitting: Emergency Medicine

## 2011-11-04 ENCOUNTER — Emergency Department (HOSPITAL_COMMUNITY): Payer: Medicare Other

## 2011-11-04 DIAGNOSIS — Z79899 Other long term (current) drug therapy: Secondary | ICD-10-CM | POA: Insufficient documentation

## 2011-11-04 DIAGNOSIS — J45901 Unspecified asthma with (acute) exacerbation: Secondary | ICD-10-CM | POA: Insufficient documentation

## 2011-11-04 DIAGNOSIS — Z87891 Personal history of nicotine dependence: Secondary | ICD-10-CM | POA: Insufficient documentation

## 2011-11-04 DIAGNOSIS — G35 Multiple sclerosis: Secondary | ICD-10-CM | POA: Insufficient documentation

## 2011-11-04 DIAGNOSIS — IMO0001 Reserved for inherently not codable concepts without codable children: Secondary | ICD-10-CM | POA: Insufficient documentation

## 2011-11-04 DIAGNOSIS — R0602 Shortness of breath: Secondary | ICD-10-CM | POA: Insufficient documentation

## 2011-11-04 HISTORY — DX: Multiple sclerosis: G35

## 2011-11-04 LAB — BASIC METABOLIC PANEL
BUN: 10 mg/dL (ref 6–23)
CO2: 28 mEq/L (ref 19–32)
Calcium: 9.1 mg/dL (ref 8.4–10.5)
Chloride: 102 mEq/L (ref 96–112)
Creatinine, Ser: 0.81 mg/dL (ref 0.50–1.10)
GFR calc Af Amer: >90 mL/min (ref >90)
GFR calc non Af Amer: 84 mL/min — ABNORMAL LOW (ref >90)
Glucose, Bld: 85 mg/dL (ref 70–99)
Potassium: 3.8 mEq/L (ref 3.5–5.1)
Sodium: 139 mEq/L (ref 135–145)

## 2011-11-04 LAB — DIFFERENTIAL
Basophils Absolute: 0 10*3/uL (ref 0.0–0.1)
Basophils Relative: 1 % (ref 0–1)
Eosinophils Absolute: 0.4 10*3/uL (ref 0.0–0.7)
Eosinophils Relative: 8 % — ABNORMAL HIGH (ref 0–5)
Lymphocytes Relative: 50 % — ABNORMAL HIGH (ref 12–46)
Lymphs Abs: 2.7 10*3/uL (ref 0.7–4.0)
Monocytes Absolute: 0.6 10*3/uL (ref 0.1–1.0)
Monocytes Relative: 12 % (ref 3–12)
Neutro Abs: 1.6 10*3/uL — ABNORMAL LOW (ref 1.7–7.7)
Neutrophils Relative %: 30 % — ABNORMAL LOW (ref 43–77)

## 2011-11-04 LAB — CBC
HCT: 37.9 % (ref 36.0–46.0)
Hemoglobin: 12.6 g/dL (ref 12.0–15.0)
MCH: 28.3 pg (ref 26.0–34.0)
MCHC: 33.2 g/dL (ref 30.0–36.0)
MCV: 85.2 fL (ref 78.0–100.0)
Platelets: 282 10*3/uL (ref 150–400)
RBC: 4.45 MIL/uL (ref 3.87–5.11)
RDW: 14.2 % (ref 11.5–15.5)
WBC: 5.3 10*3/uL (ref 4.0–10.5)

## 2011-11-04 MED ORDER — IPRATROPIUM-ALBUTEROL 18-103 MCG/ACT IN AERO
2.0000 | INHALATION_SPRAY | RESPIRATORY_TRACT | Status: DC
  Administered 2011-11-04: 2 via RESPIRATORY_TRACT
  Filled 2011-11-04: qty 14.7

## 2011-11-04 MED ORDER — PREDNISONE 20 MG PO TABS
60.0000 mg | ORAL_TABLET | Freq: Once | ORAL | Status: AC
  Administered 2011-11-04: 60 mg via ORAL
  Filled 2011-11-04: qty 3

## 2011-11-04 MED ORDER — PREDNISONE 20 MG PO TABS: 40.0000 mg | ORAL_TABLET | Freq: Every day | ORAL | Status: AC

## 2011-11-04 NOTE — ED Notes (Signed)
Pt states that she has hx of asthma.  Pt reports having shortness of breath that started last night approx 8pm; used albuterol inhaler with no improvement as well as her advair diskus.  Pt not in acute distress; not tachypneic, pt 97% on room air.

## 2011-11-04 NOTE — ED Notes (Signed)
Pt reports having an asthma flair up last night and again this am.  No wheezing noted.  Pt denies respiratory distress at this time.  the patient denies fever, cough.  No distress noted.  Pt last used her albuterol inhaler 1 hour PTA

## 2011-11-04 NOTE — ED Notes (Signed)
Pharmacy notified for combivent inhaler

## 2011-11-04 NOTE — Discharge Instructions (Signed)
Asthma Attack Prevention HOW CAN ASTHMA BE PREVENTED? Currently, there is no way to prevent asthma from starting. However, you can take steps to control the disease and prevent its symptoms after you have been diagnosed. Learn about your asthma and how to control it. Take an active role to control your asthma by working with your caregiver to create and follow an asthma action plan. An asthma action plan guides you in taking your medicines properly, avoiding factors that make your asthma worse, tracking your level of asthma control, responding to worsening asthma, and seeking emergency care when needed. To track your asthma, keep records of your symptoms, check your peak flow number using a peak flow meter (handheld device that shows how well air moves out of your lungs), and get regular asthma checkups.  Other ways to prevent asthma attacks include:  Use medicines as your caregiver directs.   Identify and avoid things that make your asthma worse (as much as you can).   Keep track of your asthma symptoms and level of control.   Get regular checkups for your asthma.   With your caregiver, write a detailed plan for taking medicines and managing an asthma attack. Then be sure to follow your action plan. Asthma is an ongoing condition that needs regular monitoring and treatment.   Identify and avoid asthma triggers. A number of outdoor allergens and irritants (pollen, mold, cold air, air pollution) can trigger asthma attacks. Find out what causes or makes your asthma worse, and take steps to avoid those triggers (see below).   Monitor your breathing. Learn to recognize warning signs of an attack, such as slight coughing, wheezing or shortness of breath. However, your lung function may already decrease before you notice any signs or symptoms, so regularly measure and record your peak airflow with a home peak flow meter.   Identify and treat attacks early. If you act quickly, you're less likely to have  a severe attack. You will also need less medicine to control your symptoms. When your peak flow measurements decrease and alert you to an upcoming attack, take your medicine as instructed, and immediately stop any activity that may have triggered the attack. If your symptoms do not improve, get medical help.   Pay attention to increasing quick-relief inhaler use. If you find yourself relying on your quick-relief inhaler (such as albuterol), your asthma is not under control. See your caregiver about adjusting your treatment.  IDENTIFY AND CONTROL FACTORS THAT MAKE YOUR ASTHMA WORSE A number of common things can set off or make your asthma symptoms worse (asthma triggers). Keep track of your asthma symptoms for several weeks, detailing all the environmental and emotional factors that are linked with your asthma. When you have an asthma attack, go back to your asthma diary to see which factor, or combination of factors, might have contributed to it. Once you know what these factors are, you can take steps to control many of them.  Allergies: If you have allergies and asthma, it is important to take asthma prevention steps at home. Asthma attacks (worsening of asthma symptoms) can be triggered by allergies, which can cause temporary increased inflammation of your airways. Minimizing contact with the substance to which you are allergic will help prevent an asthma attack. Animal Dander:   Some people are allergic to the flakes of skin or dried saliva from animals with fur or feathers. Keep these pets out of your home.   If you can't keep a pet outdoors, keep the   pet out of your bedroom and other sleeping areas at all times, and keep the door closed.   Remove carpets and furniture covered with cloth from your home. If that is not possible, keep the pet away from fabric-covered furniture and carpets.  Dust Mites:  Many people with asthma are allergic to dust mites. Dust mites are tiny bugs that are found in  every home, in mattresses, pillows, carpets, fabric-covered furniture, bedcovers, clothes, stuffed toys, fabric, and other fabric-covered items.   Cover your mattress in a special dust-proof cover.   Cover your pillow in a special dust-proof cover, or wash the pillow each week in hot water. Water must be hotter than 130 F to kill dust mites. Cold or warm water used with detergent and bleach can also be effective.   Wash the sheets and blankets on your bed each week in hot water.   Try not to sleep or lie on cloth-covered cushions.   Call ahead when traveling and ask for a smoke-free hotel room. Bring your own bedding and pillows, in case the hotel only supplies feather pillows and down comforters, which may contain dust mites and cause asthma symptoms.   Remove carpets from your bedroom and those laid on concrete, if you can.   Keep stuffed toys out of the bed, or wash the toys weekly in hot water or cooler water with detergent and bleach.  Cockroaches:  Many people with asthma are allergic to the droppings and remains of cockroaches.   Keep food and garbage in closed containers. Never leave food out.   Use poison baits, traps, powders, gels, or paste (for example, boric acid).   If a spray is used to kill cockroaches, stay out of the room until the odor goes away.  Indoor Mold:  Fix leaky faucets, pipes, or other sources of water that have mold around them.   Clean moldy surfaces with a cleaner that has bleach in it.  Pollen and Outdoor Mold:  When pollen or mold spore counts are high, try to keep your windows closed.   Stay indoors with windows closed from late morning to afternoon, if you can. Pollen and some mold spore counts are highest at that time.   Ask your caregiver whether you need to take or increase anti-inflammatory medicine before your allergy season starts.  Irritants:   Tobacco smoke is an irritant. If you smoke, ask your caregiver how you can quit. Ask family  members to quit smoking, too. Do not allow smoking in your home or car.   If possible, do not use a wood-burning stove, kerosene heater, or fireplace. Minimize exposure to all sources of smoke, including incense, candles, fires, and fireworks.   Try to stay away from strong odors and sprays, such as perfume, talcum powder, hair spray, and paints.   Decrease humidity in your home and use an indoor air cleaning device. Reduce indoor humidity to below 60 percent. Dehumidifiers or central air conditioners can do this.   Try to have someone else vacuum for you once or twice a week, if you can. Stay out of rooms while they are being vacuumed and for a short while afterward.   If you vacuum, use a dust mask from a hardware store, a double-layered or microfilter vacuum cleaner bag, or a vacuum cleaner with a HEPA filter.   Sulfites in foods and beverages can be irritants. Do not drink beer or wine, or eat dried fruit, processed potatoes, or shrimp if they cause asthma   wine, or eat dried fruit, processed potatoes, or shrimp if they cause asthma symptoms.   Cold air can trigger an asthma attack. Cover your nose and mouth with a scarf on cold or windy days.   Several health conditions can make asthma more difficult to manage, including runny nose, sinus infections, reflux disease, psychological stress, and sleep apnea. Your caregiver will treat these conditions, as well.   Avoid close contact with people who have a cold or the flu, since your asthma symptoms may get worse if you catch the infection from them. Wash your hands thoroughly after touching items that may have been handled by people with a respiratory infection.   Get a flu shot every year to protect against the flu virus, which often makes asthma worse for days or weeks. Also get a pneumonia shot once every five to 10 years.  Drugs:   Aspirin and other painkillers can cause asthma attacks. 10% to 20% of people with asthma have sensitivity to aspirin or a group of painkillers called non-steroidal anti-inflammatory drugs (NSAIDS), such as ibuprofen  and naproxen. These drugs are used to treat pain and reduce fevers. Asthma attacks caused by any of these medicines can be severe and even fatal. These drugs must be avoided in people who have known aspirin sensitive asthma. Products with acetaminophen are considered safe for people who have asthma. It is important that people with aspirin sensitivity read labels of all over-the-counter drugs used to treat pain, colds, coughs, and fever.   Beta blockers and ACE inhibitors are other drugs which you should discuss with your caregiver, in relation to your asthma.  ALLERGY SKIN TESTING   Ask your asthma caregiver about allergy skin testing or blood testing (RAST test) to identify the allergens to which you are sensitive. If you are found to have allergies, allergy shots (immunotherapy) for asthma may help prevent future allergies and asthma. With allergy shots, small doses of allergens (substances to which you are allergic) are injected under your skin on a regular schedule. Over a period of time, your body may become used to the allergen and less responsive with asthma symptoms. You can also take measures to minimize your exposure to those allergens.  EXERCISE   If you have exercise-induced asthma, or are planning vigorous exercise, or exercise in cold, humid, or dry environments, prevent exercise-induced asthma by following your caregiver's advice regarding asthma treatment before exercising.  Document Released: 06/10/2009 Document Revised: 06/11/2011 Document Reviewed: 06/10/2009  [employer] Patient Information 2012 [employer]      Asthma, Adult  Asthma is caused by narrowing of the air passages in the lungs. It may be triggered by pollen, dust, animal dander, molds, some foods, respiratory infections, exposure to smoke, exercise, emotional stress or other allergens (things that cause allergic reactions or allergies). Repeat attacks are common.  HOME CARE INSTRUCTIONS    Use prescription medications as ordered  by your caregiver.   Avoid pollen, dust, animal dander, molds, smoke and other things that cause attacks at home and at work.   You may have fewer attacks if you decrease dust in your home. Electrostatic air cleaners may help.   It may help to replace your pillows or mattress with materials less likely to cause allergies.   Talk to your caregiver about an action plan for managing asthma attacks at home, including, the use of a peak flow meter which measures the severity of your asthma attack. An action plan can help minimize or stop the attack   without having to seek medical care.   If you are not on a fluid restriction, drink 8 to 10 glasses of water each day.   Always have a plan prepared for seeking medical attention, including, calling your physician, accessing local emergency care, and calling 911 (in the [place] for a severe attack.   Discuss possible exercise routines with your caregiver.   If animal dander is the cause of asthma, you may need to get rid of pets.  SEEK MEDICAL CARE IF:    You have wheezing and shortness of breath even if taking medicine to prevent attacks.   You have muscle aches, chest pain or thickening of sputum.   Your sputum changes from clear or white to yellow, green, gray, or bloody.   You have any problems that may be related to the medicine you are taking (such as a rash, itching, swelling or trouble breathing).  SEEK IMMEDIATE MEDICAL CARE IF:    Your usual medicines do not stop your wheezing or there is increased coughing and/or shortness of breath.   You have increased difficulty breathing.   You have a fever.  MAKE SURE YOU:    Understand these instructions.   Will watch your condition.   Will get help right away if you are not doing well or get worse.  Document Released: 06/22/2005 Document Revised: 06/11/2011 Document Reviewed: 02/08/2008  [employer] Patient Information 2012 [employer]

## 2011-11-04 NOTE — ED Provider Notes (Signed)
History     CSN: [id number]  Arrival date & time 11/04/11  1717   First MD Initiated Contact with Patient 11/04/11 1823      Chief Complaint  Patient presents with  . Shortness of Breath  . Asthma    (Consider location/radiation/quality/duration/timing/severity/associated sxs/prior treatment) HPI history of present illness chief complaint asthma attack. Patient arrived by private vehicle. History provided by patient. No language barriers identified. Information not limited. Onset of symptoms 2 days ago. Location of symptoms development at home. Symptoms improved with albuterol inhaler. The quality radiation in severity not pertinent to the chief complaint. Timing of shortness of breath is constant. Duration is 2 days. Context-patient is unable to identify a precipitant of her asthma attack at this time. For associated signs and symptoms was referred to the review of systems. Treatment tried prior arrival include albuterol inhaler with improvement in symptoms. No recent medical care. Regarding patient's social history patient does not smoke. Please refer to the nurse's notes. I have reviewed the patient's past medical, past surgical, meds social history, as well as medications and allergies.  Past Medical History  Diagnosis Date  . Anemia   . Anxiety state, unspecified   . Backache, unspecified   . Depressive disorder, not elsewhere classified   . Obstructive sleep apnea (adult) (pediatric)   . Esophageal reflux   . Other abnormal glucose   . Fibromyalgia   . Arthropathy, unspecified, site unspecified   . MS (multiple sclerosis)     Past Surgical History  Procedure Date  . Appendectomy   . Cholecystectomy   . Tubal ligation   . Vaginal hysterectomy   . Hernia repair     Family History  Problem Relation Age of Onset  . Ovarian cancer Mother   . Heart disease Father     History  Substance Use Topics  . Smoking status: Former Smoker -- 1.0 packs/day for 5 years    Types:  Cigarettes    Quit date: 07/07/2007  . Smokeless tobacco: Never Used  . Alcohol Use: No    OB History    Grav Para Term Preterm Abortions TAB SAB Ect Mult Living                  Review of Systems  Constitutional: Negative for fever and chills.  HENT: Negative for trouble swallowing, neck pain and neck stiffness.   Eyes: Negative for pain, discharge and itching.  Respiratory: Positive for shortness of breath and wheezing. Negative for cough, chest tightness and stridor.   Cardiovascular: Negative for chest pain, palpitations and leg swelling.  Gastrointestinal: Negative for nausea, vomiting, abdominal pain, diarrhea, constipation and blood in stool.  Genitourinary: Negative for dysuria, urgency, frequency, hematuria, flank pain, decreased urine volume, difficulty urinating and pelvic pain.  Musculoskeletal: Negative for back pain and joint swelling.  Skin: Negative for rash and wound.  Neurological: Negative for dizziness, tremors, seizures, syncope, facial asymmetry, speech difficulty, weakness, light-headedness, numbness and headaches.  Hematological: Negative for adenopathy. Does not bruise/bleed easily.  Psychiatric/Behavioral: Negative for confusion and decreased concentration.    Allergies  Amitriptyline hcl; Butorphanol tartrate; Iodine; Latex; Penicillins; and Shellfish-derived products  Home Medications   Current Outpatient Rx  Name Route Sig Dispense Refill  . ALBUTEROL SULFATE HFA 108 (90 BASE) MCG/ACT IN AERS Inhalation Inhale 2 puffs into the lungs every 4 (four) hours as needed for wheezing. 1 Inhaler 0  . ALPRAZOLAM 0.5 MG PO TABS Oral Take 0.5 mg by mouth 3 (three) times  daily.     . ASENAPINE MALEATE 5 MG SL SUBL Sublingual Place 1 mg under the tongue at bedtime.      . BUPROPION HCL ER (XL) 300 MG PO TB24 Oral Take 300 mg by mouth daily.      . CYCLOBENZAPRINE HCL 10 MG PO TABS Oral Take 10 mg by mouth 3 (three) times daily as needed. For muscle pain    .  DULOXETINE HCL 60 MG PO CPEP Oral Take 60 mg by mouth daily.      Marland Kitchen FAMOTIDINE 20 MG PO TABS Oral Take 20 mg by mouth 2 (two) times daily.      Marland Kitchen FLUTICASONE-SALMETEROL 500-50 MCG/DOSE IN AEPB Inhalation Inhale 1 puff into the lungs every 12 (twelve) hours.      Marland Kitchen GABAPENTIN 600 MG PO TABS Oral Take 600 mg by mouth 3 (three) times daily.      Marland Kitchen LAMOTRIGINE 100 MG PO TABS  Take 2 at bedtime      . METOPROLOL SUCCINATE ER 50 MG PO TB24 Oral Take 50 mg by mouth daily. Take with or immediately following a meal.    . MORPHINE SULFATE ER 30 MG PO TB12 Oral Take 30 mg by mouth 2 (two) times daily.     Marland Kitchen NABUMETONE 500 MG PO TABS Oral Take 500 mg by mouth 2 (two) times daily as needed.     Marland Kitchen ZOLPIDEM TARTRATE 10 MG PO TABS Oral Take 5 mg by mouth at bedtime as needed.     Marland Kitchen EPINEPHRINE 0.3 MG/0.3ML IJ DEVI Intramuscular Inject 0.3 mg into the muscle once.      Marland Kitchen ZALEPLON 10 MG PO CAPS Oral Take 10 mg by mouth at bedtime as needed.       BP 119/66  Pulse 77  Temp(Src) 98.2 F (36.8 C) (Oral)  Resp 20  SpO2 96%  Physical Exam  Constitutional: She is oriented to person, place, and time. She appears well-developed and well-nourished. No distress.  HENT:  Head: Normocephalic and atraumatic.  Eyes: Conjunctivae are normal. Right eye exhibits no discharge. Left eye exhibits no discharge. No scleral icterus.  Neck: Normal range of motion. Neck supple.  Cardiovascular: Normal rate, regular rhythm, normal heart sounds and intact distal pulses.   No murmur heard. Pulmonary/Chest: Effort normal. No accessory muscle usage. Not tachypneic. No respiratory distress. She has decreased breath sounds in the right upper field, the right middle field, the right lower field, the left upper field, the left middle field and the left lower field. She has no wheezes. She has no rhonchi. She has no rales.  Abdominal: Soft. Bowel sounds are normal. She exhibits no distension. There is no tenderness.  Musculoskeletal:  Normal range of motion. She exhibits no tenderness.  Neurological: She is alert and oriented to person, place, and time.  Skin: Skin is warm and dry. She is not diaphoretic.  Psychiatric: She has a normal mood and affect.    ED Course  Procedures (including critical care time)  Labs Reviewed  DIFFERENTIAL - Abnormal; Notable for the following:    Neutrophils Relative 30 (*)    Neutro Abs 1.6 (*)    Lymphocytes Relative 50 (*)    Eosinophils Relative 8 (*)    All other components within normal limits  BASIC METABOLIC PANEL - Abnormal; Notable for the following:    GFR calc non Af Amer 84 (*)    All other components within normal limits  CBC   Dg Chest 2  View  11/04/2011  *RADIOLOGY REPORT*  Clinical Data: Shortness of breath, asthma, chest pain  CHEST - 2 VIEW  Comparison: 09/21/2010; 04/25/2010; 09/26/2009; chest CT - 09/18/2010  Findings: Unchanged cardiac silhouette and mediastinal contours. There is persistent mild elevation of the right hemidiaphragm.  No focal parenchymal opacities.  No pleural effusion or pneumothorax. Unchanged bones.  IMPRESSION: No acute cardiopulmonary disease.  Original Report Authenticated By: Waynard Reeds, M.D.     1. Asthma exacerbation       Date: 11/04/2011  Rate: 80  Rhythm: normal sinus rhythm  QRS Axis: normal  Intervals: normal  ST/T Wave abnormalities: nonspecific T wave changes  Conduction Disutrbances:none  Narrative Interpretation:   Old EKG Reviewed: unchanged   MDM  Pt is a well-appearing 51 year old African female with a past medical history of asthma presenting with chief complaint of asthma exacerbation. No precipitants identified. Home dose albuterol has helped but has not resolved her symptoms. No other complaints at this time. Vital signs stable patient afebrile in no acute distress. Diminished breath sounds on exam which improved significantly with the breathing treatment. Prior discharge patient stated she felt like she  was back to baseline and was ready to go home. Chest x-ray unremarkable. EKG unremarkable. Discharged home with a short course of prednisone and PCP followup. Return precautions discussed with the patient verbalized understanding of.        Consuello Masse, MD 11/04/11 [phone]

## 2011-11-04 NOTE — ED Provider Notes (Signed)
I saw and evaluated the patient, reviewed the resident's note and I agree with the findings and plan.  Loren Racer, MD 11/04/11 [phone]

## 2011-12-25 ENCOUNTER — Emergency Department (HOSPITAL_COMMUNITY): Payer: Medicare Other

## 2011-12-25 ENCOUNTER — Emergency Department (HOSPITAL_COMMUNITY)
Admission: EM | Admit: 2011-12-25 | Discharge: 2011-12-25 | Disposition: A | Discharge status: Home / Self Care | Payer: Medicare Other | Attending: Emergency Medicine | Admitting: Emergency Medicine

## 2011-12-25 ENCOUNTER — Encounter (HOSPITAL_COMMUNITY): Payer: Self-pay | Admitting: Emergency Medicine

## 2011-12-25 DIAGNOSIS — F329 Major depressive disorder, single episode, unspecified: Secondary | ICD-10-CM | POA: Insufficient documentation

## 2011-12-25 DIAGNOSIS — K219 Gastro-esophageal reflux disease without esophagitis: Secondary | ICD-10-CM | POA: Insufficient documentation

## 2011-12-25 DIAGNOSIS — F3289 Other specified depressive episodes: Secondary | ICD-10-CM | POA: Insufficient documentation

## 2011-12-25 DIAGNOSIS — G4733 Obstructive sleep apnea (adult) (pediatric): Secondary | ICD-10-CM | POA: Insufficient documentation

## 2011-12-25 DIAGNOSIS — R109 Unspecified abdominal pain: Secondary | ICD-10-CM

## 2011-12-25 DIAGNOSIS — R42 Dizziness and giddiness: Secondary | ICD-10-CM | POA: Insufficient documentation

## 2011-12-25 DIAGNOSIS — G35 Multiple sclerosis: Secondary | ICD-10-CM | POA: Insufficient documentation

## 2011-12-25 DIAGNOSIS — R1031 Right lower quadrant pain: Secondary | ICD-10-CM | POA: Insufficient documentation

## 2011-12-25 DIAGNOSIS — R404 Transient alteration of awareness: Secondary | ICD-10-CM | POA: Insufficient documentation

## 2011-12-25 DIAGNOSIS — M129 Arthropathy, unspecified: Secondary | ICD-10-CM | POA: Insufficient documentation

## 2011-12-25 DIAGNOSIS — R55 Syncope and collapse: Secondary | ICD-10-CM | POA: Insufficient documentation

## 2011-12-25 DIAGNOSIS — Z79899 Other long term (current) drug therapy: Secondary | ICD-10-CM | POA: Insufficient documentation

## 2011-12-25 LAB — BASIC METABOLIC PANEL
BUN: 9 mg/dL (ref 6–23)
CO2: 27 mEq/L (ref 19–32)
Calcium: 10.2 mg/dL (ref 8.4–10.5)
Chloride: 99 mEq/L (ref 96–112)
Creatinine, Ser: 0.94 mg/dL (ref 0.50–1.10)
GFR calc Af Amer: 81 mL/min — ABNORMAL LOW (ref >90)
GFR calc non Af Amer: 70 mL/min — ABNORMAL LOW (ref >90)
Glucose, Bld: 106 mg/dL — ABNORMAL HIGH (ref 70–99)
Potassium: 3.5 mEq/L (ref 3.5–5.1)
Sodium: 139 mEq/L (ref 135–145)

## 2011-12-25 LAB — CBC
HCT: 47.4 % — ABNORMAL HIGH (ref 36.0–46.0)
Hemoglobin: 16.1 g/dL — ABNORMAL HIGH (ref 12.0–15.0)
MCH: 28.5 pg (ref 26.0–34.0)
MCHC: 34 g/dL (ref 30.0–36.0)
MCV: 84 fL (ref 78.0–100.0)
Platelets: 362 10*3/uL (ref 150–400)
RBC: 5.64 MIL/uL — ABNORMAL HIGH (ref 3.87–5.11)
RDW: 13.4 % (ref 11.5–15.5)
WBC: 5.7 10*3/uL (ref 4.0–10.5)

## 2011-12-25 LAB — POCT I-STAT TROPONIN I: Troponin i, poc: 0 ng/mL (ref 0.00–0.08)

## 2011-12-25 LAB — PRO B NATRIURETIC PEPTIDE: Pro B Natriuretic peptide (BNP): 12.4 pg/mL (ref 0–125)

## 2011-12-25 LAB — PROTIME-INR
INR: 0.99 (ref 0.00–1.49)
Prothrombin Time: 13.3 seconds (ref 11.6–15.2)

## 2011-12-25 MED ORDER — SODIUM CHLORIDE 0.9 % IV SOLN: Freq: Once | INTRAVENOUS | Status: DC

## 2011-12-25 NOTE — ED Provider Notes (Signed)
History     CSN: [id number]  Arrival date & time 12/25/11  1511   First MD Initiated Contact with Patient 12/25/11 1650      Chief Complaint  Patient presents with  . Loss of Consciousness  . Groin Pain    (Consider location/radiation/quality/duration/timing/severity/associated sxs/prior treatment) HPI The patient presents after an episode of loss of consciousness, with new right lower quadrant/inguinal pain.  She notes that soon after awakening, she felt the sharp, sudden onset of burning pain in her right inguinal, right lower quadrant area.  Immediately after this she felt particularly lightheaded, and her family members told her that she had a syncopal event.  She is unsure of LOC, but notes that she has had no confusion, chest pain, dyspnea since that time.  She does have persistent right lower quadrant pain.  Pain continues to be sharp, burning.  No relief with OTC medication.  No clear exacerbating factors. Past Medical History  Diagnosis Date  . Anemia   . Anxiety state, unspecified   . Backache, unspecified   . Depressive disorder, not elsewhere classified   . Obstructive sleep apnea (adult) (pediatric)   . Esophageal reflux   . Other abnormal glucose   . Fibromyalgia   . Arthropathy, unspecified, site unspecified   . MS (multiple sclerosis)     Past Surgical History  Procedure Date  . Appendectomy   . Cholecystectomy   . Tubal ligation   . Vaginal hysterectomy   . Hernia repair     Family History  Problem Relation Age of Onset  . Ovarian cancer Mother   . Heart disease Father     History  Substance Use Topics  . Smoking status: Former Smoker -- 1.0 packs/day for 5 years    Types: Cigarettes    Quit date: 07/07/2007  . Smokeless tobacco: Never Used  . Alcohol Use: No    OB History    Grav Para Term Preterm Abortions TAB SAB Ect Mult Living                  Review of Systems  Constitutional:       HPI  HENT:       HPI otherwise negative    Eyes: Negative.   Respiratory:       HPI, otherwise negative  Cardiovascular:       HPI, otherwise nmegative  Gastrointestinal: Negative for vomiting.  Genitourinary:       HPI, otherwise negative  Musculoskeletal:       HPI, otherwise negative  Skin: Negative.   Neurological: Negative for syncope.    Allergies  Amitriptyline hcl; Butorphanol tartrate; Gabapentin; Iodine; Latex; Penicillins; and Shellfish-derived products  Home Medications   Current Outpatient Rx  Name Route Sig Dispense Refill  . ALBUTEROL SULFATE HFA 108 (90 BASE) MCG/ACT IN AERS Inhalation Inhale 2 puffs into the lungs every 4 (four) hours as needed for wheezing. 1 Inhaler 0  . ALPRAZOLAM 0.5 MG PO TABS Oral Take 0.5 mg by mouth 3 (three) times daily.     . ASENAPINE MALEATE 5 MG SL SUBL Sublingual Place 1 mg under the tongue at bedtime.      . BUPROPION HCL ER (XL) 300 MG PO TB24 Oral Take 300 mg by mouth daily.      . CYCLOBENZAPRINE HCL 10 MG PO TABS Oral Take 10 mg by mouth 3 (three) times daily as needed. For muscle pain    . DULOXETINE HCL 60 MG PO CPEP Oral Take  60 mg by mouth daily.      Marland Kitchen EPINEPHRINE 0.3 MG/0.3ML IJ DEVI Intramuscular Inject 0.3 mg into the muscle daily as needed. For severe allergic reaction    . FAMOTIDINE 20 MG PO TABS Oral Take 20 mg by mouth 2 (two) times daily.      Marland Kitchen FLUTICASONE-SALMETEROL 500-50 MCG/DOSE IN AEPB Inhalation Inhale 1 puff into the lungs every 12 (twelve) hours.      Marland Kitchen LAMOTRIGINE 100 MG PO TABS Oral Take 200 mg by mouth at bedtime.     Marland Kitchen METOPROLOL SUCCINATE ER 50 MG PO TB24 Oral Take 50 mg by mouth daily. Take with or immediately following a meal.    . MORPHINE SULFATE ER 30 MG PO TB12 Oral Take 30 mg by mouth 2 (two) times daily.     Marland Kitchen NABUMETONE 500 MG PO TABS Oral Take 500 mg by mouth 2 (two) times daily as needed. For pain    . ZALEPLON 10 MG PO CAPS Oral Take 10 mg by mouth at bedtime as needed.     Marland Kitchen ZOLPIDEM TARTRATE 10 MG PO TABS Oral Take 5 mg by mouth  at bedtime as needed. For insomnia      BP 128/73  Pulse 100  Temp 97.8 F (36.6 C) (Oral)  Resp 20  SpO2 99%  Physical Exam  Nursing note and vitals reviewed. Constitutional: She is oriented to person, place, and time. She appears well-developed and well-nourished. No distress.  HENT:  Head: Normocephalic and atraumatic.  Eyes: Conjunctivae and EOM are normal.  Cardiovascular: Normal rate and regular rhythm.   Pulmonary/Chest: Effort normal and breath sounds normal. No stridor. No respiratory distress.  Abdominal: She exhibits no distension. There is tenderness in the right lower quadrant. There is no rigidity, no guarding, no tenderness at McBurney's point and negative Murphy's sign.       Pain is most prominent in the right lower lateral abdomen and about the proximal and crease  Musculoskeletal: She exhibits no edema.  Neurological: She is alert and oriented to person, place, and time. No cranial nerve deficit.  Skin: Skin is warm and dry.  Psychiatric: She has a normal mood and affect.    ED Course  Procedures (including critical care time)  Labs Reviewed  CBC - Abnormal; Notable for the following:    RBC 5.64 (*)     Hemoglobin 16.1 (*)     HCT 47.4 (*)     All other components within normal limits  BASIC METABOLIC PANEL - Abnormal; Notable for the following:    Glucose, Bld 106 (*)     GFR calc non Af Amer 70 (*)     GFR calc Af Amer 81 (*)     All other components within normal limits  PRO B NATRIURETIC PEPTIDE  PROTIME-INR  POCT I-STAT TROPONIN I   Dg Chest 2 View  12/25/2011  *RADIOLOGY REPORT*  Clinical Data: Groin pain, loss of consciousness  CHEST - 2 VIEW  Comparison: 11/04/2011  Findings: The heart size and mediastinal contours are within normal limits.  Both lungs are clear.  The visualized skeletal structures are unremarkable. Surgical clips noted in the right upper quadrant of the abdomen.  IMPRESSION: Negative exam.  Original Report Authenticated By:  Rosealee Albee, M.D.     No diagnosis found.  Cardiac: 105 st, abnormal Pulse ox 100%ra, normal   Date: 12/25/2011  Rate: 112  Rhythm: sinus tachycardia  QRS Axis: normal  Intervals: normal  ST/T  Wave abnormalities: nonspecific T wave changes  Conduction Disutrbances:none  Narrative Interpretation:   Old EKG Reviewed: none available ABNORMAL ECG  On re-eval the patient notes feeling better recalls that over the past week she has had decreased bowel frequency, early satiety, and asks for d/c.   MDM  This generally well-appearing female presents with concerns over abdominal pain, and a syncopal versus near syncopal events.  On my exam the patient is in no distress, with mild pain in her right lower quadrant.  The patient's description of pain followed by lightheadedness is suggestive of a vagal episode.  The abdominal pain was suggestive of a ruptured cyst versus acute appendicitis, though the patient has a prior hysterectomy.  Hernia incarceration was also a consideration.  The patient's CT scan was unremarkable, her ECG nonischemic, though she was tachycardic.  CT did demonstrate significant stool burden, but no other acute findings.  The patient's labs were largely unremarkable.  The patient received IV fluids, and noted that she felt significantly better in the ED.  The patient was made aware of all results, and the need for continued management and monitoring of her physician, specifically additional evaluation of what may have been a syncopal event.  The patient requested discharge, and this was accommodated.        Gerhard Munch, MD 12/26/11 0003

## 2011-12-25 NOTE — ED Notes (Signed)
Pt sts stood up this am and had sharp pain in right groin area then had syncopal episode; pt sts SOB and dizzy at present

## 2011-12-25 NOTE — Discharge Instructions (Signed)
It is extremely important that you follow-up with your physician to continue an evaluation of your abdominal pain and passing-out.  If you develop any new, or concerning changes in your condition, please make sure to return to the ED as soon as possible.  You need to be sure to discuss cardiac evaluation, specifically holter monitoring (cardiac monitoring).

## 2011-12-25 NOTE — ED Notes (Addendum)
Pt reports had an episode of SOB last night, presently denies. Respirations even & unlabored, no distress.  Today states when she stood up had sudden sharp pain in right groin & had a syncopal episode. Pain worse with mvmt. Denied injury, CP, palpitations.

## 2012-03-30 ENCOUNTER — Encounter (HOSPITAL_COMMUNITY): Payer: Self-pay | Admitting: *Deleted

## 2012-03-30 ENCOUNTER — Inpatient Hospital Stay (HOSPITAL_COMMUNITY)
Admission: AD | Admit: 2012-03-30 | Discharge: 2012-03-30 | Disposition: A | Discharge status: Home / Self Care | Payer: Medicare Other | Source: Ambulatory Visit | Attending: Obstetrics and Gynecology | Admitting: Obstetrics and Gynecology

## 2012-03-30 ENCOUNTER — Inpatient Hospital Stay (HOSPITAL_COMMUNITY): Payer: Medicare Other

## 2012-03-30 DIAGNOSIS — R1032 Left lower quadrant pain: Secondary | ICD-10-CM

## 2012-03-30 LAB — COMPREHENSIVE METABOLIC PANEL
ALT: 146 U/L — ABNORMAL HIGH (ref 0–35)
AST: 78 U/L — ABNORMAL HIGH (ref 0–37)
Albumin: 3.7 g/dL (ref 3.5–5.2)
Alkaline Phosphatase: 145 U/L — ABNORMAL HIGH (ref 39–117)
BUN: 8 mg/dL (ref 6–23)
CO2: 29 mEq/L (ref 19–32)
Calcium: 9.6 mg/dL (ref 8.4–10.5)
Chloride: 100 mEq/L (ref 96–112)
Creatinine, Ser: 0.8 mg/dL (ref 0.50–1.10)
GFR calc Af Amer: >90 mL/min (ref >90)
GFR calc non Af Amer: 85 mL/min — ABNORMAL LOW (ref >90)
Glucose, Bld: 95 mg/dL (ref 70–99)
Potassium: 3.3 mEq/L — ABNORMAL LOW (ref 3.5–5.1)
Sodium: 139 mEq/L (ref 135–145)
Total Bilirubin: 0.3 mg/dL (ref 0.3–1.2)
Total Protein: 7.8 g/dL (ref 6.0–8.3)

## 2012-03-30 LAB — URINALYSIS, ROUTINE W REFLEX MICROSCOPIC
Bilirubin Urine: NEGATIVE
Glucose, UA: NEGATIVE mg/dL
Hgb urine dipstick: NEGATIVE
Ketones, ur: 15 mg/dL — AB
Leukocytes, UA: NEGATIVE
Nitrite: NEGATIVE
Protein, ur: NEGATIVE mg/dL
Specific Gravity, Urine: 1.025 (ref 1.005–1.030)
Urobilinogen, UA: 1 mg/dL (ref 0.0–1.0)
pH: 6 (ref 5.0–8.0)

## 2012-03-30 LAB — WET PREP, GENITAL
Clue Cells Wet Prep HPF POC: NONE SEEN
Trich, Wet Prep: NONE SEEN
Yeast Wet Prep HPF POC: NONE SEEN

## 2012-03-30 LAB — CBC
HCT: 42.6 % (ref 36.0–46.0)
Hemoglobin: 14.2 g/dL (ref 12.0–15.0)
MCH: 28 pg (ref 26.0–34.0)
MCHC: 33.3 g/dL (ref 30.0–36.0)
MCV: 83.9 fL (ref 78.0–100.0)
Platelets: 309 10*3/uL (ref 150–400)
RBC: 5.08 MIL/uL (ref 3.87–5.11)
RDW: 14 % (ref 11.5–15.5)
WBC: 5.9 10*3/uL (ref 4.0–10.5)

## 2012-03-30 LAB — AMYLASE: Amylase: 95 U/L (ref 0–105)

## 2012-03-30 LAB — LIPASE, BLOOD: Lipase: 19 U/L (ref 11–59)

## 2012-03-30 MED ORDER — METOCLOPRAMIDE HCL 5 MG/ML IJ SOLN
10.0000 mg | Freq: Once | INTRAMUSCULAR | Status: AC
  Administered 2012-03-30: 10 mg via INTRAMUSCULAR
  Filled 2012-03-30: qty 2

## 2012-03-30 MED ORDER — METOCLOPRAMIDE HCL 10 MG PO TABS: 10.0000 mg | ORAL_TABLET | Freq: Four times a day (QID) | ORAL | Status: DC

## 2012-03-30 NOTE — MAU Note (Signed)
Horrible pain in LLQ of abd, started last night. Does have pain medicine- no relief.  Hx of hysterectomy- for fibroids.  Still has ovaries.

## 2012-03-30 NOTE — MAU Provider Note (Addendum)
History     CSN: [id number]  Arrival date and time: 03/30/12 0903   None     Chief Complaint  Patient presents with  . Abdominal Pain   HPI Pt presents c/o of increased pain in the LLQ since Mon or Tues.  She says the pain has been there since her hysterectomy but it was only cramping however it occurred almost qd.  Pain has chronic back pain for a bulging disk for which she takes morphine prn.  She 30mg  at about 5am.  She reports a little nausea but no vomiting.  She has no change in appetite and no change in BMs.  Pertinent Gynecological History: Hysterectomy with AVS in 2001 (?[facility])  Past Medical History  Diagnosis Date  . Anemia   . Anxiety state, unspecified   . Backache, unspecified   . Depressive disorder, not elsewhere classified   . Obstructive sleep apnea (adult) (pediatric)   . Esophageal reflux   . Other abnormal glucose   . Fibromyalgia   . Arthropathy, unspecified, site unspecified   . MS (multiple sclerosis)     Past Surgical History  Procedure Date  . Appendectomy   . Cholecystectomy   . Tubal ligation   . Vaginal hysterectomy   . Hernia repair         Family History  Problem Relation Age of Onset  . Ovarian cancer Mother   . Heart disease Father     History  Substance Use Topics  . Smoking status: Former Smoker -- 1.0 packs/day for 5 years    Types: Cigarettes    Quit date: 07/07/2007  . Smokeless tobacco: Never Used  . Alcohol Use: No    Allergies:  Allergies  Allergen Reactions  . Amitriptyline Hcl Other (See Comments)    Shakes   . Butorphanol Tartrate     REACTION: Hallucinations  . Gabapentin Other (See Comments)    Pt reports severe back and side pain  . Iodine     REACTION: Flushing and fainting  . Latex     REACTION: rash  . Penicillins     REACTION: dyspnea  . Shellfish-Derived Products     Throat swelling    Prescriptions prior to admission  Medication Sig Dispense Refill  . ALPRAZolam (XANAX) 0.5 MG tablet  Take 0.5 mg by mouth 3 (three) times daily.       Marland Kitchen asenapine (SAPHRIS) 5 MG SUBL Place 1 mg under the tongue at bedtime.        Marland Kitchen buPROPion (WELLBUTRIN XL) 300 MG 24 hr tablet Take 300 mg by mouth daily.        . cyclobenzaprine (FLEXERIL) 10 MG tablet Take 10 mg by mouth 3 (three) times daily as needed. For muscle pain      . DULoxetine (CYMBALTA) 60 MG capsule Take 60 mg by mouth daily.        . famotidine (PEPCID) 20 MG tablet Take 20 mg by mouth 2 (two) times daily.        . Fluticasone-Salmeterol (ADVAIR DISKUS) 500-50 MCG/DOSE AEPB Inhale 1 puff into the lungs every 12 (twelve) hours.        . metoprolol succinate (TOPROL-XL) 50 MG 24 hr tablet Take 50 mg by mouth daily. Take with or immediately following a meal.      . morphine (MS CONTIN) 30 MG 12 hr tablet Take 30 mg by mouth 2 (two) times daily.       . nabumetone (RELAFEN) 500 MG tablet Take  500 mg by mouth 2 (two) times daily as needed. For pain      . zolpidem (AMBIEN) 10 MG tablet Take 5 mg by mouth at bedtime as needed. For insomnia      . albuterol (PROAIR HFA) 108 (90 BASE) MCG/ACT inhaler Inhale 2 puffs into the lungs every 4 (four) hours as needed for wheezing.  1 Inhaler  0  . EPINEPHrine (EPI-PEN) 0.3 mg/0.3 mL DEVI Inject 0.3 mg into the muscle daily as needed. For severe allergic reaction      . lamoTRIgine (LAMICTAL) 100 MG tablet Take 300 mg by mouth at bedtime.         ROS Physical Exam   Blood pressure 142/90, pulse 98, temperature 98 F (36.7 C), temperature source Oral, resp. rate 20, height 5\' 7"  (1.702 m), weight 271 lb (122.925 kg).  Physical Exam Lungs CTA CV RRR ABD mild LLQ pain with deep palpation, no R/G Pelvic spec nl appearing ext genitalia and vagina s/p hysterectomy, white d/c noted No pelvic masses palpated Rectal no masses, wnl  MAU Course  Procedures  Labs - wnl except LFTs chronically elevated U/S - bilateral ovaries are wnl and there are no abnl findings UA - +ketone  Assessment and  Plan  P3 with LLQ pain of unclear etiology.  Awaiting results of tests.  I reviewed results of testing.  I recommend pt be seen by GI doctor ASAP because it is unlikely gyn in origin.  If some relief with reglan, will d/c home with reglan and rec to f/u with GI ASAP.  Purcell Nails 03/30/2012, 10:44 AM

## 2012-03-31 ENCOUNTER — Other Ambulatory Visit (HOSPITAL_COMMUNITY): Payer: Self-pay | Admitting: Obstetrics and Gynecology

## 2012-03-31 DIAGNOSIS — R1032 Left lower quadrant pain: Secondary | ICD-10-CM

## 2012-04-02 ENCOUNTER — Emergency Department (EMERGENCY_DEPARTMENT_HOSPITAL)
Admission: EM | Admit: 2012-04-02 | Discharge: 2012-04-05 | Disposition: A | Discharge status: Other Acute Inpatient Hospital | Payer: Medicare Other | Source: Home / Self Care | Attending: Emergency Medicine | Admitting: Emergency Medicine

## 2012-04-02 ENCOUNTER — Encounter (HOSPITAL_COMMUNITY): Payer: Self-pay | Admitting: Emergency Medicine

## 2012-04-02 DIAGNOSIS — R45850 Homicidal ideations: Secondary | ICD-10-CM

## 2012-04-02 DIAGNOSIS — Z79899 Other long term (current) drug therapy: Secondary | ICD-10-CM | POA: Insufficient documentation

## 2012-04-02 DIAGNOSIS — Z9089 Acquired absence of other organs: Secondary | ICD-10-CM | POA: Insufficient documentation

## 2012-04-02 DIAGNOSIS — R45851 Suicidal ideations: Secondary | ICD-10-CM | POA: Insufficient documentation

## 2012-04-02 DIAGNOSIS — G35 Multiple sclerosis: Secondary | ICD-10-CM | POA: Insufficient documentation

## 2012-04-02 NOTE — ED Notes (Signed)
Pt changed into blue paper scrubs. Pt wanded by security. Pt belongings, 2 bags and 1 cane, locked in triage locker #3

## 2012-04-02 NOTE — ED Notes (Signed)
Pt states she has recently lost a niece to cancer, pt tearful stating she feels her anger is out of control, being aggressive with family members, pt states she can hear them whispering about her although they deny this. Pt confirms SI, states she would take an overdose. Pt also confirms HI, pt states she wants to hurt the doctor who said her niece was cancer free.

## 2012-04-03 LAB — BASIC METABOLIC PANEL
BUN: 10 mg/dL (ref 6–23)
CO2: 29 mEq/L (ref 19–32)
Calcium: 9.3 mg/dL (ref 8.4–10.5)
Chloride: 99 mEq/L (ref 96–112)
Creatinine, Ser: 0.86 mg/dL (ref 0.50–1.10)
GFR calc Af Amer: 90 mL/min — ABNORMAL LOW (ref >90)
GFR calc non Af Amer: 77 mL/min — ABNORMAL LOW (ref >90)
Glucose, Bld: 97 mg/dL (ref 70–99)
Potassium: 3.4 mEq/L — ABNORMAL LOW (ref 3.5–5.1)
Sodium: 139 mEq/L (ref 135–145)

## 2012-04-03 LAB — COMPREHENSIVE METABOLIC PANEL
ALT: 101 U/L — ABNORMAL HIGH (ref 0–35)
AST: 49 U/L — ABNORMAL HIGH (ref 0–37)
Albumin: 3.5 g/dL (ref 3.5–5.2)
Alkaline Phosphatase: 121 U/L — ABNORMAL HIGH (ref 39–117)
BUN: 10 mg/dL (ref 6–23)
CO2: 30 mEq/L (ref 19–32)
Calcium: 9.4 mg/dL (ref 8.4–10.5)
Chloride: 100 mEq/L (ref 96–112)
Creatinine, Ser: 0.89 mg/dL (ref 0.50–1.10)
GFR calc Af Amer: 86 mL/min — ABNORMAL LOW (ref >90)
GFR calc non Af Amer: 74 mL/min — ABNORMAL LOW (ref >90)
Glucose, Bld: 96 mg/dL (ref 70–99)
Potassium: 3.4 mEq/L — ABNORMAL LOW (ref 3.5–5.1)
Sodium: 139 mEq/L (ref 135–145)
Total Bilirubin: 0.2 mg/dL — ABNORMAL LOW (ref 0.3–1.2)
Total Protein: 7.6 g/dL (ref 6.0–8.3)

## 2012-04-03 LAB — CBC WITH DIFFERENTIAL/PLATELET
Basophils Absolute: 0.1 10*3/uL (ref 0.0–0.1)
Basophils Relative: 1 % (ref 0–1)
Eosinophils Absolute: 0.4 10*3/uL (ref 0.0–0.7)
Eosinophils Relative: 6 % — ABNORMAL HIGH (ref 0–5)
HCT: 41.2 % (ref 36.0–46.0)
Hemoglobin: 13.8 g/dL (ref 12.0–15.0)
Lymphocytes Relative: 56 % — ABNORMAL HIGH (ref 12–46)
Lymphs Abs: 3.4 10*3/uL (ref 0.7–4.0)
MCH: 28 pg (ref 26.0–34.0)
MCHC: 33.5 g/dL (ref 30.0–36.0)
MCV: 83.6 fL (ref 78.0–100.0)
Monocytes Absolute: 0.3 10*3/uL (ref 0.1–1.0)
Monocytes Relative: 6 % (ref 3–12)
Neutro Abs: 1.9 10*3/uL (ref 1.7–7.7)
Neutrophils Relative %: 31 % — ABNORMAL LOW (ref 43–77)
Platelets: 319 10*3/uL (ref 150–400)
RBC: 4.93 MIL/uL (ref 3.87–5.11)
RDW: 14 % (ref 11.5–15.5)
WBC: 6 10*3/uL (ref 4.0–10.5)

## 2012-04-03 LAB — RAPID URINE DRUG SCREEN, HOSP PERFORMED
Amphetamines: NOT DETECTED
Barbiturates: NOT DETECTED
Benzodiazepines: NOT DETECTED
Cocaine: NOT DETECTED
Opiates: POSITIVE — AB
Tetrahydrocannabinol: NOT DETECTED

## 2012-04-03 LAB — ETHANOL: Alcohol, Ethyl (B): <11 mg/dL (ref 0–11)

## 2012-04-03 MED ORDER — LAMOTRIGINE 150 MG PO TABS
300.0000 mg | ORAL_TABLET | Freq: Every day | ORAL | Status: DC
  Administered 2012-04-03 – 2012-04-04 (×3): 300 mg via ORAL
  Filled 2012-04-03 (×4): qty 2

## 2012-04-03 MED ORDER — ALPRAZOLAM 0.5 MG PO TABS
0.5000 mg | ORAL_TABLET | Freq: Three times a day (TID) | ORAL | Status: DC
  Administered 2012-04-03 – 2012-04-05 (×7): 0.5 mg via ORAL
  Filled 2012-04-03 (×7): qty 1

## 2012-04-03 MED ORDER — FAMOTIDINE 20 MG PO TABS
20.0000 mg | ORAL_TABLET | Freq: Two times a day (BID) | ORAL | Status: DC
  Administered 2012-04-03 – 2012-04-05 (×6): 20 mg via ORAL
  Filled 2012-04-03 (×6): qty 1

## 2012-04-03 MED ORDER — NABUMETONE 500 MG PO TABS
500.0000 mg | ORAL_TABLET | Freq: Two times a day (BID) | ORAL | Status: DC | PRN
  Administered 2012-04-03: 500 mg via ORAL
  Filled 2012-04-03 (×2): qty 1

## 2012-04-03 MED ORDER — DULOXETINE HCL 60 MG PO CPEP
60.0000 mg | ORAL_CAPSULE | Freq: Every day | ORAL | Status: DC
  Administered 2012-04-03 – 2012-04-05 (×3): 60 mg via ORAL
  Filled 2012-04-03 (×3): qty 1

## 2012-04-03 MED ORDER — ONDANSETRON HCL 4 MG PO TABS: 4.0000 mg | ORAL_TABLET | Freq: Three times a day (TID) | ORAL | Status: DC | PRN

## 2012-04-03 MED ORDER — METOPROLOL SUCCINATE ER 50 MG PO TB24
50.0000 mg | ORAL_TABLET | Freq: Every day | ORAL | Status: DC
  Administered 2012-04-03 – 2012-04-05 (×3): 50 mg via ORAL
  Filled 2012-04-03 (×3): qty 1

## 2012-04-03 MED ORDER — ASENAPINE MALEATE 5 MG SL SUBL
5.0000 mg | SUBLINGUAL_TABLET | Freq: Every day | SUBLINGUAL | Status: DC
  Administered 2012-04-03 – 2012-04-04 (×3): 5 mg via SUBLINGUAL
  Filled 2012-04-03 (×6): qty 1

## 2012-04-03 MED ORDER — LORAZEPAM 1 MG PO TABS
1.0000 mg | ORAL_TABLET | Freq: Three times a day (TID) | ORAL | Status: DC | PRN
  Administered 2012-04-03 – 2012-04-05 (×2): 1 mg via ORAL
  Filled 2012-04-03 (×3): qty 1

## 2012-04-03 MED ORDER — MORPHINE SULFATE CR 30 MG PO TB12: 30.0000 mg | ORAL_TABLET | Freq: Two times a day (BID) | ORAL | Status: DC

## 2012-04-03 MED ORDER — ALBUTEROL SULFATE HFA 108 (90 BASE) MCG/ACT IN AERS: 2.0000 | INHALATION_SPRAY | RESPIRATORY_TRACT | Status: DC | PRN

## 2012-04-03 MED ORDER — IBUPROFEN 200 MG PO TABS: 600.0000 mg | ORAL_TABLET | Freq: Three times a day (TID) | ORAL | Status: DC | PRN

## 2012-04-03 MED ORDER — BUPROPION HCL ER (XL) 300 MG PO TB24
300.0000 mg | ORAL_TABLET | Freq: Every day | ORAL | Status: DC
  Administered 2012-04-03 – 2012-04-05 (×3): 300 mg via ORAL
  Filled 2012-04-03 (×3): qty 1

## 2012-04-03 MED ORDER — HYDROCODONE-ACETAMINOPHEN 10-325 MG PO TABS
2.0000 | ORAL_TABLET | Freq: Four times a day (QID) | ORAL | Status: DC | PRN
  Administered 2012-04-03 – 2012-04-05 (×5): 2 via ORAL
  Filled 2012-04-03 (×2): qty 2
  Filled 2012-04-03 (×2): qty 1
  Filled 2012-04-03 (×3): qty 2

## 2012-04-03 MED ORDER — FLUTICASONE-SALMETEROL 500-50 MCG/DOSE IN AEPB
1.0000 | INHALATION_SPRAY | Freq: Two times a day (BID) | RESPIRATORY_TRACT | Status: DC
  Administered 2012-04-03 – 2012-04-05 (×5): 1 via RESPIRATORY_TRACT
  Filled 2012-04-03: qty 14

## 2012-04-03 MED ORDER — ZOLPIDEM TARTRATE 5 MG PO TABS: 5.0000 mg | ORAL_TABLET | Freq: Every evening | ORAL | Status: DC | PRN

## 2012-04-03 MED ORDER — ACETAMINOPHEN 325 MG PO TABS
650.0000 mg | ORAL_TABLET | ORAL | Status: DC | PRN
  Filled 2012-04-03: qty 2

## 2012-04-03 MED ORDER — ZOLPIDEM TARTRATE 5 MG PO TABS
5.0000 mg | ORAL_TABLET | Freq: Every evening | ORAL | Status: DC | PRN
  Administered 2012-04-03 – 2012-04-04 (×2): 5 mg via ORAL
  Filled 2012-04-03 (×2): qty 1

## 2012-04-03 NOTE — ED Notes (Signed)
Consult complete.

## 2012-04-03 NOTE — ED Provider Notes (Signed)
Pt resting, nad. Act eval and telepsych pending.   Suzi Roots, MD 04/03/12 [phone]

## 2012-04-03 NOTE — ED Notes (Signed)
Pt resting quietly in bed with eyes closed. Sitter at bedside. no

## 2012-04-03 NOTE — ED Notes (Signed)
Pt requested pain med. Tylenol 650 mg given; pt threw med into this rn's face. Did not take med. Requesting morphine for pain.

## 2012-04-03 NOTE — ED Notes (Signed)
Pt will try to urinate in 30 minutes.

## 2012-04-03 NOTE — [facility] Counselor (Signed)
[name], assessment counselor at [facility], submitted Pt for admission to [facility]. Shalita Forrest, [facility] confirmed there is no bed currently available. Caroleen Hamman, FNP reviewed clinical information and recommended [facility] psychiatrist review in the morning due to Pt's abnormal liver function and abnormal CBC. Notified Thornell Sartorius of disposition.  Harlin Rain Patsy [place], LPC

## 2012-04-03 NOTE — ED Provider Notes (Signed)
History     CSN: [id number]  Arrival date & time 04/02/12  2121   First MD Initiated Contact with Patient 04/02/12 2344      Chief Complaint  Patient presents with  . Medical Clearance    (Consider location/radiation/quality/duration/timing/severity/associated sxs/prior treatment) HPI Comments: Patient comes in today voluntary due to having both homicidal and suicidal thoughts.  Patient reports that she has been having thoughts of overdosing on medication.  She has also been having homicidal thoughts toward the physician that was treating her niece's cancer.  Her niece recently died of breast cancer.  She has been increasingly depressed and angry since the death.  She denies any recreational drug use or alcohol use.    The history is provided by the patient.    Past Medical History  Diagnosis Date  . Anemia   . Anxiety state, unspecified   . Backache, unspecified   . Depressive disorder, not elsewhere classified   . Obstructive sleep apnea (adult) (pediatric)   . Esophageal reflux   . Other abnormal glucose   . Fibromyalgia   . Arthropathy, unspecified, site unspecified   . MS (multiple sclerosis)     Past Surgical History  Procedure Date  . Appendectomy   . Cholecystectomy   . Tubal ligation   . Vaginal hysterectomy   . Hernia repair   . Abdominal hysterectomy     Family History  Problem Relation Age of Onset  . Ovarian cancer Mother   . Heart disease Father     History  Substance Use Topics  . Smoking status: Former Smoker -- 1.0 packs/day for 5 years    Types: Cigarettes    Quit date: 07/07/2007  . Smokeless tobacco: Never Used  . Alcohol Use: No    OB History    Grav Para Term Preterm Abortions TAB SAB Ect Mult Living   2 2 1 1     1 3       Review of Systems  Psychiatric/Behavioral: Positive for suicidal ideas, disturbed wake/sleep cycle and dysphoric mood. Negative for hallucinations, confusion and self-injury.  All other systems reviewed and  are negative.    Allergies  Amitriptyline hcl; Butorphanol tartrate; Gabapentin; Iodine; Latex; Penicillins; and Shellfish-derived products  Home Medications   Current Outpatient Rx  Name Route Sig Dispense Refill  . ALBUTEROL SULFATE HFA 108 (90 BASE) MCG/ACT IN AERS Inhalation Inhale 2 puffs into the lungs every 4 (four) hours as needed for wheezing. 1 Inhaler 0  . ALPRAZOLAM 0.5 MG PO TABS Oral Take 0.5 mg by mouth 3 (three) times daily.     . ASENAPINE MALEATE 5 MG SL SUBL Sublingual Place 1 mg under the tongue at bedtime.      . BUPROPION HCL ER (XL) 300 MG PO TB24 Oral Take 300 mg by mouth daily.      . CYCLOBENZAPRINE HCL 10 MG PO TABS Oral Take 10 mg by mouth 3 (three) times daily as needed. For muscle pain    . DULOXETINE HCL 60 MG PO CPEP Oral Take 60 mg by mouth daily.      Marland Kitchen EPINEPHRINE 0.3 MG/0.3ML IJ DEVI Intramuscular Inject 0.3 mg into the muscle daily as needed. For severe allergic reaction    . FAMOTIDINE 20 MG PO TABS Oral Take 20 mg by mouth 2 (two) times daily.      Marland Kitchen FLUTICASONE-SALMETEROL 500-50 MCG/DOSE IN AEPB Inhalation Inhale 1 puff into the lungs every 12 (twelve) hours.      Marland Kitchen LAMOTRIGINE  100 MG PO TABS Oral Take 300 mg by mouth at bedtime.     Marland Kitchen METOCLOPRAMIDE HCL 10 MG PO TABS Oral Take 1 tablet (10 mg total) by mouth every 6 (six) hours. 30 tablet 0  . METOPROLOL SUCCINATE ER 50 MG PO TB24 Oral Take 50 mg by mouth daily. Take with or immediately following a meal.    . MORPHINE SULFATE ER 30 MG PO TB12 Oral Take 30 mg by mouth 2 (two) times daily.     Marland Kitchen NABUMETONE 500 MG PO TABS Oral Take 500 mg by mouth 2 (two) times daily as needed. For pain    . ZOLPIDEM TARTRATE 10 MG PO TABS Oral Take 5 mg by mouth at bedtime as needed. For insomnia      BP 142/103  Pulse 87  Temp 98.2 F (36.8 C) (Oral)  Resp 16  Ht 5\' 9"  (1.753 m)  Wt 220 lb (99.791 kg)  BMI 32.49 kg/m2  SpO2 100%  Physical Exam  Nursing note and vitals reviewed. Constitutional: She  appears well-developed and well-nourished.  HENT:  Head: Normocephalic and atraumatic.  Cardiovascular: Normal rate, regular rhythm and normal heart sounds.   Pulmonary/Chest: Effort normal and breath sounds normal.  Neurological: She is alert.  Skin: Skin is warm and dry.  Psychiatric: Her speech is delayed. She is withdrawn. She is not actively hallucinating. Thought content is not paranoid and not delusional. Cognition and memory are normal. She exhibits a depressed mood. She expresses homicidal and suicidal ideation. She expresses suicidal plans. She expresses no homicidal plans.    ED Course  Procedures (including critical care time)   Labs Reviewed  CBC WITH DIFFERENTIAL  COMPREHENSIVE METABOLIC PANEL  URINE RAPID DRUG SCREEN (HOSP PERFORMED)  ETHANOL   No results found.   No diagnosis found.    MDM  Patient comes in voluntarily due to have both homicidal thoughts and suicidal thoughts.  She does have a suicidal plan.  Patient has been discussed with ACT team.  Psych holding orders have been placed.  Med rec orders have been placed.        Pascal Lux [place], PA-C 04/03/12 0107
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
## 2012-04-03 NOTE — ED Provider Notes (Signed)
Medical screening examination/treatment/procedure(s) were performed by non-physician practitioner and as supervising physician I was immediately available for consultation/collaboration.   Loren Racer, MD 04/03/12 [phone]

## 2012-04-03 NOTE — ED Notes (Signed)
Patient is resting comfortably. 

## 2012-04-04 DIAGNOSIS — F4321 Adjustment disorder with depressed mood: Secondary | ICD-10-CM

## 2012-04-04 DIAGNOSIS — F3130 Bipolar disorder, current episode depressed, mild or moderate severity, unspecified: Secondary | ICD-10-CM

## 2012-04-04 LAB — HEPATIC FUNCTION PANEL
ALT: 53 U/L — ABNORMAL HIGH (ref 0–35)
AST: 21 U/L (ref 0–37)
Albumin: 3.3 g/dL — ABNORMAL LOW (ref 3.5–5.2)
Alkaline Phosphatase: 98 U/L (ref 39–117)
Bilirubin, Direct: <0.1 mg/dL (ref 0.0–0.3)
Total Bilirubin: 0.1 mg/dL — ABNORMAL LOW (ref 0.3–1.2)
Total Protein: 6.8 g/dL (ref 6.0–8.3)

## 2012-04-04 NOTE — ED Notes (Signed)
2 pt belongings bag taken out of locker 26 and given to pts sister Meriam Sprague per pt request. Also given pts cane.

## 2012-04-04 NOTE — ED Notes (Signed)
Pt walking around [employer] w/ Economist. Calm and cooperative.

## 2012-04-04 NOTE — ED Notes (Signed)
Pt not accepted at [facility] due to elevated LFT's. Test to be repeated per Dr. Jeraldine Loots.
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
## 2012-04-04 NOTE — ED Provider Notes (Addendum)
Filed Vitals:   04/04/12 0558  BP: 114/72  Pulse: 77  Temp: 98.4 F (36.9 C)  Resp: 15   Pt in NAD. Alert. No new complaints. Still pending placement.  Raeford Razor, MD 04/04/12 0816  Filed Vitals:   04/05/12 0557  BP: 119/78  Pulse: 84  Temp: 98.5 F (36.9 C)  Resp: 18   No acute events overnight. Pt with no new complaints this am. Placement still pending.  Raeford Razor, MD 04/05/12 [phone]

## 2012-04-05 ENCOUNTER — Inpatient Hospital Stay (HOSPITAL_COMMUNITY)
Admission: EM | Admit: 2012-04-05 | Discharge: 2012-04-07 | DRG: 885 | Disposition: A | Discharge status: Home / Self Care | Payer: Medicare Other | Source: Ambulatory Visit | Attending: Psychiatry | Admitting: Psychiatry

## 2012-04-05 ENCOUNTER — Encounter (HOSPITAL_COMMUNITY): Payer: Self-pay | Admitting: *Deleted

## 2012-04-05 DIAGNOSIS — IMO0001 Reserved for inherently not codable concepts without codable children: Secondary | ICD-10-CM | POA: Diagnosis present

## 2012-04-05 DIAGNOSIS — F333 Major depressive disorder, recurrent, severe with psychotic symptoms: Principal | ICD-10-CM

## 2012-04-05 DIAGNOSIS — G4733 Obstructive sleep apnea (adult) (pediatric): Secondary | ICD-10-CM | POA: Diagnosis present

## 2012-04-05 DIAGNOSIS — Z23 Encounter for immunization: Secondary | ICD-10-CM

## 2012-04-05 DIAGNOSIS — K219 Gastro-esophageal reflux disease without esophagitis: Secondary | ICD-10-CM | POA: Diagnosis present

## 2012-04-05 DIAGNOSIS — Z79899 Other long term (current) drug therapy: Secondary | ICD-10-CM

## 2012-04-05 DIAGNOSIS — F411 Generalized anxiety disorder: Secondary | ICD-10-CM

## 2012-04-05 DIAGNOSIS — M549 Dorsalgia, unspecified: Secondary | ICD-10-CM

## 2012-04-05 DIAGNOSIS — G35 Multiple sclerosis: Secondary | ICD-10-CM | POA: Diagnosis present

## 2012-04-05 DIAGNOSIS — M129 Arthropathy, unspecified: Secondary | ICD-10-CM | POA: Diagnosis present

## 2012-04-05 HISTORY — DX: Major depressive disorder, recurrent, severe with psychotic symptoms: F33.3

## 2012-04-05 HISTORY — DX: Unspecified osteoarthritis, unspecified site: M19.90

## 2012-04-05 HISTORY — DX: Generalized anxiety disorder: F41.1

## 2012-04-05 MED ORDER — FLUTICASONE-SALMETEROL 500-50 MCG/DOSE IN AEPB
1.0000 | INHALATION_SPRAY | Freq: Two times a day (BID) | RESPIRATORY_TRACT | Status: DC
  Administered 2012-04-05 – 2012-04-07 (×4): 1 via RESPIRATORY_TRACT
  Filled 2012-04-05: qty 14

## 2012-04-05 MED ORDER — FAMOTIDINE 20 MG PO TABS
20.0000 mg | ORAL_TABLET | Freq: Two times a day (BID) | ORAL | Status: DC
  Administered 2012-04-06 – 2012-04-07 (×3): 20 mg via ORAL
  Filled 2012-04-05 (×9): qty 1

## 2012-04-05 MED ORDER — METOPROLOL SUCCINATE ER 50 MG PO TB24
50.0000 mg | ORAL_TABLET | Freq: Every day | ORAL | Status: DC
  Administered 2012-04-06 – 2012-04-07 (×2): 50 mg via ORAL
  Filled 2012-04-05 (×5): qty 1

## 2012-04-05 MED ORDER — MAGNESIUM HYDROXIDE 400 MG/5ML PO SUSP: 30.0000 mL | Freq: Every day | ORAL | Status: DC | PRN

## 2012-04-05 MED ORDER — ACETAMINOPHEN 325 MG PO TABS: 650.0000 mg | ORAL_TABLET | Freq: Four times a day (QID) | ORAL | Status: DC | PRN

## 2012-04-05 MED ORDER — ALUM & MAG HYDROXIDE-SIMETH 200-200-20 MG/5ML PO SUSP: 30.0000 mL | ORAL | Status: DC | PRN

## 2012-04-05 MED ORDER — DULOXETINE HCL 60 MG PO CPEP
60.0000 mg | ORAL_CAPSULE | Freq: Every day | ORAL | Status: DC
  Administered 2012-04-06 – 2012-04-07 (×2): 60 mg via ORAL
  Filled 2012-04-05: qty 14
  Filled 2012-04-05 (×2): qty 1
  Filled 2012-04-05 (×2): qty 14
  Filled 2012-04-05 (×2): qty 1

## 2012-04-05 MED ORDER — ALBUTEROL SULFATE HFA 108 (90 BASE) MCG/ACT IN AERS
2.0000 | INHALATION_SPRAY | RESPIRATORY_TRACT | Status: DC | PRN
  Filled 2012-04-05: qty 6.7

## 2012-04-05 MED ORDER — LAMOTRIGINE 100 MG PO TABS
300.0000 mg | ORAL_TABLET | Freq: Every day | ORAL | Status: DC
  Administered 2012-04-05 – 2012-04-06 (×2): 300 mg via ORAL
  Filled 2012-04-05 (×2): qty 3
  Filled 2012-04-05: qty 6
  Filled 2012-04-05 (×2): qty 2
  Filled 2012-04-05: qty 3
  Filled 2012-04-05: qty 42

## 2012-04-05 MED ORDER — QUETIAPINE FUMARATE 25 MG PO TABS
25.0000 mg | ORAL_TABLET | Freq: Every day | ORAL | Status: DC
  Administered 2012-04-05 – 2012-04-06 (×2): 25 mg via ORAL
  Filled 2012-04-05 (×2): qty 1
  Filled 2012-04-05: qty 14
  Filled 2012-04-05 (×2): qty 1

## 2012-04-05 MED ORDER — POTASSIUM CHLORIDE CRYS ER 10 MEQ PO TBCR
10.0000 meq | EXTENDED_RELEASE_TABLET | ORAL | Status: DC
  Administered 2012-04-05 – 2012-04-07 (×4): 10 meq via ORAL
  Filled 2012-04-05 (×8): qty 1

## 2012-04-05 MED ORDER — INFLUENZA VIRUS VACC SPLIT PF IM SUSP
0.5000 mL | INTRAMUSCULAR | Status: AC
  Administered 2012-04-06: 0.5 mL via INTRAMUSCULAR

## 2012-04-05 MED ORDER — ACETAMINOPHEN 325 MG PO TABS
650.0000 mg | ORAL_TABLET | Freq: Four times a day (QID) | ORAL | Status: DC | PRN
  Administered 2012-04-06: 650 mg via ORAL

## 2012-04-05 MED ORDER — NABUMETONE 500 MG PO TABS
500.0000 mg | ORAL_TABLET | Freq: Two times a day (BID) | ORAL | Status: DC | PRN
  Administered 2012-04-05: 500 mg via ORAL
  Filled 2012-04-05 (×2): qty 1

## 2012-04-05 MED ORDER — PNEUMOCOCCAL VAC POLYVALENT 25 MCG/0.5ML IJ INJ
0.5000 mL | INJECTION | INTRAMUSCULAR | Status: AC
  Administered 2012-04-06: 0.5 mL via INTRAMUSCULAR

## 2012-04-05 NOTE — ED Notes (Signed)
Patient is resting comfortably. 

## 2012-04-05 NOTE — Tx Team (Signed)
Initial Interdisciplinary Treatment Plan  PATIENT STRENGTHS: (choose at least two) Ability for insight Average or above average intelligence Communication skills General fund of knowledge Supportive family/friends  PATIENT STRESSORS: Financial difficulties Health problems Loss of loved ones* Traumatic event   PROBLEM LIST: Problem List/Patient Goals Date to be addressed Date deferred Reason deferred Estimated date of resolution  Depression 04-05-2012   Discharge  Multiple Grief Issues 04-05-2012   Discharge  Health problems 04-05-2012   Discharge  Suicide Risk 04-05-2012   Discharge                                 DISCHARGE CRITERIA:  Ability to meet basic life and health needs Improved stabilization in mood, thinking, and/or behavior Medical problems require only outpatient monitoring Motivation to continue treatment in a less acute level of care Need for constant or close observation no longer present Reduction of life-threatening or endangering symptoms to within safe limits Verbal commitment to aftercare and medication compliance  PRELIMINARY DISCHARGE PLAN: Attend aftercare/continuing care group Return to previous living arrangement  PATIENT/FAMIILY INVOLVEMENT: This treatment plan has been presented to and reviewed with the patient, [name].  The patient and family have been given the opportunity to ask questions and make suggestions.  Cranford Mon 04/05/2012, 12:30 PM

## 2012-04-05 NOTE — BHH Counselor (Signed)
Accepted to [facility] by Dr. Ferol Luz to Dr. Allena Katz ([phone]). Completed support documentation and updated EDP/RN. Pt is voluntary and to be transported via security.

## 2012-04-05 NOTE — ED Notes (Signed)
Pt awake, watching TV. Resting comfortably.

## 2012-04-05 NOTE — ED Notes (Signed)
Attempted to  speak to intake department in reference to patient LFT level decreasing at 0015 and 0443.

## 2012-04-05 NOTE — ED Notes (Signed)
Security called for transport of pt to [facility], awaiting arrival.

## 2012-04-05 NOTE — Progress Notes (Signed)
Psychoeducational Group Note  Date:  04/05/2012 Time: 2000  Group Topic/Focus:  Wrap-Up Group:   The focus of this group is to help patients review their daily goal of treatment and discuss progress on daily workbooks.  Participation Level:  Did Not Attend  Participation Quality:  N/A Did not attend wrap-up group  Affect:    Cognitive:    Insight:    Engagement in Group:    Additional Comments:  Patient did not attend wrap-up group this evening. Patient remained in bed, asleep and awake for the duration of wrap-up group this evening.  Miles Borkowski, Newton Pigg 04/05/2012, 9:11 PM

## 2012-04-05 NOTE — Evaluation (Signed)
Physical Therapy Evaluation Patient Details Name: [name] MRN: [id number] DOB: 01/10/1961 Today's Date: 04/05/2012 Time: 1540-1600 PT Time Calculation (min): 20 min  PT Assessment / Plan / Recommendation Clinical Impression  51 yo female admitted with depression. Hx of MS, back pain. Pt lives with sister. Usually Mod I with cane for household ambulation. On eval, pt is Min-guard assist for ambulation with cane. Recommended to pt that she have nursing assist/supervise as needed. Will follow. Recommend HHPT at discharge. Limited session-pt requesting to participate in recreation time.     PT Assessment  Patient needs continued PT services    Follow Up Recommendations  Home health PT;Supervision - Intermittent    Barriers to Discharge        Equipment Recommendations  None recommended by PT    Recommendations for Other Services     Frequency Min 2X/week    Precautions / Restrictions Precautions Precautions: Fall Restrictions Weight Bearing Restrictions: No   Pertinent Vitals/Pain 3/10 low back      Mobility  Bed Mobility Bed Mobility: Supine to Sit Supine to Sit: 6: Modified independent (Device/Increase time) Transfers Transfers: Sit to Stand;Stand to Sit Sit to Stand: 5: Supervision;From bed Details for Transfer Assistance: Pt slightly unsteady performing sit>stand without assistive device.  Ambulation/Gait Ambulation/Gait Assistance: 4: Min guard Ambulation Distance (Feet): 125 Feet Assistive device: Straight cane Ambulation/Gait Assistance Details: VC safety. Slow gait speed. Unsteady at times but no LOB.  Gait Pattern: Step-through pattern;Decreased stride length    Shoulder Instructions     Exercises     PT Diagnosis: Difficulty walking;Generalized weakness;Abnormality of gait  PT Problem List: Decreased strength;Decreased activity tolerance;Decreased mobility;Pain;Decreased knowledge of use of DME;Decreased balance PT Treatment Interventions: DME  instruction;Gait training;Functional mobility training;Therapeutic activities;Therapeutic exercise;Patient/family education;Balance training   PT Goals Acute Rehab PT Goals PT Goal Formulation: With patient Time For Goal Achievement: 04/12/12 Potential to Achieve Goals: Good Pt will go Sit to Stand: with modified independence PT Goal: Sit to Stand - Progress: Goal set today Pt will go Stand to Sit: with modified independence PT Goal: Stand to Sit - Progress: Goal set today Pt will Ambulate: 51 - 150 feet;with supervision;with least restrictive assistive device PT Goal: Ambulate - Progress: Goal set today  Visit Information  Last PT Received On: 04/05/12 Assistance Needed: +1    Subjective Data  Subjective: "I just feel tired" Patient Stated Goal: Home   Prior Functioning  Home Living Lives With: Family (sister) Available Help at Discharge: Family Type of Home: House Home Access: Stairs to enter Secretary/administrator of Steps: 2 Entrance Stairs-Rails: Right Home Layout: Two level Alternate Level Stairs-Number of Steps: 1 flight Alternate Level Stairs-Rails: Right Bathroom Toilet: Standard Home Adaptive Equipment: Straight cane;Walker - rolling Prior Function Level of Independence: Needs assistance Needs Assistance: Light Housekeeping;Meal Prep Meal Prep: Total Light Housekeeping: Total Able to Take Stairs?: Yes Communication Communication: No difficulties    Cognition  Overall Cognitive Status: Appears within functional limits for tasks assessed/performed Arousal/Alertness: Awake/alert Orientation Level: Appears intact for tasks assessed Behavior During Session: [facility] for tasks performed    Extremity/Trunk Assessment Right Lower Extremity Assessment RLE ROM/Strength/Tone: Deficits RLE ROM/Strength/Tone Deficits: Strength 4/5 throughout Left Lower Extremity Assessment LLE ROM/Strength/Tone: Deficits LLE ROM/Strength/Tone Deficits: strength 4/5 throughout   Balance  Balance Balance Assessed: Yes Static Standing Balance Static Standing - Balance Support: No upper extremity supported;During functional activity;Right upper extremity supported Static Standing - Comment/# of Minutes: Min-guard assist-had pt perform EO/EC, narrow BOS, 360 degree turn  End of Session PT - End of Session Equipment Utilized During Treatment: Gait belt Activity Tolerance: Patient limited by fatigue Patient left:  (standing waiting to leave for recreation time)  GP Functional Assessment Tool Used: Clinical judgement Functional Limitation: Mobility: Walking and moving around Mobility: Walking and Moving Around Current Status ([id number]): At least 1 percent but less than 20 percent impaired, limited or restricted Mobility: Walking and Moving Around Goal Status [phone]): 0 percent impaired, limited or restricted   Rebeca Alert [facility] 04/05/2012, 4:32 PM [phone]
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
## 2012-04-05 NOTE — ED Notes (Signed)
Pt has bed at [facility] per ACT, will call report.
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
## 2012-04-05 NOTE — Progress Notes (Signed)
D:PAtient in room on approach.  Patient sad and depressed.  Patient tearful during interview.  Patient states she did not know [facility] was like this and patient states she is ready to go home.  Patient states he has chronic back pain and she is dealing with too much.  Patient states her two year old grandson dies in 04/19/23 and she is having a hard time with that and her niece dies in 12-17-11.  Patient did not elaborate but appears flat and depressed.  Patient states he is having passive SI but contracts for safety.  Patient denies HI and denies AVH.  Patient verbally contracts for safety.  PAtietn has been isolated in her room the entire night and has not come out. A: Staff to monitor Q 15 mins for safety.  Encouragement and support offered.  Scheduled medications administered per orders. PRN Relafen administered for pain to back tonight. R: Patient remains safe on the unit.  Patient was in bed resting with eyes closed during follow up for pain.  Patient remains in room cooperative.  Patient did not attend wrap up group.
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
## 2012-04-06 LAB — HEMOGLOBIN A1C
Hgb A1c MFr Bld: 5.9 % — ABNORMAL HIGH (ref <5.7)
Mean Plasma Glucose: 123 mg/dL — ABNORMAL HIGH (ref <117)

## 2012-04-06 MED ORDER — HYDROCODONE-ACETAMINOPHEN 10-325 MG PO TABS
0.5000 | ORAL_TABLET | Freq: Three times a day (TID) | ORAL | Status: DC | PRN
  Administered 2012-04-06 – 2012-04-07 (×3): 1 via ORAL
  Filled 2012-04-06 (×3): qty 1

## 2012-04-06 MED ORDER — DULOXETINE HCL 30 MG PO CPEP
30.0000 mg | ORAL_CAPSULE | Freq: Every day | ORAL | Status: DC
  Administered 2012-04-06: 30 mg via ORAL
  Filled 2012-04-06: qty 1
  Filled 2012-04-06: qty 14
  Filled 2012-04-06 (×2): qty 1
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
## 2012-04-06 NOTE — Progress Notes (Signed)
D: Patient in dayroom watching television.  Patient visible on the unit but not interacting with peers.  Patient pleasant and cooperative but became tearful when speaking about her grandson and niece who passed away.  Patient states her day was a little better but she has had back pain all day.  Patient states she has noticed a difference since she has gotten hydrocodone for pain today.  Patient rates depression 1/10 today but states her anxiety level has been high due to worrying about her pain medications.  Patient states she has started using a walker to get around and she has been able to walk better.  Patient denies SI/HI and denies AVH. A: Staff to monitor Q 15 mins for safety.  Encouragement and support offered.  Patient instructed to wear hospital socks when ambulating.  Scheduled medications administered per orders. R: Patient remains safe on the unit.  Patient remains calm and cooperative on the unit.  Patient no longer tearful after initiala conversation with Clinical research associate.  Patient attended wrap up group tonight and taking administered medications.

## 2012-04-06 NOTE — Progress Notes (Signed)
Physical Therapy Note  Attempted PT tx session this p.m. Pt declines to participate at this time due to severe low back pain-rates 10/10 at rest. Pt states RN is aware. Discussed ambulation with cane today. Pt states she has been holding onto cane and wall/handrail. Recommended pt use RW during hospital stay and after discharge-pt verbalizes understanding. Switched out single-point cane for RW for pt to use for remainder of stay. Continue to recommend HHPT. Will check back another day. Thanks. Rebeca Alert, PT [phone]

## 2012-04-06 NOTE — Progress Notes (Signed)
  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  Generalized Anxiety Disorder.  ADL's:  Intact  Sleep:  Yes,  AEB:  Appetite: improving Suicidal Ideation: No suicidal ideation, no plan, no intent, no means.  Homicidal Ideation:  No homicidal ideation, no plan, no intent, no means.  Subjective: Patient notes that she is having 10/10 pain.    height is 5\' 7"  (1.702 m) and weight is 108.863 kg (240 lb). Her oral temperature is 97.7 F (36.5 C). Her blood pressure is 136/86 and her pulse is 78. Her respiration is 18.   Objective: Reports of pain is out of proportion to patient's behavior.  She is resting comfortably in bed.   Mental Status: Level of Consciousness:  Alert Orientation: x 3 General Appearance : Casual Behavior:  cooperative Eye Contact:  good Motor Behavior:  normal Speech:  clear Mood:  depressed Affect:  congruent Anxiety Level:  significant Thought Process:  linear Thought Content:  Denies [facility] Perception:  intact Judgment:  fair Insight:  limited Cognition:  At least average Sleep:  Number of Hours: 6.25   Lab Results:  Results for orders placed during the hospital encounter of 04/05/12 (from the past 48 hour(s))  HEMOGLOBIN A1C     Status: Abnormal   Collection Time   04/05/12  8:17 PM      Component Value Range Comment   Hemoglobin A1C 5.9 (*) <5.7 %    Mean Plasma Glucose 123 (*) <117 mg/dL    Labs are reviewed. Physical Findings: AIMS: Facial and Oral Movements Muscles of Facial Expression: None, normal Lips and Perioral Area: None, normal Jaw: None, normal Tongue: None, normal,Extremity Movements Upper (arms, wrists, hands, fingers): None, normal Lower (legs, knees, ankles, toes): None, normal, Trunk Movements Neck, shoulders, hips: None, normal, Overall Severity Severity of abnormal movements (highest score from questions above): None,  normal Patient's awareness of abnormal movements (rate only patient's report): No Awareness, Dental Status Current problems with teeth and/or dentures?: Yes (lots of broken teeth in back ) Does patient usually wear dentures?: No  CIWA:    COWS:    Medication:  . DULoxetine  30 mg Oral QAC supper  . DULoxetine  60 mg Oral Daily  . famotidine  20 mg Oral BID  . Fluticasone-Salmeterol  1 puff Inhalation Q12H  . influenza  inactive virus vaccine  0.5 mL Intramuscular Tomorrow-1000  . lamoTRIgine  300 mg Oral QHS  . metoprolol succinate  50 mg Oral Daily  . pneumococcal 23 valent vaccine  0.5 mL Intramuscular Tomorrow-1000  . potassium chloride  10 mEq Oral BH-qamhs  . QUEtiapine  25 mg Oral QHS   Summary: 1. Continue for crisis management and stabilization. 2. Medication management to reduce current symptoms to base line and improve the patient's overall level of functioning 3. Treat health problems as indicated. 4. Develop treatment plan to decrease risk of relapse upon discharge and the need for readmission. 5. Psycho-social education regarding relapse prevention and self care. 6. Health care follow up as needed for medical problems. 7. Restart home medications where appropriate.   Treatment Plan : 1. Increase Cymbalta to 90mg .  Will add 30mg  at 1800pm. 2. Will restart the hydrocodone for pain as written. 3. Continue all other medications as written. 4. Continue to follow. Rona Ravens. Charmine Bockrath PAC 04/06/2012, 5:05 PM

## 2012-04-06 NOTE — Progress Notes (Addendum)
D: Patient self inventory sheet states: sleep is fair, appetite is improving, depression 2/10, hopelessness 1/10, anxiety 10/10, denies SI/HI and AVH. Pain is 10/10, patient stated " tylenol and ibuprofen does not help my pain and I am allergic to Neurontin". Patient refused prn pain meds.  Patient did not attend group this morning, she remained in bed d/t not feeling well. Patient is pleasant and cooperative to staff but appears depressed and anxious.  A: Continue to encourage patient to attend groups and verbalize feelings to staff. Verbal support given. Medications administered as ordered per MD. Pain meds offered.   R: Patient remained in room this morning. Patient stated " I'm in to much pain to do any". Denied prn pain medications. When asked what changes do you plan to make to take better care of yourself, patient stated "spend more time with family, choose more activities like baking or learning a new craft".

## 2012-04-06 NOTE — Progress Notes (Signed)
Psychoeducational Group Note  Date:  04/06/2012 Time:  1100  Group Topic/Focus:  Personal Choices and Values:   The focus of this group is to help patients assess and explore the importance of values in their lives, how their values affect their decisions, how they express their values and what opposes their expression.  Participation Level: Did Not Attend  Participation Quality:  Not Applicable  Affect:  Not Applicable  Cognitive:  Not Applicable  Insight:  Not Applicable  Engagement in Group: Not Applicable  Additional Comments:  Pt did not attend group, pt remained in bed.  Karleen Hampshire Brittini 04/06/2012, 1:40 PM

## 2012-04-06 NOTE — Progress Notes (Signed)
[facility] Group Notes: (Counselor/Nursing/MHT/Case Management/Adjunct) 04/06/2012   @1 :15pm Breathing & Meditation for Anxiety/Anger   Type of Therapy:  Group Therapy  Participation Level:  Did Not Attend - Alyona would not leave her room for group   Angus Palms, LCSW 04/06/2012  2:58 PM

## 2012-04-06 NOTE — Progress Notes (Signed)
Psychoeducational Group Note  Date:  04/06/2012 Time:  2000  Group Topic/Focus:  Wrap-Up Group:   The focus of this group is to help patients review their daily goal of treatment and discuss progress on daily workbooks.  Participation Level:  Active  Participation Quality:  Attentive, Sharing and Supportive  Affect:  Depressed  Cognitive:  Oriented  Insight:  Good  Engagement in Group:  Good  Additional Comments:  Patient shared that she was having a lot of pain yesterday but that she did a bit better today and attended as many groups as she could. Patient said that two of the groups today were especially helpful.  Landynn Dupler, Newton Pigg 04/06/2012, 8:39 PM

## 2012-04-07 MED ORDER — METOPROLOL SUCCINATE ER 50 MG PO TB24: 50.0000 mg | ORAL_TABLET | Freq: Every day | ORAL | Status: DC

## 2012-04-07 MED ORDER — FAMOTIDINE 20 MG PO TABS: 20.0000 mg | ORAL_TABLET | Freq: Two times a day (BID) | ORAL | Status: DC

## 2012-04-07 MED ORDER — QUETIAPINE FUMARATE 25 MG PO TABS: 25.0000 mg | ORAL_TABLET | Freq: Every day | ORAL | Status: DC

## 2012-04-07 MED ORDER — HYDROCODONE-ACETAMINOPHEN 10-325 MG PO TABS
1.0000 | ORAL_TABLET | Freq: Four times a day (QID) | ORAL | Status: DC | PRN
  Administered 2012-04-07: 1 via ORAL
  Filled 2012-04-07: qty 1

## 2012-04-07 MED ORDER — LAMOTRIGINE 150 MG PO TABS: 300.0000 mg | ORAL_TABLET | Freq: Every day | ORAL | Status: DC

## 2012-04-07 MED ORDER — EPINEPHRINE 0.3 MG/0.3ML IJ DEVI: 0.3000 mg | Freq: Every day | INTRAMUSCULAR | Status: DC | PRN

## 2012-04-07 MED ORDER — FLUTICASONE-SALMETEROL 500-50 MCG/DOSE IN AEPB: 1.0000 | INHALATION_SPRAY | Freq: Two times a day (BID) | RESPIRATORY_TRACT | Status: DC

## 2012-04-07 MED ORDER — ALBUTEROL SULFATE HFA 108 (90 BASE) MCG/ACT IN AERS: 2.0000 | INHALATION_SPRAY | RESPIRATORY_TRACT | Status: DC | PRN

## 2012-04-07 MED ORDER — NABUMETONE 500 MG PO TABS: 500.0000 mg | ORAL_TABLET | Freq: Two times a day (BID) | ORAL | Status: DC | PRN

## 2012-04-07 MED ORDER — ADULT MULTIVITAMIN W/MINERALS CH: 1.0000 | ORAL_TABLET | Freq: Every day | ORAL | Status: AC

## 2012-04-07 MED ORDER — HYDROCODONE-ACETAMINOPHEN 10-325 MG PO TABS
1.0000 | ORAL_TABLET | Freq: Four times a day (QID) | ORAL | Status: DC | PRN
Start: 2012-04-07 — End: 2013-01-18

## 2012-04-07 MED ORDER — DULOXETINE HCL 30 MG PO CPEP: ORAL_CAPSULE | ORAL | Status: DC

## 2012-04-07 NOTE — Tx Team (Signed)
Interdisciplinary Treatment Plan Update (Adult)  Date:  04/07/2012  Time Reviewed:  11:18 AM   Progress in Treatment: Attending groups:   Yes   Participating in groups:  Yes Taking medication as prescribed:  Yes Tolerating medication:  Yes Family/Significant othe contact made: Patient to allow contact with family Patient understands diagnosis:  Yes Discussing patient identified problems/goals with staff: Yes Medical problems stabilized or resolved: Yes Denies suicidal/homicidal ideation:Yes Issues/concerns per patient self-inventory:  Other:  New problem(s) identified:  Reason for Continuation of Hospitalization:  Interventions implemented related to continuation of hospitalization:  Additional comments:  Estimated length of stay: Discharge today  Discharge Plan:  Home with outpatient follow up  New goal(s):  Review of initial/current patient goals per problem list:    1.  Goal(s): Eliminate SI/other thoughts of self harm   Met:  Yes  Target date: d/c  As evidenced by: Patient no longer endorsing SI/HI or other thoughts of self harm.    2.  Goal (s):  Reduce depression/anxiety (rated at one today)   Met:  Yes  Target date: d/c  As evidenced by: Patient will rate symptoms at four or below    3.  Goal(s): .stabilize on meds   Met:  Yes  Target date: d/c  As evidenced by: Patient reports being stabilized on medications - less symptomatic    4.  Goal(s): Refer for outpatient follow up   Met:  Yes  Target date: d/c  As evidenced by:  Follow up scheduled  Attendees: Patient:  [name] 04/07/2012 11:18 AM  Family:     Physician:  Franchot Gallo, MD 04/07/2012 11:18 AM   Other:  Verne Spurr, NP  04/07/2012 11:18 AM   CaseManager:  Juline Patch, LCSW 04/07/2012 11:18 AM   Counselor:   04/07/2012 11:18 AM   Other:  Foye Clock, MSW Intern    Interdisciplinary Treatment Plan Update (Adult)  Date:  04/07/2012  Time Reviewed:  11:19 AM     Progress in Treatment: Attending groups:   Yes   Participating in groups:  Yes Taking medication as prescribed:  Yes Tolerating medication:  Yes Family/Significant othe contact made:  Patient understands diagnosis:  Yes Discussing patient identified problems/goals with staff: Yes Medical problems stabilized or resolved: Yes Denies suicidal/homicidal ideation:Yes Issues/concerns per patient self-inventory:  Other:  New problem(s) identified:  Reason for Continuation of Hospitalization:  Interventions implemented related to continuation of hospitalization:  Additional comments:  Estimated length of stay:  Discharge home today  Discharge Plan:  Home with outpatient follow up  New goal(s):  Review of initial/current patient goals per problem list:    1.  Goal(s):  Eliminate SI/other thoughts of self harm   Met:  No  Target date:  As evidenced by:  2.  Goal (s):  Met:  No  Target date:  As evidenced by:  3.  Goal(s):  Met:  No  Target date:  As evidenced by:  4.  Goal(s):  Met:  No  Target date:  As evidenced by:  Attendees: Patient:  [name] 04/07/2012 11:20 AM  Family:     Physician:  Orson Aloe, MD 04/07/2012 11:19 AM   Nursing:    04/07/2012 11:19 AM   CaseManager:  Juline Patch, LCSW 04/07/2012 11:19 AM   Counselor:  Angus Palms, LCSW 04/07/2012 11:19 AM   Other:  Reyes Ivan, LCSWA     .04/07/2012  11:19 AM

## 2012-04-07 NOTE — Discharge Planning (Signed)
04/07/2012  Per State Regulation 482.30 This Chart was reviewed for medical necessity with respect to the patient's Admission/Duration of stay.  Clarice Pole 04/07/2012 Next Review Date: 04/10/2012  Clarice Pole, LCASA 04/07/2012, 10:37 AM

## 2012-04-07 NOTE — Progress Notes (Signed)
[facility] Family/Significant Other Suicide Prevention Education  Suicide Prevention Education:  Education Completed; [name], sister, [phone]) has been identified by the patient as the family member/significant other with whom the patient will be residing, and identified as the person(s) who will aid the patient in the event of a mental health crisis (suicidal ideations/suicide attempt).  With written consent from the patient, the family member/significant other has been provided the following suicide prevention education, prior to the and/or following the discharge of the patient.  The suicide prevention education provided includes the following:  Suicide risk factors  Suicide prevention and interventions  National Suicide Hotline telephone number  [facility] assessment telephone number  [facility] Emergency Assistance 911  [facility] and/or Residential Mobile Crisis Unit telephone number  Request made of family/significant other to:  Remove weapons (e.g., guns, rifles, knives), all items previously/currently identified as safety concern.    Remove drugs/medications (over-the-counter, prescriptions, illicit drugs), all items previously/currently identified as a safety concern.  [name] stated that she was very "impressed" with the progress she has seen in [place]. She indicated that she believes [name] is much better, and is ready to come home. Counselor explained that [name] has been educated on grief and depression, and that follow up appointments have been made with Hospice as well as her counselor and psychiatrist. [name] verbalized understanding of suicide prevention information and stated that there are no guns in the home.   [name] 04/07/2012, 4:28 PM

## 2012-04-07 NOTE — Discharge Summary (Signed)
Physician Discharge Summary Note  Patient:  [name] is an 51 y.o., female MRN:  [id number] DOB:  1961/04/25 Patient phone:  [phone] (home)  Patient address:   [address] [place] [address]   Date of Admission:  04/05/2012 Date of Discharge:  04/07/2012  Discharge Diagnoses: Principal Problem:  *Major depressive disorder, recurrent, severe with psychotic features Active Problems:  Generalized anxiety disorder  Axis Diagnosis:  AXIS I:   Major Depressive Disorder - Recurrent - with Psychotic Features.    Generalized Anxiety Disorder.  AXIS II:   Deferred. AXIS III:  1.  Anemia.   2.  Back Pain - Chronic.   3.  Obstructive Sleep Apnea.   4.  Gastroesophageal Reflux Disease.   5.  Fibromyalgia.   6.  Arthropathy.   7.  Multiple Sclerosis.   8.  Arthritis. AXIS IV:   Chronic Mental Illness.  Multiple Chronic Non-psychiatric Health Issues.  Recent Family Losses. AXIS V:   GAF at time of admission approximately 35.  GAF at time of discharge approximately 55.  Level of Care:  Inpatient Hospitalization.  Reason For Admission: This patient voluntarily came to the ED due to increasing thoughts of self harm and homicidal thoughts toward her niece's MD. She reports feeling thoughts of harming her niece's doctor after she died recently from what sounds like a complication of breast cancer at a relatively young age. She died in 2022-12-10 of this year. The patient also suffered the death of her 38 yr old grandson due to abuse in September of last year.  She has no prior psychiatric admissions in the past and is currently being followed by Dr. Tiajuana Amass as an outpatient. She is being treated for bipolar disorder  Hospital Course:   The patient attended treatment team meeting this am and met with treatment team members. The patient's symptoms, treatment plan and response to treatment was discussed. The patient endorsed that their symptoms have improved. The patient also  stated that they felt stable for discharge.  They reported that from this hospital stay they had learned many coping skills.  In other to maintain their psychiatric stability, they will continue psychiatric care on an outpatient basis. They will follow-up as outlined below.  In addition they were instructed  to take all your medications as prescribed by their mental healthcare provider and to report any adverse effects and or reactions from your medicines to their outpatient provider promptly.  The patient is also instructed and cautioned to not engage in alcohol and or illegal drug use while on prescription medicines.  In the event of worsening symptoms the patient is instructed to call the crisis hotline, 911 and or go to the nearest ED for appropriate evaluation and treatment of symptoms.   Also while a patient in this hospital, the patient received medication management for his psychiatric symptoms. They were ordered and received as outlined below:    Medication List     As of 04/07/2012  1:47 PM    STOP taking these medications         ALPRAZolam 0.5 MG tablet   Commonly known as: XANAX      AMBIEN 10 MG tablet   Generic drug: zolpidem      buPROPion 300 MG 24 hr tablet   Commonly known as: WELLBUTRIN XL      metoCLOPramide 10 MG tablet   Commonly known as: REGLAN      morphine 30 MG 12 hr tablet  Commonly known as: MS CONTIN      polyethylene glycol packet   Commonly known as: MIRALAX / GLYCOLAX      SAPHRIS 10 MG Subl   Generic drug: Asenapine Maleate      zaleplon 5 MG capsule   Commonly known as: SONATA      TAKE these medications      Indication    albuterol 108 (90 BASE) MCG/ACT inhaler   Commonly known as: PROVENTIL HFA;VENTOLIN HFA   Inhale 2 puffs into the lungs every 4 (four) hours as needed for shortness of breath.       DULoxetine 30 MG capsule   Commonly known as: CYMBALTA   Please take 2 capsules in the morning and 1 capsule in the evening for depression  and pain.       EPINEPHrine 0.3 mg/0.3 mL Devi   Commonly known as: EPI-PEN   Inject 0.3 mLs (0.3 mg total) into the muscle daily as needed. For severe allergic reaction       famotidine 20 MG tablet   Commonly known as: PEPCID   Take 1 tablet (20 mg total) by mouth 2 (two) times daily. For stomach acid.       Fluticasone-Salmeterol 500-50 MCG/DOSE Aepb   Commonly known as: ADVAIR   Inhale 1 puff into the lungs every 12 (twelve) hours. For asthma.       HYDROcodone-acetaminophen 10-325 MG per tablet   Commonly known as: NORCO   Take 1 tablet by mouth every 6 (six) hours as needed. For severe pain.       lamoTRIgine 150 MG tablet   Commonly known as: LAMICTAL   Take 2 tablets (300 mg total) by mouth at bedtime. For mood stabilization.       metoprolol succinate 50 MG 24 hr tablet   Commonly known as: TOPROL-XL   Take 1 tablet (50 mg total) by mouth daily. Take with or immediately following a meal for blood pressure control.       multivitamin with minerals Tabs   Take 1 tablet by mouth daily. For nutritional supplementation.       nabumetone 500 MG tablet   Commonly known as: RELAFEN   Take 1 tablet (500 mg total) by mouth 2 (two) times daily as needed. For moderate pain.       QUEtiapine 25 MG tablet   Commonly known as: SEROQUEL   Take 1 tablet (25 mg total) by mouth at bedtime. For sleep and mood stabilization.        They were also enrolled in group counseling sessions and activities in which they participated actively.       Follow-up Information    Follow up with [facility]. On 04/08/2012. (Please go to [facility]'s walk in clinic on Friday, April 08, 2012 or any weekday between 8AM - 3PM)    Contact information:   [address] [place], [place]  [address]  [phone]       Follow up with [facility]. On 04/08/2012. (A Hospice representative will call you at home regarding follow up)    Contact information:   [address] [place], [place]   [address]  [phone]        Upon discharge, patient adamantly denies suicidal, homicidal ideations, auditory, visual hallucinations and or delusional thinking. They left [facility] with all personal belongings via personal transportation in no apparent distress.  Consults:  Please see electronic medical record for details.  Significant Diagnostic Studies:  Please see electronic medical  record for details.  Discharge Vitals:   Blood pressure 126/83, pulse 81, temperature 98 F (36.7 C), temperature source Oral, resp. rate 18, height 5\' 7"  (1.702 m), weight 108.863 kg (240 lb), SpO2 97.00%..  Mental Status Exam: Demographic Factors:  Low socioeconomic status and Unemployed  Mental Status Per Nursing Assessment::   On Admission:  Self-harm thoughts At Time of Discharge:  Time was spent today discussing with the patient her current symptoms. The patient reports to having difficulty initiating and maintaining sleep but states she uses C-PAP and O2 at night when at home.  The patient reports an improved appetite and denies any significant feelings of sadness, anhedonia or depressed mood and adamantly denies any suicidal or homicidal ideations.  The patient denies any anxiety related symptoms and states that her pain is under good control.  She denies any medication related side effects or other concerns and is requesting discharge today.  The patient will be discharged today as requested to outpatient follow up.  Current Mental Status Per Physician:  Diagnosis:  Axis I: Major Depressive Disorder - Recurrent - with Psychotic Features.  Generalized Anxiety Disorder.   The patient was seen today and reports the following:   ADL's: Intact.  Sleep: The patient reports to having ongoing difficulty initiating and maintaining sleep at night but states she uses C-PAP and O2 at home.  Appetite: The patient reports that her appetite is improved.   Mild>(1-10) >Severe  Hopelessness (1-10): 0  Depression  (1-10): 0  Anxiety (1-10): 0   Suicidal Ideation: The patient adamantly denies any suicidal ideations today.  Plan: No  Intent: No  Means: No   Homicidal Ideation: The patient adamantly denies any homicidal ideations today.  Plan: No  Intent: No.  Means: No   General Appearance/Behavior: The patient was friendly and cooperative today with this provider and is requesting discharge.  Eye Contact: Good.  Speech: Appropriate in rate and volume with no pressuring noted.  Motor Behavior: wnl.  Level of Consciousness: Alert and Oriented x 3.  Mental Status: Alert and Oriented x 3.  Mood: Appears essentially euthymic.  Affect: Appears bright and full.  Anxiety Level: No anxiety reported today.  Thought Process: The patient denies any auditory or visual hallucinations today or delusional thinking.  Thought Content: The patient denies any auditory or visual hallucinations today or delusional thinking.  Perception: The patient denies any auditory or visual hallucinations today or delusional thinking.  Judgment: Good.  Insight: Good.  Cognition: Oriented to person, place and time.   Loss Factors: Recent Death of Niece and Grandson.    Historical Factors: Chronic Mental Illness.  Multiple Chronic Non-psychiatric Health Issues.  Recent Family Losses.  Risk Reduction Factors:   Sense of responsibility to family, Religious beliefs about death, Living with another person, especially a relative, Positive social support and Positive coping skills or problem solving skills  Continued Clinical Symptoms:  Depression:   Anhedonia More than one psychiatric diagnosis Previous Psychiatric Diagnoses and Treatments Medical Diagnoses and Treatments/Surgeries  Discharge Diagnoses:   AXIS I:   Major Depressive Disorder - Recurrent - with Psychotic Features.    Generalized Anxiety Disorder.  AXIS II:   Deferred. AXIS III:  1.  Anemia.   2.  Back Pain - Chronic.   3.  Obstructive Sleep Apnea.   4.   Gastroesophageal Reflux Disease.   5.  Fibromyalgia.   6.  Arthropathy.   7.  Multiple Sclerosis.   8.  Arthritis. AXIS IV:  Chronic Mental Illness.  Multiple Chronic Non-psychiatric Health Issues.  Recent Family Losses. AXIS V:   GAF at time of admission approximately 35.  GAF at time of discharge approximately 55.  Cognitive Features That Contribute To Risk:  None Noted.    Current Medications:  . DULoxetine  30 mg Oral QAC supper  . DULoxetine  60 mg Oral Daily  . famotidine  20 mg Oral BID  . Fluticasone-Salmeterol  1 puff Inhalation Q12H  . lamoTRIgine  300 mg Oral QHS  . metoprolol succinate  50 mg Oral Daily  . potassium chloride  10 mEq Oral BH-qamhs  . QUEtiapine  25 mg Oral QHS   Review of Systems:  Neurological: No headaches, seizures or dizziness reported.  G.I.: The patient denies any constipation or stomach upset today.  Musculoskeletal: The patient reports chronic musculoskeletal related issues with chronic pain which is under good control today.   Time was spent today discussing with the patient her current symptoms. The patient reports to having difficulty initiating and maintaining sleep but states she uses C-PAP and O2 at night when at home.  The patient reports an improved appetite and denies any significant feelings of sadness, anhedonia or depressed mood and adamantly denies any suicidal or homicidal ideations.  The patient denies any anxiety related symptoms and states that her pain is under good control.  She denies any medication related side effects or other concerns and is requesting discharge today.  The patient will be discharged today as requested to outpatient follow up.  Treatment Plan Summary:  1. Daily contact with patient to assess and evaluate symptoms and progress in treatment.  2. Medication management  3. The patient will deny suicidal ideations or homicidal ideations for 48 hours prior to discharge and have a depression and anxiety rating of 3  or less. The patient will also deny any auditory or visual hallucinations or delusional thinking.  4. The patient will deny any symptoms of substance withdrawal at time of discharge.   Plan:  1. Will continue the patient on the medication Cymbalta at 60 mgs po q am and 30 mgs po q evening for depression and chronic pain.  2. Will continue the patient on the medication Lamictal at 300 mgs po qhs for mood stabilization.  3. Will start the patient on the medication Seroquel at 25 mgs po qhs for sleep and psychotic features.  4. Will continue the patient on the medication Pepcid at 20 mgs po BID for GERD.  5. Will continue the patient on her non-psychiatric medications as listed above.  6. Laboratory Studies reviewed.  7. Will continue to monitor.  8. The patient will be discharged today to outpatient follow up at the patient's request.  Suicide Risk:  Minimal: No identifiable suicidal ideation.  Patients presenting with no risk factors but with morbid ruminations; may be classified as minimal risk based on the severity of the depressive symptoms  Plan Of Care/Follow-up recommendations:  Activity:  As tolerated. Diet:  Low sodium, Heart Healthy Diet. Other:  Please take all medications only as directed and keep all scheduled follow up appointments.  Please return to your local Emergency Room should you have any thoughts of harming yourself or others.  Please make should you go to your Primary Care Phyisician within a week to have your potassium rechecked.  Discharge destination:  Home  Is patient on multiple antipsychotic therapies at discharge:  No  Has Patient had three or more failed trials of antipsychotic monotherapy  by history: N/A Recommended Plan for Multiple Antipsychotic Therapies: N/A Discharge Orders    Future Orders Please Complete By Expires   Diet - low sodium heart healthy      Increase activity slowly      Discharge instructions      Comments:   Please take all  medications only as directed.  DO NOT take any medications which are not listed on your discharge sheet.  Please keep all scheduled follow up appointments.  Please return to your local Emergency Room should you have any thoughts of harming yourself or others.       Medication List     As of 04/07/2012  1:47 PM    STOP taking these medications         ALPRAZolam 0.5 MG tablet   Commonly known as: XANAX      AMBIEN 10 MG tablet   Generic drug: zolpidem      buPROPion 300 MG 24 hr tablet   Commonly known as: WELLBUTRIN XL      metoCLOPramide 10 MG tablet   Commonly known as: REGLAN      morphine 30 MG 12 hr tablet   Commonly known as: MS CONTIN      polyethylene glycol packet   Commonly known as: MIRALAX / GLYCOLAX      SAPHRIS 10 MG Subl   Generic drug: Asenapine Maleate      zaleplon 5 MG capsule   Commonly known as: SONATA      TAKE these medications      Indication    albuterol 108 (90 BASE) MCG/ACT inhaler   Commonly known as: PROVENTIL HFA;VENTOLIN HFA   Inhale 2 puffs into the lungs every 4 (four) hours as needed for shortness of breath.       DULoxetine 30 MG capsule   Commonly known as: CYMBALTA   Please take 2 capsules in the morning and 1 capsule in the evening for depression and pain.       EPINEPHrine 0.3 mg/0.3 mL Devi   Commonly known as: EPI-PEN   Inject 0.3 mLs (0.3 mg total) into the muscle daily as needed. For severe allergic reaction       famotidine 20 MG tablet   Commonly known as: PEPCID   Take 1 tablet (20 mg total) by mouth 2 (two) times daily. For stomach acid.       Fluticasone-Salmeterol 500-50 MCG/DOSE Aepb   Commonly known as: ADVAIR   Inhale 1 puff into the lungs every 12 (twelve) hours. For asthma.       HYDROcodone-acetaminophen 10-325 MG per tablet   Commonly known as: NORCO   Take 1 tablet by mouth every 6 (six) hours as needed. For severe pain.       lamoTRIgine 150 MG tablet   Commonly known as: LAMICTAL   Take 2 tablets  (300 mg total) by mouth at bedtime. For mood stabilization.       metoprolol succinate 50 MG 24 hr tablet   Commonly known as: TOPROL-XL   Take 1 tablet (50 mg total) by mouth daily. Take with or immediately following a meal for blood pressure control.       multivitamin with minerals Tabs   Take 1 tablet by mouth daily. For nutritional supplementation.       nabumetone 500 MG tablet   Commonly known as: RELAFEN   Take 1 tablet (500 mg total) by mouth 2 (two) times daily as needed. For moderate pain.  QUEtiapine 25 MG tablet   Commonly known as: SEROQUEL   Take 1 tablet (25 mg total) by mouth at bedtime. For sleep and mood stabilization.            Follow-up Information    Follow up with [facility]. On 04/08/2012. (Please go to [facility]'s walk in clinic on Friday, April 08, 2012 or any weekday between 8AM - 3PM)    Contact information:   [address] [place], [place]  [address]  [phone]       Follow up with [facility]. On 04/08/2012. (A Hospice representative will call you at home regarding follow up)    Contact information:   [address] [place], [place]  [address]  [phone]        Follow-up recommendations:   Activities: Resume typical activities Diet: Resume typical diet Other: Follow up with outpatient provider and report any side effects to out patient prescriber.  Comments:  Take all your medications as prescribed by your mental healthcare provider. Report any adverse effects and or reactions from your medicines to your outpatient provider promptly. Patient is instructed and cautioned to not engage in alcohol and or illegal drug use while on prescription medicines. In the event of worsening symptoms, patient is instructed to call the crisis hotline, 911 and or go to the nearest ED for appropriate evaluation and treatment of symptoms. Follow-up with your primary care provider for your other medical issues, concerns and or health care  needs.  Signed: Franchot Gallo 04/07/2012 1:47 PM

## 2012-04-07 NOTE — Progress Notes (Signed)
        [facility] Group Notes: (Counselor/Nursing/MHT/Case Management/Adjunct) 04/07/2012   @1 :15pm [facility] [employer] [facility] in [place]  Type of Therapy:  Group Therapy  Participation Level:  Good  Participation Quality:  Good  Affect:  Appropriate  Cognitive:  Appropriate  Insight:  Good  Engagement in Group:  Good  Engagement in Therapy:  Good  Modes of Intervention:  Support and Exploration  Summary of Progress/Problems: [name] participated with speaker from [employer] of  and expressed interest in programs [facility] offers. She seemed to relate very well to the speaker and other group members, and asked about various recovery events she can become involved in.   [name] 04/07/2012  2:52 PM

## 2012-04-07 NOTE — Progress Notes (Addendum)
[facility] Case Management Discharge Plan:  Will you be returning to the same living situation after discharge: Yes,  Patient to return to the home she shares with her sisters. At discharge, do you have transportation home?:Yes,  Patient to arrange for sister to  transport her home. Do you have the ability to pay for your medications:Yes,  Patient has private insurance.  [employer] Information:     Release of information consent forms completed and in the chart;  Patient's signature needed at discharge.  Patient to Follow up at:  Follow-up Information    Follow up with [facility]. On 04/08/2012. (Please go to [facility]'s walk in clinic on Friday, April 08, 2012 or any weekday between 8AM - 3PM)    Contact information:   [address] [place], [place]  [address]  [phone]       Follow up with [facility]. On 04/08/2012. (A Hospice representative will call you at home regarding follow up)    Contact information:   [address] [place], [place]  [address]  [phone]         Patient denies SI/HI:   Yes,  Patient is not endorsing SI/HI  or other thoughts of self harm    Safety Planning and Suicide Prevention discussed:  Yes,  Reviewed with patient individually  Barrier to discharge identified:Yes,  Unable to scheduled with previous outpatient provider due to account being referred to collections.  Summary and Recommendations: Patient encouraged to be compliant with medications and follow up with outpatient recommendations.  Writer attempted to schedule patient with [facility] but was advised they have one MD who accepts Medicare.  No appointment available until April or May 2014. Patient advised she will continue with her current MD.  Consent signed and in patient chart.  Patient declined to allow for a follow up appointment to be scheduled.  Patient advised she will go to the walk in clinic.  Wynn Banker 04/07/2012, 12:44 PM

## 2012-04-07 NOTE — [facility] Suicide Risk Assessment (Signed)
Suicide Risk Assessment  Discharge Assessment     Demographic Factors:  Low socioeconomic status and Unemployed  Mental Status Per Nursing Assessment::   On Admission:  Self-harm thoughts At Time of Discharge:  Time was spent today discussing with the patient her current symptoms. The patient reports to having difficulty initiating and maintaining sleep but states she uses C-PAP and O2 at night when at home.  The patient reports an improved appetite and denies any significant feelings of sadness, anhedonia or depressed mood and adamantly denies any suicidal or homicidal ideations.  The patient denies any anxiety related symptoms and states that her pain is under good control.  She denies any medication related side effects or other concerns and is requesting discharge today.  The patient will be discharged today as requested to outpatient follow up.  Current Mental Status Per Physician:  Diagnosis:  Axis I: Major Depressive Disorder - Recurrent - with Psychotic Features.  Generalized Anxiety Disorder.   The patient was seen today and reports the following:   ADL's: Intact.  Sleep: The patient reports to having ongoing difficulty initiating and maintaining sleep at night but states she uses C-PAP and O2 at home.  Appetite: The patient reports that her appetite is improved.   Mild>(1-10) >Severe  Hopelessness (1-10): 0  Depression (1-10): 0  Anxiety (1-10): 0   Suicidal Ideation: The patient adamantly denies any suicidal ideations today.  Plan: No  Intent: No  Means: No   Homicidal Ideation: The patient adamantly denies any homicidal ideations today.  Plan: No  Intent: No.  Means: No   General Appearance/Behavior: The patient was friendly and cooperative today with this provider and is requesting discharge.  Eye Contact: Good.  Speech: Appropriate in rate and volume with no pressuring noted.  Motor Behavior: wnl.  Level of Consciousness: Alert and Oriented x 3.  Mental Status:  Alert and Oriented x 3.  Mood: Appears essentially euthymic.  Affect: Appears bright and full.  Anxiety Level: No anxiety reported today.  Thought Process: The patient denies any auditory or visual hallucinations today or delusional thinking.  Thought Content: The patient denies any auditory or visual hallucinations today or delusional thinking.  Perception: The patient denies any auditory or visual hallucinations today or delusional thinking.  Judgment: Good.  Insight: Good.  Cognition: Oriented to person, place and time.   Loss Factors: Recent Death of Niece and Grandson.    Historical Factors: Chronic Mental Illness.  Multiple Chronic Non-psychiatric Health Issues.  Recent Family Losses.  Risk Reduction Factors:   Sense of responsibility to family, Religious beliefs about death, Living with another person, especially a relative, Positive social support and Positive coping skills or problem solving skills  Continued Clinical Symptoms:  Depression:   Anhedonia More than one psychiatric diagnosis Previous Psychiatric Diagnoses and Treatments Medical Diagnoses and Treatments/Surgeries  Discharge Diagnoses:   AXIS I:   Major Depressive Disorder - Recurrent - with Psychotic Features.    Generalized Anxiety Disorder.  AXIS II:   Deferred. AXIS III:  1.  Anemia.   2.  Back Pain - Chronic.   3.  Obstructive Sleep Apnea.   4.  Gastroesophageal Reflux Disease.   5.  Fibromyalgia.   6.  Arthropathy.   7.  Multiple Sclerosis.   8.  Arthritis. AXIS IV:   Chronic Mental Illness.  Multiple Chronic Non-psychiatric Health Issues.  Recent Family Losses. AXIS V:   GAF at time of admission approximately 35.  GAF at time of discharge  approximately 55.  Cognitive Features That Contribute To Risk:  None Noted.    Current Medications:     . DULoxetine  30 mg Oral QAC supper  . DULoxetine  60 mg Oral Daily  . famotidine  20 mg Oral BID  . Fluticasone-Salmeterol  1 puff Inhalation Q12H  .  lamoTRIgine  300 mg Oral QHS  . metoprolol succinate  50 mg Oral Daily  . potassium chloride  10 mEq Oral BH-qamhs  . QUEtiapine  25 mg Oral QHS   Review of Systems:  Neurological: No headaches, seizures or dizziness reported.  G.I.: The patient denies any constipation or stomach upset today.  Musculoskeletal: The patient reports chronic musculoskeletal related issues with chronic pain which is under good control today.   Time was spent today discussing with the patient her current symptoms. The patient reports to having difficulty initiating and maintaining sleep but states she uses C-PAP and O2 at night when at home.  The patient reports an improved appetite and denies any significant feelings of sadness, anhedonia or depressed mood and adamantly denies any suicidal or homicidal ideations.  The patient denies any anxiety related symptoms and states that her pain is under good control.  She denies any medication related side effects or other concerns and is requesting discharge today.  The patient will be discharged today as requested to outpatient follow up.  Treatment Plan Summary:  1. Daily contact with patient to assess and evaluate symptoms and progress in treatment.  2. Medication management  3. The patient will deny suicidal ideations or homicidal ideations for 48 hours prior to discharge and have a depression and anxiety rating of 3 or less. The patient will also deny any auditory or visual hallucinations or delusional thinking.  4. The patient will deny any symptoms of substance withdrawal at time of discharge.   Plan:  1. Will continue the patient on the medication Cymbalta at 60 mgs po q am and 30 mgs po q evening for depression and chronic pain.  2. Will continue the patient on the medication Lamictal at 300 mgs po qhs for mood stabilization.  3. Will start the patient on the medication Seroquel at 25 mgs po qhs for sleep and psychotic features.  4. Will continue the patient on the  medication Pepcid at 20 mgs po BID for GERD.  5. Will continue the patient on her non-psychiatric medications as listed above.  6. Laboratory Studies reviewed.  7. Will continue to monitor.  8. The patient will be discharged today to outpatient follow up at the patient's request.  Suicide Risk:  Minimal: No identifiable suicidal ideation.  Patients presenting with no risk factors but with morbid ruminations; may be classified as minimal risk based on the severity of the depressive symptoms  Plan Of Care/Follow-up recommendations:  Activity:  As tolerated. Diet:  Low sodium, Heart Healthy Diet. Other:  Please take all medications only as directed and keep all scheduled follow up appointments.  Please return to your local Emergency Room should you have any thoughts of harming yourself or others.  Please make should you go to your Primary Care Phyisician within a week to have your potassium rechecked.  Luster Hechler 04/07/2012, 12:01 PM

## 2012-04-08 NOTE — Progress Notes (Signed)
After Visit Summary (AVS) Faxed to: 04/08/2012 Discharge Summary   Faxed to: 04/08/2012 Suicide Risk Assessment-Discharge Assessment Faxed to: 04/08/2012  Faxed/Sent to the Next Level Care Provider- Hospice @ [phone]       [facility] @ [phone]       [facility] @ [phone]   Emilio Aspen, 04/08/2012, 02:00pm

## 2012-05-04 ENCOUNTER — Emergency Department (HOSPITAL_COMMUNITY)
Admission: EM | Admit: 2012-05-04 | Discharge: 2012-05-04 | Disposition: A | Discharge status: Home / Self Care | Payer: Medicare Other | Attending: Emergency Medicine | Admitting: Emergency Medicine

## 2012-05-04 ENCOUNTER — Encounter (HOSPITAL_COMMUNITY): Payer: Self-pay | Admitting: Emergency Medicine

## 2012-05-04 DIAGNOSIS — Z862 Personal history of diseases of the blood and blood-forming organs and certain disorders involving the immune mechanism: Secondary | ICD-10-CM | POA: Insufficient documentation

## 2012-05-04 DIAGNOSIS — K219 Gastro-esophageal reflux disease without esophagitis: Secondary | ICD-10-CM | POA: Insufficient documentation

## 2012-05-04 DIAGNOSIS — M545 Low back pain, unspecified: Secondary | ICD-10-CM

## 2012-05-04 DIAGNOSIS — Z87891 Personal history of nicotine dependence: Secondary | ICD-10-CM | POA: Insufficient documentation

## 2012-05-04 DIAGNOSIS — Z79899 Other long term (current) drug therapy: Secondary | ICD-10-CM | POA: Insufficient documentation

## 2012-05-04 DIAGNOSIS — G8929 Other chronic pain: Secondary | ICD-10-CM | POA: Insufficient documentation

## 2012-05-04 DIAGNOSIS — F3289 Other specified depressive episodes: Secondary | ICD-10-CM | POA: Insufficient documentation

## 2012-05-04 DIAGNOSIS — G35 Multiple sclerosis: Secondary | ICD-10-CM | POA: Insufficient documentation

## 2012-05-04 DIAGNOSIS — Z8669 Personal history of other diseases of the nervous system and sense organs: Secondary | ICD-10-CM | POA: Insufficient documentation

## 2012-05-04 DIAGNOSIS — G4733 Obstructive sleep apnea (adult) (pediatric): Secondary | ICD-10-CM | POA: Insufficient documentation

## 2012-05-04 DIAGNOSIS — F411 Generalized anxiety disorder: Secondary | ICD-10-CM | POA: Insufficient documentation

## 2012-05-04 DIAGNOSIS — M129 Arthropathy, unspecified: Secondary | ICD-10-CM | POA: Insufficient documentation

## 2012-05-04 DIAGNOSIS — F329 Major depressive disorder, single episode, unspecified: Secondary | ICD-10-CM | POA: Insufficient documentation

## 2012-05-04 LAB — URINALYSIS, ROUTINE W REFLEX MICROSCOPIC
Bilirubin Urine: NEGATIVE
Glucose, UA: NEGATIVE mg/dL
Hgb urine dipstick: NEGATIVE
Ketones, ur: NEGATIVE mg/dL
Nitrite: NEGATIVE
Protein, ur: NEGATIVE mg/dL
Specific Gravity, Urine: 1.018 (ref 1.005–1.030)
Urobilinogen, UA: 0.2 mg/dL (ref 0.0–1.0)
pH: 6 (ref 5.0–8.0)

## 2012-05-04 LAB — URINE MICROSCOPIC-ADD ON

## 2012-05-04 MED ORDER — DIAZEPAM 5 MG PO TABS
5.0000 mg | ORAL_TABLET | Freq: Once | ORAL | Status: AC
  Administered 2012-05-04: 5 mg via ORAL
  Filled 2012-05-04: qty 1

## 2012-05-04 MED ORDER — PREDNISONE 20 MG PO TABS: 40.0000 mg | ORAL_TABLET | Freq: Every day | ORAL | Status: DC

## 2012-05-04 MED ORDER — DIAZEPAM 5 MG PO TABS: 5.0000 mg | ORAL_TABLET | Freq: Two times a day (BID) | ORAL | Status: DC

## 2012-05-04 MED ORDER — HYDROMORPHONE HCL PF 1 MG/ML IJ SOLN
1.0000 mg | Freq: Once | INTRAMUSCULAR | Status: AC
  Administered 2012-05-04: 1 mg via INTRAMUSCULAR
  Filled 2012-05-04: qty 1

## 2012-05-04 NOTE — ED Notes (Signed)
Per pt, steroid shot to back 2 weeks ago to L4-L5, reports since this am having back pain higher than where shot was done; denies loss in bowel function

## 2012-05-04 NOTE — ED Notes (Signed)
Per EMS, woke up this am about 7, c/o severe lower back pain; 10/10 , denies injury; reports cortisone shot about one week ago to same area; vss 142/90, 68, 100% RA

## 2012-05-04 NOTE — ED Notes (Addendum)
The pt has had lower abd pain for 2 weeks she had her lower spine injected then but for the past few days she has also had pain in her lt flank. Pain with movement.

## 2012-05-04 NOTE — ED Notes (Signed)
Her pain is better 

## 2012-05-04 NOTE — ED Provider Notes (Signed)
History   This chart was scribed for Chelsea Chad, MD by Albertha Ghee Rifaie. This patient was seen in room TR08C/TR08C and the patient's care was started at 4:20PM.   CSN: [id number]  Arrival date & time 05/04/12  1522   None     Chief Complaint  Patient presents with  . Back Pain     The history is provided by the patient. No language interpreter was used.    [name] is a 51 y.o. female who was brought by EMS to the Emergency Department complaining of several hours of gradually worsening, gradual onset, sever lower back pain that is non-radiant. She reports getting a steroid shot to her back to L4-L5, about 2 weeks ago. She has been having back pain higher than where the shot was done. She denies loss in BM functions. She repots having MRI, and X-rays of her back. She denies any pain or discomfort with urination, BM, dysuria, fever, and emesis. Pain is aggravated with movement. Pt is not working currently due to disability for MS. She is a former smoker and denies alcohol use.   Past Medical History  Diagnosis Date  . Anemia   . Anxiety state, unspecified   . Backache, unspecified   . Depressive disorder, not elsewhere classified   . Obstructive sleep apnea (adult) (pediatric)   . Esophageal reflux   . Other abnormal glucose   . Fibromyalgia   . Arthropathy, unspecified, site unspecified   . MS (multiple sclerosis)   . Arthritis     Past Surgical History  Procedure Date  . Appendectomy   . Cholecystectomy   . Tubal ligation   . Vaginal hysterectomy   . Hernia repair   . Abdominal hysterectomy     Family History  Problem Relation Age of Onset  . Ovarian cancer Mother   . Heart disease Father     History  Substance Use Topics  . Smoking status: Former Smoker -- 1.0 packs/day for 5 years    Quit date: 07/07/2007  . Smokeless tobacco: Never Used  . Alcohol Use: No    OB History    Grav Para Term Preterm Abortions TAB SAB Ect Mult Living   2 2 1 1      1 3       Review of Systems  Constitutional: Negative.   HENT: Negative.   Eyes: Negative.   Respiratory: Negative.   Cardiovascular: Negative.   Gastrointestinal: Negative.   Genitourinary: Negative.   Musculoskeletal: Positive for back pain.  Skin: Negative.   Neurological: Negative.   Hematological: Negative.   Psychiatric/Behavioral: Negative.     Allergies  Depakote; Latex; Peanut-containing drug products; Penicillins; Shellfish-derived products; Amitriptyline hcl; Butorphanol tartrate; and Gabapentin  Home Medications   Current Outpatient Rx  Name Route Sig Dispense Refill  . ALBUTEROL SULFATE HFA 108 (90 BASE) MCG/ACT IN AERS Inhalation Inhale 2 puffs into the lungs every 4 (four) hours as needed for shortness of breath. 18 g 0  . DULOXETINE HCL 30 MG PO CPEP  Please take 2 capsules in the morning and 1 capsule in the evening for depression and pain. 90 capsule 0  . EPINEPHRINE 0.3 MG/0.3ML IJ DEVI Intramuscular Inject 0.3 mLs (0.3 mg total) into the muscle daily as needed. For severe allergic reaction 1 Device 0  . FAMOTIDINE 20 MG PO TABS Oral Take 1 tablet (20 mg total) by mouth 2 (two) times daily. For stomach acid. 60 tablet 0  . FLUTICASONE-SALMETEROL 500-50 MCG/DOSE  IN AEPB Inhalation Inhale 1 puff into the lungs every 12 (twelve) hours. For asthma. 60 each 0  . HYDROCODONE-ACETAMINOPHEN 10-325 MG PO TABS Oral Take 1 tablet by mouth every 6 (six) hours as needed. For severe pain. 120 tablet 0  . LAMOTRIGINE 150 MG PO TABS Oral Take 2 tablets (300 mg total) by mouth at bedtime. For mood stabilization. 60 tablet 0  . METOPROLOL SUCCINATE ER 50 MG PO TB24 Oral Take 1 tablet (50 mg total) by mouth daily. Take with or immediately following a meal for blood pressure control. 30 tablet 0  . ADULT MULTIVITAMIN W/MINERALS CH Oral Take 1 tablet by mouth daily. For nutritional supplementation. 30 tablet 0  . NABUMETONE 500 MG PO TABS Oral Take 1 tablet (500 mg total) by mouth  2 (two) times daily as needed. For moderate pain. 60 tablet 0  . QUETIAPINE FUMARATE 25 MG PO TABS Oral Take 1 tablet (25 mg total) by mouth at bedtime. For sleep and mood stabilization. 30 tablet 0    BP 133/84  Pulse 83  Temp 99.1 F (37.3 C) (Oral)  Resp 24  SpO2 99%  Physical Exam  Nursing note and vitals reviewed. Constitutional: She is oriented to person, place, and time. She appears well-developed and well-nourished. No distress.  HENT:  Head: Normocephalic and atraumatic.  Right Ear: External ear normal.  Left Ear: External ear normal.  Nose: Nose normal.  Mouth/Throat: Oropharynx is clear and moist. No oropharyngeal exudate.  Eyes: Conjunctivae normal are normal. Pupils are equal, round, and reactive to light. Right eye exhibits no discharge. Left eye exhibits no discharge. No scleral icterus.  Neck: Normal range of motion. Neck supple. No JVD present. No tracheal deviation present.  Cardiovascular: Normal rate, regular rhythm, normal heart sounds and intact distal pulses.  Exam reveals no gallop and no friction rub.   No murmur heard. Pulmonary/Chest: Effort normal and breath sounds normal. No stridor. No respiratory distress. She has no wheezes. She has no rales. She exhibits no tenderness.  Abdominal: Soft. Bowel sounds are normal. She exhibits no distension. There is no tenderness. There is no rebound and no guarding.  Musculoskeletal: Normal range of motion. She exhibits no edema.       Lumbar back: She exhibits tenderness (pain is exacerbated by flexion at the waist.  also note bilateral SI joint tenderness.), pain and spasm. She exhibits no bony tenderness, no swelling, no edema, no deformity, no laceration and normal pulse.       Negative bilateral straight leg raise.  Lymphadenopathy:    She has no cervical adenopathy.  Neurological: She is alert and oriented to person, place, and time.  Skin: Skin is warm and dry. No rash noted. She is not diaphoretic. No erythema.  No pallor.  Psychiatric: She has a normal mood and affect. Her behavior is normal.    ED Course  Procedures (including critical care time)  Labs Reviewed - No data to display No results found.  DIAGNOSTIC STUDIES: Oxygen Saturation is 99% on room air, normal by my interpretation.    COORDINATION OF CARE: 4:20 PM Discussed treatment plan with pt at bedside and pt agreed to plan.    No diagnosis found.    MDM  Pt presents for evaluation of back pain.  She has a history of similar discomfort.  She has no evidence of pyelonephritis on exam and the urinalysis is negative for evidence of a UTI.  Pt's pain has improved s/p dilaudid and valium.  Plan outpt follow-up.  She has no focal neurologic deficits.      I personally performed the services described in this documentation, which was scribed in my presence. The recorded information has been reviewed and considered.    Chelsea Chad, MD 05/04/12 [phone]

## 2012-08-30 DIAGNOSIS — M5416 Radiculopathy, lumbar region: Secondary | ICD-10-CM | POA: Insufficient documentation

## 2012-08-30 HISTORY — DX: Radiculopathy, lumbar region: M54.16

## 2012-10-04 ENCOUNTER — Telehealth: Payer: Self-pay | Admitting: Critical Care Medicine

## 2012-10-04 ENCOUNTER — Other Ambulatory Visit: Payer: Self-pay | Admitting: Obstetrics and Gynecology

## 2012-10-04 DIAGNOSIS — Z803 Family history of malignant neoplasm of breast: Secondary | ICD-10-CM

## 2012-10-04 NOTE — Telephone Encounter (Signed)
Spoke with patient, patient requesting an Advair Rx. Patient has been having meds refilled by her primary doctor and now they are telling her she needs Dr. Delford Field to fill for her. Patient last seen 11/12/2010 I have informed patient that since she has not been seen in over a yr this will not be able to be done unless she comes in to be seen. Patient has been scheduled to see TP 10/07/12 @ 1130am Patient verbalized understanding of this and nothing further needed at this time.

## 2012-10-07 ENCOUNTER — Encounter: Payer: Self-pay | Admitting: Adult Health

## 2012-10-07 ENCOUNTER — Ambulatory Visit (INDEPENDENT_AMBULATORY_CARE_PROVIDER_SITE_OTHER): Payer: Medicare Other | Admitting: Adult Health

## 2012-10-07 VITALS — BP 130/82 | HR 111 | Temp 98.5°F | Ht 69.0 in | Wt 244.2 lb

## 2012-10-07 DIAGNOSIS — J45909 Unspecified asthma, uncomplicated: Secondary | ICD-10-CM

## 2012-10-07 NOTE — Assessment & Plan Note (Signed)
Symptomatic Asthma off therapy   Plan  Restart Advair 250/50 1 puffs Twice daily   Return in  4  months

## 2012-10-07 NOTE — Progress Notes (Signed)
Subjective:    Patient ID: [name], female    DOB: 1960/08/04, 52 y.o.   MRN: [id number] HPI  52 yo AAF with known hx of Asthma and Allergic Rhinitis Former smoker.    10/07/2012 Acute OV  Complains of being off the advair x1 year d/t insurance issues.  SABA use bid x2 week b/c of DOE, tightness in chest, prod cough with clear mucus and some wheezing with the weather change.   Now she has insurance coverage to cover Advair.  Symptoms seem worse with pollen outside.  No hemoptysis , chest pain or edema.  No recent ER or hospital visits  Last CXR 12/2011 w/ no acute process           Past Medical History  Diagnosis Date  . Anemia   . Anxiety state, unspecified   . Backache, unspecified   . Depressive disorder, not elsewhere classified   . Obstructive sleep apnea (adult) (pediatric)   . Esophageal reflux   . Other abnormal glucose   . Fibromyalgia   . Arthropathy, unspecified, site unspecified      Family History  Problem Relation Age of Onset  . Ovarian cancer Mother   . Heart disease Father      History   Social History  . Marital Status: Divorced    Spouse Name: N/A    Number of Children: 2  . Years of Education: N/A   Occupational History  . Disabled    Social History Main Topics  . Smoking status: Former Smoker -- 1.0 packs/day for 5 years    Types: Cigarettes    Quit date: 07/07/2007  . Smokeless tobacco: Never Used  . Alcohol Use: No  . Drug Use: Not on file  . Sexually Active: Not on file   Other Topics Concern  . Not on file   Social History Narrative  . No narrative on file       Review of Systems  Constitutional:   No  weight loss, night sweats,  Fevers,    HEENT:   No headaches,  Difficulty swallowing,  Tooth/dental problems, or  Sore throat,                    CV:  No chest pain,  Orthopnea, PND, swelling in lower extremities, anasarca, dizziness, palpitations, syncope.   GI  No heartburn, indigestion, abdominal pain,  nausea, vomiting, diarrhea, change in bowel habits, loss of appetite, bloody stools.   Resp:  Dyspnea the same  No coughing up of blood.  No change in color of mucus.  Notes  wheezing.  No chest wall deformity  Skin: no rash or lesions.  GU: no dysuria, change in color of urine, no urgency or frequency.  No flank pain, no hematuria   MS:  No joint pain or swelling.  No decreased range of motion.     Psych:  No change in mood or affect. No depression or anxiety.  No memory loss.         Objective:   Physical Exam  GEN: A/Ox3; pleasant , NAD, overweight   HEENT:  [place]/AT,  EACs-clear, TMs-wnl, NOSE-clear drainage.  THROAT-clear, no lesions, no postnasal drip or exudate noted.   NECK:  Supple w/ fair ROM; no JVD; normal carotid impulses w/o bruits; no thyromegaly or nodules palpated; no lymphadenopathy.  RESP  No wheezing,.  Chest wall nontender with no ecchymosis noted.   CARD:  RRR, no m/r/g  , no peripheral edema, pulses  intact, no cyanosis or clubbing.  GI:   Soft & nt; nml bowel sounds; no organomegaly or masses detected.  Musco: Warm bil, no deformities or joint swelling noted.   Neuro: alert, no focal deficits noted.    Skin: Warm, no lesions or rashes          Assessment & Plan:

## 2012-10-07 NOTE — Patient Instructions (Addendum)
Restart Advair 250/50 1 puff Twice daily  -brush/rinse/gargle after use  follow up Dr. Delford Field  In 4 months and As needed

## 2012-10-10 ENCOUNTER — Ambulatory Visit (HOSPITAL_COMMUNITY): Payer: Medicare Other

## 2012-10-14 ENCOUNTER — Ambulatory Visit (HOSPITAL_COMMUNITY)
Admission: RE | Admit: 2012-10-14 | Discharge: 2012-10-14 | Disposition: A | Discharge status: Home / Self Care | Payer: Medicare Other | Source: Ambulatory Visit | Attending: Obstetrics and Gynecology | Admitting: Obstetrics and Gynecology

## 2012-10-14 DIAGNOSIS — Z803 Family history of malignant neoplasm of breast: Secondary | ICD-10-CM

## 2012-10-14 DIAGNOSIS — Z1231 Encounter for screening mammogram for malignant neoplasm of breast: Secondary | ICD-10-CM | POA: Insufficient documentation

## 2012-11-22 ENCOUNTER — Ambulatory Visit: Payer: Self-pay | Admitting: Critical Care Medicine

## 2012-11-25 ENCOUNTER — Ambulatory Visit: Payer: Self-pay | Admitting: Internal Medicine

## 2013-01-17 ENCOUNTER — Ambulatory Visit: Payer: Self-pay | Admitting: Internal Medicine

## 2013-01-18 ENCOUNTER — Other Ambulatory Visit: Payer: Medicare Other

## 2013-01-18 ENCOUNTER — Ambulatory Visit (INDEPENDENT_AMBULATORY_CARE_PROVIDER_SITE_OTHER): Payer: Medicare Other | Admitting: Internal Medicine

## 2013-01-18 ENCOUNTER — Encounter: Payer: Self-pay | Admitting: Internal Medicine

## 2013-01-18 VITALS — BP 114/68 | HR 87 | Ht 68.0 in | Wt 241.8 lb

## 2013-01-18 DIAGNOSIS — J309 Allergic rhinitis, unspecified: Secondary | ICD-10-CM

## 2013-01-18 MED ORDER — AZELASTINE-FLUTICASONE 137-50 MCG/ACT NA SUSP: 1.0000 | Freq: Every day | NASAL | Status: DC

## 2013-01-18 NOTE — Patient Instructions (Addendum)
Order- Allergy profile    Dx allergic rhinitis  Sample Dymista nasal spray     Try 1-2 puffs each nostril once daily at bedtime         We will have our allergy nurse call you next week to make plans for allergy skin testing.   Please remember- antihistamines like claritin, zyrtec and the Dymista nose spray will block the skin tests. For 3 days before the testing, it will be important not to take any antihistamines, cough medicines, or otc sleep medicines. Your regular asthma medicines are ok.

## 2013-01-18 NOTE — Progress Notes (Signed)
Subjective:    Patient ID: [name], female    DOB: [birth year], 52 y.o.   MRN: [id number] HPI  52 yo AAF with known hx of Asthma and Allergic Rhinitis Former smoker.   /10/2012 Acute OV  Complains of being off the advair x1 year d/t insurance issues.  SABA use bid x2 week b/c of DOE, tightness in chest, prod cough with clear mucus and some wheezing with the weather change.   Now she has insurance coverage to cover Advair.  Symptoms seem worse with pollen outside.  No hemoptysis , chest pain or edema.  No recent ER or hospital visits  Last CXR 12/2011 w/ no acute process   01/17/13- 52 yoF former smoker followed for asthma and allergic rhinitis Usually followed here by Dr Delford Field. FOLLOWS FOR: would like to discuss restating allergy vaccine and needs a refill on her epipen.  c/o increased SOB, wheezing, watery eyes, chest tightness with the heat/humidity.  denies cough,  Allergy skin test 10/10/08. On allergy vaccine briefly, stopped with question of skin rash and chest tightness which may not have been related. This year when outside she gets watery eyes, sneezing, pruritic hives. She has tried Zyrtec and Claritin. Chest feels clear. Seasonal spring and summer, than fall symptoms. Lives in an old house where she thinks there is mold. We discussed mold remediation and moisture control.  ROS-see HPI Constitutional:   No-   weight loss, night sweats, fevers, chills, fatigue, lassitude. HEENT:   No-  headaches, difficulty swallowing, tooth/dental problems, sore throat,       +sneezing, itching, ear ache, nasal congestion, post nasal drip,  CV:  No-   chest pain, orthopnea, PND, swelling in lower extremities, anasarca, dizziness, palpitations Resp: No-   shortness of breath with exertion or at rest.              No-   productive cough,  No non-productive cough,  No- coughing up of blood.              No-   change in color of mucus.  No- wheezing.   Skin: + rash per HPI GI:  No-   heartburn,  indigestion, abdominal pain, nausea, vomiting,  GU:  MS:  No-   joint pain or swelling.   Neuro-     nothing unusual Psych:  No- change in mood or affect. No depression or anxiety.  No memory loss.  Objective:  OBJ- Physical Exam General- Alert, Oriented, Affect-appropriate, Distress- none acute. Overweight Skin- rash-none, lesions- none, excoriation- none Lymphadenopathy- none Head- atraumatic            Eyes- Gross vision intact, PERRLA, conjunctivae and secretions clear            Ears- Hearing, canals-normal            Nose- Clear, no-Septal dev, mucus, polyps, erosion, perforation             Throat- Mallampati II , mucosa clear , drainage- none, tonsils- atrophic Neck- flexible , trachea midline, no stridor , thyroid nl, carotid no bruit Chest - symmetrical excursion , unlabored           Heart/CV- RRR , no murmur , no gallop  , no rub, nl s1 s2                           - JVD- none , edema- none, stasis changes- none, varices- none  Lung- clear to P&A, wheeze- none, cough- none , dullness-none, rub- none           Chest wall-  Abd-  Br/ Gen/ Rectal- Not done, not indicated Extrem- cyanosis- none, clubbing, none, atrophy- none, strength- nl. Left arm in sling. Neuro- grossly intact to observation      Assessment & Plan:

## 2013-01-19 LAB — ALLERGY FULL PROFILE
Allergen, D pternoyssinus,d7: 5.34 kU/L — ABNORMAL HIGH
Allergen,Goose feathers, e70: <0.1 kU/L
Alternaria Alternata: <0.1 kU/L
Aspergillus fumigatus, m3: <0.1 kU/L
Bahia Grass: 0.16 kU/L — ABNORMAL HIGH
Bermuda Grass: 1.1 kU/L — ABNORMAL HIGH
Box Elder IgE: 0.14 kU/L — ABNORMAL HIGH
Candida Albicans: 0.36 kU/L — ABNORMAL HIGH
Cat Dander: <0.1 kU/L
Common Ragweed: 0.47 kU/L — ABNORMAL HIGH
Curvularia lunata: <0.1 kU/L
D. farinae: 7.79 kU/L — ABNORMAL HIGH
Dog Dander: 0.16 kU/L — ABNORMAL HIGH
Elm IgE: <0.1 kU/L
Fescue: <0.1 kU/L
G005 Rye, Perennial: <0.1 kU/L
G009 Red Top: 0.15 kU/L — ABNORMAL HIGH
Goldenrod: 0.31 kU/L — ABNORMAL HIGH
Helminthosporium halodes: <0.1 kU/L
House Dust Hollister: 0.74 kU/L — ABNORMAL HIGH
IgE (Immunoglobulin E), Serum: 242.4 IU/mL — ABNORMAL HIGH (ref 0.0–180.0)
Lamb's Quarters: 0.24 kU/L — ABNORMAL HIGH
Oak: 0.14 kU/L — ABNORMAL HIGH
Plantain: 0.15 kU/L — ABNORMAL HIGH
Stemphylium Botryosum: <0.1 kU/L
Sycamore Tree: 0.1 kU/L — ABNORMAL HIGH
Timothy Grass: 0.11 kU/L — ABNORMAL HIGH

## 2013-01-26 ENCOUNTER — Telehealth: Payer: Self-pay | Admitting: Internal Medicine

## 2013-01-26 NOTE — Telephone Encounter (Signed)
Patient states she is returning call from our office to set up for allergy testing Per last OV note: Patient Instructions    Order- Allergy profile Dx allergic rhinitis  Sample Dymista nasal spray Try 1-2 puffs each nostril once daily at bedtime  We will have our allergy nurse call you next week to make plans for allergy skin testing. Please remember- antihistamines like claritin, zyrtec and the Dymista nose spray will block the skin tests. For 3 days before the testing, it will be important not to take any antihistamines, cough medicines, or otc sleep medicines. Your regular asthma medicines are ok.    [name], please advise if you ATC this patient, thanks!

## 2013-01-31 NOTE — Telephone Encounter (Signed)
LMTCB

## 2013-01-31 NOTE — Telephone Encounter (Signed)
Returning call can be reached at [phone].[name]

## 2013-01-31 NOTE — Telephone Encounter (Signed)
[name] is there a date you and Dr. Maple Hudson had discussed with setting pt up for this? Please advise thanks

## 2013-02-01 NOTE — Telephone Encounter (Signed)
LMTCB-I need to review the allergy testing protocol with patient and schedule her for skin testing.

## 2013-02-03 ENCOUNTER — Encounter: Payer: Self-pay | Admitting: Internal Medicine

## 2013-02-03 NOTE — Assessment & Plan Note (Signed)
We discussed her reasons for stopping allergy vaccine after only a few injections. It is not at all clear that she was reacting to the vaccine. Plan-return for allergy skin testing. We will look at those results before decision.

## 2013-02-09 NOTE — Telephone Encounter (Signed)
[name] have you already spoke with pt? Is there a date we can set her up with? thanks

## 2013-02-10 NOTE — Telephone Encounter (Signed)
Per [name] was going to give pt next available allergy testing date. Need to make her aware of the protocol prior to testing for this. No antihistamines, no OTC cough syrups, no OTC sleep aids 3 days prior to testing.   LMTCB x1 on cell # and LM w/ family member TCB x1

## 2013-02-13 NOTE — Telephone Encounter (Signed)
lmtcb x2 

## 2013-02-14 NOTE — Telephone Encounter (Signed)
LMTCBx3. Jennifer Castillo, CMA  

## 2013-02-15 NOTE — Telephone Encounter (Signed)
lmomtcb  

## 2013-02-16 NOTE — Telephone Encounter (Signed)
LMTCBx4. If pt does not return, per protocol will sign off on note. I LMTCB on both numbers listed in chart. Carron Curie, CMA

## 2013-02-17 NOTE — Telephone Encounter (Signed)
ATC patient no answer LMOMTCB Per protocol will sign off on msg

## 2013-03-03 ENCOUNTER — Ambulatory Visit: Payer: Self-pay | Admitting: Critical Care Medicine

## 2013-03-10 ENCOUNTER — Telehealth: Payer: Self-pay | Admitting: Critical Care Medicine

## 2013-03-10 ENCOUNTER — Ambulatory Visit: Payer: Self-pay | Admitting: Critical Care Medicine

## 2013-03-10 NOTE — Telephone Encounter (Signed)
I will forward this as an FYI. Carron Curie, CMA

## 2013-03-10 NOTE — Telephone Encounter (Signed)
Oh wow.  So sorry to hear.....happy to see again later when stable

## 2013-03-22 ENCOUNTER — Ambulatory Visit (INDEPENDENT_AMBULATORY_CARE_PROVIDER_SITE_OTHER): Payer: Medicare Other | Admitting: Internal Medicine

## 2013-03-22 ENCOUNTER — Encounter: Payer: Self-pay | Admitting: Internal Medicine

## 2013-03-22 VITALS — BP 130/76 | HR 92 | Ht 68.0 in | Wt 233.8 lb

## 2013-03-22 DIAGNOSIS — J309 Allergic rhinitis, unspecified: Secondary | ICD-10-CM

## 2013-03-22 DIAGNOSIS — Z23 Encounter for immunization: Secondary | ICD-10-CM

## 2013-03-22 DIAGNOSIS — J45909 Unspecified asthma, uncomplicated: Secondary | ICD-10-CM

## 2013-03-22 MED ORDER — ALBUTEROL SULFATE HFA 108 (90 BASE) MCG/ACT IN AERS: 2.0000 | INHALATION_SPRAY | RESPIRATORY_TRACT | Status: DC | PRN

## 2013-03-22 MED ORDER — MONTELUKAST SODIUM 10 MG PO TABS: 10.0000 mg | ORAL_TABLET | Freq: Every day | ORAL | Status: DC

## 2013-03-22 NOTE — Patient Instructions (Addendum)
Flu vax  Script for Singulair airway anti-inflammatory  Script for rescue albuterol HFA inhaler refill  Ok to take an antihistamine like Zyrtec or claritin as needed  Ok to continue Advair  Please call as needed

## 2013-03-22 NOTE — Progress Notes (Signed)
Subjective:    Patient ID: [name], female    DOB: 08-29-1960, 52 y.o.   MRN: [id number] HPI  52 y.o. AAF with known hx of Asthma and Allergic Rhinitis Former smoker.   /10/2012 Acute OV  Complains of being off the advair x1 year d/t insurance issues.  SABA use bid x2 week b/c of DOE, tightness in chest, prod cough with clear mucus and some wheezing with the weather change.   Now she has insurance coverage to cover Advair.  Symptoms seem worse with pollen outside.  No hemoptysis , chest pain or edema.  No recent ER or hospital visits  Last CXR 12/2011 w/ no acute process   01/17/13- 52 yoF former smoker followed for asthma and allergic rhinitis Usually followed here by Dr Delford Field. FOLLOWS FOR: would like to discuss restating allergy vaccine and needs a refill on her epipen.  c/o increased SOB, wheezing, watery eyes, chest tightness with the heat/humidity.  denies cough,  Allergy skin test 10/10/08. On allergy vaccine briefly, stopped with question of skin rash and chest tightness which may not have been related. This year when outside she gets watery eyes, sneezing, pruritic hives. She has tried Zyrtec and Claritin. Chest feels clear. Seasonal spring and summer, than fall symptoms. Lives in an old house where she thinks there is mold. We discussed mold remediation and moisture control.  03/22/13- 52 yoF former smoker followed for asthma and allergic rhinitis FOLLOWS FOR: review labs in detail with patient; had flare up when she came back to [place] from [place].  Resumed sneezing and wheezing when she came back into [place] 1-  not sure if it was location or time of year. Using Advair and occasional rescue inhaler. Feels well today. Living in house with no pets, no smokers, no mold. Allergy Profile 01/18/2013-total IgE 242.4 with broad elevations for common inhalant allergens. She would be a candidate for allergy vaccine- discussed her previous experience in 2010.  she is satisfied  to wait and watch.  ROS-see HPI Constitutional:   No-   weight loss, night sweats, fevers, chills, fatigue, lassitude. HEENT:   No-  headaches, difficulty swallowing, tooth/dental problems, sore throat,       +sneezing, itching, ear ache, nasal congestion, post nasal drip,  CV:  No-   chest pain, orthopnea, PND, swelling in lower extremities, anasarca, dizziness, palpitations Resp: No-   shortness of breath with exertion or at rest.              No-   productive cough,  No non-productive cough,  No- coughing up of blood.              No-   change in color of mucus.  No- wheezing now.   Skin: + rash per HPI GI:  No-   heartburn, indigestion, abdominal pain, nausea, vomiting,  GU:  MS:  No-   joint pain or swelling.   Neuro-     nothing unusual Psych:  No- change in mood or affect. No depression or anxiety.  No memory loss.  Objective:  OBJ- Physical Exam General- Alert, Oriented, Affect-appropriate, Distress- none acute. Overweight Skin- rash-none, lesions- none, excoriation- none Lymphadenopathy- none Head- atraumatic            Eyes- Gross vision intact, PERRLA, conjunctivae and secretions clear            Ears- Hearing, canals-normal            Nose- Clear, no-Septal dev,  mucus, polyps, erosion, perforation             Throat- Mallampati II , mucosa clear , drainage- none, tonsils- atrophic Neck- flexible , trachea midline, no stridor , thyroid nl, carotid no bruit Chest - symmetrical excursion , unlabored           Heart/CV- RRR , no murmur , no gallop  , no rub, nl s1 s2                           - JVD- none , edema- none, stasis changes- none, varices- none           Lung- clear to P&A, wheeze- none, cough- none , dullness-none, rub- none           Chest wall-  Abd-  Br/ Gen/ Rectal- Not done, not indicated Extrem- cyanosis- none, clubbing, none, atrophy- none, strength- nl. Left arm in sling. Neuro- grossly intact to observation  Assessment & Plan:

## 2013-03-30 NOTE — Assessment & Plan Note (Signed)
Wants to wait before trying allergy vaccine again. Good control for now. Nonspecific exacerbation September 1. Plan-started Singulair, Flu vax

## 2013-03-30 NOTE — Assessment & Plan Note (Signed)
She will use symptomatic therapy. Wants to wait before trying allergy vaccine again. Plan-start Singulair

## 2013-04-03 ENCOUNTER — Encounter: Payer: Self-pay | Admitting: Internal Medicine

## 2013-04-19 ENCOUNTER — Other Ambulatory Visit: Payer: Self-pay | Admitting: Orthopedic Surgery

## 2013-04-19 DIAGNOSIS — M25512 Pain in left shoulder: Secondary | ICD-10-CM

## 2013-04-23 ENCOUNTER — Ambulatory Visit
Admission: RE | Admit: 2013-04-23 | Discharge: 2013-04-23 | Disposition: A | Discharge status: Home / Self Care | Payer: Medicare Other | Source: Ambulatory Visit | Attending: Orthopedic Surgery | Admitting: Orthopedic Surgery

## 2013-04-23 DIAGNOSIS — M25512 Pain in left shoulder: Secondary | ICD-10-CM

## 2013-05-18 ENCOUNTER — Encounter: Payer: Self-pay | Admitting: Gastroenterology

## 2013-05-18 ENCOUNTER — Ambulatory Visit (INDEPENDENT_AMBULATORY_CARE_PROVIDER_SITE_OTHER): Payer: Medicare Other | Admitting: Gastroenterology

## 2013-05-18 ENCOUNTER — Ambulatory Visit (AMBULATORY_SURGERY_CENTER): Payer: Self-pay | Admitting: *Deleted

## 2013-05-18 VITALS — BP 120/80 | HR 66 | Ht 69.0 in | Wt 240.8 lb

## 2013-05-18 VITALS — Ht 69.0 in | Wt 240.8 lb

## 2013-05-18 DIAGNOSIS — Z1211 Encounter for screening for malignant neoplasm of colon: Secondary | ICD-10-CM | POA: Insufficient documentation

## 2013-05-18 MED ORDER — MOVIPREP 100 G PO SOLR: 1.0000 | Freq: Once | ORAL | Status: DC

## 2013-05-18 NOTE — Progress Notes (Signed)
05/18/2013 [name] [id number] Nov 29, 1960   HISTORY OF PRESENT ILLNESS:  Patient is a pleasant 52 year old female who is here today to schedule a screening colonoscopy.  She does not have any complaints of change in bowels or rectal bleeding.  She does have chronic pelvic pain for the past 13 years or so, but it has been extensively evaluated in the past by her PCP/GYN.  She uses night-time oxygen for sleep apnea for the past couple of years.    Past Medical History  Diagnosis Date  . Anemia   . Anxiety state, unspecified   . Backache, unspecified   . Depressive disorder, not elsewhere classified   . Obstructive sleep apnea (adult) (pediatric)   . Esophageal reflux   . Other abnormal glucose   . Fibromyalgia   . Arthropathy, unspecified, site unspecified   . MS (multiple sclerosis)   . Arthritis    Past Surgical History  Procedure Laterality Date  . Appendectomy    . Cholecystectomy    . Tubal ligation    . Vaginal hysterectomy    . Hernia repair    . Abdominal hysterectomy      reports that she quit smoking about 5 years ago. She has never used smokeless tobacco. She reports that she does not drink alcohol or use illicit drugs. family history includes Heart disease in her father; Ovarian cancer in her mother. There is no history of Colon cancer, Esophageal cancer, Stomach cancer, or Rectal cancer. Allergies  Allergen Reactions  . Depakote [Divalproex Sodium] Shortness Of Breath and Rash  . Latex Anaphylaxis  . Peanut-Containing Drug Products Anaphylaxis  . Penicillins Anaphylaxis  . Seroquel [Quetiapine] Anaphylaxis  . Shellfish-Derived Products Anaphylaxis  . Amitriptyline Hcl Other (See Comments)    REACTION: HALLUCINATIONS  . Butorphanol Tartrate Other (See Comments)    REACTION: Hallucinations  . Gabapentin Other (See Comments)    Pt reports severe back and side pain      Outpatient Encounter Prescriptions as of 05/18/2013  Medication Sig  . albuterol  (PROAIR HFA) 108 (90 BASE) MCG/ACT inhaler Inhale 2 puffs into the lungs every 4 (four) hours as needed for wheezing or shortness of breath.  . ALPRAZolam (XANAX) 0.5 MG tablet Take 1 tablet by mouth 3 (three) times daily as needed.  . diazepam (VALIUM) 5 MG tablet Take 1 tablet (5 mg total) by mouth 2 (two) times daily.  . DULoxetine (CYMBALTA) 30 MG capsule Take 30 mg by mouth 2 (two) times daily.   Marland Kitchen EPINEPHrine (EPI-PEN) 0.3 mg/0.3 mL DEVI Inject 0.3 mg into the muscle once. As needed for severe allergic reaction  . famotidine (PEPCID) 20 MG tablet Take 1 tablet (20 mg total) by mouth 2 (two) times daily. For stomach acid.  Marland Kitchen Fluticasone-Salmeterol (ADVAIR DISKUS) 500-50 MCG/DOSE AEPB Inhale 1 puff into the lungs every 12 (twelve) hours. For asthma.  Marland Kitchen GARCINIA CAMBOGIA-CHROMIUM PO Take 1 capsule by mouth daily.  Marland Kitchen HYDROcodone-acetaminophen (NORCO) 10-325 MG per tablet Take 1 tablet by mouth every 6 (six) hours as needed.  . lamoTRIgine (LAMICTAL) 150 MG tablet Take 2 tablets (300 mg total) by mouth at bedtime. For mood stabilization.  Marland Kitchen loratadine (CLARITIN) 10 MG tablet Take 10 mg by mouth daily as needed for allergies.  . metoprolol succinate (TOPROL-XL) 50 MG 24 hr tablet Take 1 tablet (50 mg total) by mouth daily. Take with or immediately following a meal for blood pressure control.  . montelukast (SINGULAIR) 10 MG tablet Take 1  tablet (10 mg total) by mouth at bedtime.  . Multiple Vitamin (MULTIVITAMIN WITH MINERALS) TABS Take 1 tablet by mouth daily. For nutritional supplementation.     REVIEW OF SYSTEMS  : All other systems reviewed and negative except where noted in the History of Present Illness.   PHYSICAL EXAM: There were no vitals taken for this visit. General: Well developed black female in no acute distress Head: Normocephalic and atraumatic Eyes:  Sclerae anicteric, conjunctiva pink. Ears: Normal auditory acuity. Lungs: Clear throughout to auscultation Heart: Regular  rate and rhythm Abdomen: Soft, nontender, non distended. No masses or hepatomegaly noted. Normal bowel sounds Rectal:  Deferred.  Will be done at the time of colonoscopy. Musculoskeletal: Symmetrical with no gross deformities  Skin: No lesions on visible extremities Extremities: No edema  Neurological: Alert oriented x 4, grossly non-focal. Psychological:  Alert and cooperative. Normal mood and affect  ASSESSMENT AND PLAN: -Screening colonoscopy:  Will schedule.  The risks, benefits, and alternatives were discussed with the patient and she consents to proceed.

## 2013-05-18 NOTE — Progress Notes (Signed)
Denies allergies to eggs or soy products. Denies complications with sedation or anesthesia. 

## 2013-05-18 NOTE — Progress Notes (Signed)
Patient on home 02, office visit needed. Appointment available at 1100 with Rito Ehrlich, who agrees to see patient. After appointment, if colonoscopy to be scheduled will need to be done at hospital per [facility] protocol.

## 2013-05-18 NOTE — Patient Instructions (Signed)
You have been scheduled for a colonoscopy with propofol at [facility]. Please follow written instructions given to you at your visit today.  Please pick up your prep kit at the pharmacy within the next 1-3 days. If you use inhalers (even only as needed), please bring them with you on the day of your procedure. Your physician has requested that you go to www.startemmi.com and enter the access code given to you at your visit today. This web site gives a general overview about your procedure. However, you should still follow specific instructions given to you by our office regarding your preparation for the procedure.

## 2013-05-19 ENCOUNTER — Encounter (HOSPITAL_COMMUNITY): Payer: Self-pay | Admitting: *Deleted

## 2013-05-19 DIAGNOSIS — Z9981 Dependence on supplemental oxygen: Secondary | ICD-10-CM

## 2013-05-19 HISTORY — DX: Dependence on supplemental oxygen: Z99.81

## 2013-05-23 NOTE — Progress Notes (Signed)
Agree with initial assessment and plans 

## 2013-06-06 ENCOUNTER — Encounter (HOSPITAL_COMMUNITY): Payer: Self-pay | Admitting: Pharmacy Technician

## 2013-06-08 ENCOUNTER — Encounter: Payer: Self-pay | Admitting: Internal Medicine

## 2013-06-12 ENCOUNTER — Encounter (HOSPITAL_COMMUNITY): Payer: Medicare Other | Admitting: Anesthesiology

## 2013-06-12 ENCOUNTER — Encounter (HOSPITAL_COMMUNITY)
Admission: RE | Disposition: A | Discharge status: Home / Self Care | Payer: Self-pay | Source: Ambulatory Visit | Attending: Internal Medicine

## 2013-06-12 ENCOUNTER — Ambulatory Visit (HOSPITAL_COMMUNITY): Payer: Medicare Other | Admitting: Anesthesiology

## 2013-06-12 ENCOUNTER — Encounter (HOSPITAL_COMMUNITY): Payer: Self-pay

## 2013-06-12 ENCOUNTER — Ambulatory Visit (HOSPITAL_COMMUNITY)
Admission: RE | Admit: 2013-06-12 | Discharge: 2013-06-12 | Disposition: A | Discharge status: Home / Self Care | Payer: Medicare Other | Source: Ambulatory Visit | Attending: Internal Medicine | Admitting: Internal Medicine

## 2013-06-12 DIAGNOSIS — K648 Other hemorrhoids: Secondary | ICD-10-CM | POA: Insufficient documentation

## 2013-06-12 DIAGNOSIS — Z1211 Encounter for screening for malignant neoplasm of colon: Secondary | ICD-10-CM | POA: Insufficient documentation

## 2013-06-12 HISTORY — DX: Dependence on supplemental oxygen: Z99.81

## 2013-06-12 HISTORY — PX: COLONOSCOPY: SHX5424

## 2013-06-12 HISTORY — DX: Essential (primary) hypertension: I10

## 2013-06-12 HISTORY — DX: Unspecified asthma, uncomplicated: J45.909

## 2013-06-12 SURGERY — COLONOSCOPY
Anesthesia: Monitor Anesthesia Care

## 2013-06-12 MED ORDER — PROPOFOL 10 MG/ML IV BOLUS
INTRAVENOUS | Status: DC | PRN
  Administered 2013-06-12: 50 mg via INTRAVENOUS

## 2013-06-12 MED ORDER — LIDOCAINE HCL (CARDIAC) 20 MG/ML IV SOLN
INTRAVENOUS | Status: DC | PRN
  Administered 2013-06-12: 50 mg via INTRAVENOUS

## 2013-06-12 MED ORDER — SODIUM CHLORIDE 0.9 % IV SOLN: INTRAVENOUS | Status: DC

## 2013-06-12 MED ORDER — PROPOFOL 10 MG/ML IV BOLUS
INTRAVENOUS | Status: AC
  Filled 2013-06-12: qty 20

## 2013-06-12 MED ORDER — LACTATED RINGERS IV SOLN
INTRAVENOUS | Status: DC | PRN
  Administered 2013-06-12: 14:00:00 via INTRAVENOUS

## 2013-06-12 MED ORDER — KETAMINE HCL 50 MG/ML IJ SOLN
INTRAMUSCULAR | Status: AC
  Filled 2013-06-12: qty 10

## 2013-06-12 MED ORDER — KETAMINE HCL 10 MG/ML IJ SOLN
INTRAMUSCULAR | Status: DC | PRN
  Administered 2013-06-12: 20 mg via INTRAVENOUS
  Administered 2013-06-12 (×3): 10 mg via INTRAVENOUS

## 2013-06-12 MED ORDER — LIDOCAINE HCL (CARDIAC) 20 MG/ML IV SOLN
INTRAVENOUS | Status: AC
  Filled 2013-06-12: qty 5

## 2013-06-12 MED ORDER — PROPOFOL INFUSION 10 MG/ML OPTIME
INTRAVENOUS | Status: DC | PRN
  Administered 2013-06-12: 120 ug/kg/min via INTRAVENOUS

## 2013-06-12 NOTE — Anesthesia Preprocedure Evaluation (Signed)
Anesthesia Evaluation  Patient identified by MRN, date of birth, ID band Patient awake    Reviewed: Allergy & Precautions, H&P , NPO status , Patient's Chart, lab work & pertinent test results  Airway Mallampati: II TM Distance: >3 FB Neck ROM: Full    Dental no notable dental hx.    Pulmonary asthma , sleep apnea and Oxygen sleep apnea , former smoker,  breath sounds clear to auscultation  Pulmonary exam normal       Cardiovascular hypertension, Pt. on medications and Pt. on home beta blockers Rhythm:Regular Rate:Normal     Neuro/Psych MS (multiple sclerosis)   remission at present(past hx. bedridden) -ambulates with cane-left side weaker  negative psych ROS   GI/Hepatic negative GI ROS, Neg liver ROS,   Endo/Other  Morbid obesity  Renal/GU negative Renal ROS  negative genitourinary   Musculoskeletal negative musculoskeletal ROS (+)   Abdominal   Peds negative pediatric ROS (+)  Hematology negative hematology ROS (+)   Anesthesia Other Findings   Reproductive/Obstetrics negative OB ROS                           Anesthesia Physical Anesthesia Plan  ASA: III  Anesthesia Plan: MAC   Post-op Pain Management:    Induction: Intravenous  Airway Management Planned: Nasal Cannula  Additional Equipment:   Intra-op Plan:   Post-operative Plan:   Informed Consent: I have reviewed the patients History and Physical, chart, labs and discussed the procedure including the risks, benefits and alternatives for the proposed anesthesia with the patient or authorized representative who has indicated his/her understanding and acceptance.   Dental advisory given  Plan Discussed with: CRNA and Surgeon  Anesthesia Plan Comments:         Anesthesia Quick Evaluation

## 2013-06-12 NOTE — Transfer of Care (Signed)
Immediate Anesthesia Transfer of Care Note  Patient: [name]  Procedure(s) Performed: Procedure(s): COLONOSCOPY (N/A)  Patient Location: PACU  Anesthesia Type:MAC  Level of Consciousness: awake, alert  and oriented  Airway & Oxygen Therapy: Patient Spontanous Breathing and Patient connected to face mask oxygen  Post-op Assessment: Report given to PACU RN and Post -op Vital signs reviewed and stable  Post vital signs: Reviewed and stable  Complications: No apparent anesthesia complications

## 2013-06-12 NOTE — Anesthesia Postprocedure Evaluation (Signed)
  Anesthesia Post-op Note  Patient: [name]  Procedure(s) Performed: Procedure(s) (LRB): COLONOSCOPY (N/A)  Patient Location: PACU  Anesthesia Type: MAC  Level of Consciousness: awake and alert   Airway and Oxygen Therapy: Patient Spontanous Breathing  Post-op Pain: mild  Post-op Assessment: Post-op Vital signs reviewed, Patient's Cardiovascular Status Stable, Respiratory Function Stable, Patent Airway and No signs of Nausea or vomiting  Last Vitals:  Filed Vitals:   06/12/13 1500  BP: 137/78  Temp:   Resp: 12    Post-op Vital Signs: stable   Complications: No apparent anesthesia complications

## 2013-06-12 NOTE — H&P (View-Only) (Signed)
Agree with initial assessment and plans 

## 2013-06-12 NOTE — Interval H&P Note (Signed)
History and Physical Interval Note:  No interval changes. For colonoscopy  06/12/2013 2:05 PM  [name]  has presented today for surgery, with the diagnosis of Special screening for malignant neoplasms, colon [V76.51]  The various methods of treatment have been discussed with the patient and family. After consideration of risks, benefits and other options for treatment, the patient has consented to  Procedure(s): COLONOSCOPY (N/A) as a surgical intervention .  The patient's history has been reviewed, patient examined, no change in status, stable for surgery.  I have reviewed the patient's chart and labs.  Questions were answered to the patient's satisfaction.     Yancey Flemings

## 2013-06-12 NOTE — Op Note (Signed)
[facility] [address] [place], [address]   COLONOSCOPY PROCEDURE REPORT  PATIENT: [name], [name]  MR#: [id number] BIRTHDATE: [birth year] , 52  yrs. old GENDER: Female ENDOSCOPIST: Roxy Cedar, MD REFERRED BY:.  Self / Office PROCEDURE DATE:  06/12/2013 PROCEDURE:   Colonoscopy, screening First Screening Colonoscopy - Avg.  risk and is 50 yrs.  old or older Yes.  Prior Negative Screening - Now for repeat screening. N/A  History of Adenoma - Now for follow-up colonoscopy & has been > or = to 3 yrs.  N/A  Polyps Removed Today? No.  Recommend repeat exam, <10 yrs? No. ASA CLASS:   Class II INDICATIONS:average risk screening. MEDICATIONS: MAC sedation, administered by CRNA and See Anesthesia Report.  DESCRIPTION OF PROCEDURE:   After the risks benefits and alternatives of the procedure were thoroughly explained, informed consent was obtained.  A digital rectal exam revealed no abnormalities of the rectum.   The Pentax Colonoscope [id number] endoscope was introduced through the anus and advanced to the cecum, which was identified by both the appendix and ileocecal valve. No adverse events experienced.   The quality of the prep was good, using MoviPrep  The instrument was then slowly withdrawn as the colon was fully examined.      COLON FINDINGS: A normal appearing cecum, ileocecal valve, and appendiceal orifice were identified.  The ascending, hepatic flexure, transverse, splenic flexure, descending, sigmoid colon and rectum appeared unremarkable.  No polyps or cancers were seen. Retroflexed views revealed internal hemorrhoids. The time to cecum=7 minutes 0 seconds.  Withdrawal time=13 minutes 0 seconds. The scope was withdrawn and the procedure completed. COMPLICATIONS: There were no complications.  ENDOSCOPIC IMPRESSION: 1. Normal colon  RECOMMENDATIONS: 1. Continue current colorectal screening recommendations for "routine risk" patients with a  repeat colonoscopy in 10 years.   eSigned:  Roxy Cedar, MD 06/12/2013 2:39 PM   cc: The Patient, Billee Cashing MD, and Leonard Schwartz, MD

## 2013-06-14 ENCOUNTER — Encounter (HOSPITAL_COMMUNITY): Payer: Self-pay | Admitting: Internal Medicine

## 2013-08-26 ENCOUNTER — Emergency Department (HOSPITAL_COMMUNITY)
Admission: EM | Admit: 2013-08-26 | Discharge: 2013-08-26 | Disposition: A | Discharge status: Home / Self Care | Payer: Medicare Other | Attending: Emergency Medicine | Admitting: Emergency Medicine

## 2013-08-26 ENCOUNTER — Encounter (HOSPITAL_COMMUNITY): Payer: Self-pay | Admitting: Emergency Medicine

## 2013-08-26 DIAGNOSIS — M129 Arthropathy, unspecified: Secondary | ICD-10-CM | POA: Insufficient documentation

## 2013-08-26 DIAGNOSIS — F411 Generalized anxiety disorder: Secondary | ICD-10-CM | POA: Insufficient documentation

## 2013-08-26 DIAGNOSIS — Z3202 Encounter for pregnancy test, result negative: Secondary | ICD-10-CM | POA: Insufficient documentation

## 2013-08-26 DIAGNOSIS — IMO0002 Reserved for concepts with insufficient information to code with codable children: Secondary | ICD-10-CM | POA: Insufficient documentation

## 2013-08-26 DIAGNOSIS — Z79899 Other long term (current) drug therapy: Secondary | ICD-10-CM | POA: Insufficient documentation

## 2013-08-26 DIAGNOSIS — Z880 Allergy status to penicillin: Secondary | ICD-10-CM | POA: Insufficient documentation

## 2013-08-26 DIAGNOSIS — Z91040 Latex allergy status: Secondary | ICD-10-CM | POA: Insufficient documentation

## 2013-08-26 DIAGNOSIS — G40909 Epilepsy, unspecified, not intractable, without status epilepticus: Secondary | ICD-10-CM | POA: Insufficient documentation

## 2013-08-26 DIAGNOSIS — Z9981 Dependence on supplemental oxygen: Secondary | ICD-10-CM | POA: Insufficient documentation

## 2013-08-26 DIAGNOSIS — I10 Essential (primary) hypertension: Secondary | ICD-10-CM | POA: Insufficient documentation

## 2013-08-26 DIAGNOSIS — R569 Unspecified convulsions: Secondary | ICD-10-CM

## 2013-08-26 DIAGNOSIS — F319 Bipolar disorder, unspecified: Secondary | ICD-10-CM | POA: Insufficient documentation

## 2013-08-26 DIAGNOSIS — Z87891 Personal history of nicotine dependence: Secondary | ICD-10-CM | POA: Insufficient documentation

## 2013-08-26 DIAGNOSIS — Z862 Personal history of diseases of the blood and blood-forming organs and certain disorders involving the immune mechanism: Secondary | ICD-10-CM | POA: Insufficient documentation

## 2013-08-26 DIAGNOSIS — R51 Headache: Secondary | ICD-10-CM | POA: Insufficient documentation

## 2013-08-26 DIAGNOSIS — J45909 Unspecified asthma, uncomplicated: Secondary | ICD-10-CM | POA: Insufficient documentation

## 2013-08-26 DIAGNOSIS — K219 Gastro-esophageal reflux disease without esophagitis: Secondary | ICD-10-CM | POA: Insufficient documentation

## 2013-08-26 DIAGNOSIS — IMO0001 Reserved for inherently not codable concepts without codable children: Secondary | ICD-10-CM | POA: Insufficient documentation

## 2013-08-26 LAB — BASIC METABOLIC PANEL
BUN: 11 mg/dL (ref 6–23)
CO2: 26 mEq/L (ref 19–32)
Calcium: 9.5 mg/dL (ref 8.4–10.5)
Chloride: 101 mEq/L (ref 96–112)
Creatinine, Ser: 0.84 mg/dL (ref 0.50–1.10)
GFR calc Af Amer: >90 mL/min (ref >90)
GFR calc non Af Amer: 79 mL/min — ABNORMAL LOW (ref >90)
Glucose, Bld: 100 mg/dL — ABNORMAL HIGH (ref 70–99)
Potassium: 3.6 mEq/L — ABNORMAL LOW (ref 3.7–5.3)
Sodium: 140 mEq/L (ref 137–147)

## 2013-08-26 LAB — PREGNANCY, URINE: Preg Test, Ur: NEGATIVE

## 2013-08-26 MED ORDER — KETOROLAC TROMETHAMINE 30 MG/ML IJ SOLN
30.0000 mg | Freq: Once | INTRAMUSCULAR | Status: AC
  Administered 2013-08-26: 30 mg via INTRAVENOUS
  Filled 2013-08-26: qty 1

## 2013-08-26 MED ORDER — LEVETIRACETAM 500 MG PO TABS: 500.0000 mg | ORAL_TABLET | Freq: Two times a day (BID) | ORAL | Status: DC

## 2013-08-26 MED ORDER — SODIUM CHLORIDE 0.9 % IV SOLN
1000.0000 mg | Freq: Once | INTRAVENOUS | Status: AC
  Administered 2013-08-26: 1000 mg via INTRAVENOUS
  Filled 2013-08-26: qty 10

## 2013-08-26 MED ORDER — LORAZEPAM 1 MG PO TABS
1.0000 mg | ORAL_TABLET | Freq: Once | ORAL | Status: AC
  Administered 2013-08-26: 1 mg via ORAL
  Filled 2013-08-26: qty 1

## 2013-08-26 NOTE — ED Notes (Signed)
Patient presents to ED via [facility]. Patient states, "I think I had a seizure." episode was un witnessed- patient states that she had a "seizure" in December but was not placed on any medications. Patient states that she remembers feeling "funny" walking back from the restroom and the next thing she knew, she was "half on the bed and half off." Pt pushed her medic alert button.  EMS states that patient was A&Ox4 upon their arrival and npt post-ictal. A&Ox4 upon arrival to ED. C/o of "headache." No acute distress noted.

## 2013-08-26 NOTE — ED Provider Notes (Signed)
CSN: [id number]     Arrival date & time 08/26/13  0026 History   First MD Initiated Contact with Patient 08/26/13 0032     Chief Complaint  Patient presents with  . Seizures     (Consider location/radiation/quality/duration/timing/severity/associated sxs/prior Treatment) HPI Comments: 53 year old female, history of seizure disorder as a child, took phenobarbital until age 74, also has a history of multiple sclerosis that has been confirmed in the past and with whom she follows with neurology. She does not have any recent findings on MRI from 2012 to suggest acute disease. She states that while she was ambulating from her bathroom to her bedroom this evening she developed acute onset of mild vertigo feeling like the room is spinning, felt as if that she was going to pass out and remembers climbing onto the bed. She woke up a short time later on the bed stating that she was "half on and half off" the bed. She also had a severe headache when she awoke, she had difficulty using the phone stating that she could not remember phone numbers and had difficulty with manual dexterity that would normally come easy to her. She also complained of her jaw hurting at that time feeling as though she had been clenching her jaw. She denies any tongue biting or urinary incontinence. She does have a history of occasional headaches, she has photophobia with this headache and mild nausea. She denies focal weakness or numbness at this time to me. She was recently admitted to a hospital in [place] within the last 2 months during which time she had multiple seizures before and during her hospitalization which were thought to be due to Cymbalta at that time. This medication was discontinued, she has not been taking it. She does not currently take any antiseizure medications, she does not drink alcohol, she does not use benzodiazepines regularly but takes occasional Xanax or Valium. It is known that she has a significant history of  bipolar disorder and does take Lamictal for this. She does not feel as though she is acutely manic at this time.  Patient is a 53 y.o. female presenting with seizures. The history is provided by the patient, a relative and the EMS personnel.  Seizures   Past Medical History  Diagnosis Date  . Anemia   . Anxiety state, unspecified   . Backache, unspecified   . Depressive disorder, not elsewhere classified   . Esophageal reflux   . Other abnormal glucose     05-19-13 no problems now  . Fibromyalgia   . Arthropathy, unspecified, site unspecified   . MS (multiple sclerosis)     remission at present(past hx. bedridden) -ambulates with cane-left side weaker  . Arthritis   . Hypertension   . Obstructive sleep apnea (adult) (pediatric)     no cpap-uses Oxygen nightly 2l/m   . On home oxygen therapy 05-19-13    nightly at 2l/m  . Asthma    Past Surgical History  Procedure Laterality Date  . Appendectomy    . Cholecystectomy    . Tubal ligation    . Vaginal hysterectomy    . Hernia repair      umbilical  . Cesarean section  1988    twins  . Colonoscopy N/A 06/12/2013    Procedure: COLONOSCOPY;  Surgeon: Irene Shipper, MD;  Location: [facility];  Service: Endoscopy;  Laterality: N/A;   Family History  Problem Relation Age of Onset  . Ovarian cancer Mother   . Heart disease  Father   . Colon cancer Neg Hx   . Esophageal cancer Neg Hx   . Stomach cancer Neg Hx   . Rectal cancer Neg Hx    History  Substance Use Topics  . Smoking status: Former Smoker -- 1.00 packs/day for 5 years    Quit date: 07/07/2007  . Smokeless tobacco: Never Used  . Alcohol Use: No   OB History   Grav Para Term Preterm Abortions TAB SAB Ect Mult Living   2 2 1 1     1 3      Review of Systems  Neurological: Positive for seizures.  All other systems reviewed and are negative.      Allergies  Depakote; Latex; Peanut-containing drug products; Penicillins; Seroquel; Shellfish-derived products;  Amitriptyline hcl; Bee venom; Butorphanol tartrate; and Gabapentin  Home Medications   Current Outpatient Rx  Name  Route  Sig  Dispense  Refill  . albuterol (PROVENTIL HFA;VENTOLIN HFA) 108 (90 BASE) MCG/ACT inhaler   Inhalation   Inhale 2 puffs into the lungs every 4 (four) hours as needed for wheezing or shortness of breath.         . ALPRAZolam (XANAX) 0.5 MG tablet   Oral   Take 1 tablet by mouth 3 (three) times daily as needed for anxiety or sleep.          . cyclobenzaprine (FLEXERIL) 10 MG tablet   Oral   Take 10 mg by mouth 3 (three) times daily as needed for muscle spasms.         . famotidine (PEPCID) 20 MG tablet   Oral   Take 1 tablet (20 mg total) by mouth 2 (two) times daily. For stomach acid.   60 tablet   0   . Fluticasone-Salmeterol (ADVAIR DISKUS) 500-50 MCG/DOSE AEPB   Inhalation   Inhale 1 puff into the lungs every 12 (twelve) hours. For asthma.   60 each   0   . GARCINIA CAMBOGIA-CHROMIUM PO   Oral   Take 1 capsule by mouth daily.         Marland Kitchen HYDROcodone-acetaminophen (NORCO) 10-325 MG per tablet   Oral   Take 1 tablet by mouth every 6 (six) hours as needed for moderate pain or severe pain.          Marland Kitchen lamoTRIgine (LAMICTAL) 100 MG tablet   Oral   Take 100 mg by mouth at bedtime.         Marland Kitchen loratadine (CLARITIN) 10 MG tablet   Oral   Take 10 mg by mouth daily as needed for allergies.         . metoprolol succinate (TOPROL-XL) 50 MG 24 hr tablet   Oral   Take 50 mg by mouth at bedtime. Take with or immediately following a meal for blood pressure control.         . montelukast (SINGULAIR) 10 MG tablet   Oral   Take 10 mg by mouth at bedtime.         . Multiple Vitamin (MULTIVITAMIN WITH MINERALS) TABS   Oral   Take 1 tablet by mouth daily. For nutritional supplementation.   30 tablet   0   . EPINEPHrine (EPI-PEN) 0.3 mg/0.3 mL DEVI   Intramuscular   Inject 0.3 mg into the muscle once. As needed for severe allergic  reaction         . levETIRAcetam (KEPPRA) 500 MG tablet   Oral   Take 1 tablet (500 mg total) by mouth  2 (two) times daily.   60 tablet   1    BP 128/82  Pulse 89  Temp(Src) 98.4 F (36.9 C) (Oral)  Resp 19  Ht 5\' 9"  (1.753 m)  Wt 240 lb (108.863 kg)  BMI 35.43 kg/m2  SpO2 95% Physical Exam  Nursing note and vitals reviewed. Constitutional: She appears well-developed and well-nourished. No distress.  HENT:  Head: Normocephalic and atraumatic.  Mouth/Throat: Oropharynx is clear and moist. No oropharyngeal exudate.  Eyes: Conjunctivae and EOM are normal. Pupils are equal, round, and reactive to light. Right eye exhibits no discharge. Left eye exhibits no discharge. No scleral icterus.  Neck: Normal range of motion. Neck supple. No JVD present. No thyromegaly present.  Cardiovascular: Normal rate, regular rhythm, normal heart sounds and intact distal pulses.  Exam reveals no gallop and no friction rub.   No murmur heard. Pulmonary/Chest: Effort normal and breath sounds normal. No respiratory distress. She has no wheezes. She has no rales.  Abdominal: Soft. Bowel sounds are normal. She exhibits no distension and no mass. There is no tenderness.  Musculoskeletal: Normal range of motion. She exhibits no edema and no tenderness.  Lymphadenopathy:    She has no cervical adenopathy.  Neurological: She is alert. Coordination normal.  Speech is clear, she is alert and oriented to person place and time, she follows commands without any difficulty and has normal strength and sensation of all 4 extremities, cranial nerves III through XII are intact, visual acuity is difficult to test as the patient has significant photophobia. Coordination is normal  Skin: Skin is warm and dry. No rash noted. No erythema.  Psychiatric: She has a normal mood and affect. Her behavior is normal.    ED Course  Procedures (including critical care time) Labs Review Labs Reviewed  BASIC METABOLIC PANEL -  Abnormal; Notable for the following:    Potassium 3.6 (*)    Glucose, Bld 100 (*)    GFR calc non Af Amer 79 (*)    All other components within normal limits  PREGNANCY, URINE   Imaging Review No results found.  EKG Interpretation   None       MDM   Final diagnoses:  Seizure    The patient has vital signs which are unremarkable, there is no tachycardia hypotension or fever. She does not have a stiff neck, she does not have any focal neurologic deficits and the only symptom she is having at this time as her persistent headache. I will give her pain medication, Ativan to prevent further seizures but at this time with 2 discrete episodes of seizures over the last several months it would likely benefit her to start on an antiepileptic medication. She states that she has Artie scheduled a followup for march for an EEG. This does not appear to be benzodiazepine withdrawal, she has not had any recent head injuries and she does have a history of seizures and both herself and her family member.  Pt has had no further seizures in the ED - she has normal MS - has been given meds as below and has been informed of her treatment course - she is in agreement.  Meds given in ED:  Medications  LORazepam (ATIVAN) tablet 1 mg (1 mg Oral Given 08/26/13 0122)  ketorolac (TORADOL) 30 MG/ML injection 30 mg (30 mg Intravenous Given 08/26/13 0122)  levETIRAcetam (KEPPRA) 1,000 mg in sodium chloride 0.9 % 100 mL IVPB (0 mg Intravenous Stopped 08/26/13 0203)    New  Prescriptions   LEVETIRACETAM (KEPPRA) 500 MG TABLET    Take 1 tablet (500 mg total) by mouth 2 (two) times daily.      Johnna Acosta, MD 08/26/13 [phone]

## 2013-08-26 NOTE — Discharge Instructions (Signed)
Your testing was normal, please follow up for your EEG in march - if you don't have an appointment scheduled you should call today to make the appointment.    Take Keppra twice daily to prevent further seizures.  You may have breakthrough seizures even though you are taking the medicines - if this happens, you may return to the ER for further evaluation.  Please call your doctor for a followup appointment within 24-48 hours. When you talk to your doctor please let them know that you were seen in the emergency department and have them acquire all of your records so that they can discuss the findings with you and formulate a treatment plan to fully care for your new and ongoing problems.

## 2013-09-05 ENCOUNTER — Emergency Department (HOSPITAL_COMMUNITY)
Admission: EM | Admit: 2013-09-05 | Discharge: 2013-09-05 | Disposition: A | Discharge status: Home / Self Care | Payer: Medicare Other | Attending: Emergency Medicine | Admitting: Emergency Medicine

## 2013-09-05 ENCOUNTER — Encounter (HOSPITAL_COMMUNITY): Payer: Self-pay | Admitting: Emergency Medicine

## 2013-09-05 DIAGNOSIS — F3289 Other specified depressive episodes: Secondary | ICD-10-CM | POA: Insufficient documentation

## 2013-09-05 DIAGNOSIS — G35 Multiple sclerosis: Secondary | ICD-10-CM | POA: Insufficient documentation

## 2013-09-05 DIAGNOSIS — IMO0002 Reserved for concepts with insufficient information to code with codable children: Secondary | ICD-10-CM | POA: Insufficient documentation

## 2013-09-05 DIAGNOSIS — K219 Gastro-esophageal reflux disease without esophagitis: Secondary | ICD-10-CM | POA: Insufficient documentation

## 2013-09-05 DIAGNOSIS — E669 Obesity, unspecified: Secondary | ICD-10-CM | POA: Insufficient documentation

## 2013-09-05 DIAGNOSIS — F329 Major depressive disorder, single episode, unspecified: Secondary | ICD-10-CM | POA: Insufficient documentation

## 2013-09-05 DIAGNOSIS — Z862 Personal history of diseases of the blood and blood-forming organs and certain disorders involving the immune mechanism: Secondary | ICD-10-CM | POA: Insufficient documentation

## 2013-09-05 DIAGNOSIS — G43909 Migraine, unspecified, not intractable, without status migrainosus: Secondary | ICD-10-CM | POA: Insufficient documentation

## 2013-09-05 DIAGNOSIS — G40909 Epilepsy, unspecified, not intractable, without status epilepticus: Secondary | ICD-10-CM | POA: Insufficient documentation

## 2013-09-05 DIAGNOSIS — Z79899 Other long term (current) drug therapy: Secondary | ICD-10-CM | POA: Insufficient documentation

## 2013-09-05 DIAGNOSIS — Z91040 Latex allergy status: Secondary | ICD-10-CM | POA: Insufficient documentation

## 2013-09-05 DIAGNOSIS — G4733 Obstructive sleep apnea (adult) (pediatric): Secondary | ICD-10-CM | POA: Insufficient documentation

## 2013-09-05 DIAGNOSIS — Z9981 Dependence on supplemental oxygen: Secondary | ICD-10-CM | POA: Insufficient documentation

## 2013-09-05 DIAGNOSIS — J45909 Unspecified asthma, uncomplicated: Secondary | ICD-10-CM | POA: Insufficient documentation

## 2013-09-05 DIAGNOSIS — Z87891 Personal history of nicotine dependence: Secondary | ICD-10-CM | POA: Insufficient documentation

## 2013-09-05 DIAGNOSIS — M129 Arthropathy, unspecified: Secondary | ICD-10-CM | POA: Insufficient documentation

## 2013-09-05 DIAGNOSIS — F411 Generalized anxiety disorder: Secondary | ICD-10-CM | POA: Insufficient documentation

## 2013-09-05 DIAGNOSIS — IMO0001 Reserved for inherently not codable concepts without codable children: Secondary | ICD-10-CM | POA: Insufficient documentation

## 2013-09-05 DIAGNOSIS — Z880 Allergy status to penicillin: Secondary | ICD-10-CM | POA: Insufficient documentation

## 2013-09-05 DIAGNOSIS — I10 Essential (primary) hypertension: Secondary | ICD-10-CM | POA: Insufficient documentation

## 2013-09-05 HISTORY — DX: Unspecified convulsions: R56.9

## 2013-09-05 MED ORDER — DIPHENHYDRAMINE HCL 50 MG/ML IJ SOLN
50.0000 mg | Freq: Once | INTRAMUSCULAR | Status: AC
  Administered 2013-09-05: 50 mg via INTRAVENOUS
  Filled 2013-09-05: qty 1

## 2013-09-05 MED ORDER — HYDROMORPHONE HCL PF 1 MG/ML IJ SOLN
1.0000 mg | Freq: Once | INTRAMUSCULAR | Status: AC
  Administered 2013-09-05: 1 mg via INTRAVENOUS
  Filled 2013-09-05: qty 1

## 2013-09-05 MED ORDER — PROCHLORPERAZINE EDISYLATE 5 MG/ML IJ SOLN
10.0000 mg | Freq: Once | INTRAMUSCULAR | Status: AC
  Administered 2013-09-05: 10 mg via INTRAVENOUS
  Filled 2013-09-05: qty 2

## 2013-09-05 NOTE — ED Notes (Addendum)
At 4 am had a h/a  And left side of fgace feels numb  Has meds vicodin and then tylenol and head still hurts    No slurred speech and nither arms or legs are weak just left side of face has no facial droop has new onset sz since dec states vomited x 1 also at 0730

## 2013-09-05 NOTE — Discharge Instructions (Signed)
Migraine Headache A migraine headache is an intense, throbbing pain on one or both sides of your head. A migraine can last for 30 minutes to several hours. CAUSES  The exact cause of a migraine headache is not always known. However, a migraine may be caused when nerves in the brain become irritated and release chemicals that cause inflammation. This causes pain. Certain things may also trigger migraines, such as:  Alcohol.  Smoking.  Stress.  Menstruation.  Aged cheeses.  Foods or drinks that contain nitrates, glutamate, aspartame, or tyramine.  Lack of sleep.  Chocolate.  Caffeine.  Hunger.  Physical exertion.  Fatigue.  Medicines used to treat chest pain (nitroglycerine), birth control pills, estrogen, and some blood pressure medicines. SIGNS AND SYMPTOMS  Pain on one or both sides of your head.  Pulsating or throbbing pain.  Severe pain that prevents daily activities.  Pain that is aggravated by any physical activity.  Nausea, vomiting, or both.  Dizziness.  Pain with exposure to bright lights, loud noises, or activity.  General sensitivity to bright lights, loud noises, or smells. Before you get a migraine, you may get warning signs that a migraine is coming (aura). An aura may include:  Seeing flashing lights.  Seeing bright spots, halos, or zig-zag lines.  Having tunnel vision or blurred vision.  Having feelings of numbness or tingling.  Having trouble talking.  Having muscle weakness. DIAGNOSIS  A migraine headache is often diagnosed based on:  Symptoms.  Physical exam.  A CT scan or MRI of your head. These imaging tests cannot diagnose migraines, but they can help rule out other causes of headaches. TREATMENT Medicines may be given for pain and nausea. Medicines can also be given to help prevent recurrent migraines.  HOME CARE INSTRUCTIONS  Only take over-the-counter or prescription medicines for pain or discomfort as directed by your  health care provider. The use of long-term narcotics is not recommended.  Lie down in a dark, quiet room when you have a migraine.  Keep a journal to find out what may trigger your migraine headaches. For example, write down:  What you eat and drink.  How much sleep you get.  Any change to your diet or medicines.  Limit alcohol consumption.  Quit smoking if you smoke.  Get 7 9 hours of sleep, or as recommended by your health care provider.  Limit stress.  Keep lights dim if bright lights bother you and make your migraines worse. SEEK IMMEDIATE MEDICAL CARE IF:   Your migraine becomes severe.  You have a fever.  You have a stiff neck.  You have vision loss.  You have muscular weakness or loss of muscle control.  You start losing your balance or have trouble walking.  You feel faint or pass out.  You have severe symptoms that are different from your first symptoms. MAKE SURE YOU:   Understand these instructions.  Will watch your condition.  Will get help right away if you are not doing well or get worse. Document Released: 06/22/2005 Document Revised: 04/12/2013 Document Reviewed: 02/27/2013 [employer] Patient Information 2014 [employer].  

## 2013-09-05 NOTE — ED Provider Notes (Signed)
CSN: [id number]     Arrival date & time 09/05/13  1139 History   First MD Initiated Contact with Patient 09/05/13 1159     No chief complaint on file.  chief complaint migraine headache  (Consider location/radiation/quality/duration/timing/severity/associated sxs/prior Treatment) HPI Place of left-sided throbbing headache gradual onset 3:30 AM today typical migraine she's had in the past accompanied symptoms include nausea,. she's vomited one time, photophobia And numbness on the left side of her face. All associated symptoms are typical of migraines she's had since her 48s. She treated herself with hydrocodone, without relief. No other associated symptoms. Past Medical History  Diagnosis Date  . Anemia   . Anxiety state, unspecified   . Backache, unspecified   . Depressive disorder, not elsewhere classified   . Esophageal reflux   . Other abnormal glucose     05-19-13 no problems now  . Fibromyalgia   . Arthropathy, unspecified, site unspecified   . MS (multiple sclerosis)     remission at present(past hx. bedridden) -ambulates with cane-left side weaker  . Arthritis   . Hypertension   . Obstructive sleep apnea (adult) (pediatric)     no cpap-uses Oxygen nightly 2l/m   . On home oxygen therapy 05-19-13    nightly at 2l/m  . Asthma   . Seizures    depression Past Surgical History  Procedure Laterality Date  . Appendectomy    . Cholecystectomy    . Tubal ligation    . Vaginal hysterectomy    . Hernia repair      umbilical  . Cesarean section  1988    twins  . Colonoscopy N/A 06/12/2013    Procedure: COLONOSCOPY;  Surgeon: Irene Shipper, MD;  Location: [facility];  Service: Endoscopy;  Laterality: N/A;   Family History  Problem Relation Age of Onset  . Ovarian cancer Mother   . Heart disease Father   . Colon cancer Neg Hx   . Esophageal cancer Neg Hx   . Stomach cancer Neg Hx   . Rectal cancer Neg Hx    History  Substance Use Topics  . Smoking status: Former  Smoker -- 1.00 packs/day for 5 years    Quit date: 07/07/2007  . Smokeless tobacco: Never Used  . Alcohol Use: No   OB History   Grav Para Term Preterm Abortions TAB SAB Ect Mult Living   2 2 1 1     1 3      Review of Systems  Constitutional: Negative.   Eyes: Positive for photophobia.  Respiratory: Negative.   Cardiovascular: Negative.   Gastrointestinal: Positive for nausea and vomiting.  Musculoskeletal: Negative.   Skin: Negative.   Neurological: Positive for numbness and headaches.  Psychiatric/Behavioral: Negative.       Allergies  Depakote; Latex; Peanut-containing drug products; Penicillins; Seroquel; Shellfish-derived products; Amitriptyline hcl; Bee venom; Butorphanol tartrate; and Gabapentin  Home Medications   Current Outpatient Rx  Name  Route  Sig  Dispense  Refill  . albuterol (PROVENTIL HFA;VENTOLIN HFA) 108 (90 BASE) MCG/ACT inhaler   Inhalation   Inhale 2 puffs into the lungs every 4 (four) hours as needed for wheezing or shortness of breath.         . ALPRAZolam (XANAX) 0.5 MG tablet   Oral   Take 1 tablet by mouth 3 (three) times daily as needed for anxiety or sleep.          . cyclobenzaprine (FLEXERIL) 10 MG tablet   Oral   Take 10  mg by mouth 3 (three) times daily as needed for muscle spasms.         Marland Kitchen EPINEPHrine (EPI-PEN) 0.3 mg/0.3 mL DEVI   Intramuscular   Inject 0.3 mg into the muscle once. As needed for severe allergic reaction         . famotidine (PEPCID) 20 MG tablet   Oral   Take 1 tablet (20 mg total) by mouth 2 (two) times daily. For stomach acid.   60 tablet   0   . Fluticasone-Salmeterol (ADVAIR DISKUS) 500-50 MCG/DOSE AEPB   Inhalation   Inhale 1 puff into the lungs every 12 (twelve) hours. For asthma.   60 each   0   . HYDROcodone-acetaminophen (NORCO) 10-325 MG per tablet   Oral   Take 1 tablet by mouth every 6 (six) hours as needed for moderate pain or severe pain.          Marland Kitchen lamoTRIgine (LAMICTAL) 25 MG  tablet   Oral   Take 75 mg by mouth 2 (two) times daily.         Marland Kitchen levETIRAcetam (KEPPRA) 500 MG tablet   Oral   Take 1 tablet (500 mg total) by mouth 2 (two) times daily.   60 tablet   1   . loratadine (CLARITIN) 10 MG tablet   Oral   Take 10 mg by mouth daily as needed for allergies.         . metoprolol succinate (TOPROL-XL) 50 MG 24 hr tablet   Oral   Take 50 mg by mouth at bedtime. Take with or immediately following a meal for blood pressure control.         . montelukast (SINGULAIR) 10 MG tablet   Oral   Take 10 mg by mouth at bedtime.         . Multiple Vitamin (MULTIVITAMIN WITH MINERALS) TABS   Oral   Take 1 tablet by mouth daily. For nutritional supplementation.   30 tablet   0   . trifluoperazine (STELAZINE) 2 MG tablet   Oral   Take 2 mg by mouth at bedtime.          BP 135/78  Pulse 90  Temp(Src) 98 F (36.7 C) (Oral)  Resp 18  SpO2 99% Physical Exam  Nursing note and vitals reviewed. Constitutional: She is oriented to person, place, and time. She appears well-developed and well-nourished. No distress.  HENT:  Head: Normocephalic and atraumatic.  No Facial asymmetry  Eyes: Conjunctivae are normal. Pupils are equal, round, and reactive to light.  Neck: Neck supple. No tracheal deviation present. No thyromegaly present.  Cardiovascular: Normal rate and regular rhythm.   No murmur heard. Pulmonary/Chest: Effort normal and breath sounds normal.  Abdominal: Soft. Bowel sounds are normal. She exhibits no distension. There is no tenderness.  Obese  Musculoskeletal: Normal range of motion. She exhibits no edema and no tenderness.  Neurological: She is alert and oriented to person, place, and time. No cranial nerve deficit. Coordination normal.  Gait normal Romberg normal pronator drift normal motor strength 5 over 5 overall. DTRs symmetric bilaterally knee jerk ankle jerk biceps toes are good bilaterally  Skin: Skin is warm and dry. No rash noted.   Psychiatric: She has a normal mood and affect.    ED Course  Procedures (including critical care time) Labs Review Labs Reviewed - No data to display Imaging Review No results found.   EKG Interpretation None     1340 p.m. patient received  no relief after treatment with Compazine and Benadryl intravenously. Intravenous hydromorphone ordered by me. 1505 p.m. patient feels headache is improved of his radial home. She is alert Glasgow Coma Score 15. MDM   Final diagnoses:  None   Plan return as needed or follow up with her primary care physician Dr. Alyson Ingles Diagnosis migraine headache    Orlie Dakin, MD 09/05/13 1511

## 2013-09-15 ENCOUNTER — Ambulatory Visit: Payer: Self-pay | Admitting: Internal Medicine

## 2013-09-19 ENCOUNTER — Ambulatory Visit: Payer: Self-pay | Admitting: Internal Medicine

## 2013-10-03 DIAGNOSIS — M51379 Other intervertebral disc degeneration, lumbosacral region without mention of lumbar back pain or lower extremity pain: Secondary | ICD-10-CM | POA: Insufficient documentation

## 2013-10-03 DIAGNOSIS — M5137 Other intervertebral disc degeneration, lumbosacral region: Secondary | ICD-10-CM | POA: Insufficient documentation

## 2013-10-03 DIAGNOSIS — E079 Disorder of thyroid, unspecified: Secondary | ICD-10-CM

## 2013-10-03 HISTORY — DX: Other intervertebral disc degeneration, lumbosacral region: M51.37

## 2013-10-03 HISTORY — DX: Disorder of thyroid, unspecified: E07.9

## 2013-10-03 HISTORY — DX: Other intervertebral disc degeneration, lumbosacral region without mention of lumbar back pain or lower extremity pain: M51.379

## 2013-10-04 ENCOUNTER — Other Ambulatory Visit: Payer: Self-pay | Admitting: Obstetrics and Gynecology

## 2013-10-04 DIAGNOSIS — Z1231 Encounter for screening mammogram for malignant neoplasm of breast: Secondary | ICD-10-CM

## 2013-10-09 ENCOUNTER — Ambulatory Visit (INDEPENDENT_AMBULATORY_CARE_PROVIDER_SITE_OTHER): Payer: Medicare Other | Admitting: Neurology

## 2013-10-09 ENCOUNTER — Encounter (INDEPENDENT_AMBULATORY_CARE_PROVIDER_SITE_OTHER): Payer: Self-pay

## 2013-10-09 ENCOUNTER — Encounter: Payer: Self-pay | Admitting: Neurology

## 2013-10-09 VITALS — BP 129/88 | HR 99 | Ht 69.25 in | Wt 256.0 lb

## 2013-10-09 DIAGNOSIS — R569 Unspecified convulsions: Secondary | ICD-10-CM

## 2013-10-09 DIAGNOSIS — G35 Multiple sclerosis: Secondary | ICD-10-CM

## 2013-10-09 MED ORDER — LEVETIRACETAM 500 MG PO TABS: 500.0000 mg | ORAL_TABLET | Freq: Two times a day (BID) | ORAL | Status: DC

## 2013-10-09 NOTE — Progress Notes (Signed)
[facility]    Provider:  Dr Janann Colonel Referring Provider: Ricke Hey, MD Primary Care Physician:  Ricke Hey, MD  CC:  Seizures, question of MS  HPI:  [name] is a 53 y.o. female here as a referral from Dr. Alyson Ingles for seizure evalution  53y/o woman with history of seizures, and questionable MS, presenting for initial evaluation of seizure disorder. Was seen in ED 08/2013 and started on keppra 500mg  BID. States she has had seizures since childhood but had been seizure free for "years" up until December. In December 2014 has had 2 suspected seizures and then an additional event in Feb. The December events coincided with discontinuing Cymbalta, which she had been on for years. Describes most recent event as feeling funny/anxious, felt dizzy, that is the last thing she recalls until EMS arrived. Her sister noted she was shaking, eyes rolled, not responding, very confused and agitated after the event. Shaking lasted around 3 minutes, + tongue biting, no loss of bowel/bladder. Currently taking keppra 500mg  BID, tolerating well.   States she was diagnosed with MS in 2000, took beta-seron up until 2005. Main symptoms were visual changes, gait abnormality and difficulty with fine motor tasks. Last MRI was in 2012 and was unremarkable. States MS diagnosed via MRI and LP. States she is currently asymptomatic.   ED evaluation 08/2013: history of seizure disorder as a child, took phenobarbital until age 70, also has a history of multiple sclerosis that has been confirmed in the past and with whom she follows with neurology. She does not have any recent findings on MRI from 2012 to suggest acute disease. She states that while she was ambulating from her bathroom to her bedroom this evening she developed acute onset of mild vertigo feeling like the room is spinning, felt as if that she was going to pass out and remembers climbing onto the bed. She woke up a short time later  on the bed stating that she was "half on and half off" the bed. She also had a severe headache when she awoke, she had difficulty using the phone stating that she could not remember phone numbers and had difficulty with manual dexterity that would normally come easy to her. She also complained of her jaw hurting at that time feeling as though she had been clenching her jaw. She denies any tongue biting or urinary incontinence. She does have a history of occasional headaches, she has photophobia with this headache and mild nausea. She denies focal weakness or numbness at this time to me. She was recently admitted to a hospital in [place] within the last 2 months during which time she had multiple seizures before and during her hospitalization which were thought to be due to Cymbalta at that time. This medication was discontinued, she has not been taking it. She does not currently take any antiseizure medications, she does not drink alcohol, she does not use benzodiazepines regularly but takes occasional Xanax or Valium. It is known that she has a significant history of bipolar disorder and does take Lamictal for this. She does not feel as though she is acutely manic at this time   MRI brain 2012 reviewed and was overall unremarkable.   Review of Systems: Out of a complete 14 system review, the patient complains of only the following symptoms, and all other reviewed systems are negative. + blurred vision , eye pain, fatigue, joint pain, memory loss, confusion, headaches, numbness  History   Social History  . Marital  Status: Divorced    Spouse Name: N/A    Number of Children: 3  . Years of Education: N/A   Occupational History  . Disabled    Social History Main Topics  . Smoking status: Former Smoker -- 1.00 packs/day for 5 years    Quit date: 07/07/2007  . Smokeless tobacco: Never Used  . Alcohol Use: No  . Drug Use: No  . Sexual Activity: Not Currently    Birth Control/ Protection:  Surgical     Comment: X 1 year   Other Topics Concern  . Not on file   Social History Narrative   Single, 3 children   Right handed   Associates degree   occ 1 cup daily    Family History  Problem Relation Age of Onset  . Ovarian cancer Mother   . Heart disease Father   . Colon cancer Neg Hx   . Esophageal cancer Neg Hx   . Stomach cancer Neg Hx   . Rectal cancer Neg Hx     Past Medical History  Diagnosis Date  . Anemia   . Anxiety state, unspecified   . Backache, unspecified   . Depressive disorder, not elsewhere classified   . Esophageal reflux   . Other abnormal glucose     05-19-13 no problems now  . Fibromyalgia   . Arthropathy, unspecified, site unspecified   . MS (multiple sclerosis)     remission at present(past hx. bedridden) -ambulates with cane-left side weaker  . Arthritis   . Hypertension   . Obstructive sleep apnea (adult) (pediatric)     no cpap-uses Oxygen nightly 2l/m   . On home oxygen therapy 05-19-13    nightly at 2l/m  . Asthma   . Seizures     Past Surgical History  Procedure Laterality Date  . Appendectomy    . Cholecystectomy    . Tubal ligation    . Vaginal hysterectomy    . Hernia repair      umbilical  . Cesarean section  1988    twins  . Colonoscopy N/A 06/12/2013    Procedure: COLONOSCOPY;  Surgeon: Irene Shipper, MD;  Location: [facility];  Service: Endoscopy;  Laterality: N/A;    Current Outpatient Prescriptions  Medication Sig Dispense Refill  . albuterol (PROVENTIL HFA;VENTOLIN HFA) 108 (90 BASE) MCG/ACT inhaler Inhale 2 puffs into the lungs every 4 (four) hours as needed for wheezing or shortness of breath.      . ALPRAZolam (XANAX) 0.5 MG tablet Take 1 tablet by mouth 3 (three) times daily as needed for anxiety or sleep.       . cyclobenzaprine (FLEXERIL) 10 MG tablet Take 10 mg by mouth 3 (three) times daily as needed for muscle spasms.      Marland Kitchen EPINEPHrine (EPI-PEN) 0.3 mg/0.3 mL DEVI Inject 0.3 mg into the muscle  once. As needed for severe allergic reaction      . famotidine (PEPCID) 20 MG tablet Take 1 tablet (20 mg total) by mouth 2 (two) times daily. For stomach acid.  60 tablet  0  . Fluticasone-Salmeterol (ADVAIR DISKUS) 500-50 MCG/DOSE AEPB Inhale 1 puff into the lungs every 12 (twelve) hours. For asthma.  60 each  0  . HYDROcodone-acetaminophen (NORCO) 10-325 MG per tablet Take 1 tablet by mouth every 6 (six) hours as needed for moderate pain or severe pain.       Marland Kitchen lamoTRIgine (LAMICTAL) 25 MG tablet Take 75 mg by mouth 2 (two) times daily.      Marland Kitchen  levETIRAcetam (KEPPRA) 500 MG tablet Take 1 tablet (500 mg total) by mouth 2 (two) times daily.  60 tablet  1  . loratadine (CLARITIN) 10 MG tablet Take 10 mg by mouth daily as needed for allergies.      . metoprolol succinate (TOPROL-XL) 50 MG 24 hr tablet Take 50 mg by mouth at bedtime. Take with or immediately following a meal for blood pressure control.      . montelukast (SINGULAIR) 10 MG tablet Take 10 mg by mouth at bedtime.      . Multiple Vitamin (MULTIVITAMIN WITH MINERALS) TABS Take 1 tablet by mouth daily. For nutritional supplementation.  30 tablet  0  . trifluoperazine (STELAZINE) 2 MG tablet Take 2 mg by mouth at bedtime.       No current facility-administered medications for this visit.    Allergies as of 10/09/2013 - Review Complete 10/09/2013  Allergen Reaction Noted  . Depakote [divalproex sodium] Shortness Of Breath and Rash 04/03/2012  . Latex Anaphylaxis   . Peanut-containing drug products Anaphylaxis 04/03/2012  . Penicillins Anaphylaxis   . Seroquel [quetiapine] Anaphylaxis 05/04/2012  . Shellfish-derived products Anaphylaxis 11/12/2010  . Amitriptyline hcl Other (See Comments)   . Bee venom  05/18/2013  . Butorphanol tartrate Other (See Comments)   . Gabapentin Other (See Comments) 12/25/2011    Vitals: BP 129/88  Pulse 99  Ht 5' 9.25" (1.759 m)  Wt 256 lb (116.121 kg)  BMI 37.53 kg/m2 Last Weight:  Wt Readings  from Last 1 Encounters:  10/09/13 256 lb (116.121 kg)   Last Height:   Ht Readings from Last 1 Encounters:  10/09/13 5' 9.25" (1.759 m)     Physical exam: Exam: Gen: NAD, conversant Eyes: anicteric sclerae, moist conjunctivae HENT: Atraumatic, oropharynx clear Neck: Trachea midline; supple,  Lungs: CTA, no wheezing, rales, rhonic                          CV: RRR, no MRG Abdomen: Soft, non-tender;  Extremities: No peripheral edema  Skin: Normal temperature, no rash,  Psych: Appropriate affect, pleasant  Neuro: MS: AA&Ox3, appropriately interactive, normal affect   Speech: fluent w/o paraphasic error  Memory: good recent and remote recall  CN: PERRL, EOMI no nystagmus, no ptosis, sensation intact to LT V1-V3 bilat, face symmetric, no weakness, hearing grossly intact, palate elevates symmetrically, shoulder shrug 5/5 bilat,  tongue protrudes midline, no fasiculations noted.  Motor: normal bulk and tone Strength: 5/5  In all extremities  Coord: rapid alternating and point-to-point (FNF, HTS) movements intact.  Reflexes: symmetrical, bilat downgoing toes  Sens: LT intact in all extremities  Gait: posture, stance, stride and arm-swing normal. Tandem gait intact. Able to walk on heels and toes. Romberg absent.   Assessment:  After physical and neurologic examination, review of laboratory studies, imaging, neurophysiology testing and pre-existing records, assessment will be reviewed on the problem list.  Plan:  Treatment plan and additional workup will be reviewed under Problem List.  1)Seizures 2)MS  53y/o woman with reported history of seizures and MS presenting for initial evaluation after recent seizure events in December and February. Currently taking Keppra 500mg  BID, tolerating well with no breakthrough seizures. Will continue, refills sent. Will check EEG. Last MRI in 2012 with no signs of MS flare up, will repeat brain MRI at this time. Patient not currently  driving. Follow up in 6 months.   Jim Like, DO  [facility] [place]  [place], [place] [address]  Phone [phone] Fax [phone]

## 2013-10-09 NOTE — Patient Instructions (Addendum)
Overall you are doing fairly well but I do want to suggest a few things today:   Remember to drink plenty of fluid, eat healthy meals and do not skip any meals. Try to eat protein with a every meal and eat a healthy snack such as fruit or nuts in between meals. Try to keep a regular sleep-wake schedule and try to exercise daily, particularly in the form of walking, 20-30 minutes a day, if you can.   As far as your medications are concerned, I would like to suggest continue on Keppra 500mg  twice a day  As far as diagnostic testing:  1)Please schedule an EEG when you check out 2)I would like you to have a brain MRI to check for progression of your MS  I would like to see you back in 6 months, sooner if we need to. Please call us with any interim questions, concerns, problems, updates or refill requests.   Please also call us for any test results so we can go over those with you on the phone.  My clinical assistant and will answer any of your questions and relay your messages to me and also relay most of my messages to you.   Our phone number is [phone]. We also have an after hours call service for urgent matters and there is a physician on-call for urgent questions. For any emergencies you know to call 911 or go to the nearest emergency room

## 2013-10-10 DIAGNOSIS — Z0289 Encounter for other administrative examinations: Secondary | ICD-10-CM

## 2013-10-13 ENCOUNTER — Other Ambulatory Visit: Payer: Medicare Other | Admitting: Radiology

## 2013-10-16 ENCOUNTER — Ambulatory Visit: Payer: Self-pay | Admitting: Internal Medicine

## 2013-10-16 ENCOUNTER — Ambulatory Visit (HOSPITAL_COMMUNITY)
Admission: RE | Admit: 2013-10-16 | Discharge: 2013-10-16 | Disposition: A | Discharge status: Home / Self Care | Payer: Medicare Other | Source: Ambulatory Visit | Attending: Obstetrics and Gynecology | Admitting: Obstetrics and Gynecology

## 2013-10-16 DIAGNOSIS — Z1231 Encounter for screening mammogram for malignant neoplasm of breast: Secondary | ICD-10-CM | POA: Insufficient documentation

## 2013-10-18 ENCOUNTER — Ambulatory Visit
Admission: RE | Admit: 2013-10-18 | Discharge: 2013-10-18 | Disposition: A | Discharge status: Home / Self Care | Payer: Medicare Other | Source: Ambulatory Visit | Attending: Neurology | Admitting: Neurology

## 2013-10-18 DIAGNOSIS — G35 Multiple sclerosis: Secondary | ICD-10-CM

## 2013-10-18 DIAGNOSIS — R569 Unspecified convulsions: Secondary | ICD-10-CM

## 2013-10-18 MED ORDER — GADOBENATE DIMEGLUMINE 529 MG/ML IV SOLN
20.0000 mL | Freq: Once | INTRAVENOUS | Status: AC | PRN
  Administered 2013-10-18: 20 mL via INTRAVENOUS

## 2013-10-27 ENCOUNTER — Telehealth: Payer: Self-pay | Admitting: *Deleted

## 2013-10-27 NOTE — Progress Notes (Signed)
Quick Note:  Shared MR Brain results with patient per Dr Hazle Quant findings, she verbalized understanding ______

## 2013-10-27 NOTE — Telephone Encounter (Signed)
Pt called back and was not able to hear  Well the person who called for her MRI results.  She was in a doctors appt at the time.   She asked about the results of MRI and I relayed to her the results showed no enhancing MS lesions but few new spots of gliosis.  Confirmed appt in October 2015.  She had EEG today.  Will call her with results.

## 2013-10-30 ENCOUNTER — Other Ambulatory Visit: Payer: Self-pay | Admitting: Internal Medicine

## 2013-11-03 DIAGNOSIS — I10 Essential (primary) hypertension: Secondary | ICD-10-CM | POA: Insufficient documentation

## 2013-11-03 DIAGNOSIS — G43119 Migraine with aura, intractable, without status migrainosus: Secondary | ICD-10-CM

## 2013-11-03 DIAGNOSIS — K449 Diaphragmatic hernia without obstruction or gangrene: Secondary | ICD-10-CM

## 2013-11-03 DIAGNOSIS — M533 Sacrococcygeal disorders, not elsewhere classified: Secondary | ICD-10-CM

## 2013-11-03 DIAGNOSIS — M797 Fibromyalgia: Secondary | ICD-10-CM | POA: Insufficient documentation

## 2013-11-03 DIAGNOSIS — G35 Multiple sclerosis: Secondary | ICD-10-CM

## 2013-11-03 DIAGNOSIS — M47817 Spondylosis without myelopathy or radiculopathy, lumbosacral region: Secondary | ICD-10-CM

## 2013-11-03 HISTORY — DX: Migraine with aura, intractable, without status migrainosus: G43.119

## 2013-11-03 HISTORY — DX: Spondylosis without myelopathy or radiculopathy, lumbosacral region: M47.817

## 2013-11-03 HISTORY — DX: Diaphragmatic hernia without obstruction or gangrene: K44.9

## 2013-11-03 HISTORY — DX: Multiple sclerosis: G35

## 2013-11-03 HISTORY — DX: Sacrococcygeal disorders, not elsewhere classified: M53.3

## 2013-11-14 ENCOUNTER — Encounter (INDEPENDENT_AMBULATORY_CARE_PROVIDER_SITE_OTHER): Payer: Self-pay

## 2013-11-14 ENCOUNTER — Ambulatory Visit (INDEPENDENT_AMBULATORY_CARE_PROVIDER_SITE_OTHER): Payer: Medicare Other | Admitting: Internal Medicine

## 2013-11-14 ENCOUNTER — Other Ambulatory Visit (INDEPENDENT_AMBULATORY_CARE_PROVIDER_SITE_OTHER): Payer: Medicare Other

## 2013-11-14 ENCOUNTER — Encounter: Payer: Self-pay | Admitting: Internal Medicine

## 2013-11-14 VITALS — BP 146/84 | HR 83 | Ht 68.0 in | Wt 250.6 lb

## 2013-11-14 DIAGNOSIS — G40909 Epilepsy, unspecified, not intractable, without status epilepticus: Secondary | ICD-10-CM

## 2013-11-14 DIAGNOSIS — G4733 Obstructive sleep apnea (adult) (pediatric): Secondary | ICD-10-CM

## 2013-11-14 DIAGNOSIS — G43909 Migraine, unspecified, not intractable, without status migrainosus: Secondary | ICD-10-CM

## 2013-11-14 DIAGNOSIS — J45909 Unspecified asthma, uncomplicated: Secondary | ICD-10-CM

## 2013-11-14 DIAGNOSIS — J309 Allergic rhinitis, unspecified: Secondary | ICD-10-CM

## 2013-11-14 DIAGNOSIS — E039 Hypothyroidism, unspecified: Secondary | ICD-10-CM

## 2013-11-14 DIAGNOSIS — E162 Hypoglycemia, unspecified: Secondary | ICD-10-CM

## 2013-11-14 LAB — HEMOGLOBIN A1C: Hgb A1c MFr Bld: 6.6 % — ABNORMAL HIGH (ref 4.6–6.5)

## 2013-11-14 LAB — TSH: TSH: 0.46 u[IU]/mL (ref 0.35–4.50)

## 2013-11-14 MED ORDER — MONTELUKAST SODIUM 10 MG PO TABS: 10.0000 mg | ORAL_TABLET | Freq: Every day | ORAL | Status: DC

## 2013-11-14 NOTE — Patient Instructions (Signed)
We can continue present meds   Order lab- A1C, TSH    Dx seizure disorder  Script sent to refill Singulair  Please call as needed

## 2013-11-14 NOTE — Progress Notes (Signed)
Subjective:    Patient ID: [name], female    DOB: 1960/12/16, 53 y.o.   MRN: [id number] HPI  53 yo AAF with known hx of Asthma and Allergic Rhinitis Former smoker.   /10/2012 Acute OV  Complains of being off the advair x1 year d/t insurance issues.  SABA use bid x2 week b/c of DOE, tightness in chest, prod cough with clear mucus and some wheezing with the weather change.   Now she has insurance coverage to cover Advair.  Symptoms seem worse with pollen outside.  No hemoptysis , chest pain or edema.  No recent ER or hospital visits  Last CXR 12/2011 w/ no acute process   01/17/13- 39 yoF former smoker followed for asthma and allergic rhinitis Usually followed here by Dr Joya Gaskins. FOLLOWS FOR: would like to discuss restating allergy vaccine and needs a refill on her epipen.  c/o increased SOB, wheezing, watery eyes, chest tightness with the heat/humidity.  denies cough,  Allergy skin test 10/10/08. On allergy vaccine briefly, stopped with question of skin rash and chest tightness which may not have been related. This year when outside she gets watery eyes, sneezing, pruritic hives. She has tried Zyrtec and Claritin. Chest feels clear. Seasonal spring and summer, than fall symptoms. Lives in an old house where she thinks there is mold. We discussed mold remediation and moisture control.  03/22/13- 79 yoF former smoker followed for asthma and allergic rhinitis FOLLOWS FOR: review labs in detail with patient; had flare up when she came back to [place] from [place].  Resumed sneezing and wheezing when she came back into [place] September 1-  not sure if it was location or time of year. Using Advair and occasional rescue inhaler. Feels well today. Living in house with no pets, no smokers, no mold. Allergy Profile 01/18/2013-total IgE 242.4 with broad elevations for common inhalant allergens. She would be a candidate for allergy vaccine- discussed her previous experience in 2010.  she is satisfied  to wait and watch.  11/14/13-  51 yoF former smoker followed for asthma and allergic rhinitis, complicated by GERD, depression, VCD, seizure disorcder FOLLOWS FOR: runny eyes- worse in April, improving now; otherwise no flare ups at this time. Does wheeze but feels controlled. Infrequent need for rescue inhaler. Had 2 seizures on Keppra and was told to ask [place] to check A1c and TSH while she was here.  ROS-see HPI Constitutional:   No-   weight loss, night sweats, fevers, chills, fatigue, lassitude. HEENT:   No-  headaches, difficulty swallowing, tooth/dental problems, sore throat,       +sneezing, itching, ear ache, nasal congestion, post nasal drip,  CV:  No-   chest pain, orthopnea, PND, swelling in lower extremities, anasarca, dizziness, palpitations Resp: No-   shortness of breath with exertion or at rest.              No-   productive cough,  No non-productive cough,  No- coughing up of blood.              No-   change in color of mucus.  No- wheezing now.   Skin: + rash per HPI GI:  No-   heartburn, indigestion, abdominal pain, nausea, vomiting,  GU:  MS:  No-   joint pain or swelling.   Neuro-     nothing unusual Psych:  No- change in mood or affect. No depression or anxiety.  No memory loss.  Objective:  OBJ- Physical Exam General-  Alert, Oriented, Affect-appropriate, Distress- none acute. Overweight Skin- rash-none, lesions- none, excoriation- none Lymphadenopathy- none Head- atraumatic            Eyes- Gross vision intact, PERRLA, conjunctivae and secretions clear            Ears- Hearing, canals-normal            Nose- Clear, no-Septal dev, mucus, polyps, erosion, perforation             Throat- Mallampati II , mucosa clear , drainage- none, tonsils- atrophic Neck- flexible , trachea midline, no stridor , thyroid nl, carotid no bruit Chest - symmetrical excursion , unlabored           Heart/CV- RRR , no murmur , no gallop  , no rub, nl s1 s2                           -  JVD- none , edema- none, stasis changes- none, varices- none           Lung- clear to P&A, wheeze- none, cough- none , dullness-none, rub- none           Chest wall-  Abd-  Br/ Gen/ Rectal- Not done, not indicated Extrem- cyanosis- none, clubbing, none, atrophy- none, strength- nl.  Neuro- grossly intact to observation  Assessment & Plan:

## 2013-12-30 DIAGNOSIS — G40909 Epilepsy, unspecified, not intractable, without status epilepticus: Secondary | ICD-10-CM | POA: Insufficient documentation

## 2013-12-30 NOTE — Assessment & Plan Note (Signed)
Plan-refill Singulair

## 2013-12-30 NOTE — Assessment & Plan Note (Signed)
Plan-refill Singulair, keep rescue inhaler available

## 2013-12-30 NOTE — Assessment & Plan Note (Signed)
O2 2L sleep/ Advanced

## 2013-12-30 NOTE — Assessment & Plan Note (Signed)
Managed elsewhere-Kepra

## 2014-04-10 ENCOUNTER — Encounter: Payer: Self-pay | Admitting: Adult Health

## 2014-04-10 ENCOUNTER — Ambulatory Visit (INDEPENDENT_AMBULATORY_CARE_PROVIDER_SITE_OTHER): Payer: Medicare Other | Admitting: Adult Health

## 2014-04-10 VITALS — BP 133/89 | HR 98 | Ht 69.25 in | Wt 260.4 lb

## 2014-04-10 DIAGNOSIS — G8929 Other chronic pain: Secondary | ICD-10-CM

## 2014-04-10 DIAGNOSIS — R519 Headache, unspecified: Secondary | ICD-10-CM

## 2014-04-10 DIAGNOSIS — G40909 Epilepsy, unspecified, not intractable, without status epilepticus: Secondary | ICD-10-CM

## 2014-04-10 DIAGNOSIS — R51 Headache: Secondary | ICD-10-CM

## 2014-04-10 MED ORDER — TOPIRAMATE 50 MG PO TABS: ORAL_TABLET | ORAL | Status: DC

## 2014-04-10 NOTE — Progress Notes (Signed)
PATIENT: [name] DOB: January 11, 1961  REASON FOR VISIT: follow up HISTORY FROM: patient  HISTORY OF PRESENT ILLNESS: Ms. Gebel is a 53 year old female with a history of seizures and multiple sclerosis. She returns today for follow-up. She is currently taking Keppra 500 mg BID and is tolerating it well. She reports that her last seizure was in February. She states that since she started Keppra her seizures have been controlled. However she did notice that she began to have daily headaches with the Keppra. She rates her headache as a 9/10 most days. She confirms that since February she has had a daily headache with 9/10 pain. She + for phonophobia and photophobia. She has Norco for back pain and she states that will help her headaches some. She states that she has a history of migraines in the past and was on medication. She did take Topamax for her migraines and it was beneficial. She does not operate a motor vehicle. She also was diagnosed with MS. Her latest MRI showed Few (~5-6) punctate, subcortical and juxtacortical foci on non-specific T2 hyperintensities. She denies any new numbness or weakness. No changes in her gait, balance, bowels, bladder or vision.   HISTORY 10/09/13 (PS): 52y/o woman with history of seizures, and questionable MS, presenting for initial evaluation of seizure disorder. Was seen in ED 08/2013 and started on keppra 500mg  BID. States she has had seizures since childhood but had been seizure free for "years" up until December. In December 2014 has had 2 suspected seizures and then an additional event in Feb. The December events coincided with discontinuing Cymbalta, which she had been on for years. Describes most recent event as feeling funny/anxious, felt dizzy, that is the last thing she recalls until EMS arrived. Her sister noted she was shaking, eyes rolled, not responding, very confused and agitated after the event. Shaking lasted around 3 minutes, + tongue biting, no  loss of bowel/bladder. Currently taking keppra 500mg  BID, tolerating well.  States she was diagnosed with MS in 2000, took beta-seron up until 2005. Main symptoms were visual changes, gait abnormality and difficulty with fine motor tasks. Last MRI was in 2012 and was unremarkable. States MS diagnosed via MRI and LP. States she is currently asymptomatic.   REVIEW OF SYSTEMS: Full 14 system review of systems performed and notable only for:  Constitutional:   appetite change, fatigue Eyes:  Eye itching  Ear/Nose/Throat:  Ear pain, radiating years Skin: N/A  Cardiovascular:  Murmur Respiratory:  Shortness of breath, choking, chest tightness Gastrointestinal: Abdominal pain, nausea, vomiting Genitourinary: abdominal pain, nausea, vomiting Hematology/Lymphatic: N/A  Endocrine: N/A Musculoskeletal: joint pain, back pain, walking difficulty Allergy/Immunology:  Food allergy Neurological:  Memory loss, dizziness, headache, seizure, weakness, tremors, passing out  Psychiatric:  Agitation, depression, nervous/anxious  Sleep:  Insomnia, apnea    ALLERGIES: Allergies  Allergen Reactions  . Depakote [Divalproex Sodium] Shortness Of Breath and Rash  . Latex Anaphylaxis  . Peanut-Containing Drug Products Anaphylaxis  . Penicillins Anaphylaxis  . Seroquel [Quetiapine] Anaphylaxis  . Shellfish-Derived Products Anaphylaxis  . Amitriptyline Hcl Other (See Comments)    REACTION: HALLUCINATIONS  . Bee Venom   . Butorphanol Tartrate Other (See Comments)    REACTION: Hallucinations  . Gabapentin Other (See Comments)    Pt reports severe back and side pain    HOME MEDICATIONS: Outpatient Prescriptions Prior to Visit  Medication Sig Dispense Refill  . ADVAIR DISKUS 500-50 MCG/DOSE AEPB USE ONE INHALATION BY MOUTH EVERY 12  HOURS FOR ASTHMA  60 each  5  . ALPRAZolam (XANAX) 0.5 MG tablet Take 1 tablet by mouth 3 (three) times daily as needed for anxiety or sleep.       . cyclobenzaprine (FLEXERIL) 10  MG tablet Take 10 mg by mouth 3 (three) times daily as needed for muscle spasms.      Marland Kitchen EPINEPHrine (EPI-PEN) 0.3 mg/0.3 mL DEVI Inject 0.3 mg into the muscle once. As needed for severe allergic reaction      . famotidine (PEPCID) 20 MG tablet Take 1 tablet (20 mg total) by mouth 2 (two) times daily. For stomach acid.  60 tablet  0  . HYDROcodone-acetaminophen (NORCO) 10-325 MG per tablet Take 1 tablet by mouth every 6 (six) hours as needed for moderate pain or severe pain.       Marland Kitchen lamoTRIgine (LAMICTAL) 25 MG tablet Take 75 mg by mouth 2 (two) times daily.      Marland Kitchen levETIRAcetam (KEPPRA) 500 MG tablet Take 1 tablet (500 mg total) by mouth 2 (two) times daily.  60 tablet  6  . loratadine (CLARITIN) 10 MG tablet Take 10 mg by mouth daily as needed for allergies.      . metoprolol succinate (TOPROL-XL) 50 MG 24 hr tablet Take 50 mg by mouth at bedtime. Take with or immediately following a meal for blood pressure control.      . montelukast (SINGULAIR) 10 MG tablet Take 1 tablet (10 mg total) by mouth at bedtime.  30 tablet  prn  . Multiple Vitamin (MULTIVITAMIN WITH MINERALS) TABS Take 1 tablet by mouth daily. For nutritional supplementation.  30 tablet  0  . PROAIR HFA 108 (90 BASE) MCG/ACT inhaler INHALE 2 PUFFS BY MOUTH EVERY 4 HOURS AS NEEDED FOR WHEEZING OR SHORTNESS OF BREATH  8.5 g  5  . trifluoperazine (STELAZINE) 2 MG tablet Take 2 mg by mouth at bedtime.       No facility-administered medications prior to visit.    PAST MEDICAL HISTORY: Past Medical History  Diagnosis Date  . Anemia   . Anxiety state, unspecified   . Backache, unspecified   . Depressive disorder, not elsewhere classified   . Esophageal reflux   . Other abnormal glucose     05-19-13 no problems now  . Fibromyalgia   . Arthropathy, unspecified, site unspecified   . MS (multiple sclerosis)     remission at present(past hx. bedridden) -ambulates with cane-left side weaker  . Arthritis   . Hypertension   .  Obstructive sleep apnea (adult) (pediatric)     no cpap-uses Oxygen nightly 2l/m   . On home oxygen therapy 05-19-13    nightly at 2l/m  . Asthma   . Seizures     PAST SURGICAL HISTORY: Past Surgical History  Procedure Laterality Date  . Appendectomy    . Cholecystectomy    . Tubal ligation    . Vaginal hysterectomy    . Hernia repair      umbilical  . Cesarean section  1988    twins  . Colonoscopy N/A 06/12/2013    Procedure: COLONOSCOPY;  Surgeon: Irene Shipper, MD;  Location: [facility];  Service: Endoscopy;  Laterality: N/A;    FAMILY HISTORY: Family History  Problem Relation Age of Onset  . Ovarian cancer Mother   . Heart disease Father   . Colon cancer Neg Hx   . Esophageal cancer Neg Hx   . Stomach cancer Neg Hx   . Rectal  cancer Neg Hx     SOCIAL HISTORY: History   Social History  . Marital Status: Divorced    Spouse Name: N/A    Number of Children: 3  . Years of Education: 12+   Occupational History  . Disabled    Social History Main Topics  . Smoking status: Former Smoker -- 1.00 packs/day for 5 years    Quit date: 07/07/2007  . Smokeless tobacco: Never Used  . Alcohol Use: No  . Drug Use: No  . Sexual Activity: Not Currently    Birth Control/ Protection: Surgical     Comment: X 1 year   Other Topics Concern  . Not on file   Social History Narrative   Single, 3 children   Right handed   Associates degree   occ 1 cup daily      PHYSICAL EXAM  Filed Vitals:   04/10/14 1014  BP: 133/89  Pulse: 98  Height: 5' 9.25" (1.759 m)  Weight: 260 lb 6.4 oz (118.117 kg)   Body mass index is 38.18 kg/(m^2).  Generalized: Well developed, in no acute distress   Neurological examination  Mentation: Alert oriented to time, place, history taking. Follows all commands speech and language fluent Cranial nerve II-XII: Pupils were equal round reactive to light. Extraocular movements were full, visual field were full on confrontational test. Facial  sensation and strength were normal.  Uvula tongue midline. Head turning and shoulder shrug  were normal and symmetric. Motor: The motor testing reveals 5 over 5 strength of all 4 extremities. Good symmetric motor tone is noted throughout.  Sensory: Sensory testing is intact to soft touch on all 4 extremities. No evidence of extinction is noted.  Coordination: Cerebellar testing reveals good finger-nose-finger and heel-to-shin bilaterally.  Gait and station: Gait is normal. Tandem gait is slightly unsteady. Romberg is negative. No drift is seen.  Reflexes: Deep tendon reflexes are symmetric and normal bilaterally.    DIAGNOSTIC DATA (LABS, IMAGING, TESTING) - I reviewed patient records, labs, notes, testing and imaging myself where available.  Lab Results  Component Value Date   WBC 6.0 04/03/2012   HGB 13.8 04/03/2012   HCT 41.2 04/03/2012   MCV 83.6 04/03/2012   PLT 319 04/03/2012      Component Value Date/Time   NA 140 08/26/2013 0117   K 3.6* 08/26/2013 0117   CL 101 08/26/2013 0117   CO2 26 08/26/2013 0117   GLUCOSE 100* 08/26/2013 0117   BUN 11 08/26/2013 0117   CREATININE 0.84 08/26/2013 0117   CALCIUM 9.5 08/26/2013 0117   PROT 6.8 04/04/2012 2100   ALBUMIN 3.3* 04/04/2012 2100   AST 21 04/04/2012 2100   ALT 53* 04/04/2012 2100   ALKPHOS 98 04/04/2012 2100   BILITOT 0.1* 04/04/2012 2100   GFRNONAA 79* 08/26/2013 0117   GFRAA >90 08/26/2013 0117    Lab Results  Component Value Date   HGBA1C 6.6* 11/14/2013    Lab Results  Component Value Date   TSH 0.46 11/14/2013      ASSESSMENT AND PLAN 53 y.o. year old female  has a past medical history of Anemia; Anxiety state, unspecified; Backache, unspecified; Depressive disorder, not elsewhere classified; Esophageal reflux; Other abnormal glucose; Fibromyalgia; Arthropathy, unspecified, site unspecified; MS (multiple sclerosis); Arthritis; Hypertension; Obstructive sleep apnea (adult) (pediatric); On home oxygen therapy (05-19-13); Asthma;  and Seizures. here with:  1. Seizures 2. headache 3. possible multiple sclerosis  Patient seizures have been well controlled on Keppra. However she noticed that  she began to have a daily headache after she started Keppra. Her headache pain is usually a 9/10. She does have a past history of migraines. She has been on Topamax in the past and it controlled her migraines per patient. I will start the patient on Topamax 50 mg twice a day for 1 week then increase to 2 tablets twice a day thereafter. The patient is to call me in one month and let me know if her headaches have improved. In the future we may wean down the Keppra if Topamax as tolerated. Patient will followup in 2 months or sooner if needed.   Ward Givens, MSN, NP-C 04/10/2014, 10:18 AM [facility] [address], [place], [place] [address] [phone]  Note: This document was prepared with digital dictation and possible smart phrase technology. Any transcriptional errors that result from this process are unintentional.

## 2014-04-10 NOTE — Patient Instructions (Signed)
Start Topamax 50 mg 1 tablet twice a day for 1 week then increase to two tablets twice a day thereafter. Please call me in 1 month and let me know if your headaches have improved.   Topiramate tablets What is this medicine? TOPIRAMATE (toe PYRE a mate) is used to treat seizures in adults or children with epilepsy. It is also used for the prevention of migraine headaches. This medicine may be used for other purposes; ask your health care provider or pharmacist if you have questions. COMMON BRAND NAME(S): Topamax, Topiragen What should I tell my health care provider before I take this medicine? They need to know if you have any of these conditions: -bleeding disorders -cirrhosis of the liver or liver disease -diarrhea -glaucoma -kidney stones or kidney disease -low blood counts, like low white cell, platelet, or red cell counts -lung disease like asthma, obstructive pulmonary disease, emphysema -metabolic acidosis -on a ketogenic diet -schedule for surgery or a procedure -suicidal thoughts, plans, or attempt; a previous suicide attempt by you or a family member -an unusual or allergic reaction to topiramate, other medicines, foods, dyes, or preservatives -pregnant or trying to get pregnant -breast-feeding How should I use this medicine? Take this medicine by mouth with a glass of water. Follow the directions on the prescription label. Do not crush or chew. You may take this medicine with meals. Take your medicine at regular intervals. Do not take it more often than directed. Talk to your pediatrician regarding the use of this medicine in children. Special care may be needed. While this drug may be prescribed for children as young as 56 years of age for selected conditions, precautions do apply. Overdosage: If you think you have taken too much of this medicine contact a poison control center or emergency room at once. NOTE: This medicine is only for you. Do not share this medicine with  others. What if I miss a dose? If you miss a dose, take it as soon as you can. If your next dose is to be taken in less than 6 hours, then do not take the missed dose. Take the next dose at your regular time. Do not take double or extra doses. What may interact with this medicine? Do not take this medicine with any of the following medications: -probenecid This medicine may also interact with the following medications: -acetazolamide -alcohol -amitriptyline -aspirin and aspirin-like medicines -birth control pills -certain medicines for depression -certain medicines for seizures -certain medicines that treat or prevent blood clots like warfarin, enoxaparin, dalteparin, apixaban, dabigatran, and rivaroxaban -digoxin -hydrochlorothiazide -lithium -medicines for pain, sleep, or muscle relaxation -metformin -methazolamide -NSAIDS, medicines for pain and inflammation, like ibuprofen or naproxen -pioglitazone -risperidone This list may not describe all possible interactions. Give your health care provider a list of all the medicines, herbs, non-prescription drugs, or dietary supplements you use. Also tell them if you smoke, drink alcohol, or use illegal drugs. Some items may interact with your medicine. What should I watch for while using this medicine? Visit your doctor or health care professional for regular checks on your progress. Do not stop taking this medicine suddenly. This increases the risk of seizures if you are using this medicine to control epilepsy. Wear a medical identification bracelet or chain to say you have epilepsy or seizures, and carry a card that lists all your medicines. This medicine can decrease sweating and increase your body temperature. Watch for signs of deceased sweating or fever, especially in children. Avoid extreme heat,  hot baths, and saunas. Be careful about exercising, especially in hot weather. Contact your health care provider right away if you notice a  fever or decrease in sweating. You should drink plenty of fluids while taking this medicine. If you have had kidney stones in the past, this will help to reduce your chances of forming kidney stones. If you have stomach pain, with nausea or vomiting and yellowing of your eyes or skin, call your doctor immediately. You may get drowsy, dizzy, or have blurred vision. Do not drive, use machinery, or do anything that needs mental alertness until you know how this medicine affects you. To reduce dizziness, do not sit or stand up quickly, especially if you are an older patient. Alcohol can increase drowsiness and dizziness. Avoid alcoholic drinks. If you notice blurred vision, eye pain, or other eye problems, seek medical attention at once for an eye exam. The use of this medicine may increase the chance of suicidal thoughts or actions. Pay special attention to how you are responding while on this medicine. Any worsening of mood, or thoughts of suicide or dying should be reported to your health care professional right away. This medicine may increase the chance of developing metabolic acidosis. If left untreated, this can cause kidney stones, bone disease, or slowed growth in children. Symptoms include breathing fast, fatigue, loss of appetite, irregular heartbeat, or loss of consciousness. Call your doctor immediately if you experience any of these side effects. Also, tell your doctor about any surgery you plan on having while taking this medicine since this may increase your risk for metabolic acidosis. Birth control pills may not work properly while you are taking this medicine. Talk to your doctor about using an extra method of birth control. Women who become pregnant while using this medicine may enroll in the [place] Pregnancy Registry by calling [phone]. This registry collects information about the safety of antiepileptic drug use during pregnancy. What side effects may I  notice from receiving this medicine? Side effects that you should report to your doctor or health care professional as soon as possible: -allergic reactions like skin rash, itching or hives, swelling of the face, lips, or tongue -decreased sweating and/or rise in body temperature -depression -difficulty breathing, fast or irregular breathing patterns -difficulty speaking -difficulty walking or controlling muscle movements -hearing impairment -redness, blistering, peeling or loosening of the skin, including inside the mouth -tingling, pain or numbness in the hands or feet -unusual bleeding or bruising -unusually weak or tired -worsening of mood, thoughts or actions of suicide or dying Side effects that usually do not require medical attention (report to your doctor or health care professional if they continue or are bothersome): -altered taste -back pain, joint or muscle aches and pains -diarrhea, or constipation -headache -loss of appetite -nausea -stomach upset, indigestion -tremors This list may not describe all possible side effects. Call your doctor for medical advice about side effects. You may report side effects to FDA at 1-800-FDA-1088. Where should I keep my medicine? Keep out of the reach of children. Store at room temperature between 15 and 30 degrees C (59 and 86 degrees F) in a tightly closed container. Protect from moisture. Throw away any unused medicine after the expiration date. NOTE: This sheet is a summary. It may not cover all possible information. If you have questions about this medicine, talk to your doctor, pharmacist, or health care provider.  2015, [employer] Standard. (2013-06-26 23:17:57)

## 2014-04-10 NOTE — Progress Notes (Signed)
I have read the note, and I agree with the clinical assessment and plan.  WILLIS,CHARLES KEITH   

## 2014-05-07 ENCOUNTER — Encounter: Payer: Self-pay | Admitting: Adult Health

## 2014-05-14 ENCOUNTER — Telehealth: Payer: Self-pay | Admitting: Adult Health

## 2014-05-14 NOTE — Telephone Encounter (Signed)
I called the patient. She states that the Topamax has improved her headache frequency. She also states that she finished her bottle of Keppra and did not restart that. I advised the patient that if she has a seizure she should let us know immediately. The patient has a followup appointment with me next month.

## 2014-05-17 ENCOUNTER — Ambulatory Visit: Payer: Medicare Other | Admitting: Internal Medicine

## 2014-05-18 ENCOUNTER — Telehealth: Payer: Self-pay | Admitting: Adult Health

## 2014-05-18 NOTE — Telephone Encounter (Signed)
Patient stated since discontinuing Rx  levETIRAcetam (KEPPRA) 500 MG tablet she's experiencing headaches again.  Questioning if she needs to continue the [place].  Please call and advise.

## 2014-05-18 NOTE — Telephone Encounter (Signed)
I called the patient left a message on her voicemail. She can return my call at her convenience.

## 2014-05-22 NOTE — Telephone Encounter (Signed)
I called the patient back and left a message. In the voicemail I requested that she give a time that is most convenient to reach her.

## 2014-05-22 NOTE — Telephone Encounter (Signed)
Patient returning Clabe Seal, NP call.

## 2014-06-06 ENCOUNTER — Ambulatory Visit: Payer: Medicare Other | Admitting: Adult Health

## 2014-08-10 DIAGNOSIS — M5416 Radiculopathy, lumbar region: Secondary | ICD-10-CM | POA: Diagnosis not present

## 2014-08-27 ENCOUNTER — Ambulatory Visit: Payer: Medicare Other | Admitting: Adult Health

## 2014-08-29 ENCOUNTER — Other Ambulatory Visit: Payer: Self-pay | Admitting: Adult Health

## 2014-09-02 MED ORDER — TOPIRAMATE 50 MG PO TABS: 100.0000 mg | ORAL_TABLET | Freq: Two times a day (BID) | ORAL | Status: DC

## 2014-09-02 NOTE — Addendum Note (Signed)
Addended by: Trudie Buckler on: 09/02/2014 08:32 PM   Modules accepted: Orders

## 2014-09-07 DIAGNOSIS — M5416 Radiculopathy, lumbar region: Secondary | ICD-10-CM | POA: Diagnosis not present

## 2014-09-07 DIAGNOSIS — Z5181 Encounter for therapeutic drug level monitoring: Secondary | ICD-10-CM | POA: Diagnosis not present

## 2014-09-17 DIAGNOSIS — F250 Schizoaffective disorder, bipolar type: Secondary | ICD-10-CM | POA: Diagnosis not present

## 2014-10-01 ENCOUNTER — Telehealth: Payer: Self-pay | Admitting: *Deleted

## 2014-10-01 ENCOUNTER — Telehealth: Payer: Self-pay | Admitting: Adult Health

## 2014-10-01 ENCOUNTER — Ambulatory Visit: Payer: Medicare Other | Admitting: Adult Health

## 2014-10-01 NOTE — Telephone Encounter (Signed)
Patient no showed for a revisit appointment. This is the patient's 4th no show including an EEG visit. According to our protocol she should be dismissed from the practice.

## 2014-10-01 NOTE — Telephone Encounter (Signed)
No showed appt today. 

## 2014-10-02 ENCOUNTER — Encounter: Payer: Self-pay | Admitting: Adult Health

## 2014-10-03 ENCOUNTER — Encounter: Payer: Self-pay | Admitting: Neurology

## 2014-10-08 DIAGNOSIS — G35 Multiple sclerosis: Secondary | ICD-10-CM | POA: Diagnosis not present

## 2014-10-08 DIAGNOSIS — I10 Essential (primary) hypertension: Secondary | ICD-10-CM | POA: Diagnosis not present

## 2014-10-08 DIAGNOSIS — E669 Obesity, unspecified: Secondary | ICD-10-CM | POA: Diagnosis not present

## 2014-11-07 DIAGNOSIS — M5137 Other intervertebral disc degeneration, lumbosacral region: Secondary | ICD-10-CM | POA: Diagnosis not present

## 2014-11-07 DIAGNOSIS — M4727 Other spondylosis with radiculopathy, lumbosacral region: Secondary | ICD-10-CM | POA: Diagnosis not present

## 2014-11-07 DIAGNOSIS — M5416 Radiculopathy, lumbar region: Secondary | ICD-10-CM | POA: Diagnosis not present

## 2014-11-07 DIAGNOSIS — M797 Fibromyalgia: Secondary | ICD-10-CM | POA: Diagnosis not present

## 2014-12-25 DIAGNOSIS — M5137 Other intervertebral disc degeneration, lumbosacral region: Secondary | ICD-10-CM | POA: Diagnosis not present

## 2014-12-25 DIAGNOSIS — M797 Fibromyalgia: Secondary | ICD-10-CM | POA: Diagnosis not present

## 2014-12-25 DIAGNOSIS — M4727 Other spondylosis with radiculopathy, lumbosacral region: Secondary | ICD-10-CM | POA: Diagnosis not present

## 2014-12-25 DIAGNOSIS — M5416 Radiculopathy, lumbar region: Secondary | ICD-10-CM | POA: Diagnosis not present

## 2015-01-09 DIAGNOSIS — F250 Schizoaffective disorder, bipolar type: Secondary | ICD-10-CM | POA: Diagnosis not present

## 2015-03-12 DIAGNOSIS — M5137 Other intervertebral disc degeneration, lumbosacral region: Secondary | ICD-10-CM | POA: Diagnosis not present

## 2015-03-12 DIAGNOSIS — M533 Sacrococcygeal disorders, not elsewhere classified: Secondary | ICD-10-CM | POA: Diagnosis not present

## 2015-03-12 DIAGNOSIS — M5416 Radiculopathy, lumbar region: Secondary | ICD-10-CM | POA: Diagnosis not present

## 2015-03-12 DIAGNOSIS — M797 Fibromyalgia: Secondary | ICD-10-CM | POA: Diagnosis not present

## 2015-03-25 ENCOUNTER — Other Ambulatory Visit: Payer: Self-pay

## 2015-04-03 DIAGNOSIS — O926 Galactorrhea: Secondary | ICD-10-CM | POA: Diagnosis not present

## 2015-04-03 DIAGNOSIS — E669 Obesity, unspecified: Secondary | ICD-10-CM | POA: Diagnosis not present

## 2015-04-03 DIAGNOSIS — Z1231 Encounter for screening mammogram for malignant neoplasm of breast: Secondary | ICD-10-CM | POA: Diagnosis not present

## 2015-04-05 ENCOUNTER — Other Ambulatory Visit: Payer: Self-pay | Admitting: Obstetrics and Gynecology

## 2015-04-05 DIAGNOSIS — N6452 Nipple discharge: Secondary | ICD-10-CM

## 2015-04-11 ENCOUNTER — Other Ambulatory Visit: Payer: Medicare Other

## 2015-04-18 DIAGNOSIS — F250 Schizoaffective disorder, bipolar type: Secondary | ICD-10-CM | POA: Diagnosis not present

## 2015-04-19 ENCOUNTER — Ambulatory Visit
Admission: RE | Admit: 2015-04-19 | Discharge: 2015-04-19 | Disposition: A | Discharge status: Home / Self Care | Payer: Medicare Other | Source: Ambulatory Visit | Attending: Obstetrics and Gynecology | Admitting: Obstetrics and Gynecology

## 2015-04-19 ENCOUNTER — Other Ambulatory Visit: Payer: Self-pay | Admitting: Obstetrics and Gynecology

## 2015-04-19 DIAGNOSIS — R928 Other abnormal and inconclusive findings on diagnostic imaging of breast: Secondary | ICD-10-CM | POA: Diagnosis not present

## 2015-04-19 DIAGNOSIS — N63 Unspecified lump in breast: Secondary | ICD-10-CM | POA: Diagnosis not present

## 2015-04-19 DIAGNOSIS — N6452 Nipple discharge: Secondary | ICD-10-CM

## 2015-04-22 ENCOUNTER — Other Ambulatory Visit: Payer: Self-pay | Admitting: Obstetrics and Gynecology

## 2015-04-22 ENCOUNTER — Ambulatory Visit
Admission: RE | Admit: 2015-04-22 | Discharge: 2015-04-22 | Disposition: A | Discharge status: Home / Self Care | Payer: Medicare Other | Source: Ambulatory Visit | Attending: Obstetrics and Gynecology | Admitting: Obstetrics and Gynecology

## 2015-04-22 DIAGNOSIS — N6452 Nipple discharge: Secondary | ICD-10-CM

## 2015-04-22 DIAGNOSIS — D242 Benign neoplasm of left breast: Secondary | ICD-10-CM | POA: Diagnosis not present

## 2015-04-22 DIAGNOSIS — N6320 Unspecified lump in the left breast, unspecified quadrant: Secondary | ICD-10-CM

## 2015-04-22 DIAGNOSIS — N63 Unspecified lump in breast: Secondary | ICD-10-CM | POA: Diagnosis not present

## 2015-04-23 ENCOUNTER — Other Ambulatory Visit: Payer: Self-pay | Admitting: Obstetrics and Gynecology

## 2015-04-23 DIAGNOSIS — N6452 Nipple discharge: Secondary | ICD-10-CM

## 2015-04-23 DIAGNOSIS — N63 Unspecified lump in unspecified breast: Secondary | ICD-10-CM

## 2015-04-25 ENCOUNTER — Ambulatory Visit
Admission: RE | Admit: 2015-04-25 | Discharge: 2015-04-25 | Disposition: A | Discharge status: Home / Self Care | Payer: Medicare Other | Source: Ambulatory Visit | Attending: Obstetrics and Gynecology | Admitting: Obstetrics and Gynecology

## 2015-04-25 ENCOUNTER — Other Ambulatory Visit: Payer: Self-pay | Admitting: Obstetrics and Gynecology

## 2015-04-25 DIAGNOSIS — N6452 Nipple discharge: Secondary | ICD-10-CM

## 2015-04-25 DIAGNOSIS — N63 Unspecified lump in unspecified breast: Secondary | ICD-10-CM

## 2015-04-25 DIAGNOSIS — D242 Benign neoplasm of left breast: Secondary | ICD-10-CM | POA: Diagnosis not present

## 2015-04-29 ENCOUNTER — Ambulatory Visit: Payer: Self-pay | Admitting: Surgery

## 2015-04-29 DIAGNOSIS — D242 Benign neoplasm of left breast: Secondary | ICD-10-CM

## 2015-04-29 NOTE — H&P (Signed)
[name] 04/29/2015 1:37 PM Location: [facility] Patient #: [id number] DOB: [birth year] Divorced / Language: [name] / Race: Black or African American Female  History of Present Illness Chelsea Moores A. Rolland Steinert MD; 04/29/2015 2:17 PM) Patient words: Patient sent at the request of Dr. Marin Dunn for left breast papilloma 2. She was found at 4:00 and 9:00 of her left breast to have mammographic and ultrasonographic abnormality and a history of left breast nipple discharge which is been clear to cloudy. Denies any significant blood. Denies any breast mass bilaterally. No history of breast cancer. Core biopsy was done which shows intraductal papilloma at both sites in the left breast.         Breast, left, needle core biopsy, lower outer quadrant 4:00 o'clock PAPILLOMA Microscopic Comment [facility] has been informed (04/26/2015). Chelsea Lanius MD Pathologist, Electronic Signature (Case signed 04/26/2015) [name] DATA: Post biopsy mammogram of the left breast for clip placement.  EXAM: DIAGNOSTIC LEFT MAMMOGRAM POST ULTRASOUND BIOPSY  COMPARISON: Previous exam(s).  FINDINGS: Mammographic images were obtained following ultrasound guided biopsy of mass in the left breast at 4 o'clock. The coil shaped biopsy marking clip is appropriately positioned at the intended site of biopsy in the left breast.  IMPRESSION: Coil shaped biopsy marking clip is appropriately positioned at the intended site of biopsy in the left breast at 4 o'clock.  Final Assessment: Post Procedure Mammograms for Marker Placement   Electronically Signed By: [name] M.D. On: 04/25/2015 11:06      Breast, left, needle core biopsy, lower outer quadrant 4:00 o'clock PAPILLOMA Microscopic      Breast, left, needle core biopsy, 9:00 o'clock, 2 CMFN - INTRADUCTAL PAPILLOMA. - SEE COMMENT. Microscopic Comment The results were  calledpecimen.  The patient is a 54 year old female.   Allergies [name], CMA; 04/29/2015 1:40 PM) Amitriptyline HCl *CHEMICALS* Swelling. Depakote *ANTICONVULSANTS* Swelling. Haldol *ANTIPSYCHOTICS/ANTIMANIC AGENTS* Swelling. Iodine (Antiseptic) *ANTISEPTICS & DISINFECTANTS* Anaphylaxis, Swelling. Penicillin V *PENICILLINS* SEROquel *ANTIPSYCHOTICS/ANTIMANIC AGENTS* [place] Venom Protein *Biologicals Misc** Gabapentin *CHEMICALS* Rash. Stadol *ANALGESICS - OPIOID* Rash.  Medication History [name], [place]; 04/29/2015 1:43 PM) Hydrocodone-Acetaminophen (10-325MG  Tablet, Oral) Active. ALPRAZolam (0.5MG  Tablet, Oral) Active. Cyclobenzaprine HCl (10MG  Tablet, Oral) Active. Fluticasone Propionate (50MCG/ACT Suspension, Nasal) Active. Advair Diskus (500-50MCG/DOSE Aero Pow Br Act, Inhalation) Active. Atarax (50MG  Tablet, Oral) Active. Metoprolol Succinate ER (50MG  Tablet ER 24HR, Oral) Active. TraZODone HCl (100MG  Tablet, Oral) Active. Trifluoperazine HCl (2MG  Tablet, Oral) Active. Topamax (50MG  Tablet, Oral) Active. Pepcid (20MG  Tablet, Oral) Active. Medications Reconciled     Review of Systems [name] CMA; 04/29/2015 1:37 PM) General Present- Fatigue, Night Sweats and Weight Gain. Not Present- Appetite Loss, Chills, Fever and Weight Loss. HEENT Present- Seasonal Allergies, Sinus Pain and Wears glasses/contact lenses. Not Present- Earache, Hearing Loss, Hoarseness, Nose Bleed, Oral Ulcers, Ringing in the Ears, Sore Throat, Visual Disturbances and Yellow Eyes. Respiratory Present- Difficulty Breathing, Snoring and Wheezing. Not Present- Bloody sputum and Chronic Cough. Breast Present- Breast Pain and Nipple Discharge. Not Present- Breast Mass and Skin Changes. Cardiovascular Present- Difficulty Breathing Lying Down and Shortness of Breath. Not Present- Chest Pain, Leg Cramps, Palpitations, Rapid Heart Rate and Swelling of  Extremities. Gastrointestinal Present- Abdominal Pain, Constipation and Difficulty Swallowing. Not Present- Bloating, Bloody Stool, Change in Bowel Habits, Chronic diarrhea, Excessive gas, Gets full quickly at meals, Hemorrhoids, Indigestion, Nausea, Rectal Pain and Vomiting. Female Genitourinary Present- Pelvic Pain. Not Present- Frequency, Nocturia, Painful Urination and Urgency. Musculoskeletal Present- Back  Pain, Joint Pain, Joint Stiffness, Muscle Pain, Muscle Weakness and Swelling of Extremities. Neurological Present- Decreased Memory, Headaches, Numbness, Seizures, Tingling, Trouble walking and Weakness. Not Present- Fainting and Tremor. Psychiatric Present- Anxiety and Change in Sleep Pattern. Not Present- Bipolar, Depression, Fearful and Frequent crying. Endocrine Present- Hot flashes. Not Present- Cold Intolerance, Excessive Hunger, Hair Changes, Heat Intolerance and New Diabetes.  Vitals [name] CMA; 04/29/2015 1:43 PM) 04/29/2015 1:43 PM Weight: 258 lb Height: 69in Body Surface Area: 2.3 m Body Mass Index: 38.1 kg/m  Temp.: 97.55F(Temporal)  Pulse: 80 (Regular)  BP: 132/78 (Sitting, Left Arm, Standard)      Physical Exam (Chelsea Littles A. Maleni Seyer MD; 04/29/2015 2:17 PM)  General Mental Status-Alert. General Appearance-Consistent with stated age. Hydration-Well hydrated. Voice-Normal.  Head and Neck Head-normocephalic, atraumatic with no lesions or palpable masses. Trachea-midline. Thyroid Gland Characteristics - normal size and consistency.  Eye Eyeball - Bilateral-Extraocular movements intact. Sclera/Conjunctiva - Bilateral-No scleral icterus.  Chest and Lung Exam Chest and lung exam reveals -quiet, even and easy respiratory effort with no use of accessory muscles and on auscultation, normal breath sounds, no adventitious sounds and normal vocal resonance. Inspection Chest Wall - Normal. Back - normal.  Breast Breast -  Left-Symmetric, Non Tender, No Biopsy scars, no Dimpling, No Inflammation, No Lumpectomy scars, No Mastectomy scars, No Peau d' Orange. Breast - Right-Symmetric, Non Tender, No Biopsy scars, no Dimpling, No Inflammation, No Lumpectomy scars, No Mastectomy scars, No Peau d' Orange. Breast Lump-No Palpable Breast Mass.  Cardiovascular Cardiovascular examination reveals -normal heart sounds, regular rate and rhythm with no murmurs and normal pedal pulses bilaterally.  Abdomen Inspection Inspection of the abdomen reveals - No Hernias. Skin - Scar - no surgical scars. Palpation/Percussion Palpation and Percussion of the abdomen reveal - Soft, Non Tender, No Rebound tenderness, No Rigidity (guarding) and No hepatosplenomegaly. Auscultation Auscultation of the abdomen reveals - Bowel sounds normal.  Neurologic Neurologic evaluation reveals -alert and oriented x 3 with no impairment of recent or remote memory. Mental Status-Normal.  Musculoskeletal Normal Exam - Left-Upper Extremity Strength Normal and Lower Extremity Strength Normal. Normal Exam - Right-Upper Extremity Strength Normal and Lower Extremity Strength Normal.  Lymphatic Head & Neck  General Head & Neck Lymphatics: Bilateral - Description - Normal. Axillary  General Axillary Region: Bilateral - Description - Normal. Tenderness - Non Tender. Femoral & Inguinal  Generalized Femoral & Inguinal Lymphatics: Bilateral - Description - Normal. Tenderness - Non Tender.    Assessment & Plan (Delno Blaisdell A. Ary Lavine MD; 04/29/2015 2:18 PM)  INTRADUCTAL PAPILLOMA OF BREAST, LEFT (D24.2) Impression: Left breast papilloma 2  Recommend needle localized lumpectomy of both sites. Risk of malignancy is 3%. Discussed the procedure with her as well as potential complications, long-term expectations and outcomes. Nonoperative measures discussed. She agrees to proceed with excision of both papilloma in her left breast. Risk of  lumpectomy include bleeding, infection, seroma, more surgery, use of seed/wire, wound care, cosmetic deformity and the need for other treatments, death , blood clots, death. Pt agrees to proceed.  Current Plans Pt Education - [facility] Breast Biopsy HCI: discussed with patient and provided information. The anatomy and the physiology was discussed. The pathophysiology and natural history of the disease was discussed. Options were discussed and recommendations were made. Technique, risks, benefits, & alternatives were discussed. Risks such as stroke, heart attack, bleeding, indection, death, and other risks discussed. Questions answered. The patient agrees to proceed. You are being scheduled for surgery - Our schedulers will call you.  You should hear from our office's scheduling department within 5 working days about the location, date, and time of surgery. We try to make accommodations for patient's preferences in scheduling surgery, but sometimes the OR schedule or the surgeon's schedule prevents [place] from making those accommodations.  If you have not heard from our office [phone]) in 5 working days, call the office and ask for your surgeon's nurse.  If you have other questions about your diagnosis, plan, or surgery, call the office and ask for your surgeon's nurse.  Pt Education - Pamphlet Given - Breast Biopsy: discussed with patient and provided information. We discussed the staging and pathophysiology of breast cancer. We discussed all of the different options for treatment for breast cancer including surgery, chemotherapy, radiation therapy, Herceptin, and antiestrogen therapy. We discussed a sentinel lymph node biopsy as she does not appear to having lymph node involvement right now. We discussed the performance of that with injection of radioactive tracer and blue dye. We discussed that she would have an incision underneath her axillary hairline. We discussed that there is a bout a 10-20% chance  of having a positive node with a sentinel lymph node biopsy and we will await the permanent pathology to make any other first further decisions in terms of her treatment. One of these options might be to return to the operating room to perform an axillary lymph node dissection. We discussed about a 1-2% risk lifetime of chronic shoulder pain as well as lymphedema associated with a sentinel lymph node biopsy. We discussed the options for treatment of the breast cancer which included lumpectomy versus a mastectomy. We discussed the performance of the lumpectomy with a wire placement. We discussed a 10-20% chance of a positive margin requiring reexcision in the operating room. We also discussed that she may need radiation therapy or antiestrogen therapy or both if she undergoes lumpectomy. We discussed the mastectomy and the postoperative care for that as well. We discussed that there is no difference in her survival whether she undergoes lumpectomy with radiation therapy or antiestrogen therapy versus a mastectomy. There is a slight difference in the local recurrence rate being 3-5% with lumpectomy and about 1% with a mastectomy. We discussed the risks of operation including bleeding, infection, possible reoperation. She understands her further therapy will be based on what her stages at the time of her operation.  Pt Education - [facility] Breast Biopsy HCI: discussed with patient and provided information.

## 2015-05-07 DIAGNOSIS — M797 Fibromyalgia: Secondary | ICD-10-CM | POA: Diagnosis not present

## 2015-05-07 DIAGNOSIS — M5137 Other intervertebral disc degeneration, lumbosacral region: Secondary | ICD-10-CM | POA: Diagnosis not present

## 2015-05-07 DIAGNOSIS — M533 Sacrococcygeal disorders, not elsewhere classified: Secondary | ICD-10-CM | POA: Diagnosis not present

## 2015-05-07 DIAGNOSIS — M4727 Other spondylosis with radiculopathy, lumbosacral region: Secondary | ICD-10-CM | POA: Diagnosis not present

## 2015-05-07 DIAGNOSIS — M5416 Radiculopathy, lumbar region: Secondary | ICD-10-CM | POA: Diagnosis not present

## 2015-05-29 ENCOUNTER — Encounter (HOSPITAL_BASED_OUTPATIENT_CLINIC_OR_DEPARTMENT_OTHER): Payer: Self-pay | Admitting: *Deleted

## 2015-05-29 NOTE — Progress Notes (Addendum)
Bring all medications. Pack an overnight bag to stay due to Sleep apnea and uses only O2 at 2liters/min at night ( NO CPAP), Pt is coming Tuesday for BMET and EKG. Requested notes from Dr. 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

## 2015-06-04 ENCOUNTER — Encounter (HOSPITAL_BASED_OUTPATIENT_CLINIC_OR_DEPARTMENT_OTHER)
Admission: RE | Admit: 2015-06-04 | Discharge: 2015-06-04 | Disposition: A | Discharge status: Home / Self Care | Payer: Medicare Other | Source: Ambulatory Visit | Attending: Surgery | Admitting: Surgery

## 2015-06-04 ENCOUNTER — Other Ambulatory Visit: Payer: Self-pay

## 2015-06-04 DIAGNOSIS — Z91040 Latex allergy status: Secondary | ICD-10-CM | POA: Diagnosis not present

## 2015-06-04 DIAGNOSIS — Z91010 Allergy to peanuts: Secondary | ICD-10-CM | POA: Diagnosis not present

## 2015-06-04 DIAGNOSIS — N641 Fat necrosis of breast: Secondary | ICD-10-CM | POA: Diagnosis not present

## 2015-06-04 DIAGNOSIS — K219 Gastro-esophageal reflux disease without esophagitis: Secondary | ICD-10-CM | POA: Diagnosis not present

## 2015-06-04 DIAGNOSIS — Z90710 Acquired absence of both cervix and uterus: Secondary | ICD-10-CM | POA: Diagnosis not present

## 2015-06-04 DIAGNOSIS — J45909 Unspecified asthma, uncomplicated: Secondary | ICD-10-CM | POA: Diagnosis not present

## 2015-06-04 DIAGNOSIS — Z880 Allergy status to penicillin: Secondary | ICD-10-CM | POA: Diagnosis not present

## 2015-06-04 DIAGNOSIS — F319 Bipolar disorder, unspecified: Secondary | ICD-10-CM | POA: Diagnosis not present

## 2015-06-04 DIAGNOSIS — I10 Essential (primary) hypertension: Secondary | ICD-10-CM | POA: Diagnosis not present

## 2015-06-04 DIAGNOSIS — Z91030 Bee allergy status: Secondary | ICD-10-CM | POA: Diagnosis not present

## 2015-06-04 DIAGNOSIS — Z79899 Other long term (current) drug therapy: Secondary | ICD-10-CM | POA: Diagnosis not present

## 2015-06-04 DIAGNOSIS — Z91013 Allergy to seafood: Secondary | ICD-10-CM | POA: Diagnosis not present

## 2015-06-04 DIAGNOSIS — D242 Benign neoplasm of left breast: Secondary | ICD-10-CM | POA: Diagnosis not present

## 2015-06-04 DIAGNOSIS — M797 Fibromyalgia: Secondary | ICD-10-CM | POA: Diagnosis not present

## 2015-06-04 DIAGNOSIS — N6092 Unspecified benign mammary dysplasia of left breast: Secondary | ICD-10-CM | POA: Diagnosis not present

## 2015-06-04 DIAGNOSIS — M1990 Unspecified osteoarthritis, unspecified site: Secondary | ICD-10-CM | POA: Diagnosis not present

## 2015-06-04 DIAGNOSIS — F418 Other specified anxiety disorders: Secondary | ICD-10-CM | POA: Diagnosis not present

## 2015-06-04 DIAGNOSIS — N6012 Diffuse cystic mastopathy of left breast: Secondary | ICD-10-CM | POA: Diagnosis not present

## 2015-06-04 DIAGNOSIS — Z9981 Dependence on supplemental oxygen: Secondary | ICD-10-CM | POA: Diagnosis not present

## 2015-06-04 DIAGNOSIS — G35 Multiple sclerosis: Secondary | ICD-10-CM | POA: Diagnosis not present

## 2015-06-04 DIAGNOSIS — G4733 Obstructive sleep apnea (adult) (pediatric): Secondary | ICD-10-CM | POA: Diagnosis not present

## 2015-06-04 DIAGNOSIS — D649 Anemia, unspecified: Secondary | ICD-10-CM | POA: Diagnosis not present

## 2015-06-04 DIAGNOSIS — Z8249 Family history of ischemic heart disease and other diseases of the circulatory system: Secondary | ICD-10-CM | POA: Diagnosis not present

## 2015-06-04 DIAGNOSIS — Z8041 Family history of malignant neoplasm of ovary: Secondary | ICD-10-CM | POA: Diagnosis not present

## 2015-06-04 LAB — CBC WITH DIFFERENTIAL/PLATELET
Basophils Absolute: 0.1 10*3/uL (ref 0.0–0.1)
Basophils Relative: 1 %
Eosinophils Absolute: 0.4 10*3/uL (ref 0.0–0.7)
Eosinophils Relative: 7 %
HCT: 42.9 % (ref 36.0–46.0)
Hemoglobin: 14 g/dL (ref 12.0–15.0)
Lymphocytes Relative: 55 %
Lymphs Abs: 3.2 10*3/uL (ref 0.7–4.0)
MCH: 28.2 pg (ref 26.0–34.0)
MCHC: 32.6 g/dL (ref 30.0–36.0)
MCV: 86.3 fL (ref 78.0–100.0)
Monocytes Absolute: 0.4 10*3/uL (ref 0.1–1.0)
Monocytes Relative: 7 %
Neutro Abs: 1.7 10*3/uL (ref 1.7–7.7)
Neutrophils Relative %: 30 %
Platelets: 306 10*3/uL (ref 150–400)
RBC: 4.97 MIL/uL (ref 3.87–5.11)
RDW: 14.9 % (ref 11.5–15.5)
WBC: 5.8 10*3/uL (ref 4.0–10.5)

## 2015-06-04 LAB — COMPREHENSIVE METABOLIC PANEL
ALT: 24 U/L (ref 14–54)
AST: 27 U/L (ref 15–41)
Albumin: 3.7 g/dL (ref 3.5–5.0)
Alkaline Phosphatase: 54 U/L (ref 38–126)
Anion gap: 6 (ref 5–15)
BUN: 7 mg/dL (ref 6–20)
CO2: 32 mmol/L (ref 22–32)
Calcium: 9.3 mg/dL (ref 8.9–10.3)
Chloride: 104 mmol/L (ref 101–111)
Creatinine, Ser: 0.79 mg/dL (ref 0.44–1.00)
GFR calc Af Amer: >60 mL/min (ref >60)
GFR calc non Af Amer: >60 mL/min (ref >60)
Glucose, Bld: 100 mg/dL — ABNORMAL HIGH (ref 65–99)
Potassium: 3.8 mmol/L (ref 3.5–5.1)
Sodium: 142 mmol/L (ref 135–145)
Total Bilirubin: 0.3 mg/dL (ref 0.3–1.2)
Total Protein: 7.4 g/dL (ref 6.5–8.1)

## 2015-06-06 ENCOUNTER — Encounter (HOSPITAL_BASED_OUTPATIENT_CLINIC_OR_DEPARTMENT_OTHER)
Admission: RE | Disposition: A | Discharge status: Home / Self Care | Payer: Self-pay | Source: Ambulatory Visit | Attending: Surgery

## 2015-06-06 ENCOUNTER — Encounter (HOSPITAL_BASED_OUTPATIENT_CLINIC_OR_DEPARTMENT_OTHER): Payer: Self-pay | Admitting: Certified Registered"

## 2015-06-06 ENCOUNTER — Ambulatory Visit
Admit: 2015-06-06 | Discharge: 2015-06-06 | Disposition: A | Discharge status: Home / Self Care | Payer: Medicare Other | Attending: Surgery | Admitting: Surgery

## 2015-06-06 ENCOUNTER — Ambulatory Visit
Admission: RE | Admit: 2015-06-06 | Discharge: 2015-06-06 | Disposition: A | Discharge status: Home / Self Care | Payer: Medicare Other | Source: Ambulatory Visit | Attending: Surgery | Admitting: Surgery

## 2015-06-06 ENCOUNTER — Ambulatory Visit (HOSPITAL_BASED_OUTPATIENT_CLINIC_OR_DEPARTMENT_OTHER)
Admission: RE | Admit: 2015-06-06 | Discharge: 2015-06-06 | Disposition: A | Discharge status: Home / Self Care | Payer: Medicare Other | Source: Ambulatory Visit | Attending: Surgery | Admitting: Surgery

## 2015-06-06 ENCOUNTER — Ambulatory Visit (HOSPITAL_BASED_OUTPATIENT_CLINIC_OR_DEPARTMENT_OTHER): Payer: Medicare Other | Admitting: Anesthesiology

## 2015-06-06 ENCOUNTER — Other Ambulatory Visit: Payer: Self-pay

## 2015-06-06 DIAGNOSIS — I10 Essential (primary) hypertension: Secondary | ICD-10-CM | POA: Insufficient documentation

## 2015-06-06 DIAGNOSIS — Z91030 Bee allergy status: Secondary | ICD-10-CM | POA: Insufficient documentation

## 2015-06-06 DIAGNOSIS — M797 Fibromyalgia: Secondary | ICD-10-CM | POA: Insufficient documentation

## 2015-06-06 DIAGNOSIS — N6012 Diffuse cystic mastopathy of left breast: Secondary | ICD-10-CM | POA: Diagnosis not present

## 2015-06-06 DIAGNOSIS — Z91040 Latex allergy status: Secondary | ICD-10-CM | POA: Diagnosis not present

## 2015-06-06 DIAGNOSIS — Z91013 Allergy to seafood: Secondary | ICD-10-CM | POA: Insufficient documentation

## 2015-06-06 DIAGNOSIS — N641 Fat necrosis of breast: Secondary | ICD-10-CM | POA: Diagnosis not present

## 2015-06-06 DIAGNOSIS — G35 Multiple sclerosis: Secondary | ICD-10-CM | POA: Insufficient documentation

## 2015-06-06 DIAGNOSIS — Z90710 Acquired absence of both cervix and uterus: Secondary | ICD-10-CM | POA: Insufficient documentation

## 2015-06-06 DIAGNOSIS — Z79899 Other long term (current) drug therapy: Secondary | ICD-10-CM | POA: Insufficient documentation

## 2015-06-06 DIAGNOSIS — F319 Bipolar disorder, unspecified: Secondary | ICD-10-CM | POA: Insufficient documentation

## 2015-06-06 DIAGNOSIS — Z8249 Family history of ischemic heart disease and other diseases of the circulatory system: Secondary | ICD-10-CM | POA: Insufficient documentation

## 2015-06-06 DIAGNOSIS — Z853 Personal history of malignant neoplasm of breast: Secondary | ICD-10-CM | POA: Diagnosis not present

## 2015-06-06 DIAGNOSIS — G4733 Obstructive sleep apnea (adult) (pediatric): Secondary | ICD-10-CM | POA: Insufficient documentation

## 2015-06-06 DIAGNOSIS — Z9012 Acquired absence of left breast and nipple: Secondary | ICD-10-CM | POA: Diagnosis not present

## 2015-06-06 DIAGNOSIS — N6082 Other benign mammary dysplasias of left breast: Secondary | ICD-10-CM | POA: Diagnosis not present

## 2015-06-06 DIAGNOSIS — Z01818 Encounter for other preprocedural examination: Secondary | ICD-10-CM | POA: Diagnosis not present

## 2015-06-06 DIAGNOSIS — D242 Benign neoplasm of left breast: Secondary | ICD-10-CM | POA: Diagnosis not present

## 2015-06-06 DIAGNOSIS — N6092 Unspecified benign mammary dysplasia of left breast: Secondary | ICD-10-CM | POA: Insufficient documentation

## 2015-06-06 DIAGNOSIS — J45909 Unspecified asthma, uncomplicated: Secondary | ICD-10-CM | POA: Insufficient documentation

## 2015-06-06 DIAGNOSIS — M1990 Unspecified osteoarthritis, unspecified site: Secondary | ICD-10-CM | POA: Insufficient documentation

## 2015-06-06 DIAGNOSIS — Z880 Allergy status to penicillin: Secondary | ICD-10-CM | POA: Insufficient documentation

## 2015-06-06 DIAGNOSIS — K219 Gastro-esophageal reflux disease without esophagitis: Secondary | ICD-10-CM | POA: Insufficient documentation

## 2015-06-06 DIAGNOSIS — D649 Anemia, unspecified: Secondary | ICD-10-CM | POA: Insufficient documentation

## 2015-06-06 DIAGNOSIS — Z9981 Dependence on supplemental oxygen: Secondary | ICD-10-CM | POA: Insufficient documentation

## 2015-06-06 DIAGNOSIS — Z91010 Allergy to peanuts: Secondary | ICD-10-CM | POA: Insufficient documentation

## 2015-06-06 DIAGNOSIS — F418 Other specified anxiety disorders: Secondary | ICD-10-CM | POA: Insufficient documentation

## 2015-06-06 DIAGNOSIS — R928 Other abnormal and inconclusive findings on diagnostic imaging of breast: Secondary | ICD-10-CM | POA: Diagnosis not present

## 2015-06-06 DIAGNOSIS — Z8041 Family history of malignant neoplasm of ovary: Secondary | ICD-10-CM | POA: Insufficient documentation

## 2015-06-06 HISTORY — DX: Bipolar disorder, unspecified: F31.9

## 2015-06-06 HISTORY — PX: BREAST LUMPECTOMY WITH NEEDLE LOCALIZATION: SHX5759

## 2015-06-06 SURGERY — BREAST LUMPECTOMY WITH NEEDLE LOCALIZATION
Anesthesia: General | Site: Breast | Laterality: Left

## 2015-06-06 MED ORDER — FENTANYL CITRATE (PF) 100 MCG/2ML IJ SOLN
50.0000 ug | INTRAMUSCULAR | Status: AC | PRN
  Administered 2015-06-06 (×4): 50 ug via INTRAVENOUS

## 2015-06-06 MED ORDER — CLINDAMYCIN PHOSPHATE 300 MG/50ML IV SOLN
300.0000 mg | Freq: Once | INTRAVENOUS | Status: AC
  Administered 2015-06-06: 300 mg via INTRAVENOUS

## 2015-06-06 MED ORDER — DEXAMETHASONE SODIUM PHOSPHATE 4 MG/ML IJ SOLN
INTRAMUSCULAR | Status: DC | PRN
  Administered 2015-06-06: 10 mg via INTRAVENOUS

## 2015-06-06 MED ORDER — CHLORHEXIDINE GLUCONATE 4 % EX LIQD: 1.0000 "application " | Freq: Once | CUTANEOUS | Status: DC

## 2015-06-06 MED ORDER — ONDANSETRON 8 MG PO TBDP
8.0000 mg | ORAL_TABLET | Freq: Once | ORAL | Status: AC
  Administered 2015-06-06: 8 mg via ORAL

## 2015-06-06 MED ORDER — HYDROMORPHONE HCL 1 MG/ML IJ SOLN
INTRAMUSCULAR | Status: AC
  Filled 2015-06-06: qty 1

## 2015-06-06 MED ORDER — PROPOFOL 10 MG/ML IV BOLUS
INTRAVENOUS | Status: AC
  Filled 2015-06-06: qty 20

## 2015-06-06 MED ORDER — SCOPOLAMINE 1 MG/3DAYS TD PT72: 1.0000 | MEDICATED_PATCH | TRANSDERMAL | Status: DC

## 2015-06-06 MED ORDER — ONDANSETRON HCL 4 MG/2ML IJ SOLN
INTRAMUSCULAR | Status: AC
  Filled 2015-06-06: qty 2

## 2015-06-06 MED ORDER — DEXAMETHASONE SODIUM PHOSPHATE 10 MG/ML IJ SOLN
INTRAMUSCULAR | Status: AC
  Filled 2015-06-06: qty 1

## 2015-06-06 MED ORDER — LACTATED RINGERS IV SOLN
INTRAVENOUS | Status: DC
  Administered 2015-06-06: 11:00:00 via INTRAVENOUS
  Administered 2015-06-06: 10 mL/h via INTRAVENOUS

## 2015-06-06 MED ORDER — PROPOFOL 10 MG/ML IV BOLUS
INTRAVENOUS | Status: DC | PRN
  Administered 2015-06-06: 200 mg via INTRAVENOUS

## 2015-06-06 MED ORDER — HYDROMORPHONE HCL 1 MG/ML IJ SOLN
0.2500 mg | INTRAMUSCULAR | Status: DC | PRN
  Administered 2015-06-06 (×2): 0.5 mg via INTRAVENOUS

## 2015-06-06 MED ORDER — MIDAZOLAM HCL 2 MG/2ML IJ SOLN
1.0000 mg | INTRAMUSCULAR | Status: DC | PRN
  Administered 2015-06-06: 2 mg via INTRAVENOUS

## 2015-06-06 MED ORDER — BUPIVACAINE-EPINEPHRINE 0.25% -1:200000 IJ SOLN
INTRAMUSCULAR | Status: DC | PRN
  Administered 2015-06-06: 20 mL

## 2015-06-06 MED ORDER — ONDANSETRON 8 MG PO TBDP
ORAL_TABLET | ORAL | Status: AC
  Filled 2015-06-06: qty 1

## 2015-06-06 MED ORDER — FENTANYL CITRATE (PF) 100 MCG/2ML IJ SOLN
INTRAMUSCULAR | Status: AC
  Filled 2015-06-06: qty 2

## 2015-06-06 MED ORDER — GLYCOPYRROLATE 0.2 MG/ML IJ SOLN: 0.2000 mg | Freq: Once | INTRAMUSCULAR | Status: DC | PRN

## 2015-06-06 MED ORDER — CLINDAMYCIN PHOSPHATE 300 MG/50ML IV SOLN
INTRAVENOUS | Status: AC
  Filled 2015-06-06: qty 50

## 2015-06-06 MED ORDER — OXYCODONE-ACETAMINOPHEN 5-325 MG PO TABS: 1.0000 | ORAL_TABLET | ORAL | Status: DC | PRN

## 2015-06-06 MED ORDER — LIDOCAINE HCL (CARDIAC) 20 MG/ML IV SOLN
INTRAVENOUS | Status: AC
  Filled 2015-06-06: qty 5

## 2015-06-06 MED ORDER — MIDAZOLAM HCL 2 MG/2ML IJ SOLN
INTRAMUSCULAR | Status: AC
  Filled 2015-06-06: qty 2

## 2015-06-06 MED ORDER — SCOPOLAMINE 1 MG/3DAYS TD PT72
1.0000 | MEDICATED_PATCH | Freq: Once | TRANSDERMAL | Status: DC | PRN
Start: 2015-06-06 — End: 2015-06-06

## 2015-06-06 MED ORDER — LIDOCAINE HCL (CARDIAC) 20 MG/ML IV SOLN
INTRAVENOUS | Status: DC | PRN
  Administered 2015-06-06: 100 mg via INTRAVENOUS

## 2015-06-06 MED ORDER — PROMETHAZINE HCL 25 MG/ML IJ SOLN: 6.2500 mg | INTRAMUSCULAR | Status: DC | PRN

## 2015-06-06 MED ORDER — ONDANSETRON HCL 4 MG/2ML IJ SOLN
INTRAMUSCULAR | Status: DC | PRN
  Administered 2015-06-06: 4 mg via INTRAVENOUS

## 2015-06-06 MED ORDER — 0.9 % SODIUM CHLORIDE (POUR BTL) OPTIME
TOPICAL | Status: DC | PRN
  Administered 2015-06-06: 200 mL

## 2015-06-06 SURGICAL SUPPLY — 49 items
APPLIER CLIP 11 MED OPEN (CLIP)
APPLIER CLIP 9.375 MED OPEN (MISCELLANEOUS)
APR CLP MED 11 20 MLT OPN (CLIP)
APR CLP MED 9.3 20 MLT OPN (MISCELLANEOUS)
BINDER BREAST LRG (GAUZE/BANDAGES/DRESSINGS) IMPLANT
BINDER BREAST MEDIUM (GAUZE/BANDAGES/DRESSINGS) IMPLANT
BINDER BREAST XLRG (GAUZE/BANDAGES/DRESSINGS) IMPLANT
BINDER BREAST XXLRG (GAUZE/BANDAGES/DRESSINGS) ×1 IMPLANT
BIOPATCH RED 1 DISK 7.0 (GAUZE/BANDAGES/DRESSINGS) IMPLANT
BLADE SURG 15 STRL LF DISP TIS (BLADE) ×1 IMPLANT
BLADE SURG 15 STRL SS (BLADE) ×2
CANISTER SUCT 1200ML W/VALVE (MISCELLANEOUS) ×2 IMPLANT
CHLORAPREP W/TINT 26ML (MISCELLANEOUS) ×2 IMPLANT
CLIP APPLIE 11 MED OPEN (CLIP) IMPLANT
CLIP APPLIE 9.375 MED OPEN (MISCELLANEOUS) IMPLANT
COVER BACK TABLE 60X90IN (DRAPES) ×2 IMPLANT
COVER MAYO STAND STRL (DRAPES) ×2 IMPLANT
DECANTER SPIKE VIAL GLASS SM (MISCELLANEOUS) ×1 IMPLANT
DEVICE DUBIN W/COMP PLATE 8390 (MISCELLANEOUS) ×2 IMPLANT
DRAIN CHANNEL 19F RND (DRAIN) IMPLANT
DRAPE LAPAROSCOPIC ABDOMINAL (DRAPES) ×1 IMPLANT
DRAPE LAPAROTOMY 100X72 PEDS (DRAPES) ×2 IMPLANT
DRAPE UTILITY XL STRL (DRAPES) ×2 IMPLANT
ELECT COATED BLADE 2.86 ST (ELECTRODE) ×2 IMPLANT
ELECT REM PT RETURN 9FT ADLT (ELECTROSURGICAL) ×2
ELECTRODE REM PT RTRN 9FT ADLT (ELECTROSURGICAL) ×1 IMPLANT
EVACUATOR SILICONE 100CC (DRAIN) IMPLANT
GLOVE BIOGEL PI IND STRL 8 (GLOVE) ×1 IMPLANT
GLOVE BIOGEL PI INDICATOR 8 (GLOVE) ×1
GLOVE ECLIPSE 8.0 STRL XLNG CF (GLOVE) ×2 IMPLANT
GOWN STRL REUS W/ TWL LRG LVL3 (GOWN DISPOSABLE) ×2 IMPLANT
GOWN STRL REUS W/TWL LRG LVL3 (GOWN DISPOSABLE) ×4
KIT MARKER MARGIN INK (KITS) IMPLANT
LIQUID BAND (GAUZE/BANDAGES/DRESSINGS) ×2 IMPLANT
NDL HYPO 25X1 1.5 SAFETY (NEEDLE) ×1 IMPLANT
NEEDLE HYPO 25X1 1.5 SAFETY (NEEDLE) ×2 IMPLANT
NS IRRIG 1000ML POUR BTL (IV SOLUTION) ×2 IMPLANT
PACK BASIN DAY SURGERY FS (CUSTOM PROCEDURE TRAY) ×2 IMPLANT
PENCIL BUTTON HOLSTER BLD 10FT (ELECTRODE) ×2 IMPLANT
SLEEVE SCD COMPRESS KNEE MED (MISCELLANEOUS) ×2 IMPLANT
SPONGE LAP 4X18 X RAY DECT (DISPOSABLE) ×1 IMPLANT
SUT MON AB 4-0 PC3 18 (SUTURE) ×2 IMPLANT
SUT SILK 2 0 SH (SUTURE) IMPLANT
SUT VICRYL 3-0 CR8 SH (SUTURE) ×1 IMPLANT
SYR CONTROL 10ML LL (SYRINGE) ×2 IMPLANT
TOWEL OR 17X24 6PK STRL BLUE (TOWEL DISPOSABLE) ×3 IMPLANT
TOWEL OR NON WOVEN STRL DISP B (DISPOSABLE) ×2 IMPLANT
TUBE CONNECTING 20X1/4 (TUBING) ×2 IMPLANT
YANKAUER SUCT BULB TIP NO VENT (SUCTIONS) ×2 IMPLANT

## 2015-06-06 NOTE — Discharge Instructions (Signed)
[facility] °Office Phone Number [phone] ° °BREAST BIOPSY/ PARTIAL MASTECTOMY: POST OP INSTRUCTIONS ° °Always review your discharge instruction sheet given to you by the facility where your surgery was performed. ° °IF YOU HAVE DISABILITY OR FAMILY LEAVE FORMS, YOU MUST BRING THEM TO THE OFFICE FOR PROCESSING.  DO NOT GIVE THEM TO YOUR DOCTOR. ° °1. A prescription for pain medication may be given to you upon discharge.  Take your pain medication as prescribed, if needed.  If narcotic pain medicine is not needed, then you may take acetaminophen (Tylenol) or ibuprofen (Advil) as needed. °2. Take your usually prescribed medications unless otherwise directed °3. If you need a refill on your pain medication, please contact your pharmacy.  They will contact our office to request authorization.  Prescriptions will not be filled after 5pm or on week-ends. °4. You should eat very light the first 24 hours after surgery, such as soup, crackers, pudding, etc.  Resume your normal diet the day after surgery. °5. Most patients will experience some swelling and bruising in the breast.  Ice packs and a good support bra will help.  Swelling and bruising can take several days to resolve.  °6. It is common to experience some constipation if taking pain medication after surgery.  Increasing fluid intake and taking a stool softener will usually help or prevent this problem from occurring.  A mild laxative (Milk of Magnesia or Miralax) should be taken according to package directions if there are no bowel movements after 48 hours. °7. Unless discharge instructions indicate otherwise, you may remove your bandages 24-48 hours after surgery, and you may shower at that time.  You may have steri-strips (small skin tapes) in place directly over the incision.  These strips should be left on the skin for 7-10 days.  If your surgeon used skin glue on the incision, you may shower in 24 hours.  The glue will flake off over the  next 2-3 weeks.  Any sutures or staples will be removed at the office during your follow-up visit. °8. ACTIVITIES:  You may resume regular daily activities (gradually increasing) beginning the next day.  Wearing a good support bra or sports bra minimizes pain and swelling.  You may have sexual intercourse when it is comfortable. °a. You may drive when you no longer are taking prescription pain medication, you can comfortably wear a seatbelt, and you can safely maneuver your car and apply brakes. °b. RETURN TO WORK:  ______________________________________________________________________________________ °9. You should see your doctor in the office for a follow-up appointment approximately two weeks after your surgery.  Your doctor’s nurse will typically make your follow-up appointment when she calls you with your pathology report.  Expect your pathology report 2-3 business days after your surgery.  You may call to check if you do not hear from us after three days. °10. OTHER INSTRUCTIONS: _______________________________________________________________________________________________ _____________________________________________________________________________________________________________________________________ °_____________________________________________________________________________________________________________________________________ °_____________________________________________________________________________________________________________________________________ ° °WHEN TO CALL YOUR DOCTOR: °1. Fever over 101.0 °2. Nausea and/or vomiting. °3. Extreme swelling or bruising. °4. Continued bleeding from incision. °5. Increased pain, redness, or drainage from the incision. ° °The clinic staff is available to answer your questions during regular business hours.  Please don’t hesitate to call and ask to speak to one of the nurses for clinical concerns.  If you have a medical emergency, go to the nearest  emergency room or call 911.  A surgeon from [facility] is always on call at the hospital. ° °For further questions, please visit [email or web address]  ° ° ° °  Post Anesthesia Home Care Instructions ° °Activity: °Get plenty of rest for the remainder of the day. A responsible adult should stay with you for 24 hours following the procedure.  °For the next 24 hours, DO NOT: °-Drive a car °-Operate machinery °-Drink alcoholic beverages °-Take any medication unless instructed by your physician °-Make any legal decisions or sign important papers. ° °Meals: °Start with liquid foods such as gelatin or soup. Progress to regular foods as tolerated. Avoid greasy, spicy, heavy foods. If nausea and/or vomiting occur, drink only clear liquids until the nausea and/or vomiting subsides. Call your physician if vomiting continues. ° °Special Instructions/Symptoms: °Your throat may feel dry or sore from the anesthesia or the breathing tube placed in your throat during surgery. If this causes discomfort, gargle with warm salt water. The discomfort should disappear within 24 hours. ° °If you had a scopolamine patch placed behind your ear for the management of post- operative nausea and/or vomiting: ° °1. The medication in the patch is effective for 72 hours, after which it should be removed.  Wrap patch in a tissue and discard in the trash. Wash hands thoroughly with soap and water. °2. You may remove the patch earlier than 72 hours if you experience unpleasant side effects which may include dry mouth, dizziness or visual disturbances. °3. Avoid touching the patch. Wash your hands with soap and water after contact with the patch. °  ° °

## 2015-06-06 NOTE — Transfer of Care (Signed)
Immediate Anesthesia Transfer of Care Note  Patient: [name]  Procedure(s) Performed: Procedure(s): LEFT BREAST LUMPECTOMY WITH NEEDLE LOCALIZATION TIMES TWO (Left)  Patient Location: PACU  Anesthesia Type:General  Level of Consciousness: awake and patient cooperative  Airway & Oxygen Therapy: Patient Spontanous Breathing and Patient connected to face mask oxygen  Post-op Assessment: Report given to RN and Post -op Vital signs reviewed and stable  Post vital signs: Reviewed and stable  Last Vitals:  Filed Vitals:   06/06/15 1025  BP: 158/98  Pulse: 85  Temp: 36.8 C  Resp: 18    Complications: No apparent anesthesia complications

## 2015-06-06 NOTE — Op Note (Signed)
Preoperative diagnosis: Left breast papilloma 2  Postoperative diagnosis: Same  Procedure: Left breast wire localized lumpectomy 2  Surgeon: Erroll Luna M.D.  Anesthesia: LMA with 0.25% Sensorcaine local  EBL: Minimal  Specimen left medial wire and clip with breast tissue  to pathology and left lateral wire and clip to pathology with breast tissue  Drains: None  Indications for procedure: The patient presents for left breast wire localized lumpectomy 2 after undergoing mammogram with biopsy that showed 2 papilloma one centrally and medially and the second laterally inferiorly. Discussed small risk malignancy in the setting I recommended excision. She agreed to proceed.The procedure has been discussed with the patient. Alternatives to surgery have been discussed with the patient.  Risks of surgery include bleeding,  Infection,  Seroma formation, death,  and the need for further surgery.   The patient understands and wishes to proceed.  Description of procedure: The patient was met in the holding area and left breast was marked as correct side. Questions are answered. Wires   were in place. she was taken back to the operating room placed upon the OR table. After induction of general anesthesia, left breast was prepped and draped in a sterile fashion. Timeout was done. The medial wire was addressed first. 0.25% Sensorcaine was infiltrated around the skin of the medial nipple areolar complex. Curvilinear incision was made along the border and dissection was carried down and all tissue around the tip of the wire was excised. Radiograph revealed the wire to be intact and the clip to be present. In a similar fashion the left lateral inferior lesion was excised. Curvilinear incision was made around the wire. All tissue around the wire was excised in its entirety. Radiograph revealed the wire clipped be present the specimen. Both cavities irrigated made hemostatic. Both closed with 3-0 Vicryl and  4-0 Monocryl. Degrees applied to both incisions. All final counts are found to be correct. Patient was awoke, extubated taken to recovery in satisfactory condition.

## 2015-06-06 NOTE — H&P (Signed)
H&P   [name] (MR# [id number])      H&P Info    Author Note Status Last Update User Last Update Date/Time   Erroll Luna, MD Signed Erroll Luna, MD 04/29/2015 2:18 PM    H&P    Expand All Collapse All   honda C. [facility] 04/29/2015 1:37 PM Location: [facility] Patient #: [id number] DOB: 1960-08-04 Divorced / Language: [name] / Race: Black or African American Female  History of Present Illness Marcello Moores A. Woodroe Vogan MD; 04/29/2015 2:17 PM) Patient words: Patient sent at the request of Dr. Marin Olp for left breast papilloma 2. She was found at 4:00 and 9:00 of her left breast to have mammographic and ultrasonographic abnormality and a history of left breast nipple discharge which is been clear to cloudy. Denies any significant blood. Denies any breast mass bilaterally. No history of breast cancer. Core biopsy was done which shows intraductal papilloma at both sites in the left breast.         Breast, left, needle core biopsy, lower outer quadrant 4:00 o'clock PAPILLOMA Microscopic Comment [facility] has been informed (04/26/2015). [name] MD Pathologist, Electronic Signature (Case signed 04/26/2015) [name] DATA: Post biopsy mammogram of the left breast for clip placement.  EXAM: DIAGNOSTIC LEFT MAMMOGRAM POST ULTRASOUND BIOPSY  COMPARISON: Previous exam(s).  FINDINGS: Mammographic images were obtained following ultrasound guided biopsy of mass in the left breast at 4 o'clock. The coil shaped biopsy marking clip is appropriately positioned at the intended site of biopsy in the left breast.  IMPRESSION: Coil shaped biopsy marking clip is appropriately positioned at the intended site of biopsy in the left breast at 4 o'clock.  Final Assessment: Post Procedure Mammograms for Marker Placement   Electronically Signed By: [name] M.D. On: 04/25/2015 11:06      Breast, left, needle  core biopsy, lower outer quadrant 4:00 o'clock PAPILLOMA Microscopic      Breast, left, needle core biopsy, 9:00 o'clock, 2 CMFN - INTRADUCTAL PAPILLOMA. - SEE COMMENT. Microscopic Comment The results were calledpecimen.  The patient is a 54 year old female.   Allergies [name], CMA; 04/29/2015 1:40 PM) Amitriptyline HCl *CHEMICALS* Swelling. Depakote *ANTICONVULSANTS* Swelling. Haldol *ANTIPSYCHOTICS/ANTIMANIC AGENTS* Swelling. Iodine (Antiseptic) *ANTISEPTICS & DISINFECTANTS* Anaphylaxis, Swelling. Penicillin V *PENICILLINS* SEROquel *ANTIPSYCHOTICS/ANTIMANIC AGENTS* [place] Venom Protein *Biologicals Misc** Gabapentin *CHEMICALS* Rash. Stadol *ANALGESICS - OPIOID* Rash.  Medication History [name], [place]; 04/29/2015 1:43 PM) Hydrocodone-Acetaminophen (10-325MG  Tablet, Oral) Active. ALPRAZolam (0.5MG  Tablet, Oral) Active. Cyclobenzaprine HCl (10MG  Tablet, Oral) Active. Fluticasone Propionate (50MCG/ACT Suspension, Nasal) Active. Advair Diskus (500-50MCG/DOSE Aero Pow Br Act, Inhalation) Active. Atarax (50MG  Tablet, Oral) Active. Metoprolol Succinate ER (50MG  Tablet ER 24HR, Oral) Active. TraZODone HCl (100MG  Tablet, Oral) Active. Trifluoperazine HCl (2MG  Tablet, Oral) Active. Topamax (50MG  Tablet, Oral) Active. Pepcid (20MG  Tablet, Oral) Active. Medications Reconciled     Review of Systems [name] CMA; 04/29/2015 1:37 PM) General Present- Fatigue, Night Sweats and Weight Gain. Not Present- Appetite Loss, Chills, Fever and Weight Loss. HEENT Present- Seasonal Allergies, Sinus Pain and Wears glasses/contact lenses. Not Present- Earache, Hearing Loss, Hoarseness, Nose Bleed, Oral Ulcers, Ringing in the Ears, Sore Throat, Visual Disturbances and Yellow Eyes. Respiratory Present- Difficulty Breathing, Snoring and Wheezing. Not Present- Bloody sputum and Chronic Cough. Breast Present- Breast Pain and Nipple Discharge. Not Present-  Breast Mass and Skin Changes. Cardiovascular Present- Difficulty Breathing Lying Down and Shortness of Breath. Not Present- Chest Pain, Leg Cramps, Palpitations, Rapid Heart Rate and  Swelling of Extremities. Gastrointestinal Present- Abdominal Pain, Constipation and Difficulty Swallowing. Not Present- Bloating, Bloody Stool, Change in Bowel Habits, Chronic diarrhea, Excessive gas, Gets full quickly at meals, Hemorrhoids, Indigestion, Nausea, Rectal Pain and Vomiting. Female Genitourinary Present- Pelvic Pain. Not Present- Frequency, Nocturia, Painful Urination and Urgency. Musculoskeletal Present- Back Pain, Joint Pain, Joint Stiffness, Muscle Pain, Muscle Weakness and Swelling of Extremities. Neurological Present- Decreased Memory, Headaches, Numbness, Seizures, Tingling, Trouble walking and Weakness. Not Present- Fainting and Tremor. Psychiatric Present- Anxiety and Change in Sleep Pattern. Not Present- Bipolar, Depression, Fearful and Frequent crying. Endocrine Present- Hot flashes. Not Present- Cold Intolerance, Excessive Hunger, Hair Changes, Heat Intolerance and New Diabetes.  Vitals [name] CMA; 04/29/2015 1:43 PM) 04/29/2015 1:43 PM Weight: 258 lb Height: 69in Body Surface Area: 2.3 m Body Mass Index: 38.1 kg/m  Temp.: 97.79F(Temporal)  Pulse: 80 (Regular)  BP: 132/78 (Sitting, Left Arm, Standard)      Physical Exam (Casilda Pickerill A. Antonino Nienhuis MD; 04/29/2015 2:17 PM)  General Mental Status-Alert. General Appearance-Consistent with stated age. Hydration-Well hydrated. Voice-Normal.  Head and Neck Head-normocephalic, atraumatic with no lesions or palpable masses. Trachea-midline. Thyroid Gland Characteristics - normal size and consistency.  Eye Eyeball - Bilateral-Extraocular movements intact. Sclera/Conjunctiva - Bilateral-No scleral icterus.  Chest and Lung Exam Chest and lung exam reveals -quiet, even and easy respiratory effort with no use  of accessory muscles and on auscultation, normal breath sounds, no adventitious sounds and normal vocal resonance. Inspection Chest Wall - Normal. Back - normal.  Breast Breast - Left-Symmetric, Non Tender, No Biopsy scars, no Dimpling, No Inflammation, No Lumpectomy scars, No Mastectomy scars, No Peau d' Orange. Breast - Right-Symmetric, Non Tender, No Biopsy scars, no Dimpling, No Inflammation, No Lumpectomy scars, No Mastectomy scars, No Peau d' Orange. Breast Lump-No Palpable Breast Mass.  Cardiovascular Cardiovascular examination reveals -normal heart sounds, regular rate and rhythm with no murmurs and normal pedal pulses bilaterally.  Abdomen Inspection Inspection of the abdomen reveals - No Hernias. Skin - Scar - no surgical scars. Palpation/Percussion Palpation and Percussion of the abdomen reveal - Soft, Non Tender, No Rebound tenderness, No Rigidity (guarding) and No hepatosplenomegaly. Auscultation Auscultation of the abdomen reveals - Bowel sounds normal.  Neurologic Neurologic evaluation reveals -alert and oriented x 3 with no impairment of recent or remote memory. Mental Status-Normal.  Musculoskeletal Normal Exam - Left-Upper Extremity Strength Normal and Lower Extremity Strength Normal. Normal Exam - Right-Upper Extremity Strength Normal and Lower Extremity Strength Normal.  Lymphatic Head & Neck  General Head & Neck Lymphatics: Bilateral - Description - Normal. Axillary  General Axillary Region: Bilateral - Description - Normal. Tenderness - Non Tender. Femoral & Inguinal  Generalized Femoral & Inguinal Lymphatics: Bilateral - Description - Normal. Tenderness - Non Tender.    Assessment & Plan (Mliss Wedin A. 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 Risk of malignancy is 3%. Discussed the procedure with her as well  as potential complications, long-term expectations and outcomes. Nonoperative measures discussed. She agrees to proceed with excision of both papilloma in her left breast. Risk of lumpectomy include bleeding, infection, seroma, more surgery, use of seed/wire, wound care, cosmetic deformity and the need for other treatments, death , blood clots, death. Pt agrees to proceed.  Current Plans Pt Education - [facility] Breast Biopsy HCI: discussed with patient and provided information. The anatomy and the physiology was discussed. The pathophysiology and natural history  of the disease was discussed. Options were discussed and recommendations were made. Technique, risks, benefits, & alternatives were discussed. Risks such as stroke, heart attack, bleeding, indection, death, and other risks discussed. Questions answered. The patient agrees to proceed. You are being scheduled for surgery - Our schedulers will call you.  You should hear from our office's scheduling department within 5 working days about the location, date, and time of surgery. We try to make accommodations for patient's preferences in scheduling surgery, but sometimes the OR schedule or the surgeon's schedule prevents [place] from making those accommodations.  If you have not heard from our office [phone]) in 5 working days, call the office and ask for your surgeon's nurse.  If you have other questions about your diagnosis, plan, or surgery, call the office and ask for your surgeon's nurse.  Pt Education - Pamphlet Given - Breast Biopsy: discussed with patient and provided information. We discussed the staging and pathophysiology of breast cancer. We discussed all of the different options for treatment for breast cancer including surgery, chemotherapy, radiation therapy, Herceptin, and antiestrogen therapy. We discussed a sentinel lymph node biopsy as she does not appear to having lymph node involvement right now. We discussed the performance of that  with injection of radioactive tracer and blue dye. We discussed that she would have an incision underneath her axillary hairline. We discussed that there is a bout a 10-20% chance of having a positive node with a sentinel lymph node biopsy and we will await the permanent pathology to make any other first further decisions in terms of her treatment. One of these options might be to return to the operating room to perform an axillary lymph node dissection. We discussed about a 1-2% risk lifetime of chronic shoulder pain as well as lymphedema associated with a sentinel lymph node biopsy. We discussed the options for treatment of the breast cancer which included lumpectomy versus a mastectomy. We discussed the performance of the lumpectomy with a wire placement. We discussed a 10-20% chance of a positive margin requiring reexcision in the operating room. We also discussed that she may need radiation therapy or antiestrogen therapy or both if she undergoes lumpectomy. We discussed the mastectomy and the postoperative care for that as well. We discussed that there is no difference in her survival whether she undergoes lumpectomy with radiation therapy or antiestrogen therapy versus a mastectomy. There is a slight difference in the local recurrence rate being 3-5% with lumpectomy and about 1% with a mastectomy. We discussed the risks of operation including bleeding, infection, possible reoperation. She understands her further therapy will be based on what her stages at the time of her operation.  Pt Education - [facility] Breast Biopsy HCI: discussed with patient and provided information.

## 2015-06-06 NOTE — Anesthesia Procedure Notes (Signed)
Procedure Name: LMA Insertion Date/Time: 06/06/2015 10:58 AM Performed by: Denna Haggard D Pre-anesthesia Checklist: Patient identified, Emergency Drugs available, Suction available and Patient being monitored Patient Re-evaluated:Patient Re-evaluated prior to inductionOxygen Delivery Method: Circle System Utilized Preoxygenation: Pre-oxygenation with 100% oxygen Intubation Type: IV induction Ventilation: Mask ventilation without difficulty LMA: LMA inserted LMA Size: 4.0 Number of attempts: 1 Airway Equipment and Method: Bite block Placement Confirmation: positive ETCO2 Tube secured with: Tape Dental Injury: Teeth and Oropharynx as per pre-operative assessment

## 2015-06-06 NOTE — Anesthesia Postprocedure Evaluation (Signed)
Anesthesia Post Note  Patient: [name]  Procedure(s) Performed: Procedure(s) (LRB): LEFT BREAST LUMPECTOMY WITH NEEDLE LOCALIZATION TIMES TWO (Left)  Patient location during evaluation: PACU Anesthesia Type: General Level of consciousness: sedated Pain management: pain level controlled Vital Signs Assessment: post-procedure vital signs reviewed and stable Respiratory status: spontaneous breathing and respiratory function stable Cardiovascular status: stable Anesthetic complications: no    Last Vitals:  Filed Vitals:   06/06/15 1330 06/06/15 1353  BP: 129/88   Pulse: 87 87  Temp:  37.1 C  Resp: 15 16    Last Pain:  Filed Vitals:   06/06/15 1356  PainSc: 0-No pain                 [name]

## 2015-06-06 NOTE — Anesthesia Preprocedure Evaluation (Addendum)
Anesthesia Evaluation  Patient identified by MRN, date of birth, ID band Patient awake    Reviewed: Allergy & Precautions, NPO status , Patient's Chart, lab work & pertinent test results  Airway Mallampati: III  TM Distance: >3 FB Neck ROM: Full    Dental  (+) Teeth Intact, Dental Advisory Given   Pulmonary asthma , sleep apnea , former smoker,    Pulmonary exam normal        Cardiovascular hypertension, Normal cardiovascular exam     Neuro/Psych  Headaches, Seizures -,  PSYCHIATRIC DISORDERS Anxiety Depression Bipolar Disorder  Neuromuscular disease    GI/Hepatic Neg liver ROS, GERD  ,  Endo/Other  Morbid obesity  Renal/GU negative Renal ROS     Musculoskeletal  (+) Arthritis ,   Abdominal   Peds  Hematology   Anesthesia Other Findings   Reproductive/Obstetrics                            Anesthesia Physical Anesthesia Plan  ASA: III  Anesthesia Plan: General   Post-op Pain Management:    Induction: Intravenous  Airway Management Planned: LMA  Additional Equipment:   Intra-op Plan:   Post-operative Plan: Extubation in OR  Informed Consent: I have reviewed the patients History and Physical, chart, labs and discussed the procedure including the risks, benefits and alternatives for the proposed anesthesia with the patient or authorized representative who has indicated his/her understanding and acceptance.   Dental advisory given  Plan Discussed with: CRNA, Anesthesiologist and Surgeon  Anesthesia Plan Comments:         Anesthesia Quick Evaluation

## 2015-06-07 ENCOUNTER — Encounter (HOSPITAL_BASED_OUTPATIENT_CLINIC_OR_DEPARTMENT_OTHER): Payer: Self-pay | Admitting: Surgery

## 2015-07-16 DIAGNOSIS — F250 Schizoaffective disorder, bipolar type: Secondary | ICD-10-CM | POA: Diagnosis not present

## 2015-07-17 DIAGNOSIS — M4727 Other spondylosis with radiculopathy, lumbosacral region: Secondary | ICD-10-CM | POA: Diagnosis not present

## 2015-07-21 ENCOUNTER — Encounter (HOSPITAL_COMMUNITY): Payer: Self-pay | Admitting: *Deleted

## 2015-07-21 ENCOUNTER — Emergency Department (HOSPITAL_COMMUNITY): Payer: Medicare Other

## 2015-07-21 ENCOUNTER — Emergency Department (HOSPITAL_COMMUNITY)
Admission: EM | Admit: 2015-07-21 | Discharge: 2015-07-21 | Disposition: A | Discharge status: Home / Self Care | Payer: Medicare Other | Attending: Emergency Medicine | Admitting: Emergency Medicine

## 2015-07-21 DIAGNOSIS — Y998 Other external cause status: Secondary | ICD-10-CM | POA: Diagnosis not present

## 2015-07-21 DIAGNOSIS — M25461 Effusion, right knee: Secondary | ICD-10-CM | POA: Diagnosis not present

## 2015-07-21 DIAGNOSIS — F319 Bipolar disorder, unspecified: Secondary | ICD-10-CM | POA: Diagnosis not present

## 2015-07-21 DIAGNOSIS — Z862 Personal history of diseases of the blood and blood-forming organs and certain disorders involving the immune mechanism: Secondary | ICD-10-CM | POA: Diagnosis not present

## 2015-07-21 DIAGNOSIS — Z91040 Latex allergy status: Secondary | ICD-10-CM | POA: Insufficient documentation

## 2015-07-21 DIAGNOSIS — S93401A Sprain of unspecified ligament of right ankle, initial encounter: Secondary | ICD-10-CM | POA: Insufficient documentation

## 2015-07-21 DIAGNOSIS — F419 Anxiety disorder, unspecified: Secondary | ICD-10-CM | POA: Insufficient documentation

## 2015-07-21 DIAGNOSIS — W108XXA Fall (on) (from) other stairs and steps, initial encounter: Secondary | ICD-10-CM | POA: Insufficient documentation

## 2015-07-21 DIAGNOSIS — S8991XA Unspecified injury of right lower leg, initial encounter: Secondary | ICD-10-CM | POA: Diagnosis not present

## 2015-07-21 DIAGNOSIS — Z87891 Personal history of nicotine dependence: Secondary | ICD-10-CM | POA: Insufficient documentation

## 2015-07-21 DIAGNOSIS — M1990 Unspecified osteoarthritis, unspecified site: Secondary | ICD-10-CM | POA: Insufficient documentation

## 2015-07-21 DIAGNOSIS — J45909 Unspecified asthma, uncomplicated: Secondary | ICD-10-CM | POA: Insufficient documentation

## 2015-07-21 DIAGNOSIS — Y9389 Activity, other specified: Secondary | ICD-10-CM | POA: Diagnosis not present

## 2015-07-21 DIAGNOSIS — Z880 Allergy status to penicillin: Secondary | ICD-10-CM | POA: Diagnosis not present

## 2015-07-21 DIAGNOSIS — I10 Essential (primary) hypertension: Secondary | ICD-10-CM | POA: Insufficient documentation

## 2015-07-21 DIAGNOSIS — S8391XA Sprain of unspecified site of right knee, initial encounter: Secondary | ICD-10-CM | POA: Diagnosis not present

## 2015-07-21 DIAGNOSIS — Y9289 Other specified places as the place of occurrence of the external cause: Secondary | ICD-10-CM | POA: Insufficient documentation

## 2015-07-21 DIAGNOSIS — S99911A Unspecified injury of right ankle, initial encounter: Secondary | ICD-10-CM | POA: Diagnosis not present

## 2015-07-21 DIAGNOSIS — Z79899 Other long term (current) drug therapy: Secondary | ICD-10-CM | POA: Insufficient documentation

## 2015-07-21 DIAGNOSIS — S83401A Sprain of unspecified collateral ligament of right knee, initial encounter: Secondary | ICD-10-CM | POA: Diagnosis not present

## 2015-07-21 DIAGNOSIS — W19XXXA Unspecified fall, initial encounter: Secondary | ICD-10-CM

## 2015-07-21 MED ORDER — OXYCODONE-ACETAMINOPHEN 5-325 MG PO TABS
1.0000 | ORAL_TABLET | Freq: Once | ORAL | Status: AC
  Administered 2015-07-21: 1 via ORAL
  Filled 2015-07-21: qty 1

## 2015-07-21 NOTE — ED Notes (Signed)
Pt to xray

## 2015-07-21 NOTE — ED Notes (Signed)
Pt c/o R knee pain post falling down 10 steps today, denies hitting head, denies LOC, pt c/o R knee pain at this time, pt c/o R ankle pain with swelling present, no obvious deformity, A&O x4

## 2015-07-21 NOTE — ED Provider Notes (Signed)
CSN: [id number]     Arrival date & time 07/21/15  1216 History   First MD Initiated Contact with Patient 07/21/15 1313     Chief Complaint  Patient presents with  . Fall  . Knee Pain     (Consider location/radiation/quality/duration/timing/severity/associated sxs/prior Treatment) Patient is a 55 y.o. female presenting with fall, knee pain, and ankle pain. The history is provided by the patient.  Fall This is a new problem. The current episode started today. Associated symptoms include joint swelling.  Knee Pain Ankle Pain Location:  Ankle Injury: yes   Mechanism of injury: fall   Fall:    Fall occurred:  Down stairs   Entrapped after fall: no   Ankle location:  R ankle Pain details:    Radiates to:  R leg   Severity:  Severe   Onset quality:  Sudden   Timing:  Constant   Progression:  Worsening Chronicity:  New Dislocation: no   Foreign body present:  No foreign bodies Worsened by:  Bearing weight  [name] is a 55 y.o. female with hx of MS, fibromyalgia and multiple other chronic health problems as listed in [place]. She presents to the ED with right knee and ankle pain and swelling s/p fall down 10 steps approximately 12 hours prior to arrival to the ED. The steps had carpet. Patient reports getting up and starting down steps, fell and landed on a step near the bottom. She states that her right knee twisted and then she turned her ankle. She denies hitting her head or LOC. She denies any other injuries. She complains of pain in the back and inner aspect of the right knee and pain to the sides and back of her right ankle. Patient is in pain management clinic and takes hydrocodone. She took a hydrocodone after the fall and it eased the pain so she could sleep but when she got up this morning and tried to walk the pain was worse so she called a friend to bring her to the ED.   Past Medical History  Diagnosis Date  . Anemia   . Anxiety state, unspecified   . Backache,  unspecified   . Depressive disorder, not elsewhere classified   . Esophageal reflux   . Other abnormal glucose     05-19-13 no problems now  . Fibromyalgia   . Arthropathy, unspecified, site unspecified   . MS (multiple sclerosis) ([place])     remission at present(past hx. bedridden) -ambulates with cane-left side weaker  . Arthritis   . Hypertension   . Obstructive sleep apnea (adult) (pediatric)     no cpap-uses Oxygen nightly 2l/m   . On home oxygen therapy 05-19-13    nightly at 2l/m  . Asthma   . Bipolar disorder ([place])   . Seizures ([place])     last seizure 2014   Past Surgical History  Procedure Laterality Date  . Appendectomy    . Cholecystectomy    . Tubal ligation    . Vaginal hysterectomy    . Hernia repair      umbilical  . Cesarean section  1988    twins  . Colonoscopy N/A 06/12/2013    Procedure: COLONOSCOPY;  Surgeon: Irene Shipper, MD;  Location: [facility];  Service: Endoscopy;  Laterality: N/A;  . Breast lumpectomy with needle localization Left 06/06/2015    Procedure: LEFT BREAST LUMPECTOMY WITH NEEDLE LOCALIZATION TIMES TWO;  Surgeon: Erroll Luna, MD;  Location: [facility];  Service: General;  Laterality: Left;  . Breast surgery     Family History  Problem Relation Age of Onset  . Ovarian cancer Mother   . Heart disease Father   . Colon cancer Neg Hx   . Esophageal cancer Neg Hx   . Stomach cancer Neg Hx   . Rectal cancer Neg Hx    Social History  Substance Use Topics  . Smoking status: Former Smoker -- 1.00 packs/day for 5 years    Quit date: 07/07/2007  . Smokeless tobacco: Never Used  . Alcohol Use: No   OB History    Gravida Para Term Preterm AB TAB SAB Ectopic Multiple Living   2 2 1 1     1 3      Review of Systems  Musculoskeletal: Positive for joint swelling.       Right knee pain, right ankle pain.  all other systems negative    Allergies  Bee venom; Depakote; Latex; Peanut-containing drug products; Penicillins;  Seroquel; Shellfish-derived products; Amitriptyline hcl; Butorphanol tartrate; Gabapentin; and Iodine  Home Medications   Prior to Admission medications   Medication Sig Start Date End Date Taking? Authorizing Provider  ADVAIR DISKUS 500-50 MCG/DOSE AEPB USE ONE INHALATION BY MOUTH EVERY 12 HOURS FOR ASTHMA    Deneise Lever, MD  ALPRAZolam Duanne Moron) 0.5 MG tablet Take 1 tablet by mouth 3 (three) times daily as needed for anxiety or sleep.  10/02/12   Historical Provider, MD  cyclobenzaprine (FLEXERIL) 10 MG tablet Take 10 mg by mouth 3 (three) times daily as needed for muscle spasms.    Historical Provider, MD  EPINEPHrine (EPI-PEN) 0.3 mg/0.3 mL DEVI Inject 0.3 mg into the muscle once. As needed for severe allergic reaction 04/07/12   Mellissa Kohut, MD  famotidine (PEPCID) 20 MG tablet Take 1 tablet (20 mg total) by mouth 2 (two) times daily. For stomach acid. 04/07/12   Mellissa Kohut, MD  HYDROcodone-acetaminophen (NORCO) 10-325 MG per tablet Take 1 tablet by mouth every 6 (six) hours as needed for moderate pain or severe pain.  12/29/12   Historical Provider, MD  lamoTRIgine (LAMICTAL) 25 MG tablet Take 75 mg by mouth 2 (two) times daily.    Historical Provider, MD  levETIRAcetam (KEPPRA) 500 MG tablet Take 1 tablet (500 mg total) by mouth 2 (two) times daily. 10/09/13   Drema Dallas, DO  metoprolol succinate (TOPROL-XL) 50 MG 24 hr tablet Take 50 mg by mouth at bedtime. Take with or immediately following a meal for blood pressure control. 04/07/12   Mellissa Kohut, MD  Multiple Vitamin (MULTIVITAMIN WITH MINERALS) TABS Take 1 tablet by mouth daily. For nutritional supplementation. 04/07/12   Mellissa Kohut, MD  oxyCODONE-acetaminophen (ROXICET) 5-325 MG tablet Take 1-2 tablets by mouth every 4 (four) hours as needed. 06/06/15   Erroll Luna, MD  PROAIR HFA 108 (90 BASE) MCG/ACT inhaler INHALE 2 PUFFS BY MOUTH EVERY 4 HOURS AS NEEDED FOR WHEEZING OR SHORTNESS OF BREATH    Clinton D Young, MD   traZODone (DESYREL) 150 MG tablet Take 150 mg by mouth at bedtime.    Historical Provider, MD  trifluoperazine (STELAZINE) 2 MG tablet Take 2 mg by mouth at bedtime.    Historical Provider, MD   BP 131/85 mmHg  Pulse 94  Temp(Src) 97.8 F (36.6 C) (Oral)  Resp 18  Ht 5\' 9"  (1.753 m)  Wt 113.399 kg  BMI 36.90 kg/m2  SpO2 94% Physical Exam  Constitutional: She  is oriented to person, place, and time. No distress.  obese  HENT:  Head: Normocephalic and atraumatic.  Right Ear: Tympanic membrane normal.  Left Ear: Tympanic membrane normal.  Nose: Nose normal.  Mouth/Throat: Uvula is midline, oropharynx is clear and moist and mucous membranes are normal.  Eyes: Conjunctivae and EOM are normal. Pupils are equal, round, and reactive to light.  Neck: Normal range of motion. Neck supple.  Cardiovascular: Normal rate and regular rhythm.   Pulmonary/Chest: Effort normal and breath sounds normal. No respiratory distress.  Abdominal: Soft. There is no tenderness.  Musculoskeletal:       Right knee: She exhibits swelling. She exhibits normal range of motion, no ecchymosis, no deformity, no laceration, no erythema, normal alignment and normal patellar mobility. Tenderness found.       Right ankle: She exhibits normal range of motion, no deformity, no laceration and normal pulse. Swelling: minimal. Tenderness. Lateral malleolus and medial malleolus tenderness found. Achilles tendon exhibits pain. Achilles tendon exhibits no defect and normal Thompson's test results.       Legs: Full passive range of motion right ankle, knee and hip without difficulty.   Neurological: She is alert and oriented to person, place, and time. She has normal strength. No cranial nerve deficit or sensory deficit.  Skin: Skin is warm and dry.  Psychiatric: She has a normal mood and affect. Her behavior is normal.  Nursing note and vitals reviewed.   ED Course  Procedures (including critical care time) Labs Review Labs  Reviewed - No data to display  Imaging Review Dg Ankle Complete Right  07/21/2015  CLINICAL DATA:  [address] down 10 stairs injuring RIGHT ankle EXAM: RIGHT ANKLE - COMPLETE 3+ VIEW COMPARISON:  None FINDINGS: Diffuse soft tissue swelling at RIGHT ankle. Osseous mineralization normal. Joint spaces preserved. No acute fracture, dislocation or bone destruction. IMPRESSION: No acute osseous abnormalities. Electronically Signed   By: Lavonia Dana M.D.   On: 07/21/2015 13:53   Dg Knee Complete 4 Views Right  07/21/2015  CLINICAL DATA:  Fall down stairs. Right knee injury and pain. Initial encounter. EXAM: RIGHT KNEE - COMPLETE 4+ VIEW COMPARISON:  01/09/2009 FINDINGS: No evidence of fracture or dislocation. Small knee joint effusion noted. Mild tricompartmental degenerative spurring seen, without joint space narrowing. No other bone abnormality identified. IMPRESSION: Small knee joint effusion.  No evidence of fracture. Mild tricompartmental degenerative spurring. Electronically Signed   By: Earle Gell M.D.   On: 07/21/2015 13:53    MDM  55 y.o. female with right knee and ankle pain s/p fall 12 hours prior to arrival to the ED stable for d/c without focal neuro deficits. ASO and knee brace applied. She will continue to take her hydrocodone at home and follow up with ortho. Discussed with the patient and all questioned fully answered. She will return if any problems arise.   Final diagnoses:  Ankle sprain, right, initial encounter  Right knee sprain, initial encounter  Fall, initial encounter       [facility], NP 07/21/15 [place], MD 07/22/15 1555

## 2015-07-21 NOTE — Discharge Instructions (Signed)
Follow up with the orthopedic doctor if symptoms persist. Return here as needed.

## 2015-07-24 DIAGNOSIS — M25561 Pain in right knee: Secondary | ICD-10-CM | POA: Diagnosis not present

## 2015-07-24 DIAGNOSIS — M25571 Pain in right ankle and joints of right foot: Secondary | ICD-10-CM | POA: Diagnosis not present

## 2015-08-02 DIAGNOSIS — M25571 Pain in right ankle and joints of right foot: Secondary | ICD-10-CM | POA: Diagnosis not present

## 2015-08-02 DIAGNOSIS — M25561 Pain in right knee: Secondary | ICD-10-CM | POA: Diagnosis not present

## 2015-08-30 DIAGNOSIS — M25562 Pain in left knee: Secondary | ICD-10-CM | POA: Diagnosis not present

## 2015-08-30 DIAGNOSIS — M25561 Pain in right knee: Secondary | ICD-10-CM | POA: Diagnosis not present

## 2015-09-12 DIAGNOSIS — M4727 Other spondylosis with radiculopathy, lumbosacral region: Secondary | ICD-10-CM | POA: Diagnosis not present

## 2015-09-12 DIAGNOSIS — M5137 Other intervertebral disc degeneration, lumbosacral region: Secondary | ICD-10-CM | POA: Diagnosis not present

## 2015-09-12 DIAGNOSIS — M5416 Radiculopathy, lumbar region: Secondary | ICD-10-CM | POA: Diagnosis not present

## 2015-09-12 DIAGNOSIS — Z5181 Encounter for therapeutic drug level monitoring: Secondary | ICD-10-CM | POA: Diagnosis not present

## 2015-09-12 DIAGNOSIS — M797 Fibromyalgia: Secondary | ICD-10-CM | POA: Diagnosis not present

## 2015-10-07 DIAGNOSIS — F250 Schizoaffective disorder, bipolar type: Secondary | ICD-10-CM | POA: Diagnosis not present

## 2015-11-21 DIAGNOSIS — M797 Fibromyalgia: Secondary | ICD-10-CM | POA: Diagnosis not present

## 2015-11-21 DIAGNOSIS — M4727 Other spondylosis with radiculopathy, lumbosacral region: Secondary | ICD-10-CM | POA: Diagnosis not present

## 2015-11-21 DIAGNOSIS — M5137 Other intervertebral disc degeneration, lumbosacral region: Secondary | ICD-10-CM | POA: Diagnosis not present

## 2015-12-13 DIAGNOSIS — M4727 Other spondylosis with radiculopathy, lumbosacral region: Secondary | ICD-10-CM | POA: Diagnosis not present

## 2015-12-13 DIAGNOSIS — M5416 Radiculopathy, lumbar region: Secondary | ICD-10-CM | POA: Diagnosis not present

## 2015-12-24 DIAGNOSIS — F250 Schizoaffective disorder, bipolar type: Secondary | ICD-10-CM | POA: Diagnosis not present

## 2016-02-19 DIAGNOSIS — M4727 Other spondylosis with radiculopathy, lumbosacral region: Secondary | ICD-10-CM | POA: Diagnosis not present

## 2016-02-19 DIAGNOSIS — M5416 Radiculopathy, lumbar region: Secondary | ICD-10-CM | POA: Diagnosis not present

## 2016-02-19 DIAGNOSIS — M797 Fibromyalgia: Secondary | ICD-10-CM | POA: Diagnosis not present

## 2016-02-19 DIAGNOSIS — M5137 Other intervertebral disc degeneration, lumbosacral region: Secondary | ICD-10-CM | POA: Diagnosis not present

## 2016-03-19 DIAGNOSIS — F250 Schizoaffective disorder, bipolar type: Secondary | ICD-10-CM | POA: Diagnosis not present

## 2016-03-30 ENCOUNTER — Encounter (HOSPITAL_COMMUNITY): Payer: Self-pay | Admitting: Emergency Medicine

## 2016-03-30 DIAGNOSIS — Z91010 Allergy to peanuts: Secondary | ICD-10-CM | POA: Insufficient documentation

## 2016-03-30 DIAGNOSIS — Z87891 Personal history of nicotine dependence: Secondary | ICD-10-CM | POA: Insufficient documentation

## 2016-03-30 DIAGNOSIS — N390 Urinary tract infection, site not specified: Secondary | ICD-10-CM | POA: Diagnosis not present

## 2016-03-30 DIAGNOSIS — S0990XA Unspecified injury of head, initial encounter: Secondary | ICD-10-CM | POA: Insufficient documentation

## 2016-03-30 DIAGNOSIS — I10 Essential (primary) hypertension: Secondary | ICD-10-CM | POA: Insufficient documentation

## 2016-03-30 DIAGNOSIS — W109XXA Fall (on) (from) unspecified stairs and steps, initial encounter: Secondary | ICD-10-CM | POA: Insufficient documentation

## 2016-03-30 DIAGNOSIS — Y929 Unspecified place or not applicable: Secondary | ICD-10-CM | POA: Insufficient documentation

## 2016-03-30 DIAGNOSIS — J45909 Unspecified asthma, uncomplicated: Secondary | ICD-10-CM | POA: Diagnosis not present

## 2016-03-30 DIAGNOSIS — S79911A Unspecified injury of right hip, initial encounter: Secondary | ICD-10-CM | POA: Diagnosis present

## 2016-03-30 DIAGNOSIS — Z79899 Other long term (current) drug therapy: Secondary | ICD-10-CM | POA: Insufficient documentation

## 2016-03-30 DIAGNOSIS — Z91040 Latex allergy status: Secondary | ICD-10-CM | POA: Diagnosis not present

## 2016-03-30 DIAGNOSIS — S76011A Strain of muscle, fascia and tendon of right hip, initial encounter: Secondary | ICD-10-CM | POA: Insufficient documentation

## 2016-03-30 DIAGNOSIS — S43401A Unspecified sprain of right shoulder joint, initial encounter: Secondary | ICD-10-CM | POA: Insufficient documentation

## 2016-03-30 DIAGNOSIS — Y999 Unspecified external cause status: Secondary | ICD-10-CM | POA: Insufficient documentation

## 2016-03-30 DIAGNOSIS — R079 Chest pain, unspecified: Secondary | ICD-10-CM | POA: Diagnosis not present

## 2016-03-30 DIAGNOSIS — R42 Dizziness and giddiness: Secondary | ICD-10-CM | POA: Diagnosis not present

## 2016-03-30 DIAGNOSIS — Y939 Activity, unspecified: Secondary | ICD-10-CM | POA: Insufficient documentation

## 2016-03-30 DIAGNOSIS — M25511 Pain in right shoulder: Secondary | ICD-10-CM | POA: Diagnosis not present

## 2016-03-30 DIAGNOSIS — R102 Pelvic and perineal pain: Secondary | ICD-10-CM | POA: Diagnosis not present

## 2016-03-30 LAB — COMPREHENSIVE METABOLIC PANEL
ALT: 22 U/L (ref 14–54)
AST: 23 U/L (ref 15–41)
Albumin: 3.6 g/dL (ref 3.5–5.0)
Alkaline Phosphatase: 55 U/L (ref 38–126)
Anion gap: 7 (ref 5–15)
BUN: <5 mg/dL — ABNORMAL LOW (ref 6–20)
CO2: 31 mmol/L (ref 22–32)
Calcium: 9.4 mg/dL (ref 8.9–10.3)
Chloride: 102 mmol/L (ref 101–111)
Creatinine, Ser: 0.79 mg/dL (ref 0.44–1.00)
GFR calc Af Amer: >60 mL/min (ref >60)
GFR calc non Af Amer: >60 mL/min (ref >60)
Glucose, Bld: 93 mg/dL (ref 65–99)
Potassium: 3.2 mmol/L — ABNORMAL LOW (ref 3.5–5.1)
Sodium: 140 mmol/L (ref 135–145)
Total Bilirubin: 0.3 mg/dL (ref 0.3–1.2)
Total Protein: 7.1 g/dL (ref 6.5–8.1)

## 2016-03-30 LAB — CBC
HCT: 42.3 % (ref 36.0–46.0)
Hemoglobin: 13.4 g/dL (ref 12.0–15.0)
MCH: 27.3 pg (ref 26.0–34.0)
MCHC: 31.7 g/dL (ref 30.0–36.0)
MCV: 86.2 fL (ref 78.0–100.0)
Platelets: 268 10*3/uL (ref 150–400)
RBC: 4.91 MIL/uL (ref 3.87–5.11)
RDW: 15.1 % (ref 11.5–15.5)
WBC: 5.5 10*3/uL (ref 4.0–10.5)

## 2016-03-30 NOTE — ED Triage Notes (Signed)
Pt with multiple falls over last week due to dizziness; pt c/o right shoulder pain and left leg pain

## 2016-03-31 ENCOUNTER — Emergency Department (HOSPITAL_COMMUNITY): Payer: Medicare Other

## 2016-03-31 ENCOUNTER — Emergency Department (HOSPITAL_COMMUNITY)
Admission: EM | Admit: 2016-03-31 | Discharge: 2016-03-31 | Disposition: A | Discharge status: Home / Self Care | Payer: Medicare Other | Attending: Emergency Medicine | Admitting: Emergency Medicine

## 2016-03-31 DIAGNOSIS — R102 Pelvic and perineal pain: Secondary | ICD-10-CM | POA: Diagnosis not present

## 2016-03-31 DIAGNOSIS — N390 Urinary tract infection, site not specified: Secondary | ICD-10-CM

## 2016-03-31 DIAGNOSIS — R079 Chest pain, unspecified: Secondary | ICD-10-CM | POA: Diagnosis not present

## 2016-03-31 DIAGNOSIS — R42 Dizziness and giddiness: Secondary | ICD-10-CM | POA: Diagnosis not present

## 2016-03-31 DIAGNOSIS — S0990XA Unspecified injury of head, initial encounter: Secondary | ICD-10-CM

## 2016-03-31 DIAGNOSIS — W19XXXA Unspecified fall, initial encounter: Secondary | ICD-10-CM

## 2016-03-31 DIAGNOSIS — S43401A Unspecified sprain of right shoulder joint, initial encounter: Secondary | ICD-10-CM

## 2016-03-31 DIAGNOSIS — S76011A Strain of muscle, fascia and tendon of right hip, initial encounter: Secondary | ICD-10-CM

## 2016-03-31 DIAGNOSIS — M25511 Pain in right shoulder: Secondary | ICD-10-CM | POA: Diagnosis not present

## 2016-03-31 LAB — URINE MICROSCOPIC-ADD ON

## 2016-03-31 LAB — URINALYSIS, ROUTINE W REFLEX MICROSCOPIC
Bilirubin Urine: NEGATIVE
Glucose, UA: NEGATIVE mg/dL
Ketones, ur: NEGATIVE mg/dL
Nitrite: NEGATIVE
Protein, ur: NEGATIVE mg/dL
Specific Gravity, Urine: 1.005 (ref 1.005–1.030)
pH: 7 (ref 5.0–8.0)

## 2016-03-31 MED ORDER — CIPROFLOXACIN HCL 500 MG PO TABS: 500.0000 mg | ORAL_TABLET | Freq: Two times a day (BID) | ORAL | 0 refills | Status: DC

## 2016-03-31 MED ORDER — POTASSIUM CHLORIDE CRYS ER 20 MEQ PO TBCR
40.0000 meq | EXTENDED_RELEASE_TABLET | Freq: Once | ORAL | Status: AC
  Administered 2016-03-31: 40 meq via ORAL
  Filled 2016-03-31: qty 2

## 2016-03-31 MED ORDER — ACETAMINOPHEN 325 MG PO TABS
650.0000 mg | ORAL_TABLET | Freq: Once | ORAL | Status: AC
  Administered 2016-03-31: 650 mg via ORAL
  Filled 2016-03-31: qty 2

## 2016-03-31 NOTE — ED Notes (Signed)
Dr Christy Gentles made aware of bladder scan results, md advised to give patient water and have her attempt to urinate as soon as she can. Pt provided water and made aware of need for urine sample

## 2016-03-31 NOTE — Discharge Instructions (Signed)
Urine is been sent for culture. Take the Cipro as directed for the next 7 days. Return for any new or worse symptoms. Make an appointment to follow-up with your regular doctor.

## 2016-03-31 NOTE — ED Notes (Signed)
Pt is stable, VS stable, states pain comes and goes, and   "Cannot wait to go home and take her pain pills"

## 2016-03-31 NOTE — ED Provider Notes (Signed)
[facility] DEPT Provider Note   CSN: [id number] Arrival date & time: 03/30/16  1839  By signing my name below, I, Maud Deed. Royston Sinner, attest that this documentation has been prepared under the direction and in the presence of Ripley Fraise, MD.  Electronically Signed: Maud Deed. Royston Sinner, ED Scribe. 03/31/16. 1:32 AM.    History   Chief Complaint Chief Complaint  Patient presents with  . Fall   The history is provided by the patient. No language interpreter was used.  Fall  This is a recurrent problem. The current episode started more than 1 week ago. The problem occurs every several days. The problem has not changed since onset.Pertinent negatives include no chest pain, no abdominal pain, no headaches and no shortness of breath.    HPI Comments: [name] is a 55 y.o. female with a PMHx of HTN and seizures, chronic pain, fibromyalgia, MS,  who presents to the Emergency Department here for recurrent falls sustained over the course of the past week. Most recent fall 2 days ago. Pt states she was walking down the stairs when she slipped resulting in her falling to the ground. She also reports recently attempting to get out of the bath tube, slipping, landing on the bathroom floor. Pt admits she typically experiences episodes of dizziness prior to falls.  She also admits to short episodes of LOC after falls. She now reports constant, unchanged neck pain, R shoulder pain, and R hip pain.  Discomfort is made worse with movement. No alleviating factors at this time. No OTC medications or home remedies attempted prior to arrival. No recent fever, chills, nausea, vomiting, chest pain, or shortness of breath. She denies any recent medication changes.  PCP: Ricke Hey, MD    Past Medical History:  Diagnosis Date  . Anemia   . Anxiety state, unspecified   . Arthritis   . Arthropathy, unspecified, site unspecified   . Asthma   . Backache, unspecified   . Bipolar disorder ([place])   .  Depressive disorder, not elsewhere classified   . Esophageal reflux   . Fibromyalgia   . Hypertension   . MS (multiple sclerosis) ([place])    remission at present(past hx. bedridden) -ambulates with cane-left side weaker  . Obstructive sleep apnea (adult) (pediatric)    no cpap-uses Oxygen nightly 2l/m   . On home oxygen therapy 05-19-13   nightly at 2l/m  . Other abnormal glucose    05-19-13 no problems now  . Seizures ([place])    last seizure 2014    Patient Active Problem List   Diagnosis Date Noted  . Seizure disorder ([place]) 12/30/2013  . Special screening for malignant neoplasms, colon 05/18/2013  . Major depressive disorder, recurrent, severe with psychotic features ([place]) 04/05/2012  . Generalized anxiety disorder 04/05/2012  . Elevated LFTs 10/15/2010  . ABDOMINAL PAIN, UNSPECIFIED 09/27/2009  . Extrinsic asthma, unspecified 06/04/2009  . VOCAL CORD DISORDER 04/02/2009  . PERSONAL HISTORY OF ALLERGY TO SEAFOOD 10/10/2008  . HEADACHE 10/07/2008  . RHINOCONJUNCTIVITIS, ALLERGIC 09/20/2008  . ALLERGY TO INSECTS AND ARACHNIDS 09/20/2008  . DYSPHAGIA PHARYNGOESOPHAGEAL PHASE 09/12/2008  . G E R D 07/05/2008  . ANXIETY 06/13/2008  . DEPRESSION 06/13/2008  . OBSTRUCTIVE SLEEP APNEA 06/13/2008  . ARTHRITIS 06/13/2008  . BACK PAIN, CHRONIC 06/13/2008  . HYPERGLYCEMIA 06/13/2008    Past Surgical History:  Procedure Laterality Date  . APPENDECTOMY    . BREAST LUMPECTOMY WITH NEEDLE LOCALIZATION Left 06/06/2015   Procedure: LEFT BREAST LUMPECTOMY WITH NEEDLE  LOCALIZATION TIMES TWO;  Surgeon: Erroll Luna, MD;  Location: [facility];  Service: General;  Laterality: Left;  . BREAST SURGERY    . CESAREAN SECTION  1988   twins  . CHOLECYSTECTOMY    . COLONOSCOPY N/A 06/12/2013   Procedure: COLONOSCOPY;  Surgeon: Irene Shipper, MD;  Location: [facility];  Service: Endoscopy;  Laterality: N/A;  . HERNIA REPAIR     umbilical  . TUBAL LIGATION    . VAGINAL  HYSTERECTOMY      OB History    Gravida Para Term Preterm AB Living   2 2 1 1   3    SAB TAB Ectopic Multiple Live Births         1         Home Medications    Prior to Admission medications   Medication Sig Start Date End Date Taking? Authorizing Provider  ADVAIR DISKUS 500-50 MCG/DOSE AEPB USE ONE INHALATION BY MOUTH EVERY 12 HOURS FOR ASTHMA    Deneise Lever, MD  ALPRAZolam Duanne Moron) 0.5 MG tablet Take 1 tablet by mouth 3 (three) times daily as needed for anxiety or sleep.  10/02/12   Historical Provider, MD  cyclobenzaprine (FLEXERIL) 10 MG tablet Take 10 mg by mouth 3 (three) times daily as needed for muscle spasms.    Historical Provider, MD  EPINEPHrine (EPI-PEN) 0.3 mg/0.3 mL DEVI Inject 0.3 mg into the muscle once. As needed for severe allergic reaction 04/07/12   Mellissa Kohut, MD  famotidine (PEPCID) 20 MG tablet Take 1 tablet (20 mg total) by mouth 2 (two) times daily. For stomach acid. 04/07/12   Mellissa Kohut, MD  HYDROcodone-acetaminophen (NORCO) 10-325 MG per tablet Take 1 tablet by mouth every 6 (six) hours as needed for moderate pain or severe pain.  12/29/12   Historical Provider, MD  lamoTRIgine (LAMICTAL) 25 MG tablet Take 75 mg by mouth 2 (two) times daily.    Historical Provider, MD  levETIRAcetam (KEPPRA) 500 MG tablet Take 1 tablet (500 mg total) by mouth 2 (two) times daily. 10/09/13   Drema Dallas, DO  metoprolol succinate (TOPROL-XL) 50 MG 24 hr tablet Take 50 mg by mouth at bedtime. Take with or immediately following a meal for blood pressure control. 04/07/12   Mellissa Kohut, MD  Multiple Vitamin (MULTIVITAMIN WITH MINERALS) TABS Take 1 tablet by mouth daily. For nutritional supplementation. 04/07/12   Mellissa Kohut, MD  oxyCODONE-acetaminophen (ROXICET) 5-325 MG tablet Take 1-2 tablets by mouth every 4 (four) hours as needed. 06/06/15   Erroll Luna, MD  PROAIR HFA 108 (90 BASE) MCG/ACT inhaler INHALE 2 PUFFS BY MOUTH EVERY 4 HOURS AS NEEDED FOR  WHEEZING OR SHORTNESS OF BREATH    Clinton D Young, MD  traZODone (DESYREL) 150 MG tablet Take 150 mg by mouth at bedtime.    Historical Provider, MD  trifluoperazine (STELAZINE) 2 MG tablet Take 2 mg by mouth at bedtime.    Historical Provider, MD    Family History Family History  Problem Relation Age of Onset  . Ovarian cancer Mother   . Heart disease Father   . Colon cancer Neg Hx   . Esophageal cancer Neg Hx   . Stomach cancer Neg Hx   . Rectal cancer Neg Hx     Social History Social History  Substance Use Topics  . Smoking status: Former Smoker    Packs/day: 1.00    Years: 5.00    Quit date: 07/07/2007  .  Smokeless tobacco: Never Used  . Alcohol use No     Allergies   Bee venom; Depakote [divalproex sodium]; Latex; Peanut-containing drug products; Penicillins; Seroquel [quetiapine]; Shellfish-derived products; Amitriptyline hcl; Butorphanol tartrate; Gabapentin; and Iodine   Review of Systems Review of Systems  Constitutional: Positive for fatigue. Negative for fever.  Respiratory: Negative for shortness of breath.   Cardiovascular: Negative for chest pain.  Gastrointestinal: Negative for abdominal pain.  Musculoskeletal: Positive for arthralgias and neck pain.  Neurological: Positive for dizziness. Negative for headaches.  All other systems reviewed and are negative.    Physical Exam Updated Vital Signs BP 136/82 (BP Location: Right Arm)   Pulse 77   Temp 98.6 F (37 C) (Oral)   Resp 18   SpO2 96%   Physical Exam  CONSTITUTIONAL: Well developed/well nourished HEAD: Normocephalic/atraumatic EYES: EOMI/PERRL ENMT: Mucous membranes moist NECK: supple no meningeal signs SPINE/BACK:entire spine nontender, No bruising/crepitance/stepoffs noted to spine CV: S1/S2 noted, no murmurs/rubs/gallops noted LUNGS: Lungs are clear to auscultation bilaterally, no apparent distress ABDOMEN: soft, nontender, no rebound or guarding, bowel sounds noted throughout  abdomen GU:no cva tenderness NEURO: Pt is awake/alert/appropriate but is slow to respond, moves all extremitiesx4.  No facial droop. No focal extremity weakness noted EXTREMITIES: pulses normal/equal, full ROM. Tenderness to palpation over R shoulder and tenderness with ROM of the R hip, All other extremities/joints palpated/ranged and nontender  SKIN: warm, color normal PSYCH: no abnormalities of mood noted, alert and oriented to situation   ED Treatments / Results   DIAGNOSTIC STUDIES: Oxygen Saturation is 96% on RA, adequate by my interpretation.    COORDINATION OF CARE: 1:20 AM- Will order blood work and imaging. Discussed treatment plan with pt at bedside and pt agreed to plan.     Labs (all labs ordered are listed, but only abnormal results are displayed) Labs Reviewed  COMPREHENSIVE METABOLIC PANEL - Abnormal; Notable for the following:       Result Value   Potassium 3.2 (*)    BUN <5 (*)    All other components within normal limits  CBC  URINALYSIS, ROUTINE W REFLEX MICROSCOPIC (NOT AT [facility])    EKG  EKG Interpretation  Date/Time:  Tuesday March 31 2016 01:44:09 EDT Ventricular Rate:  76 PR Interval:    QRS Duration: 93 QT Interval:  413 QTC Calculation: 465 R Axis:   53 Text Interpretation:  Sinus rhythm Abnrm T, consider ischemia, anterolateral lds No significant change since last tracing Confirmed by Christy Gentles  MD, [place] ([id number]) on 03/31/2016 2:05:20 AM       Radiology Dg Chest 2 View  Result Date: 03/31/2016 CLINICAL DATA:  Status post fall, with superior right shoulder pain and right lateral chest pain. Initial encounter. EXAM: CHEST  2 VIEW COMPARISON:  Chest radiograph performed 12/25/2011 FINDINGS: The lungs are well-aerated. Pulmonary vascularity is at the upper limits of normal. There is no evidence of focal opacification, pleural effusion or pneumothorax. The heart is normal in size; the mediastinal contour is within normal limits. No acute osseous  abnormalities are seen. The right glenohumeral joint is grossly unremarkable in appearance. IMPRESSION: No acute cardiopulmonary process seen. Electronically Signed   By: Garald Balding M.D.   On: 03/31/2016 03:03   Dg Shoulder Right  Result Date: 03/31/2016 CLINICAL DATA:  Acute onset of right shoulder pain. Initial encounter. EXAM: RIGHT SHOULDER - 2+ VIEW COMPARISON:  None. FINDINGS: There is no evidence of fracture or dislocation. The right humeral head is seated within the  glenoid fossa. The acromioclavicular joint is unremarkable in appearance. No significant soft tissue abnormalities are seen. The visualized portions of the right lung are clear. IMPRESSION: No evidence of fracture or dislocation. Electronically Signed   By: Garald Balding M.D.   On: 03/31/2016 03:06   Ct Head Wo Contrast  Result Date: 03/31/2016 CLINICAL DATA:  55 year old female with dizziness and multiple falls EXAM: CT HEAD WITHOUT CONTRAST TECHNIQUE: Contiguous axial images were obtained from the base of the skull through the vertex without intravenous contrast. COMPARISON:  Head CT dated 01/29/2011 and MRI dated 10/18/2013 FINDINGS: Brain: The ventricles and sulci are appropriate in size for patient's age. Minimal periventricular and deep white matter chronic microvascular ischemic changes noted. There is no acute intracranial hemorrhage. No mass effect or midline shift noted. No extra-axial fluid collection. Vascular: No hyperdense vessel or unexpected calcification. Skull: Normal. Negative for fracture or focal lesion. Sinuses/Orbits: No acute finding. Other: None IMPRESSION: No acute intracranial hemorrhage. Minimal age-related atrophy and chronic microvascular ischemic disease. If symptoms persist and there are no contraindications, MRI may provide better evaluation if clinically indicated Electronically Signed   By: Anner Crete M.D.   On: 03/31/2016 02:41   Dg Hip Unilat W Or Wo Pelvis 2-3 Views Right  Result Date:  03/31/2016 CLINICAL DATA:  Acute onset of pelvic pain.  Initial encounter. EXAM: DG HIP (WITH OR WITHOUT PELVIS) 2-3V RIGHT COMPARISON:  None. FINDINGS: There is no evidence of fracture or dislocation. Both femoral heads are seated normally within their respective acetabula. The proximal right femur appears intact. No significant degenerative change is appreciated. The sacroiliac joints are unremarkable in appearance. The visualized bowel gas pattern is grossly unremarkable in appearance. IMPRESSION: No evidence of fracture or dislocation. Electronically Signed   By: Garald Balding M.D.   On: 03/31/2016 03:05    Procedures Procedures (including critical care time)  Medications Ordered in ED Medications - No data to display   Initial Impression / Assessment and Plan / ED Course  I have reviewed the triage vital signs and the nursing notes.  Pertinent labs & imaging results that were available during my care of the patient were reviewed by me and considered in my medical decision making (see chart for details).  Clinical Course    Pt monitored in the ED She initially came in for falls.  On further questioning, she admits to episodes of difficulty walking in the past.  She has h/o chronic pain/fibromyalgia and is on multiple sedating medications that could be contributing to falls.  No focal weakness in the ED.  She can ambulate with slow gait.  She reports she has had episodes similar to today in the past.  I don't feel this represents acute stroke No signs of any acute injury from falls As far as her home situation, she receives in home nursing assistance once/week and has sister to help her which she feels is adequate and she feels safe at home Plan to check urinalysis and once this is negative she can be discharged Signed out to dr Rogene [place] with urinalysis pending    Final Clinical Impressions(s) / ED Diagnoses   Final diagnoses:  None    New Prescriptions New Prescriptions   No  medications on file   I personally performed the services described in this documentation, which was scribed in my presence. The recorded information has been reviewed and is accurate.        Ripley Fraise, MD 03/31/16 [id number]

## 2016-03-31 NOTE — ED Notes (Signed)
Patient transported to CT 

## 2016-03-31 NOTE — ED Notes (Signed)
MD at bedside. 

## 2016-03-31 NOTE — Discharge Planning (Signed)
[facility] reviewed discharging chart for possible CM needs.  No needs identified.   Follow-up appointment made?n/a  Does patient have transportation Issues? (When/Time?)n/a  Medication needs?n/a  Equipment Needs?n/a                                      Oxygen Needs?n/a  Do lines/drains need to be removed? If yes, then d/c at appropriate time?n/a  PT equipment ordered?n/a  H.H needed? n/a                                                H.H ordered?n/a

## 2016-03-31 NOTE — ED Notes (Signed)
Patient able to void without need for in and out catheter

## 2016-04-01 LAB — URINE CULTURE

## 2016-04-15 DIAGNOSIS — M5137 Other intervertebral disc degeneration, lumbosacral region: Secondary | ICD-10-CM | POA: Diagnosis not present

## 2016-04-15 DIAGNOSIS — M5416 Radiculopathy, lumbar region: Secondary | ICD-10-CM | POA: Diagnosis not present

## 2016-04-15 DIAGNOSIS — M797 Fibromyalgia: Secondary | ICD-10-CM | POA: Diagnosis not present

## 2016-04-15 DIAGNOSIS — M4727 Other spondylosis with radiculopathy, lumbosacral region: Secondary | ICD-10-CM | POA: Diagnosis not present

## 2016-06-04 DIAGNOSIS — M4727 Other spondylosis with radiculopathy, lumbosacral region: Secondary | ICD-10-CM | POA: Diagnosis not present

## 2016-06-04 DIAGNOSIS — M5416 Radiculopathy, lumbar region: Secondary | ICD-10-CM | POA: Diagnosis not present

## 2016-06-04 DIAGNOSIS — M5137 Other intervertebral disc degeneration, lumbosacral region: Secondary | ICD-10-CM | POA: Diagnosis not present

## 2016-06-04 DIAGNOSIS — M797 Fibromyalgia: Secondary | ICD-10-CM | POA: Diagnosis not present

## 2016-06-10 DIAGNOSIS — F250 Schizoaffective disorder, bipolar type: Secondary | ICD-10-CM | POA: Diagnosis not present

## 2016-08-10 DIAGNOSIS — M5416 Radiculopathy, lumbar region: Secondary | ICD-10-CM | POA: Diagnosis not present

## 2016-08-20 DIAGNOSIS — Z5181 Encounter for therapeutic drug level monitoring: Secondary | ICD-10-CM | POA: Diagnosis not present

## 2016-08-20 DIAGNOSIS — M4727 Other spondylosis with radiculopathy, lumbosacral region: Secondary | ICD-10-CM | POA: Diagnosis not present

## 2016-08-20 DIAGNOSIS — M5137 Other intervertebral disc degeneration, lumbosacral region: Secondary | ICD-10-CM | POA: Diagnosis not present

## 2016-08-20 DIAGNOSIS — M5416 Radiculopathy, lumbar region: Secondary | ICD-10-CM | POA: Diagnosis not present

## 2016-08-20 DIAGNOSIS — Z79891 Long term (current) use of opiate analgesic: Secondary | ICD-10-CM | POA: Diagnosis not present

## 2016-08-20 DIAGNOSIS — G894 Chronic pain syndrome: Secondary | ICD-10-CM | POA: Diagnosis not present

## 2016-08-20 DIAGNOSIS — M797 Fibromyalgia: Secondary | ICD-10-CM | POA: Diagnosis not present

## 2016-09-10 ENCOUNTER — Encounter (HOSPITAL_COMMUNITY): Payer: Self-pay | Admitting: Emergency Medicine

## 2016-09-10 DIAGNOSIS — G35 Multiple sclerosis: Principal | ICD-10-CM | POA: Diagnosis present

## 2016-09-10 DIAGNOSIS — G4733 Obstructive sleep apnea (adult) (pediatric): Secondary | ICD-10-CM | POA: Diagnosis present

## 2016-09-10 DIAGNOSIS — Z888 Allergy status to other drugs, medicaments and biological substances status: Secondary | ICD-10-CM

## 2016-09-10 DIAGNOSIS — G8929 Other chronic pain: Secondary | ICD-10-CM | POA: Diagnosis present

## 2016-09-10 DIAGNOSIS — I10 Essential (primary) hypertension: Secondary | ICD-10-CM | POA: Diagnosis not present

## 2016-09-10 DIAGNOSIS — G40909 Epilepsy, unspecified, not intractable, without status epilepticus: Secondary | ICD-10-CM | POA: Diagnosis present

## 2016-09-10 DIAGNOSIS — Z91040 Latex allergy status: Secondary | ICD-10-CM | POA: Diagnosis not present

## 2016-09-10 DIAGNOSIS — R1032 Left lower quadrant pain: Secondary | ICD-10-CM | POA: Diagnosis not present

## 2016-09-10 DIAGNOSIS — K219 Gastro-esophageal reflux disease without esophagitis: Secondary | ICD-10-CM | POA: Diagnosis not present

## 2016-09-10 DIAGNOSIS — Z91013 Allergy to seafood: Secondary | ICD-10-CM | POA: Diagnosis not present

## 2016-09-10 DIAGNOSIS — M549 Dorsalgia, unspecified: Secondary | ICD-10-CM | POA: Diagnosis not present

## 2016-09-10 DIAGNOSIS — R531 Weakness: Secondary | ICD-10-CM | POA: Diagnosis present

## 2016-09-10 DIAGNOSIS — Z79899 Other long term (current) drug therapy: Secondary | ICD-10-CM

## 2016-09-10 DIAGNOSIS — F411 Generalized anxiety disorder: Secondary | ICD-10-CM | POA: Diagnosis not present

## 2016-09-10 DIAGNOSIS — Z91030 Bee allergy status: Secondary | ICD-10-CM

## 2016-09-10 DIAGNOSIS — F333 Major depressive disorder, recurrent, severe with psychotic symptoms: Secondary | ICD-10-CM | POA: Diagnosis present

## 2016-09-10 DIAGNOSIS — M797 Fibromyalgia: Secondary | ICD-10-CM | POA: Diagnosis not present

## 2016-09-10 DIAGNOSIS — Z91010 Allergy to peanuts: Secondary | ICD-10-CM

## 2016-09-10 DIAGNOSIS — R253 Fasciculation: Secondary | ICD-10-CM | POA: Diagnosis present

## 2016-09-10 DIAGNOSIS — R109 Unspecified abdominal pain: Secondary | ICD-10-CM | POA: Diagnosis present

## 2016-09-10 DIAGNOSIS — Z87891 Personal history of nicotine dependence: Secondary | ICD-10-CM | POA: Diagnosis not present

## 2016-09-10 DIAGNOSIS — Z880 Allergy status to penicillin: Secondary | ICD-10-CM | POA: Diagnosis not present

## 2016-09-10 DIAGNOSIS — F250 Schizoaffective disorder, bipolar type: Secondary | ICD-10-CM | POA: Diagnosis not present

## 2016-09-10 LAB — LIPASE, BLOOD: Lipase: 20 U/L (ref 11–51)

## 2016-09-10 LAB — COMPREHENSIVE METABOLIC PANEL
ALT: 46 U/L (ref 14–54)
AST: 27 U/L (ref 15–41)
Albumin: 3.4 g/dL — ABNORMAL LOW (ref 3.5–5.0)
Alkaline Phosphatase: 58 U/L (ref 38–126)
Anion gap: 7 (ref 5–15)
BUN: 14 mg/dL (ref 6–20)
CO2: 25 mmol/L (ref 22–32)
Calcium: 8.8 mg/dL — ABNORMAL LOW (ref 8.9–10.3)
Chloride: 104 mmol/L (ref 101–111)
Creatinine, Ser: 1.02 mg/dL — ABNORMAL HIGH (ref 0.44–1.00)
GFR calc Af Amer: >60 mL/min (ref >60)
GFR calc non Af Amer: >60 mL/min (ref >60)
Glucose, Bld: 99 mg/dL (ref 65–99)
Potassium: 3.7 mmol/L (ref 3.5–5.1)
Sodium: 136 mmol/L (ref 135–145)
Total Bilirubin: 0.4 mg/dL (ref 0.3–1.2)
Total Protein: 7.8 g/dL (ref 6.5–8.1)

## 2016-09-10 LAB — CBC
HCT: 38.4 % (ref 36.0–46.0)
Hemoglobin: 12.8 g/dL (ref 12.0–15.0)
MCH: 27.8 pg (ref 26.0–34.0)
MCHC: 33.3 g/dL (ref 30.0–36.0)
MCV: 83.3 fL (ref 78.0–100.0)
Platelets: 328 10*3/uL (ref 150–400)
RBC: 4.61 MIL/uL (ref 3.87–5.11)
RDW: 16 % — ABNORMAL HIGH (ref 11.5–15.5)
WBC: 7 10*3/uL (ref 4.0–10.5)

## 2016-09-10 NOTE — ED Triage Notes (Signed)
Pt to ED from home c/o LLQ abdominal pain with nausea since this morning. Denies urinary symptoms/fevers/diarrhea. Pt states pain is sharp and radiates down into pelvic area.

## 2016-09-11 ENCOUNTER — Observation Stay (HOSPITAL_COMMUNITY): Payer: Medicare Other

## 2016-09-11 ENCOUNTER — Emergency Department (HOSPITAL_COMMUNITY): Payer: Medicare Other

## 2016-09-11 ENCOUNTER — Inpatient Hospital Stay (HOSPITAL_COMMUNITY)
Admission: EM | Admit: 2016-09-11 | Discharge: 2016-09-12 | DRG: 059 | Disposition: A | Discharge status: Home Health | Payer: Medicare Other | Attending: Internal Medicine | Admitting: Internal Medicine

## 2016-09-11 DIAGNOSIS — F411 Generalized anxiety disorder: Secondary | ICD-10-CM

## 2016-09-11 DIAGNOSIS — M50323 Other cervical disc degeneration at C6-C7 level: Secondary | ICD-10-CM | POA: Diagnosis not present

## 2016-09-11 DIAGNOSIS — R109 Unspecified abdominal pain: Secondary | ICD-10-CM

## 2016-09-11 DIAGNOSIS — K219 Gastro-esophageal reflux disease without esophagitis: Secondary | ICD-10-CM | POA: Diagnosis present

## 2016-09-11 DIAGNOSIS — I10 Essential (primary) hypertension: Secondary | ICD-10-CM | POA: Diagnosis present

## 2016-09-11 DIAGNOSIS — R079 Chest pain, unspecified: Secondary | ICD-10-CM | POA: Diagnosis not present

## 2016-09-11 DIAGNOSIS — R1013 Epigastric pain: Secondary | ICD-10-CM | POA: Diagnosis not present

## 2016-09-11 DIAGNOSIS — Z91040 Latex allergy status: Secondary | ICD-10-CM | POA: Diagnosis not present

## 2016-09-11 DIAGNOSIS — R531 Weakness: Secondary | ICD-10-CM

## 2016-09-11 DIAGNOSIS — G4733 Obstructive sleep apnea (adult) (pediatric): Secondary | ICD-10-CM | POA: Diagnosis present

## 2016-09-11 DIAGNOSIS — Z880 Allergy status to penicillin: Secondary | ICD-10-CM | POA: Diagnosis not present

## 2016-09-11 DIAGNOSIS — M5126 Other intervertebral disc displacement, lumbar region: Secondary | ICD-10-CM | POA: Diagnosis not present

## 2016-09-11 DIAGNOSIS — F333 Major depressive disorder, recurrent, severe with psychotic symptoms: Secondary | ICD-10-CM | POA: Diagnosis present

## 2016-09-11 DIAGNOSIS — G8929 Other chronic pain: Secondary | ICD-10-CM | POA: Diagnosis present

## 2016-09-11 DIAGNOSIS — Z91010 Allergy to peanuts: Secondary | ICD-10-CM | POA: Diagnosis not present

## 2016-09-11 DIAGNOSIS — R1010 Upper abdominal pain, unspecified: Secondary | ICD-10-CM

## 2016-09-11 DIAGNOSIS — Z91030 Bee allergy status: Secondary | ICD-10-CM | POA: Diagnosis not present

## 2016-09-11 DIAGNOSIS — R1032 Left lower quadrant pain: Secondary | ICD-10-CM | POA: Diagnosis not present

## 2016-09-11 DIAGNOSIS — G35 Multiple sclerosis: Secondary | ICD-10-CM

## 2016-09-11 DIAGNOSIS — Z888 Allergy status to other drugs, medicaments and biological substances status: Secondary | ICD-10-CM | POA: Diagnosis not present

## 2016-09-11 DIAGNOSIS — Z87891 Personal history of nicotine dependence: Secondary | ICD-10-CM | POA: Diagnosis not present

## 2016-09-11 DIAGNOSIS — G40909 Epilepsy, unspecified, not intractable, without status epilepticus: Secondary | ICD-10-CM | POA: Diagnosis present

## 2016-09-11 DIAGNOSIS — M797 Fibromyalgia: Secondary | ICD-10-CM | POA: Diagnosis present

## 2016-09-11 DIAGNOSIS — M50322 Other cervical disc degeneration at C5-C6 level: Secondary | ICD-10-CM | POA: Diagnosis not present

## 2016-09-11 DIAGNOSIS — M549 Dorsalgia, unspecified: Secondary | ICD-10-CM | POA: Diagnosis present

## 2016-09-11 DIAGNOSIS — Z79899 Other long term (current) drug therapy: Secondary | ICD-10-CM | POA: Diagnosis not present

## 2016-09-11 DIAGNOSIS — R253 Fasciculation: Secondary | ICD-10-CM | POA: Diagnosis present

## 2016-09-11 DIAGNOSIS — Z91013 Allergy to seafood: Secondary | ICD-10-CM | POA: Diagnosis not present

## 2016-09-11 HISTORY — DX: Weakness: R53.1

## 2016-09-11 LAB — CBC
HCT: 37.8 % (ref 36.0–46.0)
Hemoglobin: 12.4 g/dL (ref 12.0–15.0)
MCH: 27.3 pg (ref 26.0–34.0)
MCHC: 32.8 g/dL (ref 30.0–36.0)
MCV: 83.3 fL (ref 78.0–100.0)
Platelets: 302 10*3/uL (ref 150–400)
RBC: 4.54 MIL/uL (ref 3.87–5.11)
RDW: 16 % — ABNORMAL HIGH (ref 11.5–15.5)
WBC: 4.9 10*3/uL (ref 4.0–10.5)

## 2016-09-11 LAB — URINALYSIS, ROUTINE W REFLEX MICROSCOPIC
Bacteria, UA: NONE SEEN
Bilirubin Urine: NEGATIVE
Glucose, UA: NEGATIVE mg/dL
Ketones, ur: NEGATIVE mg/dL
Nitrite: NEGATIVE
Protein, ur: NEGATIVE mg/dL
Specific Gravity, Urine: 1.009 (ref 1.005–1.030)
pH: 6 (ref 5.0–8.0)

## 2016-09-11 LAB — CREATININE, SERUM
Creatinine, Ser: 0.85 mg/dL (ref 0.44–1.00)
GFR calc Af Amer: >60 mL/min (ref >60)
GFR calc non Af Amer: >60 mL/min (ref >60)

## 2016-09-11 LAB — HIV ANTIBODY (ROUTINE TESTING W REFLEX): HIV Screen 4th Generation wRfx: NONREACTIVE

## 2016-09-11 LAB — TSH: TSH: 0.493 u[IU]/mL (ref 0.350–4.500)

## 2016-09-11 LAB — CORTISOL: Cortisol, Plasma: 11.1 ug/dL

## 2016-09-11 MED ORDER — FAMOTIDINE 20 MG PO TABS
20.0000 mg | ORAL_TABLET | Freq: Two times a day (BID) | ORAL | Status: DC
  Administered 2016-09-11 – 2016-09-12 (×3): 20 mg via ORAL
  Filled 2016-09-11 (×3): qty 1

## 2016-09-11 MED ORDER — MOMETASONE FURO-FORMOTEROL FUM 200-5 MCG/ACT IN AERO
2.0000 | INHALATION_SPRAY | Freq: Two times a day (BID) | RESPIRATORY_TRACT | Status: DC
  Filled 2016-09-11: qty 8.8

## 2016-09-11 MED ORDER — ALPRAZOLAM 0.5 MG PO TABS
0.5000 mg | ORAL_TABLET | Freq: Three times a day (TID) | ORAL | Status: DC | PRN
  Administered 2016-09-11 – 2016-09-12 (×3): 0.5 mg via ORAL
  Filled 2016-09-11 (×2): qty 1
  Filled 2016-09-11: qty 2

## 2016-09-11 MED ORDER — ACETAMINOPHEN 325 MG PO TABS
650.0000 mg | ORAL_TABLET | Freq: Four times a day (QID) | ORAL | Status: DC | PRN
  Administered 2016-09-11: 650 mg via ORAL
  Filled 2016-09-11 (×2): qty 2

## 2016-09-11 MED ORDER — ACETAMINOPHEN 650 MG RE SUPP: 650.0000 mg | Freq: Four times a day (QID) | RECTAL | Status: DC | PRN

## 2016-09-11 MED ORDER — ALBUTEROL SULFATE (2.5 MG/3ML) 0.083% IN NEBU
2.5000 mg | INHALATION_SOLUTION | Freq: Two times a day (BID) | RESPIRATORY_TRACT | Status: DC
  Administered 2016-09-12: 2.5 mg via RESPIRATORY_TRACT
  Filled 2016-09-11: qty 3

## 2016-09-11 MED ORDER — ONDANSETRON HCL 4 MG/2ML IJ SOLN: 4.0000 mg | Freq: Four times a day (QID) | INTRAMUSCULAR | Status: DC | PRN

## 2016-09-11 MED ORDER — MORPHINE SULFATE (PF) 4 MG/ML IV SOLN
4.0000 mg | Freq: Once | INTRAVENOUS | Status: AC
  Administered 2016-09-11: 4 mg via INTRAVENOUS
  Filled 2016-09-11: qty 1

## 2016-09-11 MED ORDER — HEPARIN SODIUM (PORCINE) 5000 UNIT/ML IJ SOLN
5000.0000 [IU] | Freq: Three times a day (TID) | INTRAMUSCULAR | Status: DC
  Administered 2016-09-11 – 2016-09-12 (×5): 5000 [IU] via SUBCUTANEOUS
  Filled 2016-09-11 (×5): qty 1

## 2016-09-11 MED ORDER — KETOROLAC TROMETHAMINE 30 MG/ML IJ SOLN
30.0000 mg | Freq: Once | INTRAMUSCULAR | Status: AC
  Administered 2016-09-11: 30 mg via INTRAVENOUS
  Filled 2016-09-11: qty 1

## 2016-09-11 MED ORDER — METOPROLOL SUCCINATE ER 50 MG PO TB24
50.0000 mg | ORAL_TABLET | Freq: Every day | ORAL | Status: DC
  Administered 2016-09-11: 50 mg via ORAL
  Filled 2016-09-11: qty 1

## 2016-09-11 MED ORDER — POLYETHYLENE GLYCOL 3350 17 G PO PACK: 17.0000 g | PACK | Freq: Every day | ORAL | Status: DC | PRN

## 2016-09-11 MED ORDER — ONDANSETRON HCL 4 MG PO TABS: 4.0000 mg | ORAL_TABLET | Freq: Four times a day (QID) | ORAL | Status: DC | PRN

## 2016-09-11 MED ORDER — HYDROCODONE-ACETAMINOPHEN 10-325 MG PO TABS
1.0000 | ORAL_TABLET | Freq: Three times a day (TID) | ORAL | Status: DC | PRN
  Administered 2016-09-11 – 2016-09-12 (×3): 1 via ORAL
  Filled 2016-09-11 (×3): qty 1

## 2016-09-11 MED ORDER — LORAZEPAM 2 MG/ML IJ SOLN
1.0000 mg | Freq: Once | INTRAMUSCULAR | Status: AC
  Administered 2016-09-11: 1 mg via INTRAVENOUS
  Filled 2016-09-11: qty 1

## 2016-09-11 MED ORDER — TRIFLUOPERAZINE HCL 2 MG PO TABS
2.0000 mg | ORAL_TABLET | Freq: Every day | ORAL | Status: DC
  Administered 2016-09-11: 2 mg via ORAL
  Filled 2016-09-11: qty 1

## 2016-09-11 MED ORDER — SODIUM CHLORIDE 0.9% FLUSH
3.0000 mL | Freq: Two times a day (BID) | INTRAVENOUS | Status: DC
  Administered 2016-09-11 (×2): 3 mL via INTRAVENOUS

## 2016-09-11 MED ORDER — ALBUTEROL SULFATE (2.5 MG/3ML) 0.083% IN NEBU
2.5000 mg | INHALATION_SOLUTION | RESPIRATORY_TRACT | Status: DC
  Administered 2016-09-11 (×3): 2.5 mg via RESPIRATORY_TRACT
  Filled 2016-09-11 (×3): qty 3

## 2016-09-11 MED ORDER — LAMOTRIGINE 25 MG PO TABS
75.0000 mg | ORAL_TABLET | Freq: Two times a day (BID) | ORAL | Status: DC
  Administered 2016-09-11 – 2016-09-12 (×3): 75 mg via ORAL
  Filled 2016-09-11 (×3): qty 3

## 2016-09-11 MED ORDER — BARIUM SULFATE 2.1 % PO SUSP
ORAL | Status: AC
  Filled 2016-09-11: qty 2

## 2016-09-11 MED ORDER — LEVETIRACETAM 500 MG PO TABS
500.0000 mg | ORAL_TABLET | Freq: Two times a day (BID) | ORAL | Status: DC
  Administered 2016-09-11 – 2016-09-12 (×2): 500 mg via ORAL
  Filled 2016-09-11 (×4): qty 1

## 2016-09-11 MED ORDER — FLUOXETINE HCL 20 MG PO CAPS
20.0000 mg | ORAL_CAPSULE | Freq: Every day | ORAL | Status: DC
  Administered 2016-09-11 – 2016-09-12 (×2): 20 mg via ORAL
  Filled 2016-09-11 (×2): qty 1

## 2016-09-11 MED ORDER — SODIUM CHLORIDE 0.9 % IV BOLUS (SEPSIS)
1000.0000 mL | Freq: Once | INTRAVENOUS | Status: AC
  Administered 2016-09-11: 1000 mL via INTRAVENOUS

## 2016-09-11 MED ORDER — TRAZODONE HCL 50 MG PO TABS
150.0000 mg | ORAL_TABLET | Freq: Every day | ORAL | Status: DC
  Administered 2016-09-11: 150 mg via ORAL
  Filled 2016-09-11: qty 1

## 2016-09-11 MED ORDER — ADULT MULTIVITAMIN W/MINERALS CH
1.0000 | ORAL_TABLET | Freq: Every day | ORAL | Status: DC
  Administered 2016-09-11 – 2016-09-12 (×2): 1 via ORAL
  Filled 2016-09-11 (×2): qty 1

## 2016-09-11 NOTE — ED Notes (Signed)
Patient transported to CT 

## 2016-09-11 NOTE — H&P (Signed)
History and Physical    [name] TDV:[id number] DOB: 10/16/1960 DOA: 09/11/2016  PCP: Ricke Hey, MD Patient coming from: Home  Chief Complaint: Abdominal pain HPI: [name] is a 56 y.o. female with medical history significant generalized anxiety, major depression with psychotic features, MS, chronic back pain with h/o three lumbar epidural injections on  Vicodin at home, Seizure disorder, Fibromyalgia, HTN, GERD who presented to the emergency department with complaints of lower abdominal pain. The pain was 7 AM the day of her presentation to the ED and associated with nausea but no vomiting. Patient denied dysuria or hematuria, but reported having  Been told by her GYN that she needs to have it removed, but surgery has not been scheduled yet.  ED Course: On arrival to the ED patient had stable vital signs, blood work was unremarkable, chest x-ray didn't reveal acute cardiopulmonary process, abdominal CT didn't show any acute abnormality In the ED the patient was complaining about upper extremities weakness and being very unsteady on her feet. She failed an attempt to ambulate d/t profound weakness. The suspicion was that patient might have MS exacerbation and neurology consult was requested by ED provider  Review of Systems: As per HPI otherwise 10 point review of systems negative. Independent  Ambulatory Status: Independent  Past Medical History:  Diagnosis Date  . Anemia   . Anxiety state, unspecified   . Arthritis   . Arthropathy, unspecified, site unspecified   . Asthma   . Backache, unspecified   . Bipolar disorder ([place])   . Depressive disorder, not elsewhere classified   . Esophageal reflux   . Fibromyalgia   . Hypertension   . MS (multiple sclerosis) ([place])    remission at present(past hx. bedridden) -ambulates with cane-left side weaker  . Obstructive sleep apnea (adult) (pediatric)    no cpap-uses Oxygen nightly 2l/m   . On home oxygen therapy 05-19-13     nightly at 2l/m  . Other abnormal glucose    05-19-13 no problems now  . Seizures ([place])    last seizure 2014    Past Surgical History:  Procedure Laterality Date  . APPENDECTOMY    . BREAST LUMPECTOMY WITH NEEDLE LOCALIZATION Left 06/06/2015   Procedure: LEFT BREAST LUMPECTOMY WITH NEEDLE LOCALIZATION TIMES TWO;  Surgeon: Erroll Luna, MD;  Location: [place];  Service: General;  Laterality: Left;  . BREAST SURGERY    . CESAREAN SECTION  1988   twins  . CHOLECYSTECTOMY    . COLONOSCOPY N/A 06/12/2013   Procedure: COLONOSCOPY;  Surgeon: Irene Shipper, MD;  Location: [facility];  Service: Endoscopy;  Laterality: N/A;  . HERNIA REPAIR     umbilical  . TUBAL LIGATION    . VAGINAL HYSTERECTOMY      Social History   Social History  . Marital status: Divorced    Spouse name: N/A  . Number of children: 3  . Years of education: 12+   Occupational History  . Disabled    Social History Main Topics  . Smoking status: Former Smoker    Packs/day: 1.00    Years: 5.00    Quit date: 07/07/2007  . Smokeless tobacco: Never Used  . Alcohol use No  . Drug use: No  . Sexual activity: Not Currently    Birth control/ protection: Surgical     Comment: X 1 year   Other Topics Concern  . Not on file   Social History Narrative   Single, 3 children  Right handed   Associates degree   occ 1 cup daily    Allergies  Allergen Reactions  . Bee Venom Anaphylaxis  . Depakote [Divalproex Sodium] Shortness Of Breath and Rash  . Latex Anaphylaxis  . Peanut-Containing Drug Products Anaphylaxis  . Penicillins Anaphylaxis  . Seroquel [Quetiapine] Anaphylaxis  . Shellfish-Derived Products Anaphylaxis  . Amitriptyline Hcl Other (See Comments)    REACTION: HALLUCINATIONS  . Butorphanol Tartrate Other (See Comments)    REACTION: Hallucinations  . Gabapentin Other (See Comments)    Pt reports severe back and side pain  . Iodine     REACTION: Flushing and fainting     Family History  Problem Relation Age of Onset  . Heart disease Father   . Ovarian cancer Mother   . Colon cancer Neg Hx   . Esophageal cancer Neg Hx   . Stomach cancer Neg Hx   . Rectal cancer Neg Hx     Prior to Admission medications   Medication Sig Start Date End Date Taking? Authorizing Provider  ADVAIR DISKUS 500-50 MCG/DOSE AEPB USE ONE INHALATION BY MOUTH EVERY 12 HOURS FOR ASTHMA   Yes Deneise Lever, MD  ALPRAZolam Duanne Moron) 0.5 MG tablet Take 1 tablet by mouth 3 (three) times daily as needed for anxiety or sleep.  10/02/12  Yes Historical Provider, MD  EPINEPHrine (EPI-PEN) 0.3 mg/0.3 mL DEVI Inject 0.3 mg into the muscle once. As needed for severe allergic reaction 04/07/12  Yes Milana Huntsman Readling, MD  famotidine (PEPCID) 20 MG tablet Take 1 tablet (20 mg total) by mouth 2 (two) times daily. For stomach acid. 04/07/12  Yes Milana Huntsman Readling, MD  FLUoxetine (PROZAC) 20 MG capsule Take 20 mg by mouth daily.   Yes Historical Provider, MD  HYDROcodone-acetaminophen (NORCO) 10-325 MG tablet Take 1 tablet by mouth 3 (three) times daily as needed for moderate pain.   Yes Historical Provider, MD  lamoTRIgine (LAMICTAL) 25 MG tablet Take 75 mg by mouth 2 (two) times daily.   Yes Historical Provider, MD  metoprolol succinate (TOPROL-XL) 50 MG 24 hr tablet Take 50 mg by mouth at bedtime. Take with or immediately following a meal for blood pressure control. 04/07/12  Yes Mellissa Kohut, MD  Multiple Vitamin (MULTIVITAMIN WITH MINERALS) TABS Take 1 tablet by mouth daily. For nutritional supplementation. 04/07/12  Yes Milana Huntsman Readling, MD  PROAIR HFA 108 (90 BASE) MCG/ACT inhaler INHALE 2 PUFFS BY MOUTH EVERY 4 HOURS AS NEEDED FOR WHEEZING OR SHORTNESS OF BREATH   Yes Deneise Lever, MD  traZODone (DESYREL) 150 MG tablet Take 150 mg by mouth at bedtime.   Yes Historical Provider, MD  trifluoperazine (STELAZINE) 2 MG tablet Take 2 mg by mouth at bedtime.   Yes Historical Provider, MD     Physical Exam: Vitals:   09/11/16 0545 09/11/16 0600 09/11/16 0615 09/11/16 0700  BP: 132/91 130/81 128/85 132/89  Pulse: 73 72 69 76  Resp: 18 20 19 17   Temp:      TempSrc:      SpO2: 97% 96% 96% 97%  Weight:      Height:         General: Appears calm and comfortable Eyes: PERRLA, EOMI, normal lids, iris ENT:  grossly normal hearing, lips & tongue, mucous membranes moist and intact Neck: no lymphoadenopathy, masses or thyromegaly Cardiovascular: RRR, no m/r/g. No JVD, carotid bruits. No LE edema.  Respiratory: bilateral no wheezes, rales, rhonchi or cracles. Normal respiratory effort. No accessory  muscle use observed Abdomen: soft, tender On palpation in the left lower quadrant, non-distended, no organomegaly or masses appreciated. BS present in all quadrants Skin: no rash, ulcers or induration seen on limited exam Musculoskeletal: grossly normal tone BUE/BLE, good ROM, no bony abnormality or joint deformities observed Psychiatric: grossly normal mood and affect, speech fluent and appropriate, alert and oriented x3 Neurologic: CN II-XII grossly intact, moves all extremities in coordinated fashion, sensation intact  Labs on Admission: I have personally reviewed following labs and imaging studies  CBC, BMP  GFR: Estimated Creatinine Clearance: 81 mL/min (by C-G formula based on SCr of 1.02 mg/dL (H)).   Creatinine Clearance: Estimated Creatinine Clearance: 81 mL/min (by C-G formula based on SCr of 1.02 mg/dL (H)).    Radiological Exams on Admission: Ct Abdomen Pelvis Wo Contrast  Result Date: 09/11/2016 CLINICAL DATA:  Left lower quadrant pain and nausea, onset this morning EXAM: CT ABDOMEN AND PELVIS WITHOUT CONTRAST TECHNIQUE: Multidetector CT imaging of the abdomen and pelvis was performed following the standard protocol without IV contrast. COMPARISON:  12/25/2011 FINDINGS: Lower chest: No acute abnormality. Hepatobiliary: No focal liver abnormality is seen. Status  post cholecystectomy. No biliary dilatation. Pancreas: Unremarkable. No pancreatic ductal dilatation or surrounding inflammatory changes. Spleen: Normal in size without focal abnormality. Adrenals/Urinary Tract: Adrenal glands are unremarkable. Kidneys are normal, without renal calculi, focal lesion, or hydronephrosis. Bladder is unremarkable. Stomach/Bowel: Probable fundoplication. Stomach and small bowel are otherwise unremarkable. Colon is unremarkable. No bowel obstruction. No acute inflammation of bowel. Vascular/Lymphatic: No significant vascular findings are present. No enlarged abdominal or pelvic lymph nodes. Reproductive: Hysterectomy.  No adnexal abnormalities. Other: No acute inflammation in the abdomen or pelvis.  No ascites. Musculoskeletal: No significant skeletal lesion. IMPRESSION: No significant abnormality. Electronically Signed   By: Andreas Newport M.D.   On: 09/11/2016 03:45   Dg Chest 2 View  Result Date: 09/11/2016 CLINICAL DATA:  Nat Christen all over worse on left side,hx ms,htn,asthma EXAM: CHEST - 2 VIEW COMPARISON:  03/31/2016 FINDINGS: Lungs are clear. Heart size and mediastinal contours are within normal limits. No effusion. Visualized bones unremarkable. IMPRESSION: No acute cardiopulmonary disease. Electronically Signed   By: Lucrezia Europe M.D.   On: 09/11/2016 07:54    EKG: Independently reviewed - normal sinus rhythm, no acute ST-T wave changes  Assessment/Plan Principal Problem:   Weakness Active Problems:   OSA (obstructive sleep apnea)   G E R D   Chronic back pain   Abdominal pain   Major depressive disorder, recurrent, severe with psychotic features (HCC)   Generalized anxiety disorder   Seizure disorder (HCC)    Weakness - abrupt onset, unclear if is associated with MS flair or herniation of the disc. Patient has MS and multilevel umbar spine DDD Cervical, thoracic and lumbar spine MRI without contrast ordered as well as neurology consultation Continue  Vicodin for DDD  Abdominal pain  Abdominal CT did not reveal any significant acute findings UA dipstick was positive for small amount of hemoglobin, revealed large amount of WBC's and was nitrate negatived  Etiology of her abdominal pain is unclear and might be associated ovarian abnormality  GERD  Continue H2R blocker home doses  Hypertension - currently stable Continue home medication and adjust the doses if needed depending on the BP readings  Seizure disorder Continue home meds  Generalized anxiety and major derpessive disorder with psychotic features Continue home meds   DVT prophylaxis: heparin Code Status: full Family Communication: none Disposition Plan: Med Surg Consults  called: Neurology Admission status: observation   York Grice, [place] Pager: [phone] [facility]  If 7PM-7AM, please contact night-coverage [email or web address] Password [facility]  09/11/2016, 8:48 AM

## 2016-09-11 NOTE — Consult Note (Signed)
NEURO HOSPITALIST CONSULT NOTE   Requestig physician: Dr. Leonides Schanz  Reason for Consult: Possible MS exacerbation  History obtained from:   Patient and Chart    HPI:                                                                                                                                          [name] is an 56 y.o. female who initially presented with LLQ abdominal pain and nausea. During CT scan, she had difficulty moving her arms due to weakness. At 4:00 AM today, she was noted to be very unsteady on her feet, almost falling when getting out of bed. ED team felt that the weakness might be due to an MS exacerbation. Neurology was consulted to further evaluate.   Chart reviewed. She has been seen in the past by [facility]. Per a prior note from April 2015: "56y/o woman with history of seizures, and questionable MS, presenting for initial evaluation of seizure disorder. Was seen in ED 08/2013 and started on keppra 500mg  BID. States she has had seizures since childhood but had been seizure free for "years" up until December. In December 2014 has had 2 suspected seizures and then an additional event in Feb. The December events coincided with discontinuing Cymbalta, which she had been on for years. Describes most recent event as feeling funny/anxious, felt dizzy, that is the last thing she recalls until EMS arrived. Her sister noted she was shaking, eyes rolled, not responding, very confused and agitated after the event. Shaking lasted around 3 minutes, + tongue biting, no loss of bowel/bladder. Currently taking keppra 500mg  BID, tolerating well. States she was diagnosed with MS in 2000, took beta-seron up until 2005. Main symptoms were visual changes, gait abnormality and difficulty with fine motor tasks. Last MRI was in 2012 and was unremarkable. States MS diagnosed via MRI and LP. States she is currently asymptomatic."   Past Medical History:  Diagnosis  Date  . Anemia   . Anxiety state, unspecified   . Arthritis   . Arthropathy, unspecified, site unspecified   . Asthma   . Backache, unspecified   . Bipolar disorder ([place])   . Depressive disorder, not elsewhere classified   . Esophageal reflux   . Fibromyalgia   . Hypertension   . MS (multiple sclerosis) ([place])    remission at present(past hx. bedridden) -ambulates with cane-left side weaker  . Obstructive sleep apnea (adult) (pediatric)    no cpap-uses Oxygen nightly 2l/m   . On home oxygen therapy 05-19-13   nightly at 2l/m  . Other abnormal glucose    05-19-13 no problems now  . Seizures ([place])    last seizure 2014    Past Surgical History:  Procedure Laterality Date  . APPENDECTOMY    .  BREAST LUMPECTOMY WITH NEEDLE LOCALIZATION Left 06/06/2015   Procedure: LEFT BREAST LUMPECTOMY WITH NEEDLE LOCALIZATION TIMES TWO;  Surgeon: Erroll Luna, MD;  Location: [place];  Service: General;  Laterality: Left;  . BREAST SURGERY    . CESAREAN SECTION  1988   twins  . CHOLECYSTECTOMY    . COLONOSCOPY N/A 06/12/2013   Procedure: COLONOSCOPY;  Surgeon: Irene Shipper, MD;  Location: [facility];  Service: Endoscopy;  Laterality: N/A;  . HERNIA REPAIR     umbilical  . TUBAL LIGATION    . VAGINAL HYSTERECTOMY      Family History  Problem Relation Age of Onset  . Heart disease Father   . Ovarian cancer Mother   . Colon cancer Neg Hx   . Esophageal cancer Neg Hx   . Stomach cancer Neg Hx   . Rectal cancer Neg Hx    Social History:  reports that she quit smoking about 9 years ago. She has a 5.00 pack-year smoking history. She has never used smokeless tobacco. She reports that she does not drink alcohol or use drugs.  Allergies  Allergen Reactions  . Bee Venom Anaphylaxis  . Depakote [Divalproex Sodium] Shortness Of Breath and Rash  . Latex Anaphylaxis  . Peanut-Containing Drug Products Anaphylaxis  . Penicillins Anaphylaxis  . Seroquel [Quetiapine] Anaphylaxis   . Shellfish-Derived Products Anaphylaxis  . Amitriptyline Hcl Other (See Comments)    REACTION: HALLUCINATIONS  . Butorphanol Tartrate Other (See Comments)    REACTION: Hallucinations  . Gabapentin Other (See Comments)    Pt reports severe back and side pain  . Iodine     REACTION: Flushing and fainting    HOME MEDICATIONS:                                                                                                                     ADVAIR DISKUS 500-50 MCG/DOSE AEPB USE ONE INHALATION BY MOUTH EVERY 12 HOURS FOR ASTHMA Deneise Lever, MD Needs Review  ALPRAZolam (XANAX) 0.5 MG tablet Take 1 tablet by mouth 3 (three) times daily as needed for anxiety or sleep.  Historical Provider, MD Needs Review  EPINEPHrine (EPI-PEN) 0.3 mg/0.3 mL DEVI Inject 0.3 mg into the muscle once. As needed for severe allergic reaction Mellissa Kohut, MD Needs Review  famotidine (PEPCID) 20 MG tablet Take 1 tablet (20 mg total) by mouth 2 (two) times daily. For stomach acid. Mellissa Kohut, MD Needs Review  lamoTRIgine (LAMICTAL) 25 MG tablet Take 75 mg by mouth 2 (two) times daily. Historical Provider, MD Needs Review  levETIRAcetam (KEPPRA) 500 MG tablet Take 1 tablet (500 mg total) by mouth 2 (two) times daily. Drema Dallas, DO Needs Review  metoprolol succinate (TOPROL-XL) 50 MG 24 hr tablet Take 50 mg by mouth at bedtime. Take with or immediately following a meal for blood pressure control. Mellissa Kohut, MD Needs Review  Multiple Vitamin (MULTIVITAMIN WITH MINERALS) TABS Take 1 tablet by mouth daily. For nutritional  supplementation. Mellissa Kohut, MD Needs Review  PROAIR HFA 108 (90 BASE) MCG/ACT inhaler INHALE 2 PUFFS BY MOUTH EVERY 4 HOURS AS NEEDED FOR WHEEZING OR SHORTNESS OF BREATH Deneise Lever, MD Needs Review  traZODone (DESYREL) 150 MG tablet Take 150 mg by mouth at bedtime. Historical Provider, MD Needs Review  trifluoperazine (STELAZINE) 2 MG tablet Take 2 mg by mouth at bedtime.  Historical Provider, MD Needs Review  ciprofloxacin (CIPRO) 500 MG tablet Take 1 tablet (500 mg total) by mouth 2 (two) times daily. Fredia Sorrow, MD Needs Review   Patient not taking: Reported on 09/11/2016    ciprofloxacin (CIPRO) 500 MG tablet Take 1 tablet (500 mg total) by mouth 2 (two) times daily. Fredia Sorrow, MD Needs Review   Patient not taking: Reported on 09/11/2016    oxyCODONE-acetaminophen (ROXICET) 5-325 MG tablet Take 1-2 tablets by mouth every 4 (four) hours as needed. Erroll Luna, MD Needs Review   Patient not taking: Reported on 03/31/2016      ROS:                                                                                                                                       History obtained from patient. Positive for chest pain earlier during ER stay. Positive for mild confusion. No dysphagia, dysphasia or vision changes.   Blood pressure 134/79, pulse 70, temperature 97.9 F (36.6 C), temperature source Oral, resp. rate 22, height 5\' 9"  (1.753 m), weight 104.3 kg (230 lb), SpO2 97 %.   General Examination:                                                                                                      HEENT-  Normocephalic/atraumatic.  Lungs- Respirations unlabored Extremities- No edema.   Neurological Examination Mental Status: Alert, fully oriented, thought content appropriate. Odd affect. Hypophonic speech that is intermittently hesitant, without errors of grammar or syntax. Repetition and naming intact. Exhibits laborious and slow response to the command "touch your left ear with your right hand" but follows all other commands normally and can answer complex questions regarding her medical history.  Cranial Nerves: II: Visual fields intact to confrontation testing. PERRL.  III,IV, VI: Widened palpebral fissures given an appearance of "surprise". Saccadic quality to visual pursuits with versions intact horizontally and vertically. Mild esotropia noted.  No nystagmus.  V,VII: smile symmetric, facial temperature sensation normal bilaterally VIII: hearing intact to questions and commands IX,X: Hypophonic speech XI: bilateral shoulder  shrug is symmetric but effortful XII: Midline tongue extension. Fasciculations noted with tongue extended, absent with tongue retracted to normal position with mouth open.  Motor:  Bilateral upper extremities 4+/5 with giveway weakness, intermittent tremulousness and varying effort Bilateral lower extremities 4+/5 with giveway weakness, intermittent tremulousness and varying effort  Increased tone to right lower extremity with passive rapid elevation above bed. Sensory: Subjective absent temperature sensation x 4.  Light touch intact x 4. RLE with extinction to double simultaneous stimulation.  Deep Tendon Reflexes: 3+ bilateral upper extremities and patellae. 2+ bilateral achilles. Toes downgoing.  Cerebellar: No ataxia with FNF in the context of slow, laborious movements with intermittent tremulousness Gait: Deferred  Lab Results: Basic Metabolic Panel:  Recent Labs Lab 09/10/16 2239  NA 136  K 3.7  CL 104  CO2 25  GLUCOSE 99  BUN 14  CREATININE 1.02*  CALCIUM 8.8*    Liver Function Tests:  Recent Labs Lab 09/10/16 2239  AST 27  ALT 46  ALKPHOS 58  BILITOT 0.4  PROT 7.8  ALBUMIN 3.4*    Recent Labs Lab 09/10/16 2239  LIPASE 20   No results for input(s): AMMONIA in the last 168 hours.  CBC:  Recent Labs Lab 09/10/16 2239  WBC 7.0  HGB 12.8  HCT 38.4  MCV 83.3  PLT 328    Cardiac Enzymes: No results for input(s): CKTOTAL, CKMB, CKMBINDEX, TROPONINI in the last 168 hours.  Lipid Panel: No results for input(s): CHOL, TRIG, HDL, CHOLHDL, VLDL, LDLCALC in the last 168 hours.  CBG: No results for input(s): GLUCAP in the last 168 hours.  Microbiology: Results for orders placed or performed during the hospital encounter of 03/31/16  Urine culture     Status: Abnormal    Collection Time: 03/31/16  9:18 AM  Result Value Ref Range Status   Specimen Description URINE, RANDOM  Final   Special Requests ADDED [id number]  Final   Culture MULTIPLE SPECIES PRESENT, SUGGEST RECOLLECTION (A)  Final   Report Status 04/01/2016 FINAL  Final    Coagulation Studies: No results for input(s): LABPROT, INR in the last 72 hours.  Imaging: Ct Abdomen Pelvis Wo Contrast  Result Date: 09/11/2016 CLINICAL DATA:  Left lower quadrant pain and nausea, onset this morning EXAM: CT ABDOMEN AND PELVIS WITHOUT CONTRAST TECHNIQUE: Multidetector CT imaging of the abdomen and pelvis was performed following the standard protocol without IV contrast. COMPARISON:  12/25/2011 FINDINGS: Lower chest: No acute abnormality. Hepatobiliary: No focal liver abnormality is seen. Status post cholecystectomy. No biliary dilatation. Pancreas: Unremarkable. No pancreatic ductal dilatation or surrounding inflammatory changes. Spleen: Normal in size without focal abnormality. Adrenals/Urinary Tract: Adrenal glands are unremarkable. Kidneys are normal, without renal calculi, focal lesion, or hydronephrosis. Bladder is unremarkable. Stomach/Bowel: Probable fundoplication. Stomach and small bowel are otherwise unremarkable. Colon is unremarkable. No bowel obstruction. No acute inflammation of bowel. Vascular/Lymphatic: No significant vascular findings are present. No enlarged abdominal or pelvic lymph nodes. Reproductive: Hysterectomy.  No adnexal abnormalities. Other: No acute inflammation in the abdomen or pelvis.  No ascites. Musculoskeletal: No significant skeletal lesion. IMPRESSION: No significant abnormality. Electronically Signed   By: Andreas Newport M.D.   On: 09/11/2016 03:45    Assessment: 1. Diffuse weakness subjectively, with several findings on exam that are referable to multiple neuroanatomical locations and could be secondary to MS. Prior outpatient Neurology note from [facility]  documents her MS as being an equivocal diagnosis. She has since switched to [facility], states  she is being treated there for MS, but is no longer on disease modifying therapy.  2. Tongue fasciculations could be embellished but can also be seen in inflammatory or degenerative motor neuron disease. Also on the DDx regarding this and #1 is factitious disorder and conversion disorder.   3. Seizure disorder. Last seizure in 2014. On Lamictal and Keppra.  4. Bipolar disorder and anxiety.  5. Fibromyalgia.   Recommendations: 1. MRI of brain and cervical spine with contrast. If positive for acute demyelination call neurology for further recommendations.  2. B12 level.  3. Thiamine level.  4. PT/OT.  Electronically signed: Dr. Kerney Elbe 09/11/2016, 6:00 AM

## 2016-09-11 NOTE — Progress Notes (Signed)
[name] is a 56 y.o. female patient admitted from ED awake, alert - oriented  X 4 - no acute distress noted.  VSS - Blood pressure 114/66, pulse 73, temperature 98.3 F (36.8 C), temperature source Oral, resp. rate 18, height 5\' 9"  (1.753 m), weight 104.4 kg (230 lb 1.6 oz), SpO2 98 %.    IV in place, occlusive dsg intact without redness.  Orientation to room, and floor completed with information packet given to patient/family.  Patient declined safety video at this time.  Admission INP armband ID verified with patient/family, and in place.   SR up x 2, fall assessment complete, with patient and family able to verbalize understanding of risk associated with falls, and verbalized understanding to call nsg before up out of bed.  Call light within reach, patient able to voice, and demonstrate understanding.  Skin, clean-dry- intact without evidence of bruising, or skin tears.   Only skin issue is a scab on left knee, patient reported a previous fall. Bed alarm in place      Will cont to eval and treat per MD orders.  Betha Loa Abbigale Mcelhaney, RN 09/11/2016 4:17 PM

## 2016-09-11 NOTE — ED Provider Notes (Signed)
[facility] DEPT Provider Note   CSN: [id number] Arrival date & time: 09/10/16  2229     History   Chief Complaint Chief Complaint  Patient presents with  . Abdominal Pain    HPI [name] is a 56 y.o. female.  HPI Patient presents to the emergency department with abdominal discomfort that started yesterday.  The patient states that she has had nausea but no vomiting with this.  Patient states that the pain is sharp in nature and seems to radiate up and down the left side of her abdomen.  Patient states she has had some left ovarian issues and states that her GYN doctor felt third ovary.  Needed to be removed but has not scheduled this procedure yet.  The patient states that nothing seems make the condition better, but palpation makes the pain worseThe patient denies chest pain, shortness of breath, headache,blurred vision, neck pain, fever, cough, weakness, numbness, dizziness, anorexia, edema, vomiting, diarrhea, rash, back pain, dysuria, hematemesis, bloody stool, near syncope, or syncope. Past Medical History:  Diagnosis Date  . Anemia   . Anxiety state, unspecified   . Arthritis   . Arthropathy, unspecified, site unspecified   . Asthma   . Backache, unspecified   . Bipolar disorder ([place])   . Depressive disorder, not elsewhere classified   . Esophageal reflux   . Fibromyalgia   . Hypertension   . MS (multiple sclerosis) ([place])    remission at present(past hx. bedridden) -ambulates with cane-left side weaker  . Obstructive sleep apnea (adult) (pediatric)    no cpap-uses Oxygen nightly 2l/m   . On home oxygen therapy 05-19-13   nightly at 2l/m  . Other abnormal glucose    05-19-13 no problems now  . Seizures ([place])    last seizure 2014    Patient Active Problem List   Diagnosis Date Noted  . Weakness 09/11/2016  . Seizure disorder ([place]) 12/30/2013  . Special screening for malignant neoplasms, colon 05/18/2013  . Major depressive disorder, recurrent, severe  with psychotic features ([place]) 04/05/2012  . Generalized anxiety disorder 04/05/2012  . Elevated LFTs 10/15/2010  . ABDOMINAL PAIN, UNSPECIFIED 09/27/2009  . Extrinsic asthma, unspecified 06/04/2009  . VOCAL CORD DISORDER 04/02/2009  . PERSONAL HISTORY OF ALLERGY TO SEAFOOD 10/10/2008  . HEADACHE 10/07/2008  . RHINOCONJUNCTIVITIS, ALLERGIC 09/20/2008  . ALLERGY TO INSECTS AND ARACHNIDS 09/20/2008  . DYSPHAGIA PHARYNGOESOPHAGEAL PHASE 09/12/2008  . G E R D 07/05/2008  . ANXIETY 06/13/2008  . DEPRESSION 06/13/2008  . OBSTRUCTIVE SLEEP APNEA 06/13/2008  . ARTHRITIS 06/13/2008  . BACK PAIN, CHRONIC 06/13/2008  . HYPERGLYCEMIA 06/13/2008    Past Surgical History:  Procedure Laterality Date  . APPENDECTOMY    . BREAST LUMPECTOMY WITH NEEDLE LOCALIZATION Left 06/06/2015   Procedure: LEFT BREAST LUMPECTOMY WITH NEEDLE LOCALIZATION TIMES TWO;  Surgeon: Erroll Luna, MD;  Location: [facility];  Service: General;  Laterality: Left;  . BREAST SURGERY    . CESAREAN SECTION  1988   twins  . CHOLECYSTECTOMY    . COLONOSCOPY N/A 06/12/2013   Procedure: COLONOSCOPY;  Surgeon: Irene Shipper, MD;  Location: [facility];  Service: Endoscopy;  Laterality: N/A;  . HERNIA REPAIR     umbilical  . TUBAL LIGATION    . VAGINAL HYSTERECTOMY      OB History    Gravida Para Term Preterm AB Living   2 2 1 1   3    SAB TAB Ectopic Multiple Live Births  1         Home Medications    Prior to Admission medications   Medication Sig Start Date End Date Taking? Authorizing Provider  ADVAIR DISKUS 500-50 MCG/DOSE AEPB USE ONE INHALATION BY MOUTH EVERY 12 HOURS FOR ASTHMA   Yes Deneise Lever, MD  ALPRAZolam Duanne Moron) 0.5 MG tablet Take 1 tablet by mouth 3 (three) times daily as needed for anxiety or sleep.  10/02/12  Yes Historical Provider, MD  EPINEPHrine (EPI-PEN) 0.3 mg/0.3 mL DEVI Inject 0.3 mg into the muscle once. As needed for severe allergic reaction 04/07/12  Yes Milana Huntsman  Readling, MD  famotidine (PEPCID) 20 MG tablet Take 1 tablet (20 mg total) by mouth 2 (two) times daily. For stomach acid. 04/07/12  Yes Mellissa Kohut, MD  lamoTRIgine (LAMICTAL) 25 MG tablet Take 75 mg by mouth 2 (two) times daily.   Yes Historical Provider, MD  levETIRAcetam (KEPPRA) 500 MG tablet Take 1 tablet (500 mg total) by mouth 2 (two) times daily. 10/09/13  Yes Drema Dallas, DO  metoprolol succinate (TOPROL-XL) 50 MG 24 hr tablet Take 50 mg by mouth at bedtime. Take with or immediately following a meal for blood pressure control. 04/07/12  Yes Mellissa Kohut, MD  Multiple Vitamin (MULTIVITAMIN WITH MINERALS) TABS Take 1 tablet by mouth daily. For nutritional supplementation. 04/07/12  Yes Milana Huntsman Readling, MD  PROAIR HFA 108 (90 BASE) MCG/ACT inhaler INHALE 2 PUFFS BY MOUTH EVERY 4 HOURS AS NEEDED FOR WHEEZING OR SHORTNESS OF BREATH   Yes Deneise Lever, MD  traZODone (DESYREL) 150 MG tablet Take 150 mg by mouth at bedtime.   Yes Historical Provider, MD  trifluoperazine (STELAZINE) 2 MG tablet Take 2 mg by mouth at bedtime.   Yes Historical Provider, MD  ciprofloxacin (CIPRO) 500 MG tablet Take 1 tablet (500 mg total) by mouth 2 (two) times daily. Patient not taking: Reported on 09/11/2016 03/31/16   Fredia Sorrow, MD  ciprofloxacin (CIPRO) 500 MG tablet Take 1 tablet (500 mg total) by mouth 2 (two) times daily. Patient not taking: Reported on 09/11/2016 03/31/16   Fredia Sorrow, MD  oxyCODONE-acetaminophen (ROXICET) 5-325 MG tablet Take 1-2 tablets by mouth every 4 (four) hours as needed. Patient not taking: Reported on 03/31/2016 06/06/15   Erroll Luna, MD    Family History Family History  Problem Relation Age of Onset  . Heart disease Father   . Ovarian cancer Mother   . Colon cancer Neg Hx   . Esophageal cancer Neg Hx   . Stomach cancer Neg Hx   . Rectal cancer Neg Hx     Social History Social History  Substance Use Topics  . Smoking status: Former Smoker    Packs/day:  1.00    Years: 5.00    Quit date: 07/07/2007  . Smokeless tobacco: Never Used  . Alcohol use No     Allergies   Bee venom; Depakote [divalproex sodium]; Latex; Peanut-containing drug products; Penicillins; Seroquel [quetiapine]; Shellfish-derived products; Amitriptyline hcl; Butorphanol tartrate; Gabapentin; and Iodine   Review of Systems Review of Systems All other systems negative except as documented in the HPI. All pertinent positives and negatives as reviewed in the HPI.  Physical Exam Updated Vital Signs BP 128/85   Pulse 69   Temp 97.9 F (36.6 C) (Oral)   Resp 19   Ht 5\' 9"  (1.753 m)   Wt 104.3 kg   SpO2 96%   BMI 33.97 kg/m   Physical Exam  Constitutional: She is oriented to person, place, and time. She appears well-developed and well-nourished. No distress.  HENT:  Head: Normocephalic and atraumatic.  Mouth/Throat: Oropharynx is clear and moist.  Eyes: Pupils are equal, round, and reactive to light.  Neck: Normal range of motion. Neck supple.  Cardiovascular: Normal rate, regular rhythm and normal heart sounds.  Exam reveals no gallop and no friction rub.   No murmur heard. Pulmonary/Chest: [place] normal and breath sounds normal. No respiratory distress. She has no wheezes.  Abdominal: Soft. Bowel sounds are normal. She exhibits no distension and no mass. There is tenderness. There is no rebound and no guarding.  Neurological: She is alert and oriented to person, place, and time. She exhibits normal muscle tone. Coordination normal.  Skin: Skin is warm and dry. Capillary refill takes less than 2 seconds. No rash noted. No erythema.  Psychiatric: She has a normal mood and affect. Her behavior is normal.  Nursing note and vitals reviewed.    ED Treatments / Results  Labs (all labs ordered are listed, but only abnormal results are displayed) Labs Reviewed  COMPREHENSIVE METABOLIC PANEL - Abnormal; Notable for the following:       Result Value   Creatinine,  Ser 1.02 (*)    Calcium 8.8 (*)    Albumin 3.4 (*)    All other components within normal limits  CBC - Abnormal; Notable for the following:    RDW 16.0 (*)    All other components within normal limits  URINALYSIS, ROUTINE W REFLEX MICROSCOPIC - Abnormal; Notable for the following:    Color, Urine STRAW (*)    Hgb urine dipstick SMALL (*)    Leukocytes, UA LARGE (*)    Squamous Epithelial / LPF 0-5 (*)    All other components within normal limits  LIPASE, BLOOD    EKG  EKG Interpretation  Date/Time:  Thursday September 10 2016 22:32:32 EST Ventricular Rate:  73 PR Interval:  178 QRS Duration: 80 QT Interval:  362 QTC Calculation: 398 R Axis:   52 Text Interpretation:  Normal sinus rhythm Nonspecific T wave abnormality Abnormal ECG No significant change since last tracing Confirmed by WARD,  DO, KRISTEN [phone]) on 09/11/2016 5:13:33 AM       Radiology Ct Abdomen Pelvis Wo Contrast  Result Date: 09/11/2016 CLINICAL DATA:  Left lower quadrant pain and nausea, onset this morning EXAM: CT ABDOMEN AND PELVIS WITHOUT CONTRAST TECHNIQUE: Multidetector CT imaging of the abdomen and pelvis was performed following the standard protocol without IV contrast. COMPARISON:  12/25/2011 FINDINGS: Lower chest: No acute abnormality. Hepatobiliary: No focal liver abnormality is seen. Status post cholecystectomy. No biliary dilatation. Pancreas: Unremarkable. No pancreatic ductal dilatation or surrounding inflammatory changes. Spleen: Normal in size without focal abnormality. Adrenals/Urinary Tract: Adrenal glands are unremarkable. Kidneys are normal, without renal calculi, focal lesion, or hydronephrosis. Bladder is unremarkable. Stomach/Bowel: Probable fundoplication. Stomach and small bowel are otherwise unremarkable. Colon is unremarkable. No bowel obstruction. No acute inflammation of bowel. Vascular/Lymphatic: No significant vascular findings are present. No enlarged abdominal or pelvic lymph nodes.  Reproductive: Hysterectomy.  No adnexal abnormalities. Other: No acute inflammation in the abdomen or pelvis.  No ascites. Musculoskeletal: No significant skeletal lesion. IMPRESSION: No significant abnormality. Electronically Signed   By: Andreas Newport M.D.   On: 09/11/2016 03:45    Procedures Procedures (including critical care time)  Medications Ordered in ED Medications  Barium Sulfate 2.1 % SUSP (  Canceled Entry 09/11/16 0433)  sodium chloride 0.9 %  bolus 1,000 mL (0 mLs Intravenous Stopped 09/11/16 0157)  morphine 4 MG/ML injection 4 mg (4 mg Intravenous Given 09/11/16 0131)  ketorolac (TORADOL) 30 MG/ML injection 30 mg (30 mg Intravenous Given 09/11/16 0356)     Initial Impression / Assessment and Plan / ED Course  I have reviewed the triage vital signs and the nursing notes.  Pertinent labs & imaging results that were available during my care of the patient were reviewed by me and considered in my medical decision making (see chart for details).     The patient will need admission to the hospital for what appears to be an MS exacerbation.  The patient was noted to have difficulty walking from triage.  She also required assistance to get out of the bathroom by 2 staff members that she was too weak to ambulate and use the bathroom on her own.  She states that she had trouble moving her arms down from when she had the CT scan.  I spoke to neurology who will see the patient also spoke with the [facility] will admit for further evaluation of what appears to be an MS flare. On examination after being alerted by the nurse and this finding.  Patient does have bilateral weakness in her extremities, upper and lower.  Final Clinical Impressions(s) / ED Diagnoses   Final diagnoses:  Left lower quadrant pain  MS (multiple sclerosis) ([place])    New Prescriptions New Prescriptions   No medications on file     Dalia Heading, PA-C 09/11/16 0631    Dalia Heading,  PA-C 09/11/16 [place], DO 09/11/16 [phone]

## 2016-09-11 NOTE — ED Notes (Signed)
Pt very unsteady on her feet, almost fall getting out of bed and getting back to bed. PA student notified.

## 2016-09-11 NOTE — ED Notes (Signed)
Pt taken to MRI  

## 2016-09-11 NOTE — ED Notes (Signed)
Paged Neuro MD requesting Ativan for MRI

## 2016-09-11 NOTE — ED Notes (Signed)
Patient transported to MRI 

## 2016-09-12 DIAGNOSIS — R1013 Epigastric pain: Secondary | ICD-10-CM

## 2016-09-12 LAB — CBC
HCT: 35.5 % — ABNORMAL LOW (ref 36.0–46.0)
Hemoglobin: 11.7 g/dL — ABNORMAL LOW (ref 12.0–15.0)
MCH: 27.6 pg (ref 26.0–34.0)
MCHC: 33 g/dL (ref 30.0–36.0)
MCV: 83.7 fL (ref 78.0–100.0)
Platelets: 289 10*3/uL (ref 150–400)
RBC: 4.24 MIL/uL (ref 3.87–5.11)
RDW: 16.3 % — ABNORMAL HIGH (ref 11.5–15.5)
WBC: 4.9 10*3/uL (ref 4.0–10.5)

## 2016-09-12 LAB — BASIC METABOLIC PANEL
Anion gap: 10 (ref 5–15)
BUN: 11 mg/dL (ref 6–20)
CO2: 24 mmol/L (ref 22–32)
Calcium: 9 mg/dL (ref 8.9–10.3)
Chloride: 104 mmol/L (ref 101–111)
Creatinine, Ser: 0.85 mg/dL (ref 0.44–1.00)
GFR calc Af Amer: >60 mL/min (ref >60)
GFR calc non Af Amer: >60 mL/min (ref >60)
Glucose, Bld: 96 mg/dL (ref 65–99)
Potassium: 3.7 mmol/L (ref 3.5–5.1)
Sodium: 138 mmol/L (ref 135–145)

## 2016-09-12 MED ORDER — METHOCARBAMOL 500 MG PO TABS
250.0000 mg | ORAL_TABLET | Freq: Three times a day (TID) | ORAL | 0 refills | Status: AC | PRN
Start: 2016-09-12 — End: 2016-09-22

## 2016-09-12 MED ORDER — POLYETHYLENE GLYCOL 3350 17 G PO PACK: 17.0000 g | PACK | Freq: Every day | ORAL | 0 refills | Status: DC | PRN

## 2016-09-12 MED ORDER — LEVETIRACETAM 500 MG PO TABS: 500.0000 mg | ORAL_TABLET | Freq: Two times a day (BID) | ORAL | 2 refills | Status: DC

## 2016-09-12 NOTE — Evaluation (Signed)
Physical Therapy Evaluation Patient Details Name: [name] MRN: [id number] DOB: [birth year] Today's Date: 09/12/2016   History of Present Illness  56 y.o. female  with a history of multiple sclerosis, obstructive sleep apnea, bipolar disorder, anxiety, seizure disorder, who initially presented with LLQ abdominal pain and nausea  Clinical Impression  Pt admitted with above symptoms. Pt currently with functional limitations due to the deficits listed below (see PT Problem List). Demonstrates general weakness. Wants to go to nursing home. No buckling noted with gait, using a rolling walker and Min assist for mild balance difficulty. She states her sister can care for her 24/7 if she cannot go to nursing home. She has DME except for a walker which she should use at this time. Pt will benefit from skilled PT to increase their independence and safety with mobility to allow discharge to the venue listed below.       Follow Up Recommendations SNF (If pt not candidate for SNF consider HHPT)    Equipment Recommendations  Rolling walker with 5" wheels    Recommendations for Other Services       Precautions / Restrictions Precautions Precautions: Fall Restrictions Weight Bearing Restrictions: No      Mobility  Bed Mobility Overal bed mobility: Modified Independent             General bed mobility comments: extra time  Transfers Overall transfer level: Needs assistance Equipment used: Rolling walker (2 wheeled) Transfers: Sit to/from Stand Sit to Stand: Supervision         General transfer comment: supervision for safety. Cues for hand placement  Ambulation/Gait Ambulation/Gait assistance: Min assist Ambulation Distance (Feet): 75 Feet Assistive device: Rolling walker (2 wheeled) Gait Pattern/deviations: Step-through pattern;Decreased stride length;Decreased stance time - left;Staggering right;Shuffle Gait velocity: slow Gait velocity interpretation: <1.8 ft/sec,  indicative of risk for recurrent falls General Gait Details: Intermittently staggering towards right requiring min assist to correct. Moderate verbal cues for walker placement and sequencing. No buckling noted. Shuffled gait, slow.  Stairs            Wheelchair Mobility    Modified Rankin (Stroke Patients Only) Modified Rankin (Stroke Patients Only) Pre-Morbid Rankin Score: Moderately severe disability Modified Rankin: Moderately severe disability     Balance Overall balance assessment: Needs assistance Sitting-balance support: No upper extremity supported;Feet supported Sitting balance-Leahy Scale: Normal     Standing balance support: No upper extremity supported Standing balance-Leahy Scale: Fair                               Pertinent Vitals/Pain Pain Assessment: 0-10 Pain Score: 1  Pain Location: abdomen Pain Descriptors / Indicators: Aching Pain Intervention(s): Monitored during session;Repositioned    Home Living Family/patient expects to be discharged to:: Skilled nursing facility Living Arrangements: Other relatives (Sister) Available Help at Discharge: Family;Available 24 hours/day Type of Home: House Home Access: Stairs to enter Entrance Stairs-Rails: Right Entrance Stairs-Number of Steps: 3 Home Layout: Two level;Bed/bath upstairs Home Equipment: Shower seat;Bedside commode      Prior Function Level of Independence: Needs assistance   Gait / Transfers Assistance Needed: ind  ADL's / Homemaking Assistance Needed: sister occasionally helps with bath/dress        Hand Dominance   Dominant Hand: Right    Extremity/Trunk Assessment   Upper Extremity Assessment Upper Extremity Assessment: Defer to OT evaluation    Lower Extremity Assessment Lower Extremity Assessment: Generalized weakness  Communication   Communication: No difficulties  Cognition Arousal/Alertness: Awake/alert Behavior During Therapy: WFL for tasks  assessed/performed Overall Cognitive Status: Within Functional Limits for tasks assessed                      General Comments      Exercises     Assessment/Plan    PT Assessment Patient needs continued PT services  PT Problem List Decreased strength;Decreased activity tolerance;Decreased balance;Decreased mobility;Decreased coordination;Decreased knowledge of use of DME;Impaired sensation;Impaired tone;Pain;Obesity       PT Treatment Interventions Gait training;DME instruction;Stair training;Functional mobility training;Therapeutic activities;Therapeutic exercise;Neuromuscular re-education;Balance training;Patient/family education    PT Goals (Current goals can be found in the Care Plan section)  Acute Rehab PT Goals Patient Stated Goal: Go to rehab PT Goal Formulation: With patient Time For Goal Achievement: 09/26/16 Potential to Achieve Goals: Good    Frequency Min 3X/week   Barriers to discharge Inaccessible home environment flight of steps to bedroom without rails.    Co-evaluation               End of Session Equipment Utilized During Treatment: Gait belt Activity Tolerance: No increased pain;Patient limited by fatigue Patient left: in bed;with call bell/phone within reach;with bed alarm set Nurse Communication: Mobility status PT Visit Diagnosis: Unsteadiness on feet (R26.81);Muscle weakness (generalized) (M62.81);Other symptoms and signs involving the nervous system (E78.412)         Time: 8208-1388 PT Time Calculation (min) (ACUTE ONLY): 22 min   Charges:   PT Evaluation $PT Eval Moderate Complexity: 1 Procedure     PT G CodesEllouise Newer 09/12/2016, 10:05 AM [phone]

## 2016-09-12 NOTE — Progress Notes (Signed)
[name] to be D/C'd home per MD order.  Discussed with the patient and all questions fully answered.  VSS, Skin clean, dry and intact without evidence of skin break down, no evidence of skin tears noted. IV catheter discontinued intact. Site without signs and symptoms of complications. Dressing and pressure applied.  An After Visit Summary was printed and given to the patient. Patient received prescription.  D/c education completed with patient/family including follow up instructions, medication list, d/c activities limitations if indicated, with other d/c instructions as indicated by MD - patient able to verbalize understanding, all questions fully answered.   Patient instructed to return to ED, call 911, or call MD for any changes in condition.   Patient escorted via [place], and D/C home via private auto.  [name] 09/12/2016 3:41 PM

## 2016-09-12 NOTE — Care Management Note (Signed)
Case Management Note  Patient Details  Name: [name] MRN: [id number] Date of Birth: 05-May-1961  Subjective/Objective:              Patient with MD order to DC to home. Spoke with patient at bedside would like to use [facility] for [facility] PT OT HHA as ordered. Referral made to Danny Lawless, clinical liaison [facility]. Referral made to [facility] for DME RW to be delivered to room prior to discharge.       Action/Plan:  DC to home with HH.  Expected Discharge Date:  09/12/16               Expected Discharge Plan:  [name]  In-House Referral:     Discharge planning Services  CM Consult  Post Acute Care Choice:  Home Health, Durable Medical Equipment Choice offered to:  Patient  DME Arranged:  Walker rolling DME Agency:  [place]:  PT, OT HH Agency:   Nanine Means)  Status of Service:  Completed, signed off  If discussed at [place] of Stay Meetings, dates discussed:    Additional Comments:  Carles Collet, RN 09/12/2016, 2:04 PM

## 2016-09-12 NOTE — Discharge Summary (Addendum)
Physician Discharge Summary  [name] MRN: [id number] DOB/AGE: July 18, 1960 56 y.o.  PCP: Ricke Hey, MD   Admit date: 09/11/2016 Discharge date: 09/12/2016  Discharge Diagnoses:    Principal Problem:   Weakness Active Problems:   OSA (obstructive sleep apnea)   G E R D   Chronic back pain   Abdominal pain   Major depressive disorder, recurrent, severe with psychotic features ([place])   Generalized anxiety disorder   Seizure disorder ([place])    Follow-up recommendations Follow-up with PCP in 3-5 days , including all  additional recommended appointments as below Follow-up CBC, CMP in 3-5 days Patient declined SNF at discharge, discharged with home health       Current Discharge Medication List    START taking these medications   Details  methocarbamol (ROBAXIN) 500 MG tablet Take 0.5 tablets (250 mg total) by mouth every 8 (eight) hours as needed for muscle spasms. Qty: 20 tablet, Refills: 0    polyethylene glycol (MIRALAX / GLYCOLAX) packet Take 17 g by mouth daily as needed for mild constipation. Qty: 14 each, Refills: 0      CONTINUE these medications which have CHANGED   Details  levETIRAcetam (KEPPRA) 500 MG tablet Take 1 tablet (500 mg total) by mouth 2 (two) times daily. Qty: 60 tablet, Refills: 2      CONTINUE these medications which have NOT CHANGED   Details  ADVAIR DISKUS 500-50 MCG/DOSE AEPB USE ONE INHALATION BY MOUTH EVERY 12 HOURS FOR ASTHMA Qty: 60 each, Refills: 5    ALPRAZolam (XANAX) 0.5 MG tablet Take 1 tablet by mouth 3 (three) times daily as needed for anxiety or sleep.     EPINEPHrine (EPI-PEN) 0.3 mg/0.3 mL DEVI Inject 0.3 mg into the muscle once. As needed for severe allergic reaction    famotidine (PEPCID) 20 MG tablet Take 1 tablet (20 mg total) by mouth 2 (two) times daily. For stomach acid. Qty: 60 tablet, Refills: 0    FLUoxetine (PROZAC) 20 MG capsule Take 20 mg by mouth daily.    HYDROcodone-acetaminophen (NORCO)  10-325 MG tablet Take 1 tablet by mouth 3 (three) times daily as needed for moderate pain.    lamoTRIgine (LAMICTAL) 25 MG tablet Take 75 mg by mouth 2 (two) times daily.    metoprolol succinate (TOPROL-XL) 50 MG 24 hr tablet Take 50 mg by mouth at bedtime. Take with or immediately following a meal for blood pressure control.    Multiple Vitamin (MULTIVITAMIN WITH MINERALS) TABS Take 1 tablet by mouth daily. For nutritional supplementation. Qty: 30 tablet, Refills: 0    PROAIR HFA 108 (90 BASE) MCG/ACT inhaler INHALE 2 PUFFS BY MOUTH EVERY 4 HOURS AS NEEDED FOR WHEEZING OR SHORTNESS OF BREATH Qty: 8.5 g, Refills: 5    traZODone (DESYREL) 150 MG tablet Take 150 mg by mouth at bedtime.    trifluoperazine (STELAZINE) 2 MG tablet Take 2 mg by mouth at bedtime.         Discharge Condition: Stable Discharge Instructions Get Medicines reviewed and adjusted: Please take all your medications with you for your next visit with your Primary MD  Please request your Primary MD to go over all hospital tests and procedure/radiological results at the follow up, please ask your Primary MD to get all Hospital records sent to his/her office.  If you experience worsening of your admission symptoms, develop shortness of breath, life threatening emergency, suicidal or homicidal thoughts you must seek medical attention immediately by calling 911 or calling  your MD immediately if symptoms less severe.  You must read complete instructions/literature along with all the possible adverse reactions/side effects for all the Medicines you take and that have been prescribed to you. Take any new Medicines after you have completely understood and accpet all the possible adverse reactions/side effects.   Do not drive when taking Pain medications.   Do not take more than prescribed Pain, Sleep and Anxiety Medications  Special Instructions: If you have smoked or chewed Tobacco in the last 2 yrs please stop smoking,  stop any regular Alcohol and or any Recreational drug use.  Wear Seat belts while driving.  Please note  You were cared for by a hospitalist during your hospital stay. Once you are discharged, your primary care physician will handle any further medical issues. Please note that NO REFILLS for any discharge medications will be authorized once you are discharged, as it is imperative that you return to your primary care physician (or establish a relationship with a primary care physician if you do not have one) for your aftercare needs so that they can reassess your need for medications and monitor your lab values.     Allergies  Allergen Reactions  . Bee Venom Anaphylaxis  . Depakote [Divalproex Sodium] Shortness Of Breath and Rash  . Latex Anaphylaxis  . Peanut-Containing Drug Products Anaphylaxis  . Penicillins Anaphylaxis  . Seroquel [Quetiapine] Anaphylaxis  . Shellfish-Derived Products Anaphylaxis  . Amitriptyline Hcl Other (See Comments)    REACTION: HALLUCINATIONS  . Butorphanol Tartrate Other (See Comments)    REACTION: Hallucinations  . Gabapentin Other (See Comments)    Pt reports severe back and side pain  . Iodine     REACTION: Flushing and fainting      Disposition: 01-Home or Self Care   Consults: * Neurology   Significant Diagnostic Studies:  Ct Abdomen Pelvis Wo Contrast  Result Date: 09/11/2016 CLINICAL DATA:  Left lower quadrant pain and nausea, onset this morning EXAM: CT ABDOMEN AND PELVIS WITHOUT CONTRAST TECHNIQUE: Multidetector CT imaging of the abdomen and pelvis was performed following the standard protocol without IV contrast. COMPARISON:  12/25/2011 FINDINGS: Lower chest: No acute abnormality. Hepatobiliary: No focal liver abnormality is seen. Status post cholecystectomy. No biliary dilatation. Pancreas: Unremarkable. No pancreatic ductal dilatation or surrounding inflammatory changes. Spleen: Normal in size without focal abnormality. Adrenals/Urinary  Tract: Adrenal glands are unremarkable. Kidneys are normal, without renal calculi, focal lesion, or hydronephrosis. Bladder is unremarkable. Stomach/Bowel: Probable fundoplication. Stomach and small bowel are otherwise unremarkable. Colon is unremarkable. No bowel obstruction. No acute inflammation of bowel. Vascular/Lymphatic: No significant vascular findings are present. No enlarged abdominal or pelvic lymph nodes. Reproductive: Hysterectomy.  No adnexal abnormalities. Other: No acute inflammation in the abdomen or pelvis.  No ascites. Musculoskeletal: No significant skeletal lesion. IMPRESSION: No significant abnormality. Electronically Signed   By: Andreas Newport M.D.   On: 09/11/2016 03:45   Dg Chest 2 View  Result Date: 09/11/2016 CLINICAL DATA:  Nat Christen all over worse on left side,hx ms,htn,asthma EXAM: CHEST - 2 VIEW COMPARISON:  03/31/2016 FINDINGS: Lungs are clear. Heart size and mediastinal contours are within normal limits. No effusion. Visualized bones unremarkable. IMPRESSION: No acute cardiopulmonary disease. Electronically Signed   By: Lucrezia Europe M.D.   On: 09/11/2016 07:54   Mr Cervical Spine Wo Contrast  Result Date: 09/11/2016 CLINICAL DATA:  56 year old with upper and lower extremity weakness worsening since yesterday. Unable to ambulate today. History of multiple sclerosis. Evaluate for cord lesion/compression.  EXAM: MRI CERVICAL, THORACIC AND LUMBAR SPINE WITHOUT CONTRAST TECHNIQUE: Multiplanar and multiecho pulse sequences of the cervical spine, to include the craniocervical junction and cervicothoracic junction, and thoracic and lumbar spine, were obtained without intravenous contrast. COMPARISON:  MRI lumbar spine 08/14/2011. Chest CT 09/21/2010 and abdominal CT 09/11/2016. FINDINGS: Despite efforts by the technologist and patient, mild motion artifact is present on today's exam and could not be eliminated. This reduces exam sensitivity and specificity. MRI CERVICAL SPINE  FINDINGS Alignment: Normal aside from mild scoliosis. Vertebrae: No evidence of acute fracture or focal marrow lesion. Cord: Normal in signal and caliber. No cord expansion or focal cord lesion demonstrated. Posterior Fossa, vertebral arteries, paraspinal tissues: The visualized posterior fossa appears normal. There are bilateral vertebral artery flow voids. No paraspinal abnormalities are seen. Disc levels: At C5-6, there is mild disc bulging with asymmetric uncinate spurring on the right contributing to mild right foraminal narrowing. At C6-7, there is a small disc protrusion in the right subarticular zone. There is no resulting cord deformity or foraminal compromise. The additional disc space levels appear normal. MRI THORACIC SPINE FINDINGS Alignment:  Mild scoliosis.  Otherwise normal. Vertebrae: No fracture, evidence of discitis, or bone lesion. Cord: The thoracic cord is normal in signal and caliber. The conus medullaris extends to the L1-2 disc space and appears normal. Paraspinal and other soft tissues: Unremarkable. Disc levels: The thoracic discs appear well hydrated with maintained height. No evidence of disc herniation, spinal stenosis or nerve root encroachment. MRI LUMBAR SPINE FINDINGS Segmentation:  Conventional. Alignment:  Minimal anterolisthesis at L5-S1.  Otherwise normal. Vertebrae:  No fracture, evidence of discitis, or bone lesion. Conus medullaris: Extends to the L1-2 level and appears normal. Paraspinal and other soft tissues: Unremarkable. The urinary bladder is moderately distended. Disc levels: Disc height and hydration are relatively maintained. There is mild disc bulging and facet hypertrophy at the lower 3 levels. No disc herniation, spinal stenosis or nerve root encroachment is seen. IMPRESSION: 1. No evidence of cord lesion or compression. 2. No large disc herniation, spinal stenosis or nerve root encroachment. 3. Mild disc bulging with asymmetric uncinate spurring on the right at  C5-6, contributing to mild right foraminal narrowing. 4. Small disc protrusion in the right subarticular zone at C6-7. 5. Mild disc bulging and facet hypertrophy at L3-4, L4-5 and L5-S1. 6. Distended urinary bladder. Electronically Signed   By: Richardean Sale M.D.   On: 09/11/2016 13:30   Mr Thoracic Spine Wo Contrast  Result Date: 09/11/2016 CLINICAL DATA:  56 year old with upper and lower extremity weakness worsening since yesterday. Unable to ambulate today. History of multiple sclerosis. Evaluate for cord lesion/compression. EXAM: MRI CERVICAL, THORACIC AND LUMBAR SPINE WITHOUT CONTRAST TECHNIQUE: Multiplanar and multiecho pulse sequences of the cervical spine, to include the craniocervical junction and cervicothoracic junction, and thoracic and lumbar spine, were obtained without intravenous contrast. COMPARISON:  MRI lumbar spine 08/14/2011. Chest CT 09/21/2010 and abdominal CT 09/11/2016. FINDINGS: Despite efforts by the technologist and patient, mild motion artifact is present on today's exam and could not be eliminated. This reduces exam sensitivity and specificity. MRI CERVICAL SPINE FINDINGS Alignment: Normal aside from mild scoliosis. Vertebrae: No evidence of acute fracture or focal marrow lesion. Cord: Normal in signal and caliber. No cord expansion or focal cord lesion demonstrated. Posterior Fossa, vertebral arteries, paraspinal tissues: The visualized posterior fossa appears normal. There are bilateral vertebral artery flow voids. No paraspinal abnormalities are seen. Disc levels: At C5-6, there is mild disc bulging with  asymmetric uncinate spurring on the right contributing to mild right foraminal narrowing. At C6-7, there is a small disc protrusion in the right subarticular zone. There is no resulting cord deformity or foraminal compromise. The additional disc space levels appear normal. MRI THORACIC SPINE FINDINGS Alignment:  Mild scoliosis.  Otherwise normal. Vertebrae: No fracture, evidence  of discitis, or bone lesion. Cord: The thoracic cord is normal in signal and caliber. The conus medullaris extends to the L1-2 disc space and appears normal. Paraspinal and other soft tissues: Unremarkable. Disc levels: The thoracic discs appear well hydrated with maintained height. No evidence of disc herniation, spinal stenosis or nerve root encroachment. MRI LUMBAR SPINE FINDINGS Segmentation:  Conventional. Alignment:  Minimal anterolisthesis at L5-S1.  Otherwise normal. Vertebrae:  No fracture, evidence of discitis, or bone lesion. Conus medullaris: Extends to the L1-2 level and appears normal. Paraspinal and other soft tissues: Unremarkable. The urinary bladder is moderately distended. Disc levels: Disc height and hydration are relatively maintained. There is mild disc bulging and facet hypertrophy at the lower 3 levels. No disc herniation, spinal stenosis or nerve root encroachment is seen. IMPRESSION: 1. No evidence of cord lesion or compression. 2. No large disc herniation, spinal stenosis or nerve root encroachment. 3. Mild disc bulging with asymmetric uncinate spurring on the right at C5-6, contributing to mild right foraminal narrowing. 4. Small disc protrusion in the right subarticular zone at C6-7. 5. Mild disc bulging and facet hypertrophy at L3-4, L4-5 and L5-S1. 6. Distended urinary bladder. Electronically Signed   By: Richardean Sale M.D.   On: 09/11/2016 13:30   Mr Lumbar Spine Wo Contrast  Result Date: 09/11/2016 CLINICAL DATA:  56 year old with upper and lower extremity weakness worsening since yesterday. Unable to ambulate today. History of multiple sclerosis. Evaluate for cord lesion/compression. EXAM: MRI CERVICAL, THORACIC AND LUMBAR SPINE WITHOUT CONTRAST TECHNIQUE: Multiplanar and multiecho pulse sequences of the cervical spine, to include the craniocervical junction and cervicothoracic junction, and thoracic and lumbar spine, were obtained without intravenous contrast. COMPARISON:  MRI  lumbar spine 08/14/2011. Chest CT 09/21/2010 and abdominal CT 09/11/2016. FINDINGS: Despite efforts by the technologist and patient, mild motion artifact is present on today's exam and could not be eliminated. This reduces exam sensitivity and specificity. MRI CERVICAL SPINE FINDINGS Alignment: Normal aside from mild scoliosis. Vertebrae: No evidence of acute fracture or focal marrow lesion. Cord: Normal in signal and caliber. No cord expansion or focal cord lesion demonstrated. Posterior Fossa, vertebral arteries, paraspinal tissues: The visualized posterior fossa appears normal. There are bilateral vertebral artery flow voids. No paraspinal abnormalities are seen. Disc levels: At C5-6, there is mild disc bulging with asymmetric uncinate spurring on the right contributing to mild right foraminal narrowing. At C6-7, there is a small disc protrusion in the right subarticular zone. There is no resulting cord deformity or foraminal compromise. The additional disc space levels appear normal. MRI THORACIC SPINE FINDINGS Alignment:  Mild scoliosis.  Otherwise normal. Vertebrae: No fracture, evidence of discitis, or bone lesion. Cord: The thoracic cord is normal in signal and caliber. The conus medullaris extends to the L1-2 disc space and appears normal. Paraspinal and other soft tissues: Unremarkable. Disc levels: The thoracic discs appear well hydrated with maintained height. No evidence of disc herniation, spinal stenosis or nerve root encroachment. MRI LUMBAR SPINE FINDINGS Segmentation:  Conventional. Alignment:  Minimal anterolisthesis at L5-S1.  Otherwise normal. Vertebrae:  No fracture, evidence of discitis, or bone lesion. Conus medullaris: Extends to the L1-2 level and  appears normal. Paraspinal and other soft tissues: Unremarkable. The urinary bladder is moderately distended. Disc levels: Disc height and hydration are relatively maintained. There is mild disc bulging and facet hypertrophy at the lower 3 levels.  No disc herniation, spinal stenosis or nerve root encroachment is seen. IMPRESSION: 1. No evidence of cord lesion or compression. 2. No large disc herniation, spinal stenosis or nerve root encroachment. 3. Mild disc bulging with asymmetric uncinate spurring on the right at C5-6, contributing to mild right foraminal narrowing. 4. Small disc protrusion in the right subarticular zone at C6-7. 5. Mild disc bulging and facet hypertrophy at L3-4, L4-5 and L5-S1. 6. Distended urinary bladder. Electronically Signed   By: Richardean Sale M.D.   On: 09/11/2016 13:30        Filed Weights   09/10/16 2236 09/11/16 1552  Weight: 104.3 kg (230 lb) 104.4 kg (230 lb 1.6 oz)     Microbiology: No results found for this or any previous visit (from the past 240 hour(s)).     Blood Culture    Component Value Date/Time   SDES URINE, RANDOM 03/31/2016 0918   SPECREQUEST ADDED 144818 1111 03/31/2016 0918   CULT MULTIPLE SPECIES PRESENT, SUGGEST RECOLLECTION (A) 03/31/2016 0918   REPTSTATUS 04/01/2016 FINAL 03/31/2016 0918      Labs: Results for orders placed or performed during the hospital encounter of 09/11/16 (from the past 48 hour(s))  Lipase, blood     Status: None   Collection Time: 09/10/16 10:39 PM  Result Value Ref Range   Lipase 20 11 - 51 U/L  Comprehensive metabolic panel     Status: Abnormal   Collection Time: 09/10/16 10:39 PM  Result Value Ref Range   Sodium 136 135 - 145 mmol/L   Potassium 3.7 3.5 - 5.1 mmol/L   Chloride 104 101 - 111 mmol/L   CO2 25 22 - 32 mmol/L   Glucose, Bld 99 65 - 99 mg/dL   BUN 14 6 - 20 mg/dL   Creatinine, Ser 1.02 (H) 0.44 - 1.00 mg/dL   Calcium 8.8 (L) 8.9 - 10.3 mg/dL   Total Protein 7.8 6.5 - 8.1 g/dL   Albumin 3.4 (L) 3.5 - 5.0 g/dL   AST 27 15 - 41 U/L   ALT 46 14 - 54 U/L   Alkaline Phosphatase 58 38 - 126 U/L   Total Bilirubin 0.4 0.3 - 1.2 mg/dL   GFR calc non Af Amer >60 >60 mL/min   GFR calc Af Amer >60 >60 mL/min    Comment:  (NOTE) The eGFR has been calculated using the CKD EPI equation. This calculation has not been validated in all clinical situations. eGFR's persistently <60 mL/min signify possible Chronic Kidney Disease.    Anion gap 7 5 - 15  CBC     Status: Abnormal   Collection Time: 09/10/16 10:39 PM  Result Value Ref Range   WBC 7.0 4.0 - 10.5 K/uL   RBC 4.61 3.87 - 5.11 MIL/uL   Hemoglobin 12.8 12.0 - 15.0 g/dL   HCT 38.4 36.0 - 46.0 %   MCV 83.3 78.0 - 100.0 fL   MCH 27.8 26.0 - 34.0 pg   MCHC 33.3 30.0 - 36.0 g/dL   RDW 16.0 (H) 11.5 - 15.5 %   Platelets 328 150 - 400 K/uL  Urinalysis, Routine w reflex microscopic     Status: Abnormal   Collection Time: 09/11/16  4:18 AM  Result Value Ref Range   Color, Urine STRAW (A)  YELLOW   APPearance CLEAR CLEAR   Specific Gravity, Urine 1.009 1.005 - 1.030   pH 6.0 5.0 - 8.0   Glucose, UA NEGATIVE NEGATIVE mg/dL   Hgb urine dipstick SMALL (A) NEGATIVE   Bilirubin Urine NEGATIVE NEGATIVE   Ketones, ur NEGATIVE NEGATIVE mg/dL   Protein, ur NEGATIVE NEGATIVE mg/dL   Nitrite NEGATIVE NEGATIVE   Leukocytes, UA LARGE (A) NEGATIVE   RBC / HPF 0-5 0 - 5 RBC/hpf   WBC, UA 0-5 0 - 5 WBC/hpf   Bacteria, UA NONE SEEN NONE SEEN   Squamous Epithelial / LPF 0-5 (A) NONE SEEN   Mucous PRESENT   Cortisol     Status: None   Collection Time: 09/11/16  8:44 AM  Result Value Ref Range   Cortisol, Plasma 11.1 ug/dL    Comment: (NOTE) AM    6.7 - 22.6 ug/dL PM   <10.0       ug/dL   TSH     Status: None   Collection Time: 09/11/16  8:44 AM  Result Value Ref Range   TSH 0.493 0.350 - 4.500 uIU/mL    Comment: Performed by a 3rd Generation assay with a functional sensitivity of <=0.01 uIU/mL.  HIV antibody (Routine Testing)     Status: None   Collection Time: 09/11/16  8:44 AM  Result Value Ref Range   HIV Screen 4th Generation wRfx Non Reactive Non Reactive    Comment: (NOTE) Performed At: [facility] [place], [place]  [id number] Lindon Romp MD [id number]   CBC     Status: Abnormal   Collection Time: 09/11/16  8:44 AM  Result Value Ref Range   WBC 4.9 4.0 - 10.5 K/uL   RBC 4.54 3.87 - 5.11 MIL/uL   Hemoglobin 12.4 12.0 - 15.0 g/dL   HCT 37.8 36.0 - 46.0 %   MCV 83.3 78.0 - 100.0 fL   MCH 27.3 26.0 - 34.0 pg   MCHC 32.8 30.0 - 36.0 g/dL   RDW 16.0 (H) 11.5 - 15.5 %   Platelets 302 150 - 400 K/uL  Creatinine, serum     Status: None   Collection Time: 09/11/16  8:44 AM  Result Value Ref Range   Creatinine, Ser 0.85 0.44 - 1.00 mg/dL   GFR calc non Af Amer >60 >60 mL/min   GFR calc Af Amer >60 >60 mL/min    Comment: (NOTE) The eGFR has been calculated using the CKD EPI equation. This calculation has not been validated in all clinical situations. eGFR's persistently <60 mL/min signify possible Chronic Kidney Disease.   CBC     Status: Abnormal   Collection Time: 09/12/16  5:31 AM  Result Value Ref Range   WBC 4.9 4.0 - 10.5 K/uL   RBC 4.24 3.87 - 5.11 MIL/uL   Hemoglobin 11.7 (L) 12.0 - 15.0 g/dL   HCT 35.5 (L) 36.0 - 46.0 %   MCV 83.7 78.0 - 100.0 fL   MCH 27.6 26.0 - 34.0 pg   MCHC 33.0 30.0 - 36.0 g/dL   RDW 16.3 (H) 11.5 - 15.5 %   Platelets 289 150 - 400 K/uL  Basic metabolic panel     Status: None   Collection Time: 09/12/16  5:31 AM  Result Value Ref Range   Sodium 138 135 - 145 mmol/L   Potassium 3.7 3.5 - 5.1 mmol/L   Chloride 104 101 - 111 mmol/L   CO2 24 22 - 32 mmol/L   Glucose,  Bld 96 65 - 99 mg/dL   BUN 11 6 - 20 mg/dL   Creatinine, Ser 0.85 0.44 - 1.00 mg/dL   Calcium 9.0 8.9 - 10.3 mg/dL   GFR calc non Af Amer >60 >60 mL/min   GFR calc Af Amer >60 >60 mL/min    Comment: (NOTE) The eGFR has been calculated using the CKD EPI equation. This calculation has not been validated in all clinical situations. eGFR's persistently <60 mL/min signify possible Chronic Kidney Disease.    Anion gap 10 5 - 15     Lipid Panel  No results found for: CHOL, TRIG, HDL,  CHOLHDL, VLDL, LDLCALC, LDLDIRECT   Lab Results  Component Value Date   HGBA1C 6.6 (H) 11/14/2013   HGBA1C 5.9 (H) 04/05/2012     Lab Results  Component Value Date   CREATININE 0.85 09/12/2016     HPI :  56 y.o.female with a history of multiple sclerosis, obstructive sleep apnea, bipolar disorder, anxiety, seizure disorder, who initially presented with LLQ abdominal pain and nausea, symptoms started yesterday. Patient is also noted bilateral lower extremity weakness since yesterday. During CT scan, she had difficulty moving her arms due to weakness. At 4:00 AM today, she was noted to be very unsteady on her feet, almost falling when getting out of bed. ED team felt that the weakness might be due to an MS exacerbation. Neurology was consulted to further evaluation. Patient denies any recent seizure episodes. Neurology recommends MRI of the brain and the C-spine.  We also performed an MRI of lumbar spine to rule out cord compression, given lower extremity weakness. Cortisol within normal limits, TSH within normal limits. Patient will be admitted for observation overnight  HOSPITAL COURSE:   Weakness -Diffuse weakness subjectively, with several findings on exam that are referable to multiple neuroanatomical locations and could be secondary to MS. Prior outpatient Neurology note from [facility] documents her MS as being an equivocal diagnosis. She has since switched to [facility], states she is being treated there for MS, but is no longer on disease modifying therapy. MRI C-spine, thoracic spine, lumbar spine with results as above, no evidence of cord compression, no large disc herniation or spinal stenosis, mild disc bulging,L3-4, L4-5 and L5-S1. There does not need any intervention Continue Vicodin for DDD , added low-dose Robaxin Discussed with neurology, Dr. Melba Coon, no further workup or intervention required  Abdominal pain  Abdominal CT did not reveal  any significant acute findings UA dipstick was positive for small amount of hemoglobin, revealed large amount of WBC's and was nitrate negative , essentially negative UA Etiology of her abdominal pain is unclear and might be associated ovarian abnormality Cortisol, liver function, lipase within normal limits  GERD  Continue H2R blocker home doses  Hypertension - currently stable Continue home medication and adjust the doses if needed depending on the BP readings  Seizure disorder Continue home meds  Generalized anxiety and major derpessive disorder with psychotic features Continue home meds HIV negative   Discharge Exam:  Blood pressure 125/80, pulse 73, temperature 98.5 F (36.9 C), temperature source Oral, resp. rate 18, height _0  (1.753 m), weight 104.4 kg (230 lb 1.6 oz), SpO2 97 %.    Cardiovascular: RRR, no m/r/g. No JVD, carotid bruits. No LE edema.   Respiratory: bilateral no wheezes, rales, rhonchi or cracles. Normal respiratory effort. No accessory muscle use observed  Abdomen: soft, tender On palpation in the left lower quadrant, non-distended, no organomegaly or masses  appreciated. BS present in all quadrants  Skin: no rash, ulcers or induration seen on limited exam  Musculoskeletal: grossly normal tone BUE/BLE, good ROM, no bony abnormality or joint deformities observed  Psychiatric: grossly normal mood and affect, speech fluent and appropriate, alert and oriented x3  Neurologic: CN II-XII grossly intact, moves all extremities in coordinated fashion, sensation intact   Follow-up Information    Ricke Hey, MD Follow up.   Specialty:  Family Medicine Contact information: [place] [address] [phone]           Signed: Reyne Dumas 09/12/2016, 8:43 AM        Time spent >45 mins

## 2016-09-16 DIAGNOSIS — F250 Schizoaffective disorder, bipolar type: Secondary | ICD-10-CM | POA: Diagnosis not present

## 2016-09-17 DIAGNOSIS — G8929 Other chronic pain: Secondary | ICD-10-CM | POA: Diagnosis not present

## 2016-09-17 DIAGNOSIS — Z79891 Long term (current) use of opiate analgesic: Secondary | ICD-10-CM | POA: Diagnosis not present

## 2016-09-17 DIAGNOSIS — Z79899 Other long term (current) drug therapy: Secondary | ICD-10-CM | POA: Diagnosis not present

## 2016-09-17 DIAGNOSIS — M47816 Spondylosis without myelopathy or radiculopathy, lumbar region: Secondary | ICD-10-CM | POA: Diagnosis not present

## 2016-10-14 DIAGNOSIS — M47816 Spondylosis without myelopathy or radiculopathy, lumbar region: Secondary | ICD-10-CM | POA: Diagnosis not present

## 2016-10-14 DIAGNOSIS — G8929 Other chronic pain: Secondary | ICD-10-CM | POA: Diagnosis not present

## 2016-12-10 DIAGNOSIS — G8929 Other chronic pain: Secondary | ICD-10-CM | POA: Diagnosis not present

## 2016-12-10 DIAGNOSIS — M47816 Spondylosis without myelopathy or radiculopathy, lumbar region: Secondary | ICD-10-CM | POA: Diagnosis not present

## 2016-12-17 DIAGNOSIS — F250 Schizoaffective disorder, bipolar type: Secondary | ICD-10-CM | POA: Diagnosis not present

## 2017-03-10 DIAGNOSIS — F250 Schizoaffective disorder, bipolar type: Secondary | ICD-10-CM | POA: Diagnosis not present

## 2017-04-13 DIAGNOSIS — G8929 Other chronic pain: Secondary | ICD-10-CM | POA: Diagnosis not present

## 2017-04-13 DIAGNOSIS — M533 Sacrococcygeal disorders, not elsewhere classified: Secondary | ICD-10-CM | POA: Diagnosis not present

## 2017-04-13 DIAGNOSIS — Z9189 Other specified personal risk factors, not elsewhere classified: Secondary | ICD-10-CM | POA: Diagnosis not present

## 2017-04-13 DIAGNOSIS — M5441 Lumbago with sciatica, right side: Secondary | ICD-10-CM | POA: Diagnosis not present

## 2017-04-13 DIAGNOSIS — M7918 Myalgia, other site: Secondary | ICD-10-CM | POA: Diagnosis not present

## 2017-04-13 DIAGNOSIS — G894 Chronic pain syndrome: Secondary | ICD-10-CM | POA: Diagnosis not present

## 2017-05-11 DIAGNOSIS — Z9189 Other specified personal risk factors, not elsewhere classified: Secondary | ICD-10-CM | POA: Diagnosis not present

## 2017-05-11 DIAGNOSIS — G8929 Other chronic pain: Secondary | ICD-10-CM | POA: Diagnosis not present

## 2017-05-11 DIAGNOSIS — M5441 Lumbago with sciatica, right side: Secondary | ICD-10-CM | POA: Diagnosis not present

## 2017-05-11 DIAGNOSIS — M533 Sacrococcygeal disorders, not elsewhere classified: Secondary | ICD-10-CM | POA: Diagnosis not present

## 2017-05-11 DIAGNOSIS — M7918 Myalgia, other site: Secondary | ICD-10-CM | POA: Diagnosis not present

## 2017-05-11 DIAGNOSIS — G894 Chronic pain syndrome: Secondary | ICD-10-CM | POA: Diagnosis not present

## 2017-06-08 DIAGNOSIS — M533 Sacrococcygeal disorders, not elsewhere classified: Secondary | ICD-10-CM | POA: Diagnosis not present

## 2017-06-08 DIAGNOSIS — M7918 Myalgia, other site: Secondary | ICD-10-CM | POA: Diagnosis not present

## 2017-06-08 DIAGNOSIS — G8929 Other chronic pain: Secondary | ICD-10-CM | POA: Diagnosis not present

## 2017-06-08 DIAGNOSIS — M5441 Lumbago with sciatica, right side: Secondary | ICD-10-CM | POA: Diagnosis not present

## 2017-06-08 DIAGNOSIS — Z9189 Other specified personal risk factors, not elsewhere classified: Secondary | ICD-10-CM | POA: Diagnosis not present

## 2017-06-08 DIAGNOSIS — G894 Chronic pain syndrome: Secondary | ICD-10-CM | POA: Diagnosis not present

## 2017-08-17 DIAGNOSIS — Z01411 Encounter for gynecological examination (general) (routine) with abnormal findings: Secondary | ICD-10-CM | POA: Diagnosis not present

## 2017-08-17 DIAGNOSIS — R102 Pelvic and perineal pain: Secondary | ICD-10-CM | POA: Diagnosis not present

## 2017-08-30 ENCOUNTER — Other Ambulatory Visit: Payer: Self-pay | Admitting: Obstetrics and Gynecology

## 2017-08-30 DIAGNOSIS — N6452 Nipple discharge: Secondary | ICD-10-CM

## 2017-08-30 DIAGNOSIS — Z1231 Encounter for screening mammogram for malignant neoplasm of breast: Secondary | ICD-10-CM | POA: Diagnosis not present

## 2017-08-30 DIAGNOSIS — N649 Disorder of breast, unspecified: Secondary | ICD-10-CM | POA: Diagnosis not present

## 2017-08-31 DIAGNOSIS — F250 Schizoaffective disorder, bipolar type: Secondary | ICD-10-CM | POA: Diagnosis not present

## 2017-09-07 ENCOUNTER — Ambulatory Visit
Admission: RE | Admit: 2017-09-07 | Discharge: 2017-09-07 | Disposition: A | Discharge status: Home / Self Care | Payer: Medicare Other | Source: Ambulatory Visit | Attending: Obstetrics and Gynecology | Admitting: Obstetrics and Gynecology

## 2017-09-07 DIAGNOSIS — R928 Other abnormal and inconclusive findings on diagnostic imaging of breast: Secondary | ICD-10-CM | POA: Diagnosis not present

## 2017-09-07 DIAGNOSIS — N6452 Nipple discharge: Secondary | ICD-10-CM

## 2017-09-28 DIAGNOSIS — G8929 Other chronic pain: Secondary | ICD-10-CM | POA: Diagnosis not present

## 2017-09-28 DIAGNOSIS — M7918 Myalgia, other site: Secondary | ICD-10-CM | POA: Diagnosis not present

## 2017-09-28 DIAGNOSIS — M5441 Lumbago with sciatica, right side: Secondary | ICD-10-CM | POA: Diagnosis not present

## 2017-09-28 DIAGNOSIS — M533 Sacrococcygeal disorders, not elsewhere classified: Secondary | ICD-10-CM | POA: Diagnosis not present

## 2017-09-28 DIAGNOSIS — Z9189 Other specified personal risk factors, not elsewhere classified: Secondary | ICD-10-CM | POA: Diagnosis not present

## 2017-09-28 DIAGNOSIS — G894 Chronic pain syndrome: Secondary | ICD-10-CM | POA: Diagnosis not present

## 2017-10-01 IMAGING — MG MM DIAGNOSTIC UNILATERAL L
2 series · 2 of 2 positions shown · non-contrast
Comparison: Previous exam(s).

CLINICAL DATA: Patient is post ultrasound-guided core needle biopsy
of a 5 mm intraductal mass over the 9 o'clock position of the left
retroareolar region.

EXAM:
DIAGNOSTIC left MAMMOGRAM POST ULTRASOUND BIOPSY

[L CC]
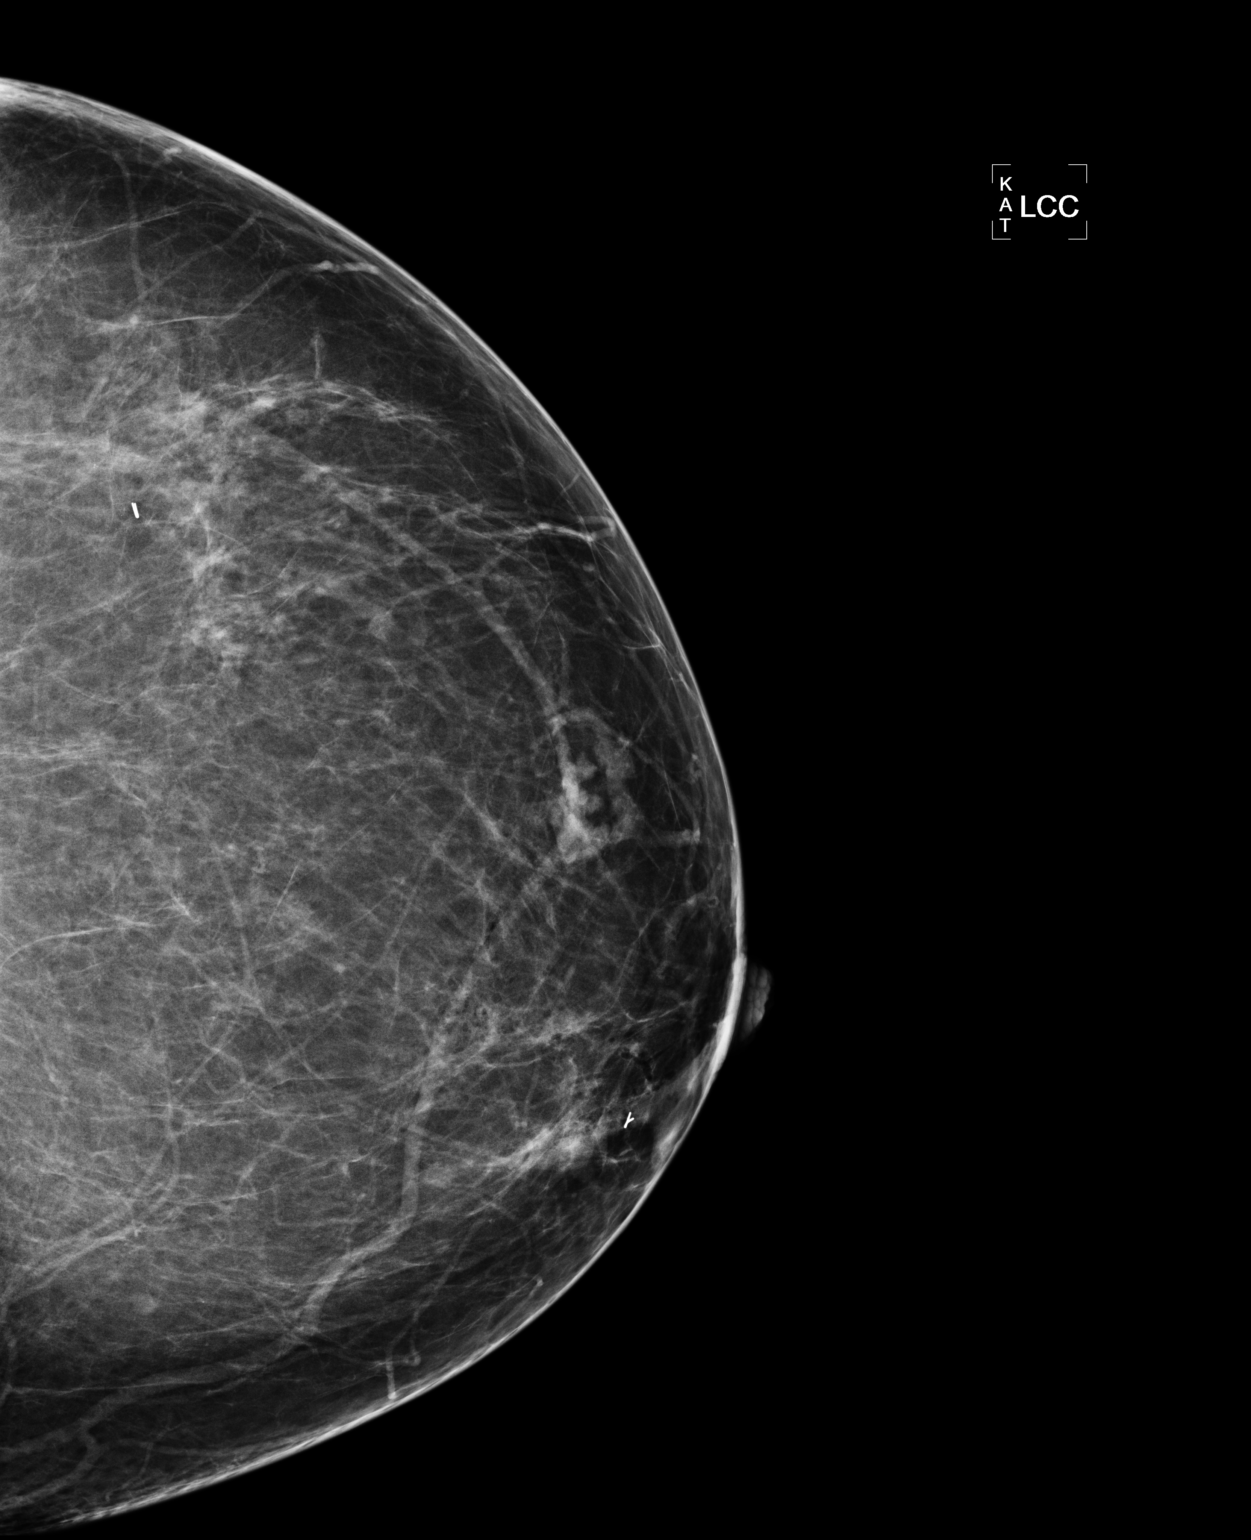

[L ML]
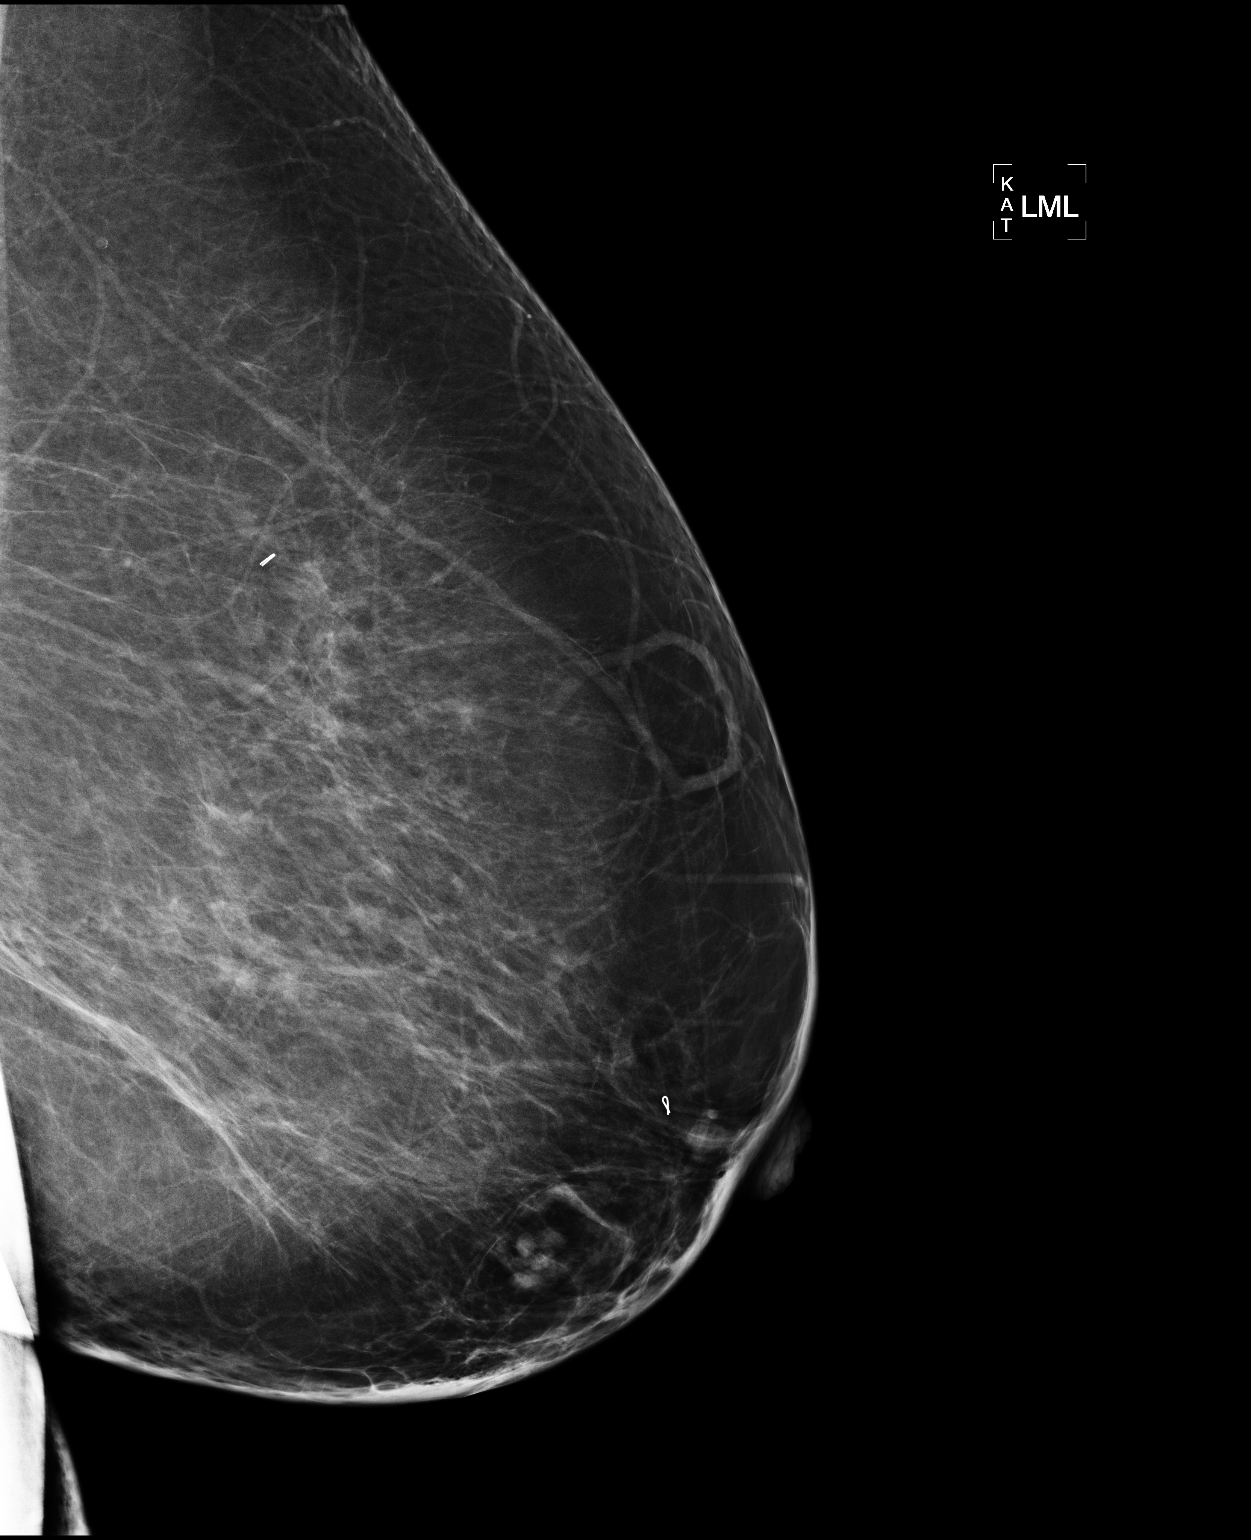

[2 of 2 positions shown; findings below may reference images not displayed]

FINDINGS: Mammographic images were obtained following ultrasound guided biopsy
of a 5 mm intraductal mass over the 9 o'clock position of the left
retroareolar region. Images demonstrate satisfactory position of a
ribbon shaped metallic clip over the biopsied mass at the 9 o'clock
position.
IMPRESSION: Satisfactory placement of a ribbon shaped metallic clip over the
biopsied mass at the 9 o'clock position of the retroareolar region.

Final Assessment: Post Procedure Mammograms for Marker Placement

## 2017-10-01 IMAGING — US US LT BREAST BX
1 series · 9 of 9 positions shown · non-contrast
Comparison: Previous exam(s).

ADDENDUM:
Pathology reveals Left breast intraductal papilloma at the 9 o'clock
position, 2 cmfn. This was found to be concordant by Dr. Nudle Felisilda.
Pathology results were discussed with the patient via telephone. The
patient reported tenderness at the biopsy site and is doing well
otherwise. Post biopsy care and instructions were reviewed and
questions were answered. The patient was encouraged to call The
[REDACTED] with any additional questions
and or concerns. A surgical referral was arranged with Dr. Chaba
Pedrito of [REDACTED] on April 29, 2015.

An Ultrasound guided biopsy of the Left breast at the 3 o'clock
location, 4 cmfn has been scheduled for [REDACTED] April 25, 2015.
The patient is aware of this appointment.
Pathology results reported by Beka Tatulashvili Titmeria RN on April 23, 2015.
CLINICAL DATA: Patient presents for ultrasound-guided core needle
biopsy of a 5 mm intraductal mass over the 9 o'clock position of the
left retroareolar region.
EXAM:
ULTRASOUND GUIDED LEFT BREAST CORE NEEDLE BIOPSY

[Series 1: us left breast bx · 0.07mm/px · 9 of 9 slices shown]
[im 1/9]
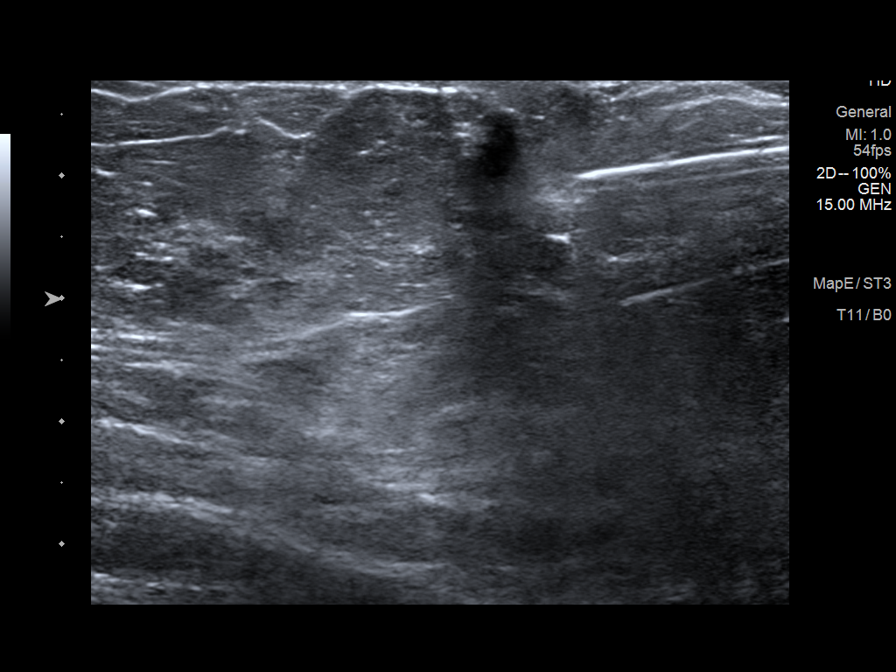
[im 2/9]
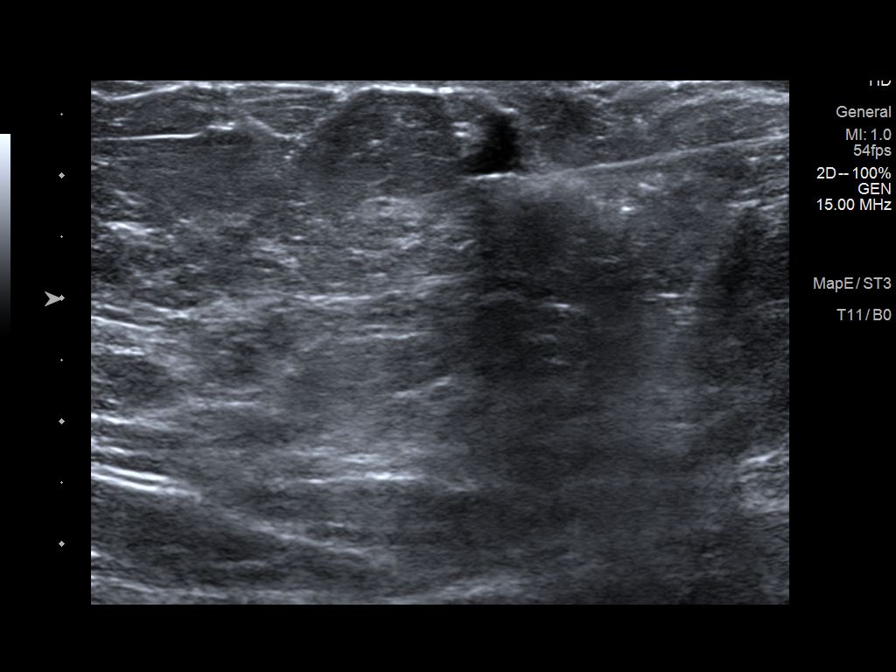
[im 3/9]
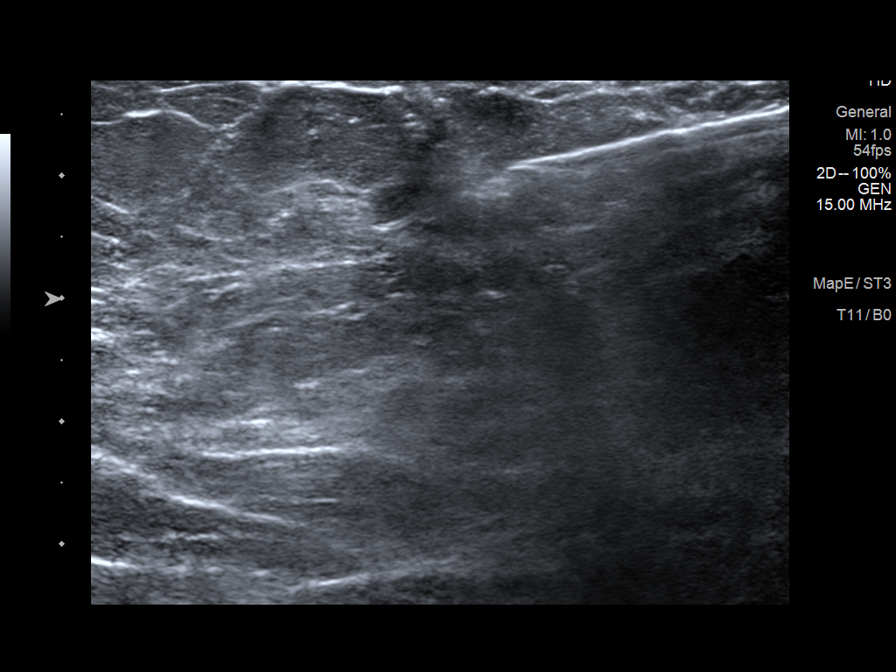
[im 4/9]
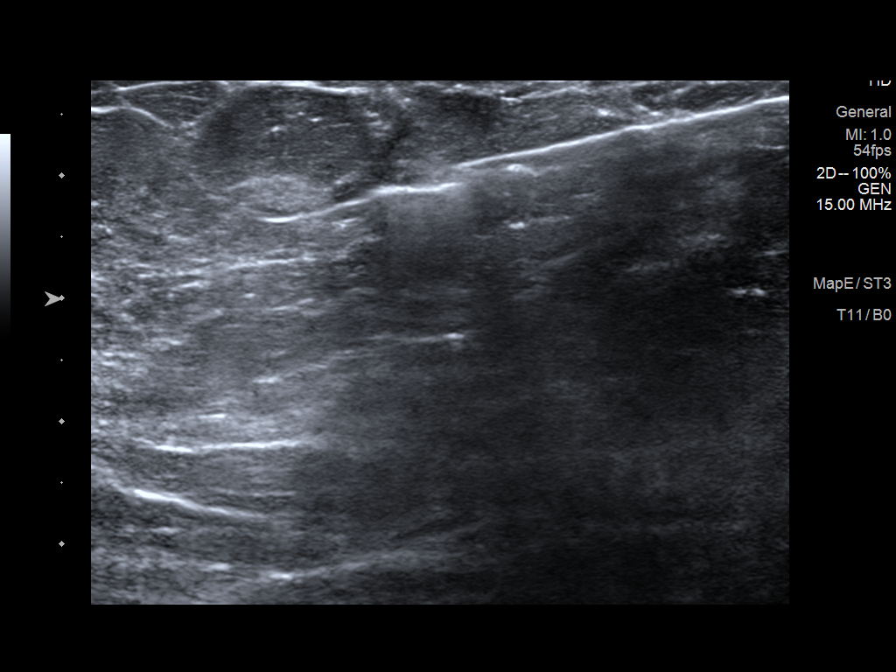
[im 5/9]
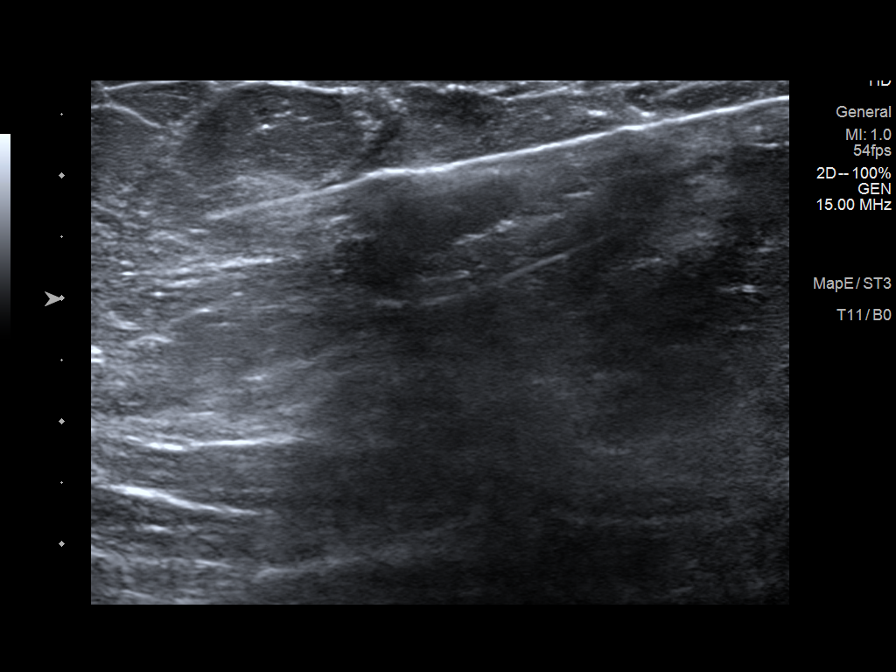
[im 6/9]
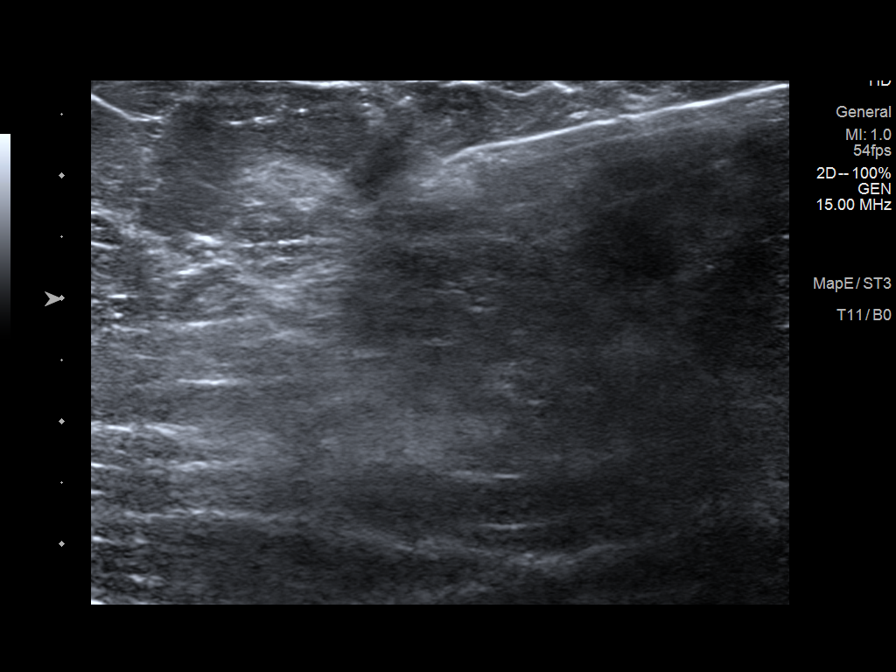
[im 7/9]
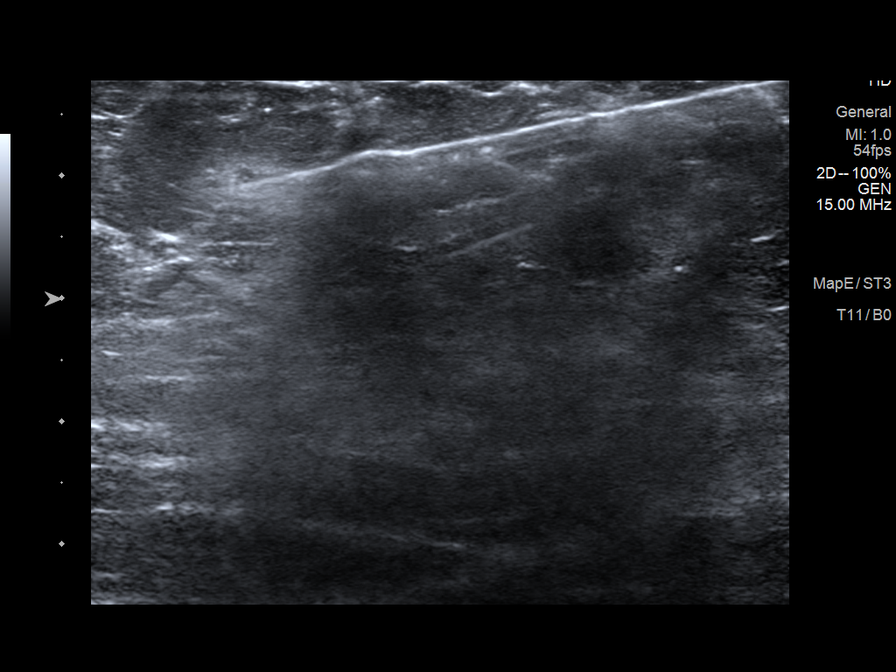
[im 8/9]
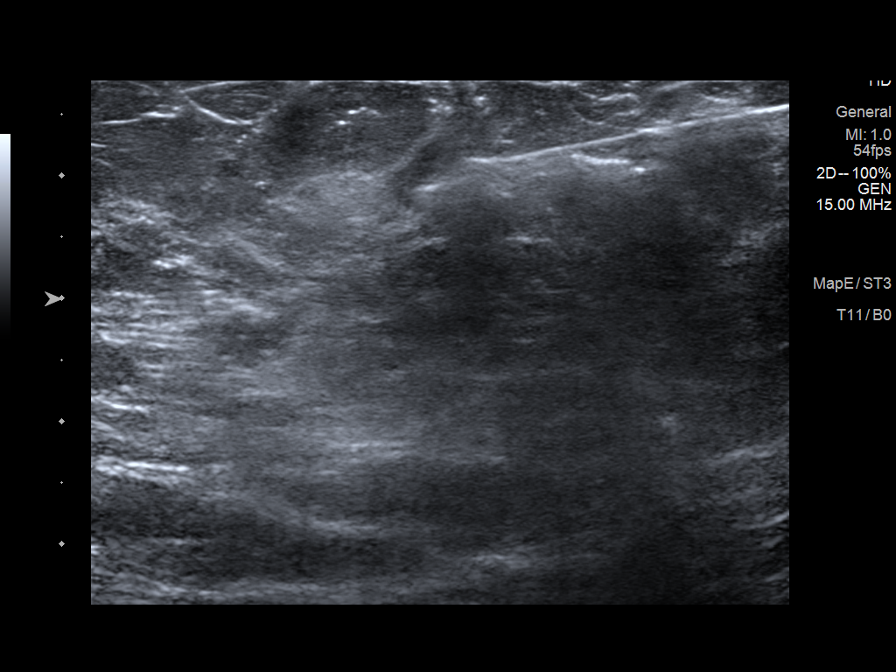
[im 9/9]
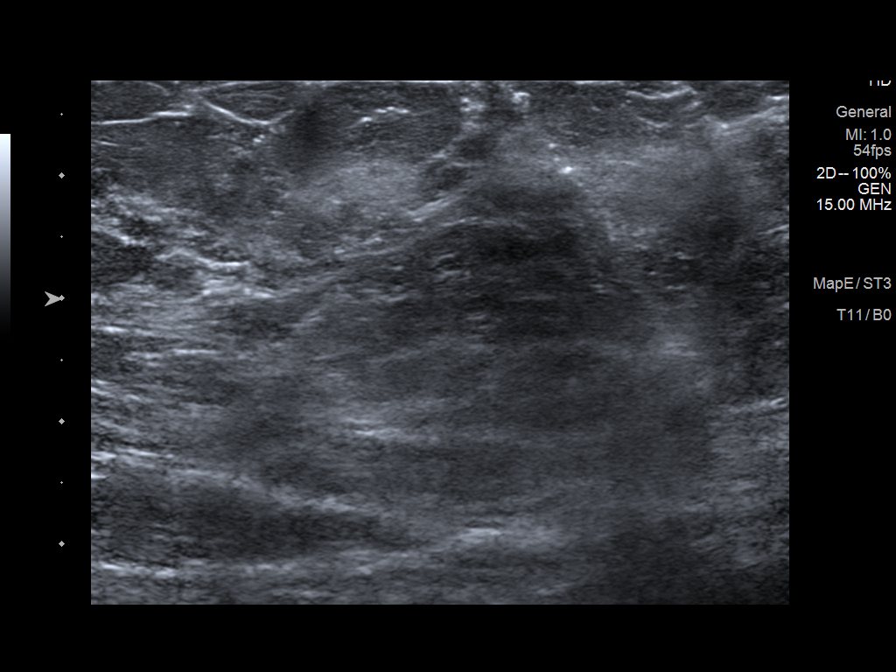

[9 of 9 positions shown; findings below may reference images not displayed]



Using sterile technique and 2% Lidocaine as local anesthetic, under
direct ultrasound visualization, a 14 gauge Dilermando device was
used to perform biopsy of the targeted mass at the 9 o'clock
position of the retroareolar region using a inferior to superior
approach. Three tissue specimens were obtained. At the conclusion of
the procedure a ribbon shaped tissue marker clip was deployed into
the biopsy cavity. Follow up 2 view mammogram was performed and
dictated separately.
IMPRESSION: Ultrasound guided biopsy of 5 mm intraductal mass at the 9 o'clock
position of the retroareolar region.. No apparent complications.

## 2017-10-04 IMAGING — US US LT BREAST BX
1 series · 9 of 9 positions shown · non-contrast
Comparison: Previous exam(s).

ADDENDUM:
Pathology reveals Left breast, lower outer quadrant, 4:00 o'clock,
papilloma with excision recommended. This was found to be concordant
by Dr. Boksas Tekorius. Pathology results were discussed with the patient
via telephone. The patient reported tenderness at the biopsy site
and is doing well otherwise. Post biopsy care and instructions were
reviewed and questions were answered. The patient was encouraged to
call The [REDACTED] with any additional
questions and or concerns. The patient has a recent diagnosis of
intraductal papilloma of the Left breast, at the 9:00 o'clock
location, 2 CMFN. She is scheduled to see Dr. Maurisjo Pysqyli of
[REDACTED] on April 29, 2015.

Pathology results reported by Eynard Conzales RN on April 26, 2015.
CLINICAL DATA: 54-year-old female presenting for ultrasound-guided
biopsy of a left breast mass. The patient recently had a biopsy in
the subareolar left breast demonstrating a benign papilloma.
EXAM:
ULTRASOUND GUIDED LEFT BREAST CORE NEEDLE BIOPSY

[Series 1: us left breast bx · 0.06mm/px · 9 of 9 slices shown]
[im 1/9]
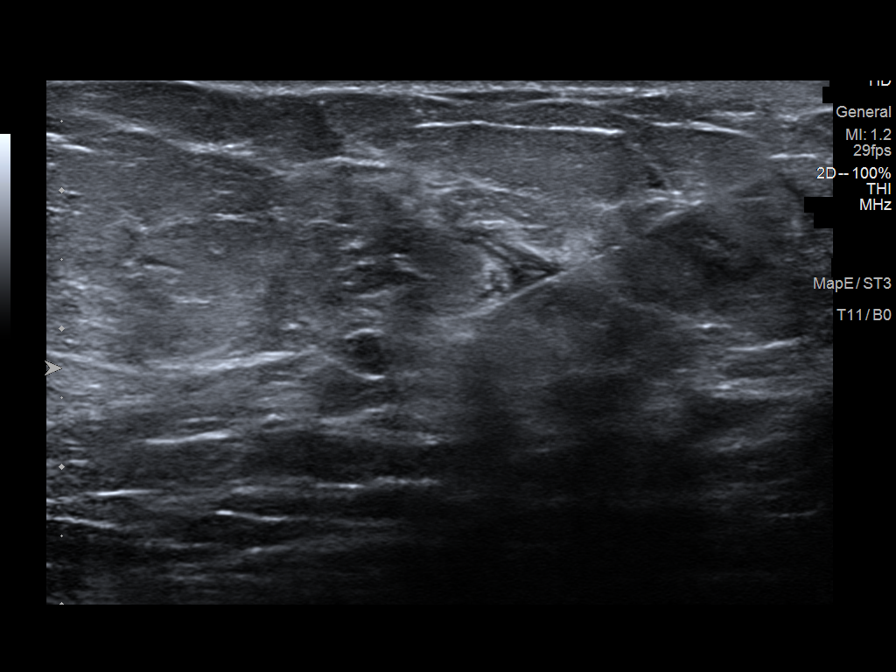
[im 2/9]
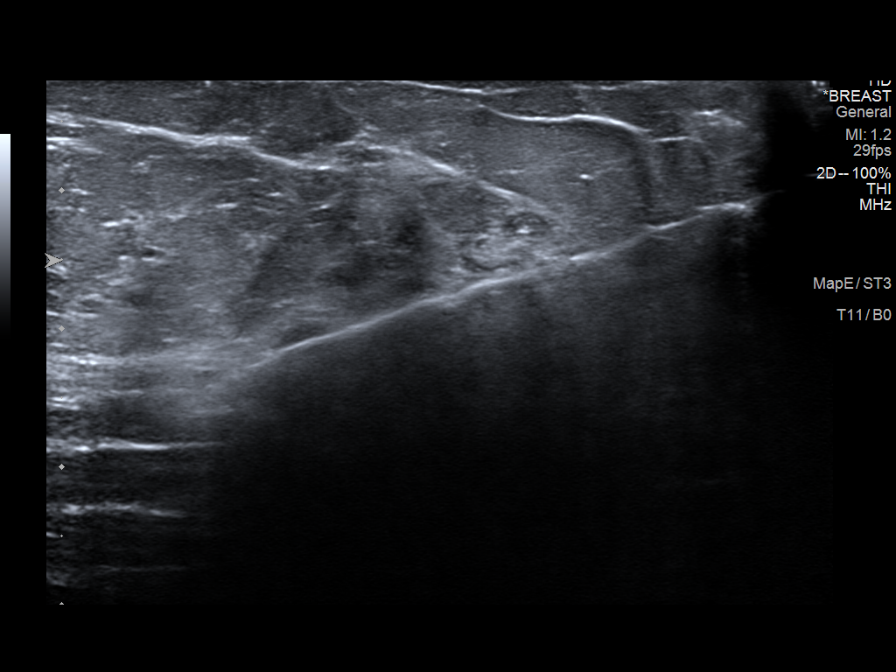
[im 3/9]
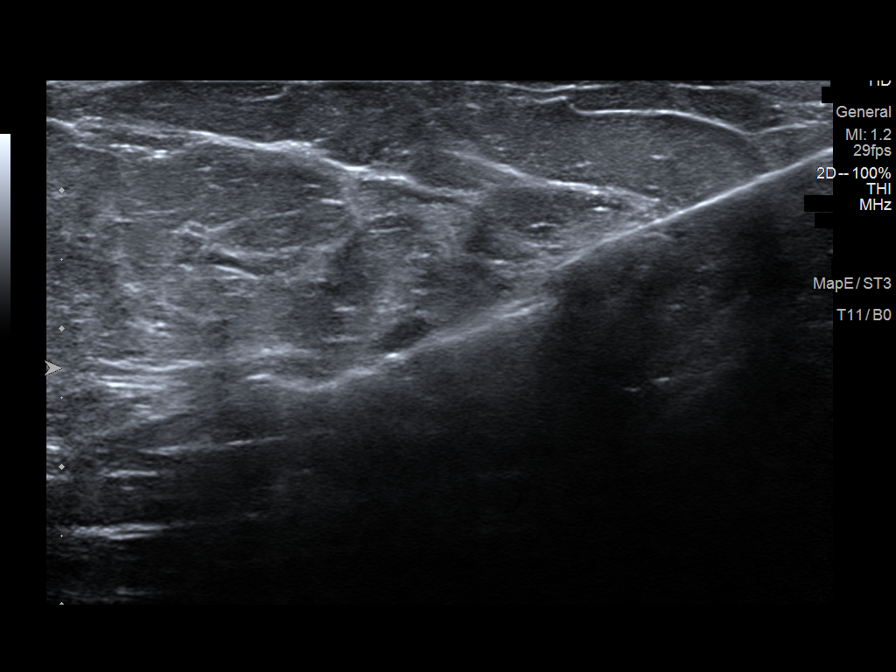
[im 4/9]
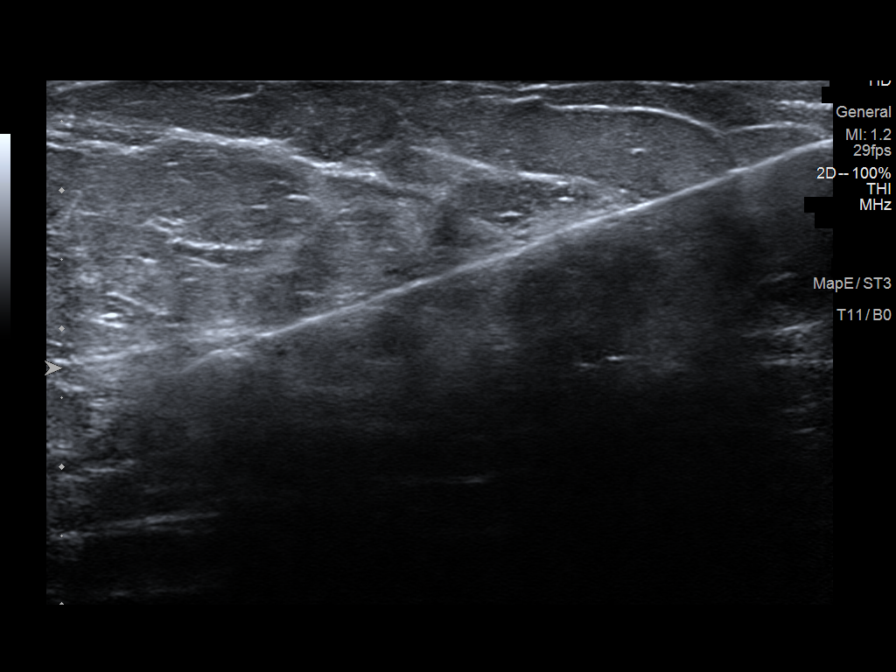
[im 5/9]
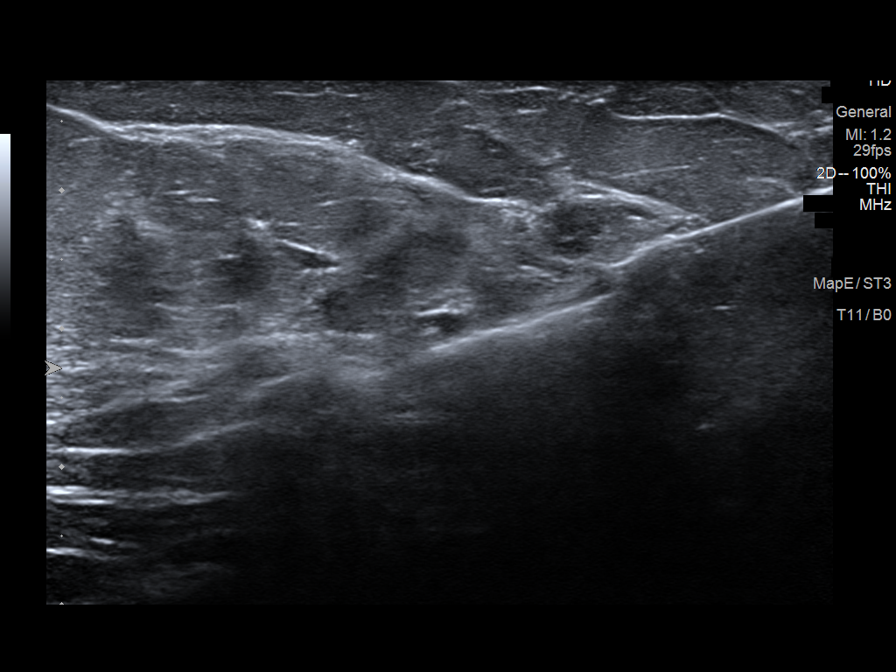
[im 6/9]
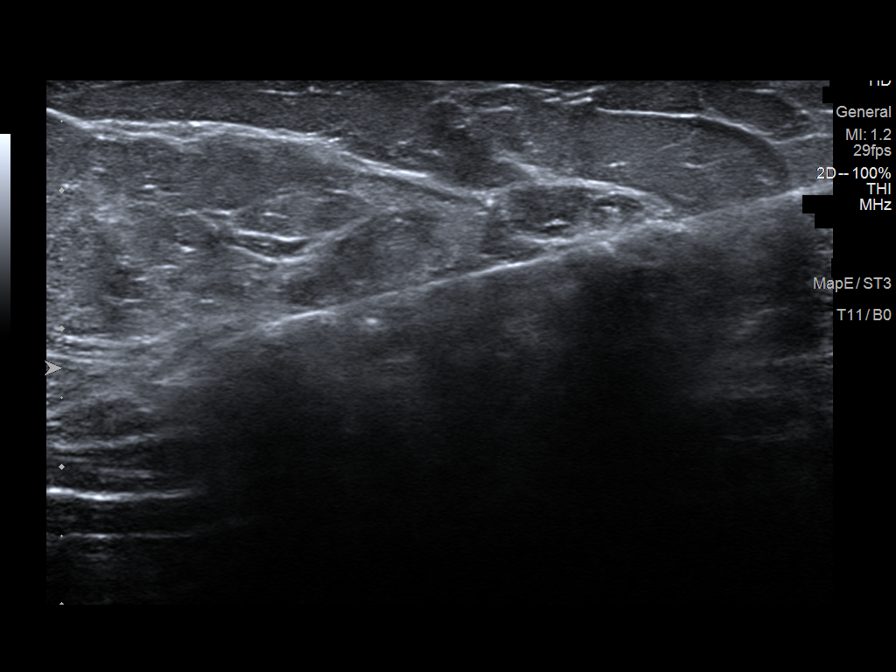
[im 7/9]
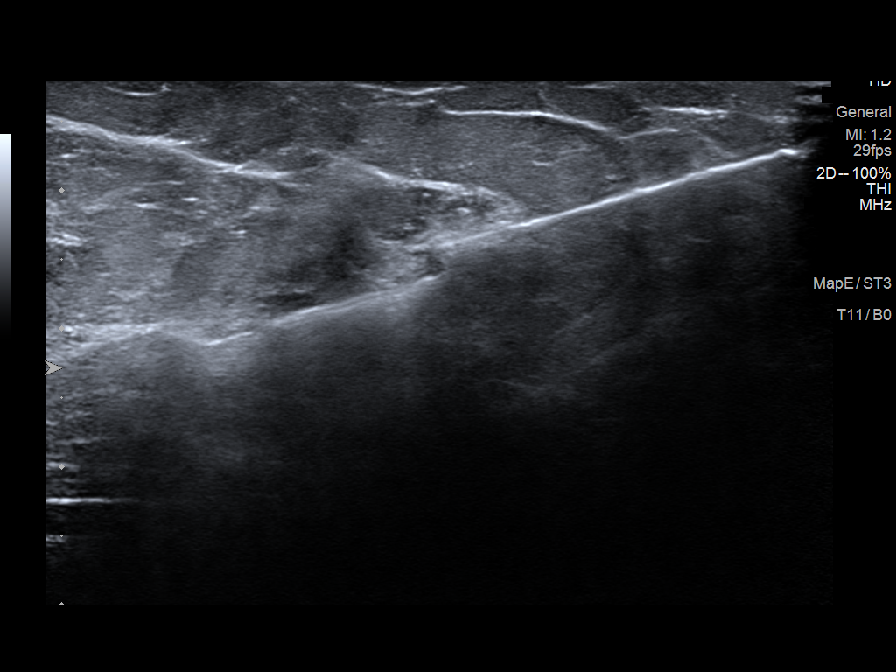
[im 8/9]
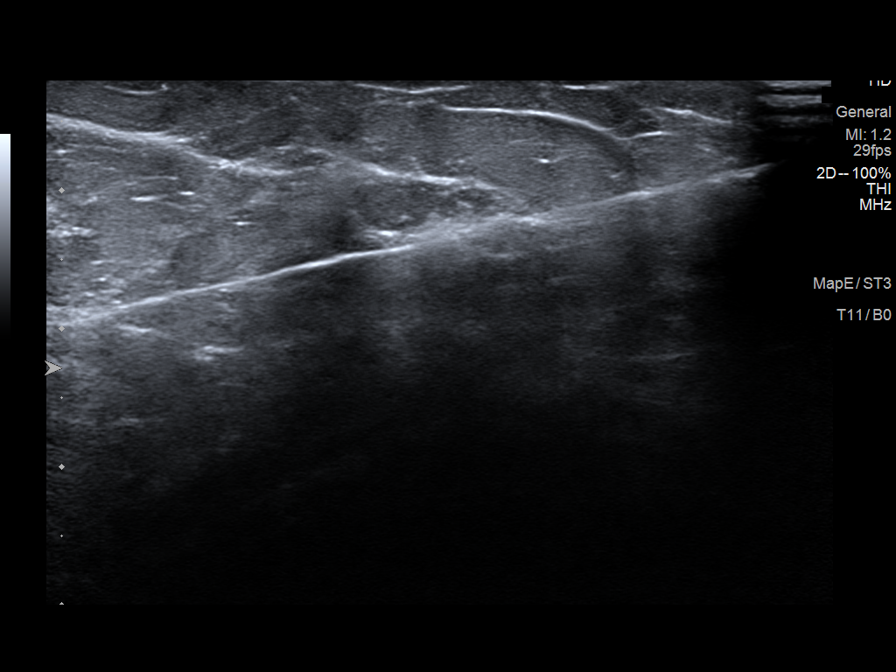
[im 9/9]
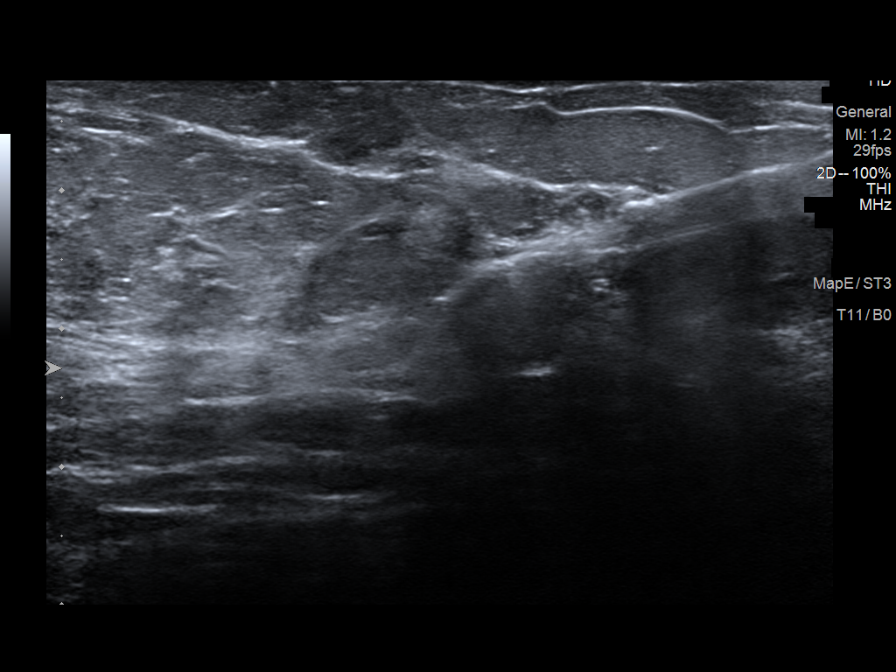

[9 of 9 positions shown; findings below may reference images not displayed]

PROCEDURE:
I met with the patient and we discussed the procedure of
ultrasound-guided biopsy, including benefits and alternatives. We
discussed the high likelihood of a successful procedure. We
discussed the risks of the procedure including infection, bleeding,
tissue injury, clip migration, and inadequate sampling. Informed
written consent was given. The usual time-out protocol was performed
immediately prior to the procedure.

Using sterile technique and 1% Lidocaine as local anesthetic, under
direct ultrasound visualization, a 12 gauge vacuum-assisted device
was used to perform biopsy of oval hypoechoic mass in the left
breast at 4 o'clockusing a inferior approach. At the conclusion of
the procedure, a coil shaped tissue marker clip was deployed into
the biopsy cavity. Follow-up 2-view mammogram was performed and
dictated separately.
IMPRESSION: Ultrasound-guided biopsy of small oval mass in the left breast 4
o'clock. No apparent complications.

## 2017-10-04 IMAGING — MG MM DIAGNOSTIC UNILATERAL L
2 series · 2 of 2 positions shown · non-contrast
Comparison: Previous exam(s).

CLINICAL DATA: Post biopsy mammogram of the left breast for clip
placement.

EXAM:
DIAGNOSTIC LEFT MAMMOGRAM POST ULTRASOUND BIOPSY

[L CC]
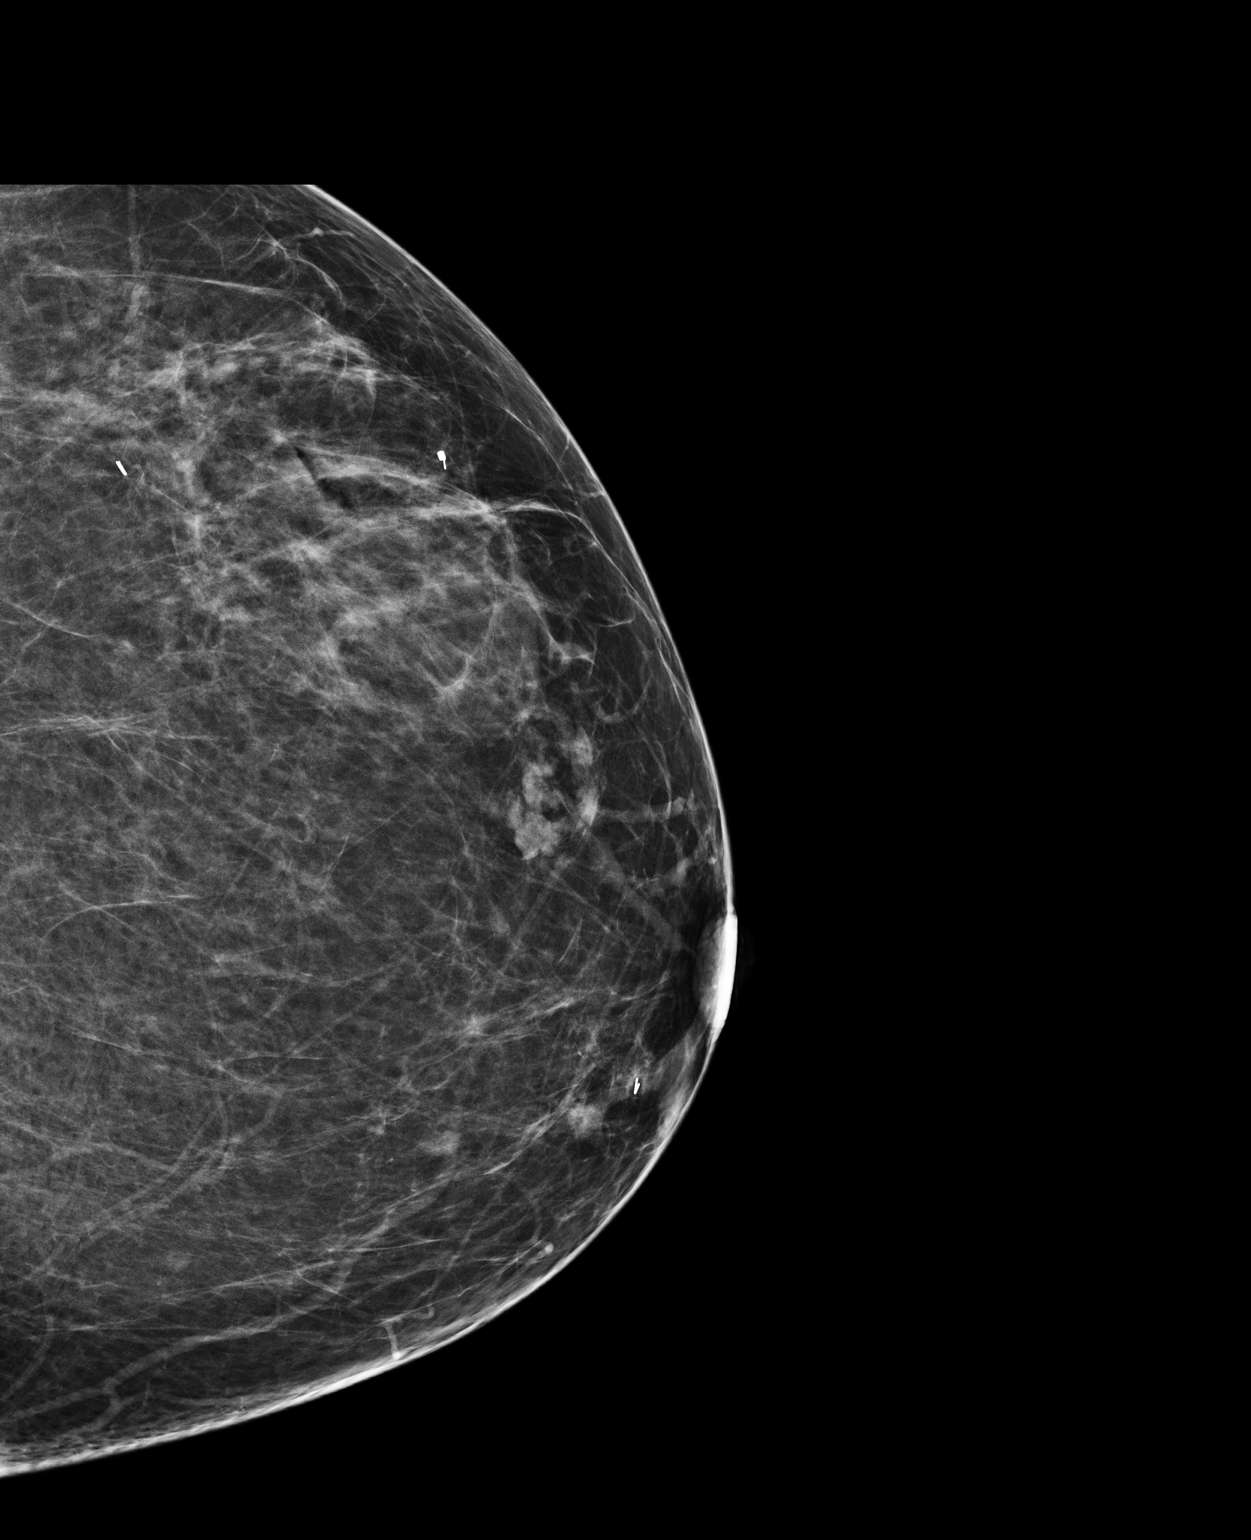

[L ML]
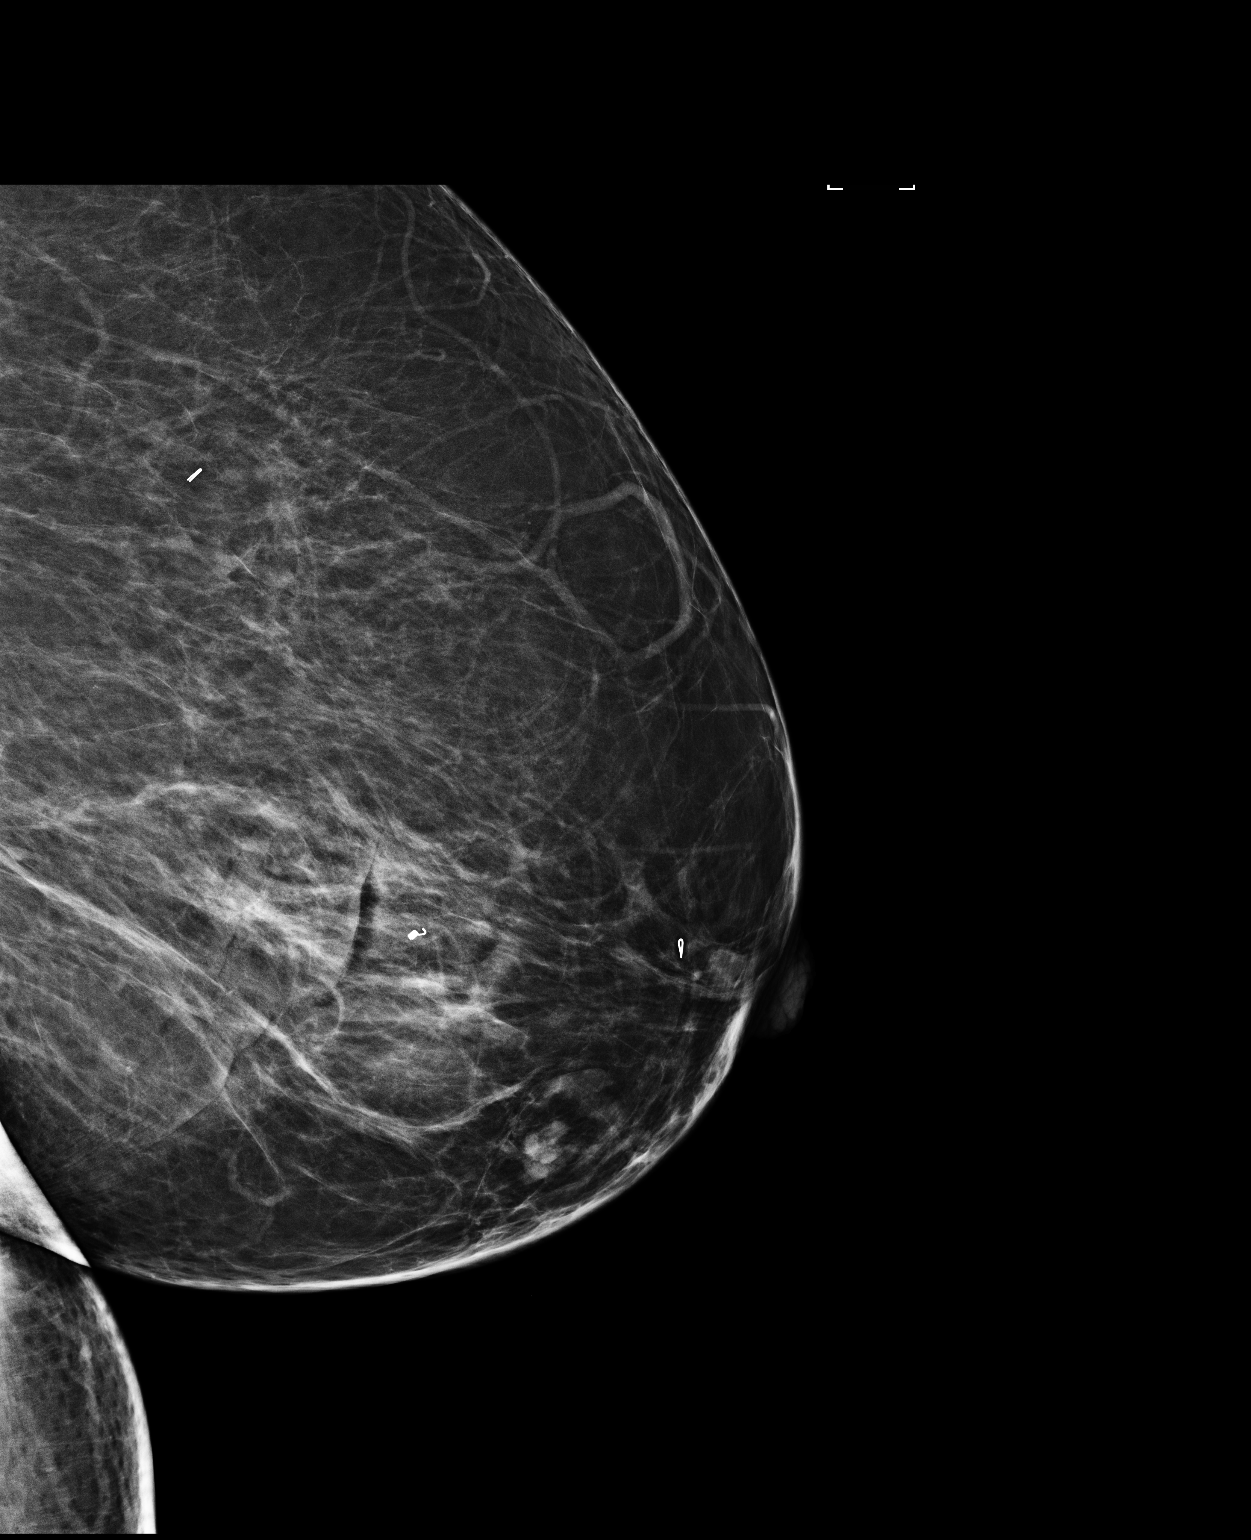

[2 of 2 positions shown; findings below may reference images not displayed]

FINDINGS: Mammographic images were obtained following ultrasound guided biopsy
of mass in the left breast at 4 o'clock. The coil shaped biopsy
marking clip is appropriately positioned at the intended site of
biopsy in the left breast.
IMPRESSION: Coil shaped biopsy marking clip is appropriately positioned at the
intended site of biopsy in the left breast at 4 o'clock.

Final Assessment: Post Procedure Mammograms for Marker Placement

## 2017-10-11 ENCOUNTER — Encounter: Payer: Self-pay | Admitting: Family Medicine

## 2017-10-11 ENCOUNTER — Ambulatory Visit (INDEPENDENT_AMBULATORY_CARE_PROVIDER_SITE_OTHER): Payer: Medicare Other | Admitting: Family Medicine

## 2017-10-11 VITALS — BP 140/80 | HR 102 | Temp 98.2°F | Ht 69.0 in | Wt 256.0 lb

## 2017-10-11 DIAGNOSIS — J4520 Mild intermittent asthma, uncomplicated: Secondary | ICD-10-CM

## 2017-10-11 DIAGNOSIS — Z7689 Persons encountering health services in other specified circumstances: Secondary | ICD-10-CM

## 2017-10-11 DIAGNOSIS — I10 Essential (primary) hypertension: Secondary | ICD-10-CM

## 2017-10-11 DIAGNOSIS — M1990 Unspecified osteoarthritis, unspecified site: Secondary | ICD-10-CM | POA: Diagnosis not present

## 2017-10-11 DIAGNOSIS — J302 Other seasonal allergic rhinitis: Secondary | ICD-10-CM | POA: Diagnosis not present

## 2017-10-11 NOTE — Patient Instructions (Addendum)
Chronic Back Pain When back pain lasts longer than 3 months, it is called chronic back pain.The cause of your back pain may not be known. Some common causes include:  Wear and tear (degenerative disease) of the bones, ligaments, or disks in your back.  Inflammation and stiffness in your back (arthritis).  People who have chronic back pain often go through certain periods in which the pain is more intense (flare-ups). Many people can learn to manage the pain with home care. Follow these instructions at home: Pay attention to any changes in your symptoms. Take these actions to help with your pain: Activity  Avoid bending and activities that make the problem worse.  Do not sit or stand in one place for long periods of time.  Take brief periods of rest throughout the day. This will reduce your pain. Resting in a lying or standing position is usually better than sitting to rest.  When you are resting for longer periods, mix in some mild activity or stretching between periods of rest. This will help to prevent stiffness and pain.  Get regular exercise. Ask your health care provider what activities are safe for you.  Do not lift anything that is heavier than 10 lb (4.5 kg). Always use proper lifting technique, which includes: ? Bending your knees. ? Keeping the load close to your body. ? Avoiding twisting. Managing pain  If directed, apply ice to the painful area. Your health care provider may recommend applying ice during the first 24-48 hours after a flare-up begins. ? Put ice in a plastic bag. ? Place a towel between your skin and the bag. ? Leave the ice on for 20 minutes, 2-3 times per day.  After icing, apply heat to the affected area as often as told by your health care provider. Use the heat source that your health care provider recommends, such as a moist heat pack or a heating pad. ? Place a towel between your skin and the heat source. ? Leave the heat on for 20-30  minutes. ? Remove the heat if your skin turns bright red. This is especially important if you are unable to feel pain, heat, or cold. You may have a greater risk of getting burned.  Try soaking in a warm tub.  Take over-the-counter and prescription medicines only as told by your health care provider.  Keep all follow-up visits as told by your health care provider. This is important. Contact a health care provider if:  You have pain that is not relieved with rest or medicine. Get help right away if:  You have weakness or numbness in one or both of your legs or feet.  You have trouble controlling your bladder or your bowels.  You have nausea or vomiting.  You have pain in your abdomen.  You have shortness of breath or you faint. This information is not intended to replace advice given to you by your health care provider. Make sure you discuss any questions you have with your health care provider. Document Released: 07/30/2004 Document Revised: 10/31/2015 Document Reviewed: 12/10/2014 Elsevier Interactive Patient Education  2018 [employer].   Preventing Hypertension Hypertension, commonly called high blood pressure, is when the force of blood pumping through the arteries is too strong. Arteries are blood vessels that carry blood from the heart throughout the body. Over time, hypertension can damage the arteries and decrease blood flow to important parts of the body, including the brain, heart, and kidneys. Often, hypertension does not cause symptoms  until blood pressure is very high. For this reason, it is important to have your blood pressure checked on a regular basis. Hypertension can often be prevented with diet and lifestyle changes. If you already have hypertension, you can control it with diet and lifestyle changes, as well as medicine. What nutrition changes can be made? Maintain a healthy diet. This includes:  Eating less salt (sodium). Ask your health care provider how much  sodium is safe for you to have. The general recommendation is to consume less than 1 tsp (2,300 mg) of sodium a day. ? Do not add salt to your food. ? Choose low-sodium options when grocery shopping and eating out.  Limiting fats in your diet. You can do this by eating low-fat or fat-free dairy products and by eating less red meat.  Eating more fruits, vegetables, and whole grains. Make a goal to eat: ? 1-2 cups of fresh fruits and vegetables each day. ? 3-4 servings of whole grains each day.  Avoiding foods and beverages that have added sugars.  Eating fish that contain healthy fats (omega-3 fatty acids), such as mackerel or salmon.  If you need help putting together a healthy eating plan, try the DASH diet. This diet is high in fruits, vegetables, and whole grains. It is low in sodium, red meat, and added sugars. DASH stands for Dietary Approaches to Stop Hypertension. What lifestyle changes can be made?  Lose weight if you are overweight. Losing just 3?5% of your body weight can help prevent or control hypertension. ? For example, if your present weight is 200 lb (91 kg), a loss of 3-5% of your weight means losing 6-10 lb (2.7-4.5 kg). ? Ask your health care provider to help you with a diet and exercise plan to safely lose weight.  Get enough exercise. Do at least 150 minutes of moderate-intensity exercise each week. ? You could do this in short exercise sessions several times a day, or you could do longer exercise sessions a few times a week. For example, you could take a brisk 10-minute walk or bike ride, 3 times a day, for 5 days a week.  Find ways to reduce stress, such as exercising, meditating, listening to music, or taking a yoga class. If you need help reducing stress, ask your health care provider.  Do not smoke. This includes e-cigarettes. Chemicals in tobacco and nicotine products raise your blood pressure each time you smoke. If you need help quitting, ask your health care  provider.  Avoid alcohol. If you drink alcohol, limit alcohol intake to no more than 1 drink a day for nonpregnant women and 2 drinks a day for men. One drink equals 12 oz of beer, 5 oz of wine, or 1 oz of hard liquor. Why are these changes important? Diet and lifestyle changes can help you prevent hypertension, and they may make you feel better overall and improve your quality of life. If you have hypertension, making these changes will help you control it and help prevent major complications, such as:  Hardening and narrowing of arteries that supply blood to: ? Your heart. This can cause a heart attack. ? Your brain. This can cause a stroke. ? Your kidneys. This can cause kidney failure.  Stress on your heart muscle, which can cause heart failure.  What can I do to lower my risk?  Work with your health care provider to make a hypertension prevention plan that works for you. Follow your plan and keep all follow-up visits  as told by your health care provider.  Learn how to check your blood pressure at home. Make sure that you know your personal target blood pressure, as told by your health care provider. How is this treated? In addition to diet and lifestyle changes, your health care provider may recommend medicines to help lower your blood pressure. You may need to try a few different medicines to find what works best for you. You also may need to take more than one medicine. Take over-the-counter and prescription medicines only as told by your health care provider. Where to find support: Your health care provider can help you prevent hypertension and help you keep your blood pressure at a healthy level. Your local hospital or your community may also provide support services and prevention programs. The American Heart Association offers an online support network at: CheapBootlegs.com.cy Where to find more information: Learn more about hypertension  from:  National Heart, Lung, and Blood Institute: ElectronicHangman.is  Centers for Disease Control and Prevention: https://ingram.com/  American Academy of Family Physicians: http://familydoctor.org/familydoctor/en/diseases-conditions/high-blood-pressure.printerview.all.html  Learn more about the DASH diet from:  [facility]: https://www.reyes.com/  Contact a health care provider if:  You think you are having a reaction to medicines you have taken.  You have recurrent headaches or feel dizzy.  You have swelling in your ankles.  You have trouble with your vision. Summary  Hypertension often does not cause any symptoms until blood pressure is very high. It is important to get your blood pressure checked regularly.  Diet and lifestyle changes are the most important steps in preventing hypertension.  By keeping your blood pressure in a healthy range, you can prevent complications like heart attack, heart failure, stroke, and kidney failure.  Work with your health care provider to make a hypertension prevention plan that works for you. This information is not intended to replace advice given to you by your health care provider. Make sure you discuss any questions you have with your health care provider. Document Released: 07/07/2015 Document Revised: 03/02/2016 Document Reviewed: 03/02/2016 Elsevier Interactive Patient Education  [employer].

## 2017-10-11 NOTE — Progress Notes (Signed)
Patient presents to clinic today to establish care.  SUBJECTIVE: PMH: Pt is a 57 yo female with pmh sig for HTN, asthma, arthritis, allergies.  Pt was previously seen by Ricke Hey, MD.  Pt is requesting an MRI of her brain.  Pt states in the past she was told she had memory issues may be dementia and needs to have her brain scanned.  Pt does not have previous records with her at this time.  Schizophrenia/anxiety: -followed by psychiatry, [facility].  -taking Xanax 0.5 mg 3 times daily as needed, fluoxetine 20 mg, Lamictal 75 mg twice daily, trazodone 150 mg nightly, trifluoperazine 2 mg nightly  HTN: -pt taking toprol xl 50 mg  -Patient states she checks her BP at home. -Patient endorses her BP being elevated when she is in pain.  BP was 190/100 a few days ago. -Patient denies headaches, blurred vision, chest pain. -Patient states she eats mostly vegetables and drinks plenty of water daily. -Patient states she is unable to exercise 2/2 back pain  Asthma: -Patient notices symptoms with change in the weather -Patient takes albuterol and Advair inhaler  Arthritis: -Causing chronic pain -Patient states her back, shoulders, wrists are affected. -Patient states she is followed by pain clinic but cannot recall the name. -States she last saw them in February. -Patient is taking hydrocodone-acetaminophen 10-325 mg 3 times daily as needed  Seasonal allergies: -Patient followed by [facility] -States she receives allergy shots -States was given Rx for Zyrtec but is not taking  Allergies: Patient has multiple allergies causing anaphylaxis including B venom, latex, peanuts, penicillin, Seroquel, shellfish. Depakote-S OB, rash Amitriptyline-hallucinations Butorphanol-hallucinations Gabapentin-severe back pain and side pain Iodine-flushing and thinking  Past surgical history: Cholecystectomy in 1999 Breast biopsy 2017 Appendectomy 2005 Hysterectomy 2/2  fibroids 1999 C-section 1984  Social history: Patient is not currently working.  She is divorced.  She has 1 child.  Patient denies tobacco, alcohol, drug use.  Health Maintenance: Colonoscopy --2010 Mammogram --2017 PAP --2017  Past Medical History:  Diagnosis Date  . Anemia   . Anxiety state, unspecified   . Arthritis   . Arthropathy, unspecified, site unspecified   . Asthma   . Backache, unspecified   . Bipolar disorder ([place])   . Depressive disorder, not elsewhere classified   . Esophageal reflux   . Fibromyalgia   . Hypertension   . MS (multiple sclerosis) ([place])    remission at present(past hx. bedridden) -ambulates with cane-left side weaker  . Obstructive sleep apnea (adult) (pediatric)    no cpap-uses Oxygen nightly 2l/m   . On home oxygen therapy 05-19-13   nightly at 2l/m  . Other abnormal glucose    05-19-13 no problems now  . Seizures ([place])    last seizure 2014    Past Surgical History:  Procedure Laterality Date  . APPENDECTOMY    . BREAST LUMPECTOMY WITH NEEDLE LOCALIZATION Left 06/06/2015   Procedure: LEFT BREAST LUMPECTOMY WITH NEEDLE LOCALIZATION TIMES TWO;  Surgeon: Erroll Luna, MD;  Location: [facility];  Service: General;  Laterality: Left;  . BREAST SURGERY    . CESAREAN SECTION  1988   twins  . CHOLECYSTECTOMY    . COLONOSCOPY N/A 06/12/2013   Procedure: COLONOSCOPY;  Surgeon: Irene Shipper, MD;  Location[facility];  Service: Endoscopy;  Laterality: N/A;  . HERNIA REPAIR     umbilical  . TUBAL LIGATION    . VAGINAL HYSTERECTOMY      Current Outpatient  Medications on File Prior to Visit  Medication Sig Dispense Refill  . ADVAIR DISKUS 500-50 MCG/DOSE AEPB USE ONE INHALATION BY MOUTH EVERY 12 HOURS FOR ASTHMA 60 each 5  . ALPRAZolam (XANAX) 0.5 MG tablet Take 1 tablet by mouth 3 (three) times daily as needed for anxiety or sleep.     Marland Kitchen EPINEPHrine (EPI-PEN) 0.3 mg/0.3 mL DEVI Inject 0.3 mg into the muscle once. As needed  for severe allergic reaction    . famotidine (PEPCID) 20 MG tablet Take 1 tablet (20 mg total) by mouth 2 (two) times daily. For stomach acid. 60 tablet 0  . FLUoxetine (PROZAC) 20 MG capsule Take 20 mg by mouth daily.    Marland Kitchen HYDROcodone-acetaminophen (NORCO) 10-325 MG tablet Take 1 tablet by mouth 3 (three) times daily as needed for moderate pain.    Marland Kitchen lamoTRIgine (LAMICTAL) 25 MG tablet Take 75 mg by mouth 2 (two) times daily.    Marland Kitchen levETIRAcetam (KEPPRA) 500 MG tablet Take 1 tablet (500 mg total) by mouth 2 (two) times daily. 60 tablet 2  . metoprolol succinate (TOPROL-XL) 50 MG 24 hr tablet Take 50 mg by mouth at bedtime. Take with or immediately following a meal for blood pressure control.    . Multiple Vitamin (MULTIVITAMIN WITH MINERALS) TABS Take 1 tablet by mouth daily. For nutritional supplementation. 30 tablet 0  . polyethylene glycol (MIRALAX / GLYCOLAX) packet Take 17 g by mouth daily as needed for mild constipation. 14 each 0  . PROAIR HFA 108 (90 BASE) MCG/ACT inhaler INHALE 2 PUFFS BY MOUTH EVERY 4 HOURS AS NEEDED FOR WHEEZING OR SHORTNESS OF BREATH 8.5 g 5  . traZODone (DESYREL) 150 MG tablet Take 150 mg by mouth at bedtime.    Marland Kitchen trifluoperazine (STELAZINE) 2 MG tablet Take 2 mg by mouth at bedtime.     No current facility-administered medications on file prior to visit.     Allergies  Allergen Reactions  . Bee Venom Anaphylaxis  . Depakote [Divalproex Sodium] Shortness Of Breath and Rash  . Latex Anaphylaxis  . Peanut-Containing Drug Products Anaphylaxis  . Penicillins Anaphylaxis  . Seroquel [Quetiapine] Anaphylaxis  . Shellfish-Derived Products Anaphylaxis  . Amitriptyline Hcl Other (See Comments)    REACTION: HALLUCINATIONS  . Butorphanol Tartrate Other (See Comments)    REACTION: Hallucinations  . Gabapentin Other (See Comments)    Pt reports severe back and side pain  . Iodine     REACTION: Flushing and fainting    Family History  Problem Relation Age of Onset    . Heart disease Father   . Ovarian cancer Mother   . Colon cancer Neg Hx   . Esophageal cancer Neg Hx   . Stomach cancer Neg Hx   . Rectal cancer Neg Hx   . Breast cancer Neg Hx     Social History   Socioeconomic History  . Marital status: Divorced    Spouse name: Not on file  . Number of children: 3  . Years of education: 12+  . Highest education level: Not on file  Occupational History  . Occupation: Disabled  Social Needs  . Financial resource strain: Not on file  . Food insecurity:    Worry: Not on file    Inability: Not on file  . Transportation needs:    Medical: Not on file    Non-medical: Not on file  Tobacco Use  . Smoking status: Former Smoker    Packs/day: 1.00    Years: 5.00  Pack years: 5.00    Last attempt to quit: 07/07/2007    Years since quitting: 10.2  . Smokeless tobacco: Never Used  Substance and Sexual Activity  . Alcohol use: No  . Drug use: No  . Sexual activity: Not Currently    Birth control/protection: Surgical    Comment: X 1 year  Lifestyle  . Physical activity:    Days per week: Not on file    Minutes per session: Not on file  . Stress: Not on file  Relationships  . Social connections:    Talks on phone: Not on file    Gets together: Not on file    Attends religious service: Not on file    Active member of club or organization: Not on file    Attends meetings of clubs or organizations: Not on file    Relationship status: Not on file  . Intimate partner violence:    Fear of current or ex partner: Not on file    Emotionally abused: Not on file    Physically abused: Not on file    Forced sexual activity: Not on file  Other Topics Concern  . Not on file  Social History Narrative   Single, 3 children   Right handed   Associates degree   occ 1 cup daily    ROS General: Denies fever, chills, night sweats, changes in weight, changes in appetite HEENT: Denies headaches, ear pain, changes in vision, rhinorrhea, sore  throat CV: Denies CP, palpitations, SOB, orthopnea Pulm: Denies SOB, cough, wheezing GI: Denies abdominal pain, nausea, vomiting, diarrhea, constipation GU: Denies dysuria, hematuria, frequency, vaginal discharge Msk: Denies muscle cramps   + joint pains Neuro: Denies weakness, numbness, tingling Skin: Denies rashes, bruising Psych: Denies depression, anxiety, hallucinations  BP 140/80 (BP Location: Left Leg, Patient Position: Sitting, Cuff Size: Large)   Pulse (!) 102   Temp 98.2 F (36.8 C) (Oral)   Ht 5\' 9"  (1.753 m)   Wt 256 lb (116.1 kg)   SpO2 96%   BMI 37.80 kg/m   Physical Exam Gen. Pleasant, well developed, well-nourished, in NAD HEENT - [place]/AT, PERRL, no scleral icterus, no nasal drainage, pharynx without erythema or exudate.  TMs normal bilaterally.  No cervical lymphadenopathy. Lungs: no use of accessory muscles, CTAB, no wheezes, rales or rhonchi Cardiovascular: RRR,  No r/g/m, no peripheral edema Abdomen: BS present, soft, nontender, nondistended Neuro:  A&Ox3, CN II-XII intact, normal gait Psych: normal affect, pt seems distant at times, easily looses focus/train of thought  No results found for this or any previous visit (from the past 2160 hour(s)).  Assessment/Plan: Mild intermittent asthma without complication -continue Albuterol prn and Advair daily. -continue f/u with Asthma and Allergy  Encounter to establish care -We reviewed the PMH, PSH, FH, SH, Meds and Allergies. -We provided refills for any medications we will prescribe as needed. -We addressed current concerns per orders and patient instructions. -We have asked for records for pertinent exams, studies, vaccines and notes from previous providers. -We have advised patient to follow up per instructions below.  Arthritis -continue f/u with pain management for norco  Seasonal allergies -continue f/u with asthma and allergy -keep Epi pen with you  Essential hypertension -pt encouraged to  continue checking BP at home and keep a log to bring up you to clinic. -Continue to limit sodium intake and increase physical activity -Continue Toprol-XL 25 mg daily  Follow-up PRN.  We will try to obtain records from previous provider in  regards to needing MRI.  Grier Mitts, MD

## 2017-10-19 ENCOUNTER — Other Ambulatory Visit: Payer: Self-pay | Admitting: Family Medicine

## 2017-10-19 NOTE — Telephone Encounter (Signed)
Last OV 10/11/17  You have never filled this medication for this patient before, please advise.

## 2017-10-21 ENCOUNTER — Other Ambulatory Visit: Payer: Self-pay | Admitting: Family Medicine

## 2017-10-21 NOTE — Telephone Encounter (Signed)
This medication is not listed in pt's chart... Ok to refill? Please advise.

## 2017-11-08 DIAGNOSIS — N6452 Nipple discharge: Secondary | ICD-10-CM | POA: Diagnosis not present

## 2017-11-09 DIAGNOSIS — M533 Sacrococcygeal disorders, not elsewhere classified: Secondary | ICD-10-CM | POA: Diagnosis not present

## 2017-11-09 DIAGNOSIS — M7918 Myalgia, other site: Secondary | ICD-10-CM | POA: Diagnosis not present

## 2017-11-09 DIAGNOSIS — Z9189 Other specified personal risk factors, not elsewhere classified: Secondary | ICD-10-CM | POA: Diagnosis not present

## 2017-11-09 DIAGNOSIS — G894 Chronic pain syndrome: Secondary | ICD-10-CM | POA: Diagnosis not present

## 2017-11-09 DIAGNOSIS — M5441 Lumbago with sciatica, right side: Secondary | ICD-10-CM | POA: Diagnosis not present

## 2017-11-09 DIAGNOSIS — G8929 Other chronic pain: Secondary | ICD-10-CM | POA: Diagnosis not present

## 2017-11-10 DIAGNOSIS — F250 Schizoaffective disorder, bipolar type: Secondary | ICD-10-CM | POA: Diagnosis not present

## 2017-11-11 ENCOUNTER — Other Ambulatory Visit: Payer: Self-pay | Admitting: Surgery

## 2017-11-11 DIAGNOSIS — N6452 Nipple discharge: Secondary | ICD-10-CM

## 2017-11-22 ENCOUNTER — Telehealth: Payer: Self-pay | Admitting: Family Medicine

## 2017-11-22 MED ORDER — METOPROLOL SUCCINATE ER 50 MG PO TB24: 50.0000 mg | ORAL_TABLET | Freq: Every day | ORAL | 1 refills | Status: DC

## 2017-11-22 NOTE — Telephone Encounter (Signed)
Medication filled to pharmacy as requested.   

## 2017-11-22 NOTE — Telephone Encounter (Signed)
Copied from [place] [phone] [phone]. Topic: Quick Communication - Rx Refill/Question >> Nov 22, 2017  2:18 PM Margot Ables wrote: Medication: metoprolol 50mg  - takes 1/day - pt is out - had from former provider Has the patient contacted their pharmacy? Yes - change to Dr. Volanda Napoleon recently Preferred Pharmacy (with phone number or street name): [facility] - [place], [place] [phone] (Phone) [phone] (Fax)

## 2017-11-22 NOTE — Telephone Encounter (Signed)
The correct dose is toprol xl 50 mg daily.  Its ok to refill it. 90 pills 1 refill.

## 2017-11-22 NOTE — Telephone Encounter (Signed)
Please advise. Confirmed with pharmacy that patient has been taking 50 mg daily, but last office note says to continue 25 mg daily. What is correct dose?

## 2017-12-04 ENCOUNTER — Ambulatory Visit
Admission: RE | Admit: 2017-12-04 | Discharge: 2017-12-04 | Disposition: A | Discharge status: Home / Self Care | Payer: Medicare Other | Source: Ambulatory Visit | Attending: Surgery | Admitting: Surgery

## 2017-12-04 DIAGNOSIS — N6452 Nipple discharge: Secondary | ICD-10-CM

## 2017-12-04 MED ORDER — GADOBENATE DIMEGLUMINE 529 MG/ML IV SOLN
20.0000 mL | Freq: Once | INTRAVENOUS | Status: AC | PRN
  Administered 2017-12-04: 20 mL via INTRAVENOUS

## 2018-01-09 ENCOUNTER — Other Ambulatory Visit: Payer: Self-pay

## 2018-01-09 ENCOUNTER — Emergency Department (HOSPITAL_COMMUNITY)
Admission: EM | Admit: 2018-01-09 | Discharge: 2018-01-09 | Disposition: A | Discharge status: Home / Self Care | Payer: Medicare Other | Attending: Emergency Medicine | Admitting: Emergency Medicine

## 2018-01-09 ENCOUNTER — Encounter (HOSPITAL_COMMUNITY): Payer: Self-pay | Admitting: Emergency Medicine

## 2018-01-09 ENCOUNTER — Emergency Department (HOSPITAL_COMMUNITY): Payer: Medicare Other

## 2018-01-09 DIAGNOSIS — Y92002 Bathroom of unspecified non-institutional (private) residence single-family (private) house as the place of occurrence of the external cause: Secondary | ICD-10-CM | POA: Diagnosis not present

## 2018-01-09 DIAGNOSIS — G35 Multiple sclerosis: Secondary | ICD-10-CM | POA: Diagnosis not present

## 2018-01-09 DIAGNOSIS — J45909 Unspecified asthma, uncomplicated: Secondary | ICD-10-CM | POA: Diagnosis not present

## 2018-01-09 DIAGNOSIS — Z91010 Allergy to peanuts: Secondary | ICD-10-CM | POA: Diagnosis not present

## 2018-01-09 DIAGNOSIS — R55 Syncope and collapse: Secondary | ICD-10-CM | POA: Diagnosis not present

## 2018-01-09 DIAGNOSIS — R51 Headache: Secondary | ICD-10-CM | POA: Diagnosis not present

## 2018-01-09 DIAGNOSIS — S0990XA Unspecified injury of head, initial encounter: Secondary | ICD-10-CM | POA: Diagnosis not present

## 2018-01-09 DIAGNOSIS — W182XXA Fall in (into) shower or empty bathtub, initial encounter: Secondary | ICD-10-CM | POA: Insufficient documentation

## 2018-01-09 DIAGNOSIS — M542 Cervicalgia: Secondary | ICD-10-CM | POA: Diagnosis not present

## 2018-01-09 DIAGNOSIS — Z87891 Personal history of nicotine dependence: Secondary | ICD-10-CM | POA: Diagnosis not present

## 2018-01-09 DIAGNOSIS — Y9389 Activity, other specified: Secondary | ICD-10-CM | POA: Diagnosis not present

## 2018-01-09 DIAGNOSIS — S20219A Contusion of unspecified front wall of thorax, initial encounter: Secondary | ICD-10-CM | POA: Insufficient documentation

## 2018-01-09 DIAGNOSIS — I10 Essential (primary) hypertension: Secondary | ICD-10-CM | POA: Insufficient documentation

## 2018-01-09 DIAGNOSIS — Z79899 Other long term (current) drug therapy: Secondary | ICD-10-CM | POA: Diagnosis not present

## 2018-01-09 DIAGNOSIS — Y998 Other external cause status: Secondary | ICD-10-CM | POA: Diagnosis not present

## 2018-01-09 DIAGNOSIS — S0093XA Contusion of unspecified part of head, initial encounter: Secondary | ICD-10-CM | POA: Insufficient documentation

## 2018-01-09 DIAGNOSIS — S299XXA Unspecified injury of thorax, initial encounter: Secondary | ICD-10-CM | POA: Diagnosis not present

## 2018-01-09 DIAGNOSIS — R42 Dizziness and giddiness: Secondary | ICD-10-CM | POA: Diagnosis not present

## 2018-01-09 DIAGNOSIS — Z91040 Latex allergy status: Secondary | ICD-10-CM | POA: Insufficient documentation

## 2018-01-09 DIAGNOSIS — S199XXA Unspecified injury of neck, initial encounter: Secondary | ICD-10-CM | POA: Diagnosis not present

## 2018-01-09 DIAGNOSIS — S0083XA Contusion of other part of head, initial encounter: Secondary | ICD-10-CM | POA: Diagnosis not present

## 2018-01-09 DIAGNOSIS — M546 Pain in thoracic spine: Secondary | ICD-10-CM | POA: Diagnosis not present

## 2018-01-09 LAB — BASIC METABOLIC PANEL
Anion gap: 7 (ref 5–15)
BUN: 15 mg/dL (ref 6–20)
CO2: 25 mmol/L (ref 22–32)
Calcium: 9 mg/dL (ref 8.9–10.3)
Chloride: 106 mmol/L (ref 98–111)
Creatinine, Ser: 1.07 mg/dL — ABNORMAL HIGH (ref 0.44–1.00)
GFR calc Af Amer: >60 mL/min (ref >60)
GFR calc non Af Amer: 57 mL/min — ABNORMAL LOW (ref >60)
Glucose, Bld: 103 mg/dL — ABNORMAL HIGH (ref 70–99)
Potassium: 4.2 mmol/L (ref 3.5–5.1)
Sodium: 138 mmol/L (ref 135–145)

## 2018-01-09 LAB — CBC
HCT: 42.9 % (ref 36.0–46.0)
Hemoglobin: 13.7 g/dL (ref 12.0–15.0)
MCH: 26.1 pg (ref 26.0–34.0)
MCHC: 31.9 g/dL (ref 30.0–36.0)
MCV: 81.7 fL (ref 78.0–100.0)
Platelets: 330 10*3/uL (ref 150–400)
RBC: 5.25 MIL/uL — ABNORMAL HIGH (ref 3.87–5.11)
RDW: 16.3 % — ABNORMAL HIGH (ref 11.5–15.5)
WBC: 6.6 10*3/uL (ref 4.0–10.5)

## 2018-01-09 LAB — I-STAT TROPONIN, ED: Troponin i, poc: 0 ng/mL (ref 0.00–0.08)

## 2018-01-09 LAB — I-STAT BETA HCG BLOOD, ED (MC, WL, AP ONLY): I-stat hCG, quantitative: <5 m[IU]/mL (ref <5)

## 2018-01-09 MED ORDER — OXYCODONE-ACETAMINOPHEN 5-325 MG PO TABS
2.0000 | ORAL_TABLET | Freq: Once | ORAL | Status: AC
  Administered 2018-01-09: 2 via ORAL
  Filled 2018-01-09: qty 2

## 2018-01-09 NOTE — ED Notes (Signed)
D/c reviewed with patient 

## 2018-01-09 NOTE — Discharge Instructions (Addendum)
Please take tylenol as needed for pain Return if you are worse at any time Recheck with your doctor this week if pain has not resolved.

## 2018-01-09 NOTE — ED Notes (Signed)
Pt transported to Xray. 

## 2018-01-09 NOTE — ED Triage Notes (Signed)
Pt states she has had some dizziness and weakness for the past 3-6 months, saw pcp about it but was never given a diagnosis, states this am in the shower she began to feel dizzy and had a syncopal episode striking her head on the bathtub when she fell. Pt also c/o of some generalized chest pain. A/ox4, resp e/u, nad. Pt reports she is not on anticoags.

## 2018-01-09 NOTE — ED Provider Notes (Signed)
[facility] EMERGENCY DEPARTMENT Provider Note   CSN: [id number] Arrival date & time: 01/09/18  0736     History   Chief Complaint Chief Complaint  Patient presents with  . Fall  . Chest Pain    HPI [name] is a 57 y.o. female.  HPI  57 yo female ho seizure, anemia, bpad, states she was weak this am while urinating went to stand up and became dizzy and fell into tub.  She remebers fall.  She struck central chest on tub and has sharp pain since.  States shoulder and back bruised and feels like muscle spasm.  Denies blood thinner.   ho anemia- does not know hgb, denies ho ho transfusion,  Ms- ho weakness,  States stable Seizure- states she was told it was panic attacks  Past Medical History:  Diagnosis Date  . Anemia   . Anxiety state, unspecified   . Arthritis   . Arthropathy, unspecified, site unspecified   . Asthma   . Backache, unspecified   . Bipolar disorder ([place])   . Depressive disorder, not elsewhere classified   . Esophageal reflux   . Fibromyalgia   . Hypertension   . MS (multiple sclerosis) ([place])    remission at present(past hx. bedridden) -ambulates with cane-left side weaker  . Obstructive sleep apnea (adult) (pediatric)    no cpap-uses Oxygen nightly 2l/m   . On home oxygen therapy 05-19-13   nightly at 2l/m  . Other abnormal glucose    05-19-13 no problems now  . Seizures ([place])    last seizure 2014    Patient Active Problem List   Diagnosis Date Noted  . Weakness 09/11/2016  . Seizure disorder ([place]) 12/30/2013  . Special screening for malignant neoplasms, colon 05/18/2013  . Major depressive disorder, recurrent, severe with psychotic features ([place]) 04/05/2012  . Generalized anxiety disorder 04/05/2012  . Elevated LFTs 10/15/2010  . Abdominal pain 09/27/2009  . Extrinsic asthma, unspecified 06/04/2009  . VOCAL CORD DISORDER 04/02/2009  . PERSONAL HISTORY OF ALLERGY TO SEAFOOD 10/10/2008  . HEADACHE 10/07/2008  .  RHINOCONJUNCTIVITIS, ALLERGIC 09/20/2008  . ALLERGY TO INSECTS AND ARACHNIDS 09/20/2008  . DYSPHAGIA PHARYNGOESOPHAGEAL PHASE 09/12/2008  . G E R D 07/05/2008  . ANXIETY 06/13/2008  . Depression 06/13/2008  . OSA (obstructive sleep apnea) 06/13/2008  . ARTHRITIS 06/13/2008  . Chronic back pain 06/13/2008  . HYPERGLYCEMIA 06/13/2008    Past Surgical History:  Procedure Laterality Date  . APPENDECTOMY    . BREAST LUMPECTOMY WITH NEEDLE LOCALIZATION Left 06/06/2015   Procedure: LEFT BREAST LUMPECTOMY WITH NEEDLE LOCALIZATION TIMES TWO;  Surgeon: Erroll Luna, MD;  Location: [facility];  Service: General;  Laterality: Left;  . BREAST SURGERY    . CESAREAN SECTION  1988   twins  . CHOLECYSTECTOMY    . COLONOSCOPY N/A 06/12/2013   Procedure: COLONOSCOPY;  Surgeon: Irene Shipper, MD;  [facility];  Service: Endoscopy;  Laterality: N/A;  . HERNIA REPAIR     umbilical  . TUBAL LIGATION    . VAGINAL HYSTERECTOMY       OB History    Gravida  2   Para  2   Term  1   Preterm  1   AB      Living  3     SAB      TAB      Ectopic      Multiple  1   Live Births  Home Medications    Prior to Admission medications   Medication Sig Start Date End Date Taking? Authorizing Provider  ADVAIR DISKUS 500-50 MCG/DOSE AEPB USE ONE INHALATION BY MOUTH EVERY 12 HOURS FOR ASTHMA    Young, Kasandra Knudsen, MD  ALPRAZolam Duanne Moron) 0.5 MG tablet Take 1 tablet by mouth 3 (three) times daily as needed for anxiety or sleep.  10/02/12   [provider]  EPINEPHrine (EPI-PEN) 0.3 mg/0.3 mL DEVI Inject 0.3 mg into the muscle once. As needed for severe allergic reaction 04/07/12   Readling, Milana Huntsman, MD  famotidine (PEPCID) 20 MG tablet Take 1 tablet (20 mg total) by mouth 2 (two) times daily. For stomach acid. 04/07/12   Readling, Milana Huntsman, MD  FLUoxetine (PROZAC) 20 MG capsule Take 20 mg by mouth daily.    [provider]    HYDROcodone-acetaminophen (NORCO) 10-325 MG tablet Take 1 tablet by mouth 3 (three) times daily as needed for moderate pain.    [provider]  lamoTRIgine (LAMICTAL) 25 MG tablet Take 75 mg by mouth 2 (two) times daily.    [provider]  levETIRAcetam (KEPPRA) 500 MG tablet Take 1 tablet (500 mg total) by mouth 2 (two) times daily. 09/12/16 10/12/16  Reyne Dumas, MD  metoprolol succinate (TOPROL-XL) 50 MG 24 hr tablet Take 1 tablet (50 mg total) by mouth daily. 11/22/17   Billie Ruddy, MD  Multiple Vitamin (MULTIVITAMIN WITH MINERALS) TABS Take 1 tablet by mouth daily. For nutritional supplementation. 04/07/12   Readling, Milana Huntsman, MD  polyethylene glycol (MIRALAX / GLYCOLAX) packet Take 17 g by mouth daily as needed for mild constipation. 09/12/16   Reyne Dumas, MD  PROAIR HFA 108 (90 BASE) MCG/ACT inhaler INHALE 2 PUFFS BY MOUTH EVERY 4 HOURS AS NEEDED FOR WHEEZING OR SHORTNESS OF BREATH    Young, Clinton D, MD  traZODone (DESYREL) 150 MG tablet Take 150 mg by mouth at bedtime.    [provider]  trifluoperazine (STELAZINE) 2 MG tablet Take 2 mg by mouth at bedtime.    [provider]    Family History Family History  Problem Relation Age of Onset  . Heart disease Father   . Ovarian cancer Mother   . Colon cancer Neg Hx   . Esophageal cancer Neg Hx   . Stomach cancer Neg Hx   . Rectal cancer Neg Hx   . Breast cancer Neg Hx     Social History Social History   Tobacco Use  . Smoking status: Former Smoker    Packs/day: 1.00    Years: 5.00    Pack years: 5.00    Last attempt to quit: 07/07/2007    Years since quitting: 10.5  . Smokeless tobacco: Never Used  Substance Use Topics  . Alcohol use: No  . Drug use: No     Allergies   Bee venom; Depakote [divalproex sodium]; Latex; Peanut-containing drug products; Penicillins; Seroquel [quetiapine]; Shellfish-derived products; Amitriptyline hcl; Butorphanol tartrate; Gabapentin; and  Iodine   Review of Systems Review of Systems   Physical Exam Updated Vital Signs BP 108/69   Pulse 80   Temp 98.6 F (37 C) (Oral)   Resp 16   SpO2 97%   Physical Exam  Constitutional: She is oriented to person, place, and time. She appears well-developed and well-nourished.  HENT:  Head: Normocephalic and atraumatic.  Eyes: Pupils are equal, round, and reactive to light. EOM are normal.  Neck: Normal range of motion.  Contusion lower  neck with some diffuse ttp  Cardiovascular: Normal rate, regular rhythm, intact distal pulses and normal pulses.  Pulmonary/Chest: Effort normal and breath sounds normal.  Abdominal: Soft. Bowel sounds are normal.  Musculoskeletal: Normal range of motion.       Right lower leg: Normal.       Left lower leg: Normal.  Neurological: She is alert and oriented to person, place, and time.  Skin: Skin is warm and dry. Capillary refill takes less than 2 seconds.  Psychiatric: She has a normal mood and affect.  Nursing note and vitals reviewed.    ED Treatments / Results  Labs (all labs ordered are listed, but only abnormal results are displayed) Labs Reviewed  CBC - Abnormal; Notable for the following components:      Result Value   RBC 5.25 (*)    RDW 16.3 (*)    All other components within normal limits  BASIC METABOLIC PANEL  I-STAT TROPONIN, ED  I-STAT BETA HCG BLOOD, ED ([facility], [facility], [facility] ONLY)    EKG None  Radiology Dg Chest 2 View  Result Date: 01/09/2018 CLINICAL DATA:  Weakness and dizziness.  Syncope. EXAM: CHEST - 2 VIEW COMPARISON:  September 11, 2016 FINDINGS: Lungs are clear. Heart size and pulmonary vascularity are normal. No adenopathy. No bone lesions. IMPRESSION: No edema or consolidation. Electronically Signed   By: Lowella Grip III M.D.   On: 01/09/2018 08:13    Procedures Procedures (including critical care time)  Medications Ordered in ED Medications - No data to display   Initial Impression / Assessment and Plan /  ED Course  I have reviewed the triage vital signs and the nursing notes.  Pertinent labs & imaging results that were available during my care of the patient were reviewed by me and considered in my medical decision making (see chart for details).     57 yo female with fall secondary to near syncope.  Pain to upper back and contusion to head.  CT head,cervical spin and thoracic spine without any acute abnormality.  Work up here without evidence of acute ischemia. Patient appears stable for discharge.  Need for close follow up and return precautins discussed.   Final Clinical Impressions(s) / ED Diagnoses   Final diagnoses:  Near syncope  Contusion of chest wall, unspecified laterality, initial encounter  Contusion of head, unspecified part of head, initial encounter    ED Discharge Orders    None       Pattricia Boss, MD 01/09/18 1123

## 2018-01-24 DIAGNOSIS — F250 Schizoaffective disorder, bipolar type: Secondary | ICD-10-CM | POA: Diagnosis not present

## 2018-03-01 DIAGNOSIS — M5441 Lumbago with sciatica, right side: Secondary | ICD-10-CM | POA: Diagnosis not present

## 2018-03-01 DIAGNOSIS — R1312 Dysphagia, oropharyngeal phase: Secondary | ICD-10-CM

## 2018-03-01 DIAGNOSIS — G894 Chronic pain syndrome: Secondary | ICD-10-CM | POA: Diagnosis not present

## 2018-03-01 DIAGNOSIS — M533 Sacrococcygeal disorders, not elsewhere classified: Secondary | ICD-10-CM | POA: Diagnosis not present

## 2018-03-01 DIAGNOSIS — K219 Gastro-esophageal reflux disease without esophagitis: Secondary | ICD-10-CM | POA: Diagnosis not present

## 2018-03-01 DIAGNOSIS — M7918 Myalgia, other site: Secondary | ICD-10-CM | POA: Diagnosis not present

## 2018-03-01 DIAGNOSIS — J45909 Unspecified asthma, uncomplicated: Secondary | ICD-10-CM | POA: Diagnosis not present

## 2018-03-01 DIAGNOSIS — Z9189 Other specified personal risk factors, not elsewhere classified: Secondary | ICD-10-CM | POA: Diagnosis not present

## 2018-03-01 DIAGNOSIS — G8929 Other chronic pain: Secondary | ICD-10-CM | POA: Diagnosis not present

## 2018-03-01 DIAGNOSIS — Z87891 Personal history of nicotine dependence: Secondary | ICD-10-CM | POA: Diagnosis not present

## 2018-03-01 DIAGNOSIS — J342 Deviated nasal septum: Secondary | ICD-10-CM | POA: Diagnosis not present

## 2018-03-01 HISTORY — DX: Dysphagia, oropharyngeal phase: R13.12

## 2018-03-04 ENCOUNTER — Other Ambulatory Visit: Payer: Self-pay | Admitting: Otolaryngology

## 2018-03-04 DIAGNOSIS — R1312 Dysphagia, oropharyngeal phase: Secondary | ICD-10-CM

## 2018-03-17 ENCOUNTER — Ambulatory Visit
Admission: RE | Admit: 2018-03-17 | Discharge: 2018-03-17 | Disposition: A | Discharge status: Home / Self Care | Payer: Medicare Other | Source: Ambulatory Visit | Attending: Otolaryngology | Admitting: Otolaryngology

## 2018-03-17 DIAGNOSIS — R1310 Dysphagia, unspecified: Secondary | ICD-10-CM | POA: Diagnosis not present

## 2018-03-17 DIAGNOSIS — M542 Cervicalgia: Secondary | ICD-10-CM | POA: Diagnosis not present

## 2018-03-17 DIAGNOSIS — R1312 Dysphagia, oropharyngeal phase: Secondary | ICD-10-CM

## 2018-03-29 DIAGNOSIS — G8929 Other chronic pain: Secondary | ICD-10-CM | POA: Diagnosis not present

## 2018-03-29 DIAGNOSIS — M7918 Myalgia, other site: Secondary | ICD-10-CM | POA: Diagnosis not present

## 2018-03-29 DIAGNOSIS — M533 Sacrococcygeal disorders, not elsewhere classified: Secondary | ICD-10-CM | POA: Diagnosis not present

## 2018-03-29 DIAGNOSIS — G894 Chronic pain syndrome: Secondary | ICD-10-CM | POA: Diagnosis not present

## 2018-03-29 DIAGNOSIS — M5441 Lumbago with sciatica, right side: Secondary | ICD-10-CM | POA: Diagnosis not present

## 2018-03-29 DIAGNOSIS — Z9189 Other specified personal risk factors, not elsewhere classified: Secondary | ICD-10-CM | POA: Diagnosis not present

## 2018-04-13 DIAGNOSIS — R933 Abnormal findings on diagnostic imaging of other parts of digestive tract: Secondary | ICD-10-CM | POA: Diagnosis not present

## 2018-04-13 DIAGNOSIS — R1310 Dysphagia, unspecified: Secondary | ICD-10-CM | POA: Diagnosis not present

## 2018-04-19 ENCOUNTER — Other Ambulatory Visit: Payer: Self-pay | Admitting: Family Medicine

## 2018-05-01 ENCOUNTER — Other Ambulatory Visit: Payer: Self-pay | Admitting: Family Medicine

## 2018-07-11 DIAGNOSIS — F250 Schizoaffective disorder, bipolar type: Secondary | ICD-10-CM | POA: Diagnosis not present

## 2018-07-18 ENCOUNTER — Ambulatory Visit: Payer: Medicare Other | Admitting: Family Medicine

## 2018-07-20 ENCOUNTER — Ambulatory Visit (INDEPENDENT_AMBULATORY_CARE_PROVIDER_SITE_OTHER): Payer: Medicare Other | Admitting: Family Medicine

## 2018-07-20 ENCOUNTER — Encounter: Payer: Self-pay | Admitting: Family Medicine

## 2018-07-20 VITALS — BP 130/84 | HR 78 | Temp 98.2°F | Wt 265.0 lb

## 2018-07-20 DIAGNOSIS — R413 Other amnesia: Secondary | ICD-10-CM | POA: Diagnosis not present

## 2018-07-20 DIAGNOSIS — R51 Headache: Secondary | ICD-10-CM | POA: Diagnosis not present

## 2018-07-20 DIAGNOSIS — Z13220 Encounter for screening for lipoid disorders: Secondary | ICD-10-CM

## 2018-07-20 DIAGNOSIS — R519 Headache, unspecified: Secondary | ICD-10-CM

## 2018-07-20 DIAGNOSIS — G40909 Epilepsy, unspecified, not intractable, without status epilepticus: Secondary | ICD-10-CM

## 2018-07-20 LAB — LIPID PANEL
Cholesterol: 235 mg/dL — ABNORMAL HIGH (ref 0–200)
HDL: 65.3 mg/dL (ref >39.00)
LDL Cholesterol: 149 mg/dL — ABNORMAL HIGH (ref 0–99)
NonHDL: 169.3
Total CHOL/HDL Ratio: 4
Triglycerides: 102 mg/dL (ref 0.0–149.0)
VLDL: 20.4 mg/dL (ref 0.0–40.0)

## 2018-07-20 LAB — COMPREHENSIVE METABOLIC PANEL
ALT: 17 U/L (ref 0–35)
AST: 15 U/L (ref 0–37)
Albumin: 4 g/dL (ref 3.5–5.2)
Alkaline Phosphatase: 51 U/L (ref 39–117)
BUN: 11 mg/dL (ref 6–23)
CO2: 29 mEq/L (ref 19–32)
Calcium: 9.4 mg/dL (ref 8.4–10.5)
Chloride: 103 mEq/L (ref 96–112)
Creatinine, Ser: 1.05 mg/dL (ref 0.40–1.20)
GFR: 69.62 mL/min (ref >60.00)
Glucose, Bld: 91 mg/dL (ref 70–99)
Potassium: 3.5 mEq/L (ref 3.5–5.1)
Sodium: 141 mEq/L (ref 135–145)
Total Bilirubin: 0.4 mg/dL (ref 0.2–1.2)
Total Protein: 7.2 g/dL (ref 6.0–8.3)

## 2018-07-20 LAB — CBC
HCT: 40.5 % (ref 36.0–46.0)
Hemoglobin: 13.4 g/dL (ref 12.0–15.0)
MCHC: 33 g/dL (ref 30.0–36.0)
MCV: 82.1 fl (ref 78.0–100.0)
Platelets: 301 10*3/uL (ref 150.0–400.0)
RBC: 4.93 Mil/uL (ref 3.87–5.11)
RDW: 15.7 % — ABNORMAL HIGH (ref 11.5–15.5)
WBC: 5.2 10*3/uL (ref 4.0–10.5)

## 2018-07-20 LAB — FOLATE: Folate: 17.8 ng/mL (ref >5.9)

## 2018-07-20 LAB — T4, FREE: Free T4: 0.84 ng/dL (ref 0.60–1.60)

## 2018-07-20 LAB — TSH: TSH: 0.43 u[IU]/mL (ref 0.35–4.50)

## 2018-07-20 LAB — VITAMIN B12: Vitamin B-12: 479 pg/mL (ref 211–911)

## 2018-07-20 NOTE — Progress Notes (Signed)
Subjective:    Patient ID: [name], female    DOB: [birth year], 58 y.o.   MRN: [id number]  No chief complaint on file.   HPI Patient was seen today for ongoing concern.  Pt endorses feeling "shokicing impulses" in her head x 5 yrs.  The feeling is in the frontal and occipital areas.  Pt states after the impulses she often develops a HA that may last <8 hrs.  Pt will take Advil to "take the edge off".  Feeling is described as 10/10, with Advil 5/10.  Patient endorses blurred vision, "like a film over her eyes" and may wake up with headache or "sensation.  Patient denies upper extremity weakness, numbness, nausea, vomiting.  Pt also endorses memory loss/increased forgetfulness.  Pt states 2 years ago her daughter got married, however she cannot remember details about the wedding or that she was even there. Pt also notes that she poured oil in the washing machine instead of detergent.  Pt has a h/o seizure d/o.  Was followed by [facility].  Last seizure was 5 yrs ago.  Pt was on Keppra 500 mg BID.  Past Medical History:  Diagnosis Date  . Anemia   . Anxiety state, unspecified   . Arthritis   . Arthropathy, unspecified, site unspecified   . Asthma   . Backache, unspecified   . Bipolar disorder ([place])   . Depressive disorder, not elsewhere classified   . Esophageal reflux   . Fibromyalgia   . Hypertension   . MS (multiple sclerosis) ([place])    remission at present(past hx. bedridden) -ambulates with cane-left side weaker  . Obstructive sleep apnea (adult) (pediatric)    no cpap-uses Oxygen nightly 2l/m   . On home oxygen therapy 05-19-13   nightly at 2l/m  . Other abnormal glucose    05-19-13 no problems now  . Seizures ([place])    last seizure 2014    Allergies  Allergen Reactions  . Bee Venom Anaphylaxis  . Depakote [Divalproex Sodium] Shortness Of Breath and Rash  . Haloperidol Lactate Swelling  . Latex Anaphylaxis  . Peanut-Containing Drug Products Anaphylaxis  .  Penicillins Anaphylaxis  . Seroquel [Quetiapine] Anaphylaxis  . Shellfish-Derived Products Anaphylaxis  . Amitriptyline Hcl Other (See Comments)    REACTION: HALLUCINATIONS  . Butorphanol Tartrate Other (See Comments)    REACTION: Hallucinations  . Gabapentin Other (See Comments)    Pt reports severe back and side pain  . Hornet Venom   . Iodine     REACTION: Flushing and fainting  . Pregabalin Rash    ROS General: Denies fever, chills, night sweats, changes in weight, changes in appetite  +memory loss HEENT: Denies ear pain, changes in vision, rhinorrhea, sore throat  +migraines, blurred vision CV: Denies CP, palpitations, SOB, orthopnea Pulm: Denies SOB, cough, wheezing GI: Denies abdominal pain, nausea, vomiting, diarrhea, constipation GU: Denies dysuria, hematuria, frequency, vaginal discharge Msk: Denies muscle cramps, joint pains Neuro: Denies weakness, numbness, tingling  +"shocking impulses" in head, migraines Skin: Denies rashes, bruising Psych: Denies depression, anxiety, hallucinations    Objective:    Blood pressure 130/84, pulse 78, temperature 98.2 F (36.8 C), temperature source Oral, weight 265 lb (120.2 kg), SpO2 97 %.  Gen. Pleasant, well-nourished, obese, in no distress, normal affect   HEENT: /AT, face symmetric, conjunctiva clear, no scleral icterus, PERRLA, EOMI, nystagmus noted.  Nares patent without drainage, pharynx without erythema or exudate. TMs normal b/l. Faint carotid bruit of L carotid. Lungs: no accessory  muscle use, CTAB, no wheezes or rales Cardiovascular: RRR, no m/r/g, no peripheral edema. Musculoskeletal: No deformities, no cyanosis or clubbing, normal tone.  Strength head, neck UEs, and LEs equal.   Neuro:  A&Ox3, CN II-XII intact, normal gait.  Difficulty eliciting reflexes. Skin:  Warm, no lesions/ rash  Wt Readings from Last 3 Encounters:  10/11/17 256 lb (116.1 kg)  09/11/16 230 lb 1.6 oz (104.4 kg)  07/21/15 250 lb (113.4 kg)     Lab Results  Component Value Date   WBC 6.6 01/09/2018   HGB 13.7 01/09/2018   HCT 42.9 01/09/2018   PLT 330 01/09/2018   GLUCOSE 103 (H) 01/09/2018   ALT 46 09/10/2016   AST 27 09/10/2016   NA 138 01/09/2018   K 4.2 01/09/2018   CL 106 01/09/2018   CREATININE 1.07 (H) 01/09/2018   BUN 15 01/09/2018   CO2 25 01/09/2018   TSH 0.493 09/11/2016   INR 0.99 12/25/2011   HGBA1C 6.6 (H) 11/14/2013    Assessment/Plan:  Memory loss  -possibly 2/2 infection, vascular dz, medications, vitamin deficiency -consider neuropsych testing -will obtain labs -reassuring that CT head 01/09/18 normal. - Plan: CBC (no diff), Comprehensive metabolic panel, TSH, T4, Free, Vitamin B12, Ambulatory referral to Neurology, Folate, Lipid panel  Nonintractable headache, unspecified chronicity pattern, unspecified headache type  - Plan: Ambulatory referral to Neurology, Lipid panel  Seizure disorder ([place])  -stable -no seizures in 5 yrs.  Will continue to hold keppra defer to neurology in regards to restarting. - Plan: Ambulatory referral to Neurology  Screening for cholesterol level  -faint L carotid bruit appreciated on exam.  Continue to monitor.  Consider carotid u/s. - Plan: Lipid panel   F/u prn in the next few wks  Grier Mitts, MD

## 2018-07-25 DIAGNOSIS — K210 Gastro-esophageal reflux disease with esophagitis: Secondary | ICD-10-CM | POA: Diagnosis not present

## 2018-07-25 DIAGNOSIS — R933 Abnormal findings on diagnostic imaging of other parts of digestive tract: Secondary | ICD-10-CM | POA: Diagnosis not present

## 2018-07-25 DIAGNOSIS — K2930 Chronic superficial gastritis without bleeding: Secondary | ICD-10-CM | POA: Diagnosis not present

## 2018-07-25 DIAGNOSIS — R1314 Dysphagia, pharyngoesophageal phase: Secondary | ICD-10-CM | POA: Diagnosis not present

## 2018-07-25 DIAGNOSIS — Z9889 Other specified postprocedural states: Secondary | ICD-10-CM | POA: Diagnosis not present

## 2018-07-27 ENCOUNTER — Telehealth: Payer: Self-pay | Admitting: Family Medicine

## 2018-07-27 NOTE — Telephone Encounter (Unsigned)
Copied from [place]. Topic: Referral - Status >> Jul 27, 2018 11:28 AM Virl Axe D wrote: Reason for CRM: Pt called to follow up on referral to [facility]. Please advise pt once this has been done.

## 2018-07-27 NOTE — Telephone Encounter (Signed)
Spoke with pt states that she has an appointment with [facility] for a CT scan of the brain.

## 2018-07-28 DIAGNOSIS — K2930 Chronic superficial gastritis without bleeding: Secondary | ICD-10-CM | POA: Diagnosis not present

## 2018-07-28 DIAGNOSIS — K210 Gastro-esophageal reflux disease with esophagitis: Secondary | ICD-10-CM | POA: Diagnosis not present

## 2018-07-28 NOTE — Telephone Encounter (Signed)
Pt is calling back [facility] will not schedule her for CT scan of brain due to she missed to many appointment. Pt would like a callback

## 2018-08-02 ENCOUNTER — Encounter: Payer: Self-pay | Admitting: Neurology

## 2018-08-02 DIAGNOSIS — G8929 Other chronic pain: Secondary | ICD-10-CM | POA: Diagnosis not present

## 2018-08-02 DIAGNOSIS — G894 Chronic pain syndrome: Secondary | ICD-10-CM | POA: Diagnosis not present

## 2018-08-02 DIAGNOSIS — M5441 Lumbago with sciatica, right side: Secondary | ICD-10-CM | POA: Diagnosis not present

## 2018-08-02 DIAGNOSIS — M7918 Myalgia, other site: Secondary | ICD-10-CM | POA: Diagnosis not present

## 2018-08-02 DIAGNOSIS — Z9189 Other specified personal risk factors, not elsewhere classified: Secondary | ICD-10-CM | POA: Diagnosis not present

## 2018-08-02 DIAGNOSIS — M533 Sacrococcygeal disorders, not elsewhere classified: Secondary | ICD-10-CM | POA: Diagnosis not present

## 2018-08-09 ENCOUNTER — Other Ambulatory Visit: Payer: Self-pay | Admitting: Family Medicine

## 2018-08-10 ENCOUNTER — Other Ambulatory Visit: Payer: Self-pay | Admitting: Family Medicine

## 2018-08-16 NOTE — Telephone Encounter (Signed)
Called pt left a detailed message for pt regarding her Neurology referral and her appointment that is scheduled for 10/08/2018. Pt is also aware of the appointment from [facility]

## 2018-08-25 ENCOUNTER — Ambulatory Visit: Payer: Medicare Other

## 2018-08-25 NOTE — Progress Notes (Deleted)
Subjective:   [name] is a 58 y.o. female who presents for an Initial Medicare Annual Wellness Visit.  Review of Systems    No ROS.  Medicare Wellness Visit. Additional risk factors are reflected in the social history.     Sleep patterns: {SX; SLEEP PATTERNS:18802::"feels rested on waking","does not get up to void","gets up *** times nightly to void","sleeps *** hours nightly"}.   Home Safety/Smoke Alarms: Feels safe in home. Smoke alarms in place.  Living environment; residence and Firearm Safety: {Rehab home environment / accessibility:30080::"no firearms","firearms stored safely"}. Seat Belt Safety/Bike Helmet: Wears seat belt.   Female:   Pap- 02/1981      Mammo- 09/07/2017, due 09/08/2018      Dexa scan- needs        CCS- 06/2013, due 06/2023     Objective:    There were no vitals filed for this visit. There is no height or weight on file to calculate BMI.  Advanced Directives 09/10/2016 03/30/2016 07/21/2015 06/06/2015 05/29/2015 04/10/2014 06/12/2013  Does Patient Have a Medical Advance Directive? No No No No No No Patient does not have advance directive  Would patient like information on creating a medical advance directive? No - Patient declined No - patient declined information Yes - Educational materials given No - patient declined information No - patient declined information - -  Pre-existing out of facility DNR order (yellow form or pink MOST form) - - - - - - No  Some encounter information is confidential and restricted. Go to Review Flowsheets activity to see all data.    Current Medications (verified) Outpatient Encounter Medications as of 08/25/2018  Medication Sig  . ADVAIR DISKUS 500-50 MCG/DOSE AEPB USE ONE INHALATION BY MOUTH EVERY 12 HOURS FOR ASTHMA  . benztropine (COGENTIN) 0.5 MG tablet Take 5 mg by mouth daily.  . cyclobenzaprine (FLEXERIL) 10 MG tablet Take 10 mg by mouth 3 (three) times daily as needed.  . doxepin (SINEQUAN) 10 MG capsule Take 20  mg by mouth at bedtime.  Marland Kitchen EPINEPHrine (EPI-PEN) 0.3 mg/0.3 mL DEVI Inject 0.3 mg into the muscle once. As needed for severe allergic reaction  . famotidine (PEPCID) 20 MG tablet Take 1 tablet (20 mg total) by mouth 2 (two) times daily. For stomach acid.  Marland Kitchen FLUoxetine (PROZAC) 20 MG capsule Take 20 mg by mouth daily.  Marland Kitchen HYDROcodone-acetaminophen (NORCO) 10-325 MG tablet Take 1 tablet by mouth 3 (three) times daily as needed for moderate pain.  . hydrOXYzine (ATARAX/VISTARIL) 25 MG tablet Take 25 mg by mouth 3 (three) times daily.  Marland Kitchen lamoTRIgine (LAMICTAL) 25 MG tablet Take 25 mg by mouth at bedtime.   . levETIRAcetam (KEPPRA) 500 MG tablet Take 1 tablet (500 mg total) by mouth 2 (two) times daily. (Patient not taking: Reported on 01/09/2018)  . methocarbamol (ROBAXIN) 500 MG tablet Take 500 mg by mouth daily.  . metoprolol succinate (TOPROL-XL) 50 MG 24 hr tablet TAKE 1 TABLET BY MOUTH DAILY  . Multiple Vitamin (MULTIVITAMIN WITH MINERALS) TABS Take 1 tablet by mouth daily. For nutritional supplementation.  . Multiple Vitamins-Minerals (MULTIVITAMIN WITH MINERALS) tablet Take 1 tablet by mouth daily.  . polyethylene glycol (MIRALAX / GLYCOLAX) packet Take 17 g by mouth daily as needed for mild constipation.  Marland Kitchen PROAIR HFA 108 (90 BASE) MCG/ACT inhaler INHALE 2 PUFFS BY MOUTH EVERY 4 HOURS AS NEEDED FOR WHEEZING OR SHORTNESS OF BREATH  . traZODone (DESYREL) 150 MG tablet Take 150 mg by mouth at bedtime.  Marland Kitchen  trifluoperazine (STELAZINE) 2 MG tablet Take 2 mg by mouth at bedtime.   No facility-administered encounter medications on file as of 08/25/2018.     Allergies (verified) Bee venom; Depakote [divalproex sodium]; Haloperidol lactate; Latex; Peanut-containing drug products; Penicillins; Seroquel [quetiapine]; Shellfish-derived products; Amitriptyline hcl; Butorphanol tartrate; Gabapentin; Hornet venom; Iodine; and Pregabalin   History: Past Medical History:  Diagnosis Date  . Anemia   . Anxiety  state, unspecified   . Arthritis   . Arthropathy, unspecified, site unspecified   . Asthma   . Backache, unspecified   . Bipolar disorder ([place])   . Depressive disorder, not elsewhere classified   . Esophageal reflux   . Fibromyalgia   . Hypertension   . MS (multiple sclerosis) ([place])    remission at present(past hx. bedridden) -ambulates with cane-left side weaker  . Obstructive sleep apnea (adult) (pediatric)    no cpap-uses Oxygen nightly 2l/m   . On home oxygen therapy 05-19-13   nightly at 2l/m  . Other abnormal glucose    05-19-13 no problems now  . Seizures ([place])    last seizure 2014   Past Surgical History:  Procedure Laterality Date  . APPENDECTOMY    . BREAST LUMPECTOMY WITH NEEDLE LOCALIZATION Left 06/06/2015   Procedure: LEFT BREAST LUMPECTOMY WITH NEEDLE LOCALIZATION TIMES TWO;  Surgeon: Erroll Luna, MD;  Location: [facility];  Service: General;  Laterality: Left;  . BREAST SURGERY    . CESAREAN SECTION  1988   twins  . CHOLECYSTECTOMY    . COLONOSCOPY N/A 06/12/2013   Procedure: COLONOSCOPY;  Surgeon: Irene Shipper, MD;  Location: [facility];  Service: Endoscopy;  Laterality: N/A;  . HERNIA REPAIR     umbilical  . TUBAL LIGATION    . VAGINAL HYSTERECTOMY     Family History  Problem Relation Age of Onset  . Heart disease Father   . Ovarian cancer Mother   . Colon cancer Neg Hx   . Esophageal cancer Neg Hx   . Stomach cancer Neg Hx   . Rectal cancer Neg Hx   . Breast cancer Neg Hx    Social History   Socioeconomic History  . Marital status: Divorced    Spouse name: Not on file  . Number of children: 3  . Years of education: 12+  . Highest education level: Not on file  Occupational History  . Occupation: Disabled  Social Needs  . Financial resource strain: Not on file  . Food insecurity:    Worry: Not on file    Inability: Not on file  . Transportation needs:    Medical: Not on file    Non-medical: Not on file  Tobacco Use  .  Smoking status: Former Smoker    Packs/day: 1.00    Years: 5.00    Pack years: 5.00    Last attempt to quit: 07/07/2007    Years since quitting: 11.1  . Smokeless tobacco: Never Used  Substance and Sexual Activity  . Alcohol use: No  . Drug use: No  . Sexual activity: Not Currently    Birth control/protection: Surgical    Comment: X 1 year  Lifestyle  . Physical activity:    Days per week: Not on file    Minutes per session: Not on file  . Stress: Not on file  Relationships  . Social connections:    Talks on phone: Not on file    Gets together: Not on file    Attends religious service:  Not on file    Active member of club or organization: Not on file    Attends meetings of clubs or organizations: Not on file    Relationship status: Not on file  Other Topics Concern  . Not on file  Social History Narrative   Single, 3 children   Right handed   Associates degree   occ 1 cup daily    Tobacco Counseling Counseling given: Not Answered   Activities of Daily Living No flowsheet data found.   Immunizations and Health Maintenance Immunization History  Administered Date(s) Administered  . Influenza Split 04/06/2012  . Influenza Whole 04/08/2009, 04/05/2010  . Influenza,inj,Quad PF,6+ Mos 03/22/2013  . Pneumococcal Polysaccharide-23 04/15/2009, 04/06/2012   Health Maintenance Due  Topic Date Due  . Hepatitis C Screening  August 15, 1960  . TETANUS/TDAP  02/25/1981  . PAP SMEAR-Modifier  02/26/1983  . INFLUENZA VACCINE  02/03/2018    Patient Care Team: Billie Ruddy, MD as PCP - General (Family Medicine) Elsie Stain, MD (Pulmonary Disease) Ena Dawley, MD as Consulting Physician (Obstetrics and Gynecology)  Indicate any recent Medical Services you may have received from other than [facility] providers in the past year (date may be approximate).     Assessment:   This is a routine wellness examination for [place]. Physical assessment deferred to  PCP.   Hearing/Vision screen No exam data present  Dietary issues and exercise activities discussed:   Diet (meal preparation, eat out, water intake, caffeinated beverages, dairy products, fruits and vegetables): {Desc; diets:16563}       Goals   None    Depression Screen No flowsheet data found.  Fall Risk No flowsheet data found.   Cognitive Function:        Screening Tests Health Maintenance  Topic Date Due  . Hepatitis C Screening  01[birth year]/1962  . TETANUS/TDAP  02/25/1981  . PAP SMEAR-Modifier  02/26/1983  . INFLUENZA VACCINE  02/03/2018  . MAMMOGRAM  09/08/2019  . COLONOSCOPY  06/13/2023  . HIV Screening  Completed    Qualifies for Shingles Vaccine?       Plan:     I have personally reviewed and noted the following in the patient's chart:   . Medical and social history . Use of alcohol, tobacco or illicit drugs  . Current medications and supplements . Functional ability and status . Nutritional status . Physical activity . Advanced directives . List of other physicians . Hospitalizations, surgeries, and ER visits in previous 12 months . Vitals . Screenings to include cognitive, depression, and falls . Referrals and appointments  In addition, I have reviewed and discussed with patient certain preventive protocols, quality metrics, and best practice recommendations. A written personalized care plan for preventive services as well as general preventive health recommendations were provided to patient.     Alphia Moh, RN   08/25/2018

## 2018-08-29 NOTE — Telephone Encounter (Signed)
Spoke with pt sounded very confused regarding her appointment,Pt state that she thought she had a CT scan today with [facility], pt was advised about her upcoming appointments and verbalized understanding

## 2018-09-01 ENCOUNTER — Telehealth (INDEPENDENT_AMBULATORY_CARE_PROVIDER_SITE_OTHER): Payer: Medicare Other

## 2018-09-01 ENCOUNTER — Ambulatory Visit (INDEPENDENT_AMBULATORY_CARE_PROVIDER_SITE_OTHER): Payer: Medicare Other

## 2018-09-01 ENCOUNTER — Telehealth: Payer: Self-pay

## 2018-09-01 VITALS — BP 130/84 | HR 72 | Temp 98.1°F | Resp 18 | Ht 69.0 in | Wt 271.0 lb

## 2018-09-01 DIAGNOSIS — Z0000 Encounter for general adult medical examination without abnormal findings: Secondary | ICD-10-CM

## 2018-09-01 DIAGNOSIS — Z780 Asymptomatic menopausal state: Secondary | ICD-10-CM | POA: Diagnosis not present

## 2018-09-01 DIAGNOSIS — Z1159 Encounter for screening for other viral diseases: Secondary | ICD-10-CM

## 2018-09-01 DIAGNOSIS — R32 Unspecified urinary incontinence: Secondary | ICD-10-CM | POA: Diagnosis not present

## 2018-09-01 DIAGNOSIS — Z23 Encounter for immunization: Secondary | ICD-10-CM

## 2018-09-01 LAB — POC URINALSYSI DIPSTICK (AUTOMATED)
Blood, UA: NEGATIVE
Glucose, UA: NEGATIVE
Ketones, UA: NEGATIVE
Leukocytes, UA: NEGATIVE
Nitrite, UA: NEGATIVE
Protein, UA: POSITIVE — AB
Spec Grav, UA: 1.02 (ref 1.010–1.025)
Urobilinogen, UA: 1 E.U./dL
pH, UA: 6 (ref 5.0–8.0)

## 2018-09-01 NOTE — Patient Instructions (Addendum)
Will follow up with Dr. Volanda Napoleon regarding urine results, Hep C screen, and CT.  Refer to sleep hygiene and weight loss tips provided.  Bone density scan order placed. You should hear from [facility] in next week or so to schedule.   Ms. Azizi , Thank you for taking time to come for your Medicare Wellness Visit. I appreciate your ongoing commitment to your health goals. Please review the following plan we discussed and let me know if I can assist you in the future.   These are the goals we discussed: Goals    . Patient Stated     Lose 100 pounds by focusing on increasing protein intake water intake, getting better sleep! Get better back pain control!       This is a list of the screening recommended for you and due dates:  Health Maintenance  Topic Date Due  .  Hepatitis C: One time screening is recommended by Center for Disease Control  (CDC) for  adults born from [birth year] through 1964.   02-14-1961  . Tetanus Vaccine  02/25/1981  . Flu Shot  02/03/2018  . Mammogram  09/08/2019  . Colon Cancer Screening  06/13/2023  . HIV Screening  Completed  . Pap Smear  Discontinued

## 2018-09-01 NOTE — Telephone Encounter (Signed)
Author phoned [place], sister, to review urine results and to relay no need for CT head per conversation with Dr. Volanda Napoleon. Pt. was made aware during visit of no need for CT head at this time before getting blood drawn, but pt. Is forgetful and poor historian. No answer on sister's home, and mobile invalid number apparently. Author left generic VM asking for return call.

## 2018-09-01 NOTE — Telephone Encounter (Signed)
Pt stopped by the office to have lab work and wanted to have UA done for symptoms of urinary incontinence.

## 2018-09-01 NOTE — Progress Notes (Signed)
Subjective:   [name] is a 58 y.o. female who presents for an Initial Medicare Annual Wellness Visit.  Review of Systems    No ROS.  Medicare Wellness Visit. Additional risk factors are reflected in the social history.   Cardiac Risk Factors include: obesity (BMI >30kg/m2);sedentary lifestyle;dyslipidemia Sleep patterns: has interrupted sleep and gets up 5 times nightly to void. Pt. states she sleeps about 4 hours sleep every night.    Home Safety/Smoke Alarms: Feels safe in home. Smoke alarms in place.  Living environment; residence and Firearm Safety: [place], equipment: Cane, Type: Wide [employer]. Mostly lives on first level, but needs to use stairs to use bathroom, and has fallen several times per pt. Report. Pt. Looking into getting walk-in shower, tub bench. Pt. has grab bars in bathroom. Fall precautions reviewed but d/t pt's poor memory, often forgets to do basic safety precautions, like turning lights on for example.   Seat Belt Safety/Bike Helmet: Wears seat belt.   Female:   Pap- had hysterectomy 2005, sees Dr. Raphael Gibney      Mammo- 12/2017, due 09/2018. Pt. Stated she has one scheduled in march. Was told by surgeon to get mammogram every 6 months d/t mother hx of breast/ovarian CA, and pt's hx nipple discharge. Dexa scan- None. Order placed.        CCS- 06/2013, due 06/2023      Objective:    Today's Vitals   09/01/18 1435  BP: 130/84  Pulse: 72  Resp: 18  Temp: 98.1 F (36.7 C)  SpO2: 97%  Weight: 271 lb (122.9 kg)  Height: 5\' 9"  (1.753 m)  PainSc: 7   PainLoc: Back   Body mass index is 40.02 kg/m.  Advanced Directives 09/01/2018 09/10/2016 03/30/2016 07/21/2015 06/06/2015 05/29/2015 04/10/2014  Does Patient Have a Medical Advance Directive? No No No No No No No  Would patient like information on creating a medical advance directive? No - Patient declined No - Patient declined No - patient declined information Yes - Educational materials given  No - patient declined information No - patient declined information -  Pre-existing out of facility DNR order (yellow form or pink MOST form) - - - - - - -  Some encounter information is confidential and restricted. Go to Review Flowsheets activity to see all data.    Current Medications (verified) Outpatient Encounter Medications as of 09/01/2018  Medication Sig  . ADVAIR DISKUS 500-50 MCG/DOSE AEPB USE ONE INHALATION BY MOUTH EVERY 12 HOURS FOR ASTHMA  . benztropine (COGENTIN) 0.5 MG tablet Take 5 mg by mouth daily.  . cyclobenzaprine (FLEXERIL) 10 MG tablet Take 10 mg by mouth 3 (three) times daily as needed.  . doxepin (SINEQUAN) 10 MG capsule Take 20 mg by mouth at bedtime.  Marland Kitchen EPINEPHrine (EPI-PEN) 0.3 mg/0.3 mL DEVI Inject 0.3 mg into the muscle once. As needed for severe allergic reaction  . famotidine (PEPCID) 20 MG tablet Take 1 tablet (20 mg total) by mouth 2 (two) times daily. For stomach acid.  Marland Kitchen FLUoxetine (PROZAC) 20 MG capsule Take 20 mg by mouth daily.  Marland Kitchen HYDROcodone-acetaminophen (NORCO) 10-325 MG tablet Take 1 tablet by mouth 3 (three) times daily as needed for moderate pain.  . hydrOXYzine (ATARAX/VISTARIL) 25 MG tablet Take 25 mg by mouth 3 (three) times daily.  Marland Kitchen lamoTRIgine (LAMICTAL) 25 MG tablet Take 25 mg by mouth at bedtime.   . metoprolol succinate (TOPROL-XL) 50 MG 24 hr tablet TAKE 1 TABLET BY MOUTH  DAILY  . Multiple Vitamin (MULTIVITAMIN WITH MINERALS) TABS Take 1 tablet by mouth daily. For nutritional supplementation.  . polyethylene glycol (MIRALAX / GLYCOLAX) packet Take 17 g by mouth daily as needed for mild constipation.  Marland Kitchen PROAIR HFA 108 (90 BASE) MCG/ACT inhaler INHALE 2 PUFFS BY MOUTH EVERY 4 HOURS AS NEEDED FOR WHEEZING OR SHORTNESS OF BREATH  . traZODone (DESYREL) 150 MG tablet Take 150 mg by mouth at bedtime.  Marland Kitchen trifluoperazine (STELAZINE) 2 MG tablet Take 2 mg by mouth at bedtime.  . [DISCONTINUED] levETIRAcetam (KEPPRA) 500 MG tablet Take 1 tablet  (500 mg total) by mouth 2 (two) times daily. (Patient not taking: Reported on 01/09/2018)  . [DISCONTINUED] methocarbamol (ROBAXIN) 500 MG tablet Take 500 mg by mouth daily.  . [DISCONTINUED] Multiple Vitamins-Minerals (MULTIVITAMIN WITH MINERALS) tablet Take 1 tablet by mouth daily.   No facility-administered encounter medications on file as of 09/01/2018.     Allergies (verified) Bee venom; Depakote [divalproex sodium]; Haloperidol lactate; Latex; Peanut-containing drug products; Penicillins; Quetiapine; Shellfish-derived products; Valproic acid; Amitriptyline hcl; Butorphanol tartrate; Gabapentin; Hornet venom; Iodine; Terbutaline; and Pregabalin   History: Past Medical History:  Diagnosis Date  . Anemia   . Anxiety state, unspecified   . Arthritis   . Arthropathy, unspecified, site unspecified   . Asthma   . Backache, unspecified   . Bipolar disorder ([place])   . Depressive disorder, not elsewhere classified   . Esophageal reflux   . Fibromyalgia   . Hypertension   . MS (multiple sclerosis) ([place])    remission at present(past hx. bedridden) -ambulates with cane-left side weaker  . Obstructive sleep apnea (adult) (pediatric)    no cpap-uses Oxygen nightly 2l/m   . On home oxygen therapy 05-19-13   nightly at 2l/m  . Other abnormal glucose    05-19-13 no problems now  . Seizures ([place])    last seizure 2014   Past Surgical History:  Procedure Laterality Date  . APPENDECTOMY    . BREAST LUMPECTOMY WITH NEEDLE LOCALIZATION Left 06/06/2015   Procedure: LEFT BREAST LUMPECTOMY WITH NEEDLE LOCALIZATION TIMES TWO;  Surgeon: Erroll Luna, MD;  Location: [facility];  Service: General;  Laterality: Left;  . BREAST SURGERY    . CESAREAN SECTION  1988   twins  . CHOLECYSTECTOMY    . COLONOSCOPY N/A 06/12/2013   Procedure: COLONOSCOPY;  Surgeon: Irene Shipper, MD;  Location: [facility];  Service: Endoscopy;  Laterality: N/A;  . HERNIA REPAIR     umbilical  . TUBAL LIGATION     . VAGINAL HYSTERECTOMY     Family History  Problem Relation Age of Onset  . Heart disease Father   . Ovarian cancer Mother   . Breast cancer Mother   . Colon cancer Neg Hx   . Esophageal cancer Neg Hx   . Stomach cancer Neg Hx   . Rectal cancer Neg Hx    Social History   Socioeconomic History  . Marital status: Divorced    Spouse name: Not on file  . Number of children: 3  . Years of education: 12+  . Highest education level: Not on file  Occupational History  . Occupation: Disabled  Social Needs  . Financial resource strain: Somewhat hard  . Food insecurity:    Worry: Sometimes true    Inability: Sometimes true  . Transportation needs:    Medical: Yes    Non-medical: Yes  Tobacco Use  . Smoking status: Former Smoker  Packs/day: 1.00    Years: 5.00    Pack years: 5.00    Last attempt to quit: 07/07/2007    Years since quitting: 11.1  . Smokeless tobacco: Never Used  Substance and Sexual Activity  . Alcohol use: No  . Drug use: No  . Sexual activity: Not Currently    Birth control/protection: Surgical    Comment: X 1 year  Lifestyle  . Physical activity:    Days per week: 0 days    Minutes per session: 0 min  . Stress: Rather much  Relationships  . Social connections:    Talks on phone: More than three times a week    Gets together: Once a week    Attends religious service: More than 4 times per year    Active member of club or organization: No    Attends meetings of clubs or organizations: Never    Relationship status: Not on file  Other Topics Concern  . Not on file  Social History Narrative   Single, 3 children   Right handed   Associates degree   occ 1 cup daily      09/01/2018: Lives with sister, Rise Paganini, who helps manage medications, in 2 story house, and a pit bull   Has one daughter in [place], 2 sons in [place]; 7 grandchildren   Currently not exercising, but wants to return to water aerobics at [facility] given: Not  Answered    Activities of Daily Living In your present state of health, do you have any difficulty performing the following activities: 09/01/2018  Hearing? N  Vision? Y  Difficulty concentrating or making decisions? Y  Comment x 10 years per pt. report. Scheduled to see neurology 3/11  Walking or climbing stairs? Y  Dressing or bathing? N  Doing errands, shopping? N  Preparing Food and eating ? N  Using the Toilet? N  In the past six months, have you accidently leaked urine? Y  Comment UA ordered per Dr. Volanda Napoleon  Do you have problems with loss of bowel control? N  Managing your Medications? Y  Comment sister assists  Managing your Finances? Y  Comment sister assists  Housekeeping or managing your Housekeeping? Y  Some recent data might be hidden     Immunizations and Health Maintenance Immunization History  Administered Date(s) Administered  . Influenza Split 04/06/2012  . Influenza Whole 04/08/2009, 04/05/2010  . Influenza,inj,Quad PF,6+ Mos 03/22/2013, 09/01/2018  . Pneumococcal Polysaccharide-23 04/15/2009, 04/06/2012   There are no preventive care reminders to display for this patient.  Patient Care Team: Billie Ruddy, MD as PCP - General (Family Medicine) Elsie Stain, MD (Pulmonary Disease) Ena Dawley, MD as Consulting Physician (Obstetrics and Gynecology) Pieter Partridge, DO as Consulting Physician (Neurology)  Indicate any recent Medical Services you may have received from other than [facility] providers in the past year (date may be approximate).     Assessment:   This is a routine wellness examination for [place]. Physical assessment deferred to PCP.   Hearing/Vision screen Hearing Screening Comments: Able to hear conversational tones w/o difficulty. No issues reported.   Declined audiology exam at this time. Vision Screening Comments: Sees ophthamologist yearly, but could not remember name of provider/practice. Declines vision exam at this  time.  Dietary issues and exercise activities discussed: Current Exercise Habits: The patient does not participate in regular exercise at present, Exercise limited by: psychological condition(s);neurologic condition(s);orthopedic condition(s) Diet (meal preparation, eat out, water  intake, caffeinated beverages, dairy products, fruits and vegetables): in general, an "unhealthy" diet. Eats a lot of frozen fruit, skips meals often. Eats dinner usually. High protein diet discussed. Pt. States she drinks a lot of water during the day. Ensure samples provided with coupons.       Goals    . Patient Stated     Lose 100 pounds by focusing on increasing protein intake water intake, getting better sleep! Get better back pain control!      Depression Screen PHQ 2/9 Scores 09/01/2018  PHQ - 2 Score 2  PHQ- 9 Score 9    Fall Risk Fall Risk  09/01/2018  Falls in the past year? 1  Number falls in past yr: 1  Risk for fall due to : Impaired balance/gait;Impaired vision;Impaired mobility;History of fall(s);Medication side effect  Follow up Education provided;Falls prevention discussed;Follow up appointment  Comment pt. to see neurology 3/11     Cognitive Function: MMSE - Mini Mental State Exam 09/01/2018  Orientation to time 4  Orientation to Place 5  Registration 2  Attention/ Calculation 0  Recall 1  Language- name 2 objects 2  Language- repeat 1  Language- follow 3 step command 1  Language- read & follow direction 1  Write a sentence 1  Copy design 0  Total score 18        Screening Tests Health Maintenance  Topic Date Due  . TETANUS/TDAP  07/07/2019 (Originally 02/25/1980)  . MAMMOGRAM  09/08/2019  . COLONOSCOPY  06/13/2023  . INFLUENZA VACCINE  Completed  . Hepatitis C Screening  Completed  . HIV Screening  Completed  . PAP SMEAR-Modifier  Discontinued      Plan:    Will follow up with Dr. Volanda Napoleon regarding urine results, Hep C screen, and CT.  Refer to sleep hygiene and  weight loss tips provided.  Bone density scan order placed. You should hear from [facility] in next week or so to schedule I have personally reviewed and noted the following in the patient's chart:   . Medical and social history . Use of alcohol, tobacco or illicit drugs  . Current medications and supplements . Functional ability and status . Nutritional status . Physical activity . Advanced directives . List of other physicians . Hospitalizations, surgeries, and ER visits in previous 12 months . Vitals . Screenings to include cognitive, depression, and falls . Referrals and appointments  In addition, I have reviewed and discussed with patient certain preventive protocols, quality metrics, and best practice recommendations. A written personalized care plan for preventive services as well as general preventive health recommendations were provided to patient.     Alphia Moh, RN   09/01/2018

## 2018-09-02 LAB — HEPATITIS C ANTIBODY
Hepatitis C Ab: NONREACTIVE
SIGNAL TO CUT-OFF: 0.02 (ref <1.00)

## 2018-09-06 NOTE — Progress Notes (Signed)
Medical screening examination/treatment was performed by qualified clinical staff member and as supervising physician I was immediately available for consultation/collaboration. I have reviewed documentation and agree with assessment and plan.  Myriah Boggus R Bari Handshoe, MD  

## 2018-09-06 NOTE — Telephone Encounter (Signed)
Author phoned sister again to relay information via cell, but no answer, generic VM box set-up. Author tried home number, but pt. picked up. Author relayed again to pt. no need for CT at this time, which pt. sounded surprised by, and author reassured her that urine showed no signs of UTI. Author reiterated to follow-up with Dr. Tomi Likens on 3/11. Pt. Verbalized understanding.
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
## 2018-09-13 DIAGNOSIS — M7918 Myalgia, other site: Secondary | ICD-10-CM | POA: Diagnosis not present

## 2018-09-13 DIAGNOSIS — M5441 Lumbago with sciatica, right side: Secondary | ICD-10-CM | POA: Diagnosis not present

## 2018-09-13 DIAGNOSIS — G8929 Other chronic pain: Secondary | ICD-10-CM | POA: Diagnosis not present

## 2018-09-13 DIAGNOSIS — M533 Sacrococcygeal disorders, not elsewhere classified: Secondary | ICD-10-CM | POA: Diagnosis not present

## 2018-09-13 DIAGNOSIS — G894 Chronic pain syndrome: Secondary | ICD-10-CM | POA: Diagnosis not present

## 2018-09-13 DIAGNOSIS — Z9189 Other specified personal risk factors, not elsewhere classified: Secondary | ICD-10-CM | POA: Diagnosis not present

## 2018-09-14 ENCOUNTER — Other Ambulatory Visit: Payer: Self-pay

## 2018-09-14 ENCOUNTER — Encounter

## 2018-09-14 ENCOUNTER — Encounter: Payer: Self-pay | Admitting: Neurology

## 2018-09-14 ENCOUNTER — Ambulatory Visit (INDEPENDENT_AMBULATORY_CARE_PROVIDER_SITE_OTHER): Payer: Medicare Other | Admitting: Neurology

## 2018-09-14 VITALS — BP 146/78 | HR 89 | Temp 98.7°F | Ht 69.0 in | Wt 274.0 lb

## 2018-09-14 DIAGNOSIS — G40909 Epilepsy, unspecified, not intractable, without status epilepticus: Secondary | ICD-10-CM

## 2018-09-14 DIAGNOSIS — R4189 Other symptoms and signs involving cognitive functions and awareness: Secondary | ICD-10-CM | POA: Diagnosis not present

## 2018-09-14 DIAGNOSIS — R413 Other amnesia: Secondary | ICD-10-CM | POA: Diagnosis not present

## 2018-09-14 DIAGNOSIS — G43009 Migraine without aura, not intractable, without status migrainosus: Secondary | ICD-10-CM

## 2018-09-14 NOTE — Addendum Note (Signed)
Addended by: Clois Comber on: 09/14/2018 02:08 PM   Modules accepted: Orders

## 2018-09-14 NOTE — Patient Instructions (Addendum)
1.  Check MRI of brain without contrast 2.  Refer to [facility] for neurocognitive testing 3.  No driving 4.  Follow up with me after testing.  We have sent a referral to [facility] for your MRI and they will call you directly to schedule your appt. They are located at [place]. If you need to contact them directly please call [phone].

## 2018-09-22 ENCOUNTER — Telehealth: Payer: Self-pay | Admitting: Neurology

## 2018-09-22 NOTE — Telephone Encounter (Signed)
Patient wants to talk to someone she states she is suppose to have a CT scan but she is not sure

## 2018-09-23 NOTE — Telephone Encounter (Signed)
Called and LMOVM for Pt to return call 

## 2018-09-27 NOTE — Telephone Encounter (Signed)
Called and spoke with Pt. I advised her the test was an MRI, but at this time imaging studies other than emergencies are on hold. Pt understands she will be scheduled at a later date.

## 2018-10-10 ENCOUNTER — Ambulatory Visit: Payer: Medicare Other | Admitting: Neurology

## 2018-10-28 ENCOUNTER — Other Ambulatory Visit: Payer: Self-pay

## 2018-10-28 ENCOUNTER — Telehealth: Payer: Self-pay | Admitting: Neurology

## 2018-10-28 MED ORDER — METOPROLOL SUCCINATE ER 50 MG PO TB24: 50.0000 mg | ORAL_TABLET | Freq: Every day | ORAL | 1 refills | Status: DC

## 2018-10-28 NOTE — Telephone Encounter (Signed)
Patient is needing both her inhalers sent to the pharm on file for a refill. Thanks!

## 2018-10-31 ENCOUNTER — Telehealth: Payer: Self-pay | Admitting: Neurology

## 2018-10-31 ENCOUNTER — Other Ambulatory Visit: Payer: Self-pay | Admitting: Family Medicine

## 2018-10-31 DIAGNOSIS — F250 Schizoaffective disorder, bipolar type: Secondary | ICD-10-CM | POA: Diagnosis not present

## 2018-10-31 NOTE — Telephone Encounter (Signed)
Called and advised Pt to contact Dr. Annamaria Boots

## 2018-10-31 NOTE — Telephone Encounter (Signed)
Patient called regarding a referral to [facility] for Neuro testing.  She said she has not heard from anyone regarding the Referral to schedule. Please Call. Thanks

## 2018-10-31 NOTE — Telephone Encounter (Signed)
Called and advised referral has been faxed

## 2018-11-01 ENCOUNTER — Telehealth: Payer: Self-pay | Admitting: Family Medicine

## 2018-11-01 NOTE — Telephone Encounter (Unsigned)
Copied from [place] [id number]. Topic: Quick Communication - Rx Refill/Question >> Nov 01, 2018  2:49 PM Celene Kras A wrote: Medication: metoprolol succinate (TOPROL-XL) 50 MG 24 hr tablet  Has the patient contacted their pharmacy? Yes.  Pt states the pharmacy has not gotten a request for this medication. Pt states she has been out of medication for 4-6 days. Please advise.  (Agent: If no, request that the patient contact the pharmacy for the refill.) (Agent: If yes, when and what did the pharmacy advise?)  Preferred Pharmacy (with phone number or street name): [facility] [place], [address] [place] [address] Phone: [phone] Fax: [phone] Open 24 hours    Agent: Please be advised that RX refills may take up to 3 business days. We ask that you follow-up with your pharmacy.

## 2018-11-03 NOTE — Telephone Encounter (Signed)
Rx was refilled and sent to pt pharmacy on 10/28/2018

## 2018-11-03 NOTE — Telephone Encounter (Signed)
Referral would not go through, mailed referral.

## 2018-11-08 ENCOUNTER — Telehealth: Payer: Self-pay | Admitting: Neurology

## 2018-11-08 NOTE — Telephone Encounter (Signed)
Called and spoke with Pt, advised her I had to mail the referral. I provided her with the number to [facility].

## 2018-11-08 NOTE — Telephone Encounter (Signed)
Patient called and would like to speak with you regarding a referral. Please Call. Thanks

## 2018-11-14 DIAGNOSIS — N6452 Nipple discharge: Secondary | ICD-10-CM | POA: Diagnosis not present

## 2018-11-21 ENCOUNTER — Telehealth: Payer: Self-pay | Admitting: Internal Medicine

## 2018-11-21 MED ORDER — FLUTICASONE-SALMETEROL 500-50 MCG/DOSE IN AEPB: INHALATION_SPRAY | RESPIRATORY_TRACT | 0 refills | Status: DC

## 2018-11-21 MED ORDER — ALBUTEROL SULFATE HFA 108 (90 BASE) MCG/ACT IN AERS: INHALATION_SPRAY | RESPIRATORY_TRACT | 0 refills | Status: DC

## 2018-11-21 NOTE — Telephone Encounter (Signed)
Yes, ok for both

## 2018-11-21 NOTE — Telephone Encounter (Signed)
Called and spoke with patient she has not been seen since 2015.  I made her an appt with Dr. Annamaria Boots for June 10, may patient have 1 refill on advair and proair to get her to that appt?   Dr. Annamaria Boots please advise  Allergies  Allergen Reactions  . Bee Venom Anaphylaxis  . Depakote [Divalproex Sodium] Shortness Of Breath and Rash  . Haloperidol Lactate Swelling  . Latex Anaphylaxis    Other reaction(s): mild rash/itching, wheezing/sob  . Peanut-Containing Drug Products Anaphylaxis  . Penicillins Anaphylaxis and Swelling    Other reaction(s): Other (See Comments)  . Quetiapine Anaphylaxis and Other (See Comments)  . Shellfish-Derived Products Anaphylaxis  . Valproic Acid Swelling    Other reaction(s): unknown  . Amitriptyline Hcl Other (See Comments)    REACTION: HALLUCINATIONS  . Butorphanol Tartrate Other (See Comments)    REACTION: Hallucinations  . Gabapentin Other (See Comments)    Pt reports severe back and side pain  . Hornet Venom   . Iodine     REACTION: Flushing and fainting  . Terbutaline Other (See Comments)  . Pregabalin Rash   Current Outpatient Medications on File Prior to Visit  Medication Sig Dispense Refill  . ADVAIR DISKUS 500-50 MCG/DOSE AEPB USE ONE INHALATION BY MOUTH EVERY 12 HOURS FOR ASTHMA 60 each 5  . benztropine (COGENTIN) 0.5 MG tablet Take 5 mg by mouth daily.    . cyclobenzaprine (FLEXERIL) 10 MG tablet Take 10 mg by mouth 3 (three) times daily as needed.  1  . doxepin (SINEQUAN) 10 MG capsule Take 20 mg by mouth at bedtime.  2  . EPINEPHrine (EPI-PEN) 0.3 mg/0.3 mL DEVI Inject 0.3 mg into the muscle once. As needed for severe allergic reaction    . famotidine (PEPCID) 20 MG tablet Take 1 tablet (20 mg total) by mouth 2 (two) times daily. For stomach acid. 60 tablet 0  . FLUoxetine (PROZAC) 20 MG capsule Take 20 mg by mouth daily.    . hydrOXYzine (ATARAX/VISTARIL) 25 MG tablet Take 25 mg by mouth 3 (three) times daily.  2  . lamoTRIgine (LAMICTAL) 25  MG tablet Take 25 mg by mouth at bedtime.     . metoprolol succinate (TOPROL-XL) 50 MG 24 hr tablet Take 1 tablet (50 mg total) by mouth daily. Take with or immediately following a meal. 90 tablet 1  . Multiple Vitamin (MULTIVITAMIN WITH MINERALS) TABS Take 1 tablet by mouth daily. For nutritional supplementation. 30 tablet 0  . polyethylene glycol (MIRALAX / GLYCOLAX) packet Take 17 g by mouth daily as needed for mild constipation. 14 each 0  . PROAIR HFA 108 (90 BASE) MCG/ACT inhaler INHALE 2 PUFFS BY MOUTH EVERY 4 HOURS AS NEEDED FOR WHEEZING OR SHORTNESS OF BREATH 8.5 g 5  . traZODone (DESYREL) 150 MG tablet Take 150 mg by mouth at bedtime.    Marland Kitchen trifluoperazine (STELAZINE) 2 MG tablet Take 2 mg by mouth at bedtime.     No current facility-administered medications on file prior to visit.

## 2018-11-21 NOTE — Telephone Encounter (Signed)
Called spoke with patient let her know we would be sending [facility], She would also need to keep and attend her appointment in June with Dr. Annamaria Boots to receive more refills.   Nothing further at this time.

## 2018-11-23 DIAGNOSIS — R413 Other amnesia: Secondary | ICD-10-CM | POA: Diagnosis not present

## 2018-12-01 DIAGNOSIS — G8929 Other chronic pain: Secondary | ICD-10-CM | POA: Diagnosis not present

## 2018-12-01 DIAGNOSIS — M5441 Lumbago with sciatica, right side: Secondary | ICD-10-CM | POA: Diagnosis not present

## 2018-12-01 DIAGNOSIS — Z9189 Other specified personal risk factors, not elsewhere classified: Secondary | ICD-10-CM | POA: Diagnosis not present

## 2018-12-01 DIAGNOSIS — M533 Sacrococcygeal disorders, not elsewhere classified: Secondary | ICD-10-CM | POA: Diagnosis not present

## 2018-12-01 DIAGNOSIS — M7918 Myalgia, other site: Secondary | ICD-10-CM | POA: Diagnosis not present

## 2018-12-01 DIAGNOSIS — G894 Chronic pain syndrome: Secondary | ICD-10-CM | POA: Diagnosis not present

## 2018-12-06 ENCOUNTER — Other Ambulatory Visit: Payer: Self-pay | Admitting: Family Medicine

## 2018-12-06 NOTE — Telephone Encounter (Signed)
Rx was filled on 10/25/2018, 90 tabs with 1 refill

## 2018-12-08 DIAGNOSIS — F319 Bipolar disorder, unspecified: Secondary | ICD-10-CM | POA: Diagnosis not present

## 2018-12-08 DIAGNOSIS — R413 Other amnesia: Secondary | ICD-10-CM | POA: Diagnosis not present

## 2018-12-14 ENCOUNTER — Institutional Professional Consult (permissible substitution): Payer: Medicare Other | Admitting: Internal Medicine

## 2018-12-19 ENCOUNTER — Encounter: Payer: Self-pay | Admitting: Neurology

## 2018-12-19 DIAGNOSIS — R102 Pelvic and perineal pain: Secondary | ICD-10-CM | POA: Insufficient documentation

## 2018-12-19 DIAGNOSIS — N643 Galactorrhea not associated with childbirth: Secondary | ICD-10-CM | POA: Insufficient documentation

## 2018-12-19 DIAGNOSIS — Z6841 Body Mass Index (BMI) 40.0 and over, adult: Secondary | ICD-10-CM | POA: Insufficient documentation

## 2018-12-19 DIAGNOSIS — I519 Heart disease, unspecified: Secondary | ICD-10-CM | POA: Insufficient documentation

## 2018-12-19 DIAGNOSIS — E669 Obesity, unspecified: Secondary | ICD-10-CM | POA: Insufficient documentation

## 2018-12-19 DIAGNOSIS — N736 Female pelvic peritoneal adhesions (postinfective): Secondary | ICD-10-CM | POA: Insufficient documentation

## 2018-12-19 DIAGNOSIS — G43909 Migraine, unspecified, not intractable, without status migrainosus: Secondary | ICD-10-CM | POA: Insufficient documentation

## 2018-12-19 DIAGNOSIS — F4321 Adjustment disorder with depressed mood: Secondary | ICD-10-CM | POA: Insufficient documentation

## 2018-12-19 HISTORY — DX: Heart disease, unspecified: I51.9

## 2018-12-19 HISTORY — DX: Female pelvic peritoneal adhesions (postinfective): N73.6

## 2018-12-19 HISTORY — DX: Pelvic and perineal pain: R10.2

## 2018-12-19 HISTORY — DX: Body Mass Index (BMI) 40.0 and over, adult: Z684

## 2018-12-19 HISTORY — DX: Morbid (severe) obesity due to excess calories: E66.01

## 2018-12-19 HISTORY — DX: Galactorrhea not associated with childbirth: N64.3
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
## 2018-12-20 ENCOUNTER — Encounter: Payer: Self-pay | Admitting: Neurology

## 2018-12-20 ENCOUNTER — Other Ambulatory Visit: Payer: Self-pay

## 2018-12-20 ENCOUNTER — Telehealth (INDEPENDENT_AMBULATORY_CARE_PROVIDER_SITE_OTHER): Payer: Medicare Other | Admitting: Neurology

## 2018-12-20 VITALS — Ht 69.0 in | Wt 201.0 lb

## 2018-12-20 DIAGNOSIS — G40909 Epilepsy, unspecified, not intractable, without status epilepticus: Secondary | ICD-10-CM | POA: Diagnosis not present

## 2018-12-20 DIAGNOSIS — G43009 Migraine without aura, not intractable, without status migrainosus: Secondary | ICD-10-CM

## 2018-12-20 DIAGNOSIS — G35 Multiple sclerosis: Secondary | ICD-10-CM

## 2018-12-22 ENCOUNTER — Encounter: Payer: Self-pay | Admitting: Internal Medicine

## 2018-12-22 ENCOUNTER — Ambulatory Visit (INDEPENDENT_AMBULATORY_CARE_PROVIDER_SITE_OTHER): Payer: Medicare Other | Admitting: Internal Medicine

## 2018-12-22 ENCOUNTER — Other Ambulatory Visit: Payer: Self-pay

## 2018-12-22 DIAGNOSIS — J4541 Moderate persistent asthma with (acute) exacerbation: Secondary | ICD-10-CM | POA: Diagnosis not present

## 2018-12-22 DIAGNOSIS — G4733 Obstructive sleep apnea (adult) (pediatric): Secondary | ICD-10-CM

## 2018-12-22 MED ORDER — FLUTICASONE-SALMETEROL 250-50 MCG/DOSE IN AEPB: INHALATION_SPRAY | RESPIRATORY_TRACT | 12 refills | Status: DC

## 2018-12-22 MED ORDER — IPRATROPIUM-ALBUTEROL 0.5-2.5 (3) MG/3ML IN SOLN: RESPIRATORY_TRACT | 12 refills | Status: DC

## 2018-12-22 MED ORDER — ALBUTEROL SULFATE HFA 108 (90 BASE) MCG/ACT IN AERS: INHALATION_SPRAY | RESPIRATORY_TRACT | 12 refills | Status: DC

## 2018-12-22 NOTE — Assessment & Plan Note (Signed)
Oxygen was started for nocturnal hypoxemia as best compromise

## 2018-12-22 NOTE — Patient Instructions (Signed)
Advair was changed to the 250-50 strength and refilled along with refills for the nebulizer solution and the Proair rescue inhaler.  Please call if we can help

## 2018-12-22 NOTE — Progress Notes (Signed)
Subjective:    Patient ID: [name], female    DOB: 08/26/1960, 58 y.o.   MRN: [id number] HPI F former smoker followed for asthma and allergic rhinitis, complicated by GERD, depression, VCD, seizure disorder PFT 07/05/08- FVC 3.51/ 88%, FEV1 2.38/ 78%, R 0.68, 25-75% 1.45/ 44%, TLC 83%, DLCO 68% Allergy Profile 01/18/2013-total IgE 242.4 with broad elevations for common inhalant allergens. -----------------------------------------------------------------------------------  11/14/13-  52 yoF former smoker followed for asthma and allergic rhinitis, complicated by GERD, depression, VCD, seizure disorcder FOLLOWS FOR: runny eyes- worse in April, improving now; otherwise no flare ups at this time. Does wheeze but feels controlled. Infrequent need for rescue inhaler. Had 2 seizures on Keppra and was told to ask [place] to check A1c and TSH while she was here.  12/22/2018- 79 yoF  former smoker followed for asthma and allergic rhinitis, complicated by GERD, depression, VCD, seizure disorder O2 2L sleep/ [facility] Advair 500, ProAir hfa, Epipen, ------last seen in 2015, pt states breathing has not been stable, having lots of wheezing  She tells me she had done very well fora long time, out of her inhaled meds. Then she cleaned dusty closets using Lysol spray and had flare for which she called requesting meds till this appointment. Now feels almost back to baseline. Asthma not disturbing sleep.  Has slept w O2 2L for years. Aware of seasonal allergy triggers- pollens.Denies heart problems, edema, palpitation, fever.  Activity limited by herniated disk- rolling walker.  CXR 01/09/18- Lungs are clear. Heart size and pulmonary vascularity are normal. No adenopathy. No bone lesions. IMPRESSION: No edema or consolidation.  ROS-see HPI  + = positive Constitutional:   No-   weight loss, night sweats, fevers, chills, fatigue, lassitude. HEENT:   No-  headaches, difficulty swallowing, tooth/dental problems,  sore throat,       +sneezing, itching, ear ache, nasal congestion, post nasal drip,  CV:  No-   chest pain, orthopnea, PND, swelling in lower extremities, anasarca, dizziness, palpitations Resp: No-   shortness of breath with exertion or at rest.              No-   productive cough,  No non-productive cough,  No- coughing up of blood.              No-   change in color of mucus.  No- wheezing now.   Skin: + rash per HPI GI:  No-   heartburn, indigestion, abdominal pain, nausea, vomiting,  GU:  MS:  No-   joint pain or swelling.   Neuro-     nothing unusual Psych:  No- change in mood or affect. No depression or anxiety.  No memory loss.  Objective:  OBJ- Physical Exam General- Alert, Oriented, Affect-appropriate, Distress- none acute. + obese Skin- rash-none, lesions- none, excoriation- none Lymphadenopathy- none Head- atraumatic            Eyes- Gross vision intact, PERRLA, conjunctivae and secretions clear            Ears- Hearing, canals-normal            Nose- Clear, no-Septal dev, mucus, polyps, erosion, perforation             Throat- Mallampati II-III , mucosa clear , drainage- none, tonsils- atrophic Neck- flexible , trachea midline, no stridor , thyroid nl, carotid no bruit Chest - symmetrical excursion , unlabored           Heart/CV- RRR , no murmur , no gallop  ,  no rub, nl s1 s2                           - JVD- none , edema- none, stasis changes- none, varices- none           Lung- clear to P&A, wheeze- none, cough- none , dullness-none, rub- none           Chest wall-  Abd-  Br/ Gen/ Rectal- Not done, not indicated Extrem- cyanosis- none, clubbing, none, atrophy- none, strength- nl.  Neuro- grossly intact to observation  Assessment & Plan:

## 2018-12-22 NOTE — Assessment & Plan Note (Signed)
Current exacerbation was provoked by cleaning closets when out of her meds. Plan- Med education. Change Advair to 250, refills.

## 2018-12-26 ENCOUNTER — Telehealth: Payer: Self-pay | Admitting: Neurology

## 2018-12-26 NOTE — Telephone Encounter (Signed)
PT called looking to schedule MRI that Dr. Tomi Likens mentioned on her last visit. Pls call her.

## 2018-12-27 NOTE — Telephone Encounter (Signed)
Spoke with patient regarding her MRI.  She will call [facility] to schedule appointment.

## 2019-01-17 DIAGNOSIS — G894 Chronic pain syndrome: Secondary | ICD-10-CM | POA: Diagnosis not present

## 2019-01-17 DIAGNOSIS — Z9189 Other specified personal risk factors, not elsewhere classified: Secondary | ICD-10-CM | POA: Diagnosis not present

## 2019-01-17 DIAGNOSIS — M533 Sacrococcygeal disorders, not elsewhere classified: Secondary | ICD-10-CM | POA: Diagnosis not present

## 2019-01-17 DIAGNOSIS — M5441 Lumbago with sciatica, right side: Secondary | ICD-10-CM | POA: Diagnosis not present

## 2019-01-17 DIAGNOSIS — M7918 Myalgia, other site: Secondary | ICD-10-CM | POA: Diagnosis not present

## 2019-01-17 DIAGNOSIS — G8929 Other chronic pain: Secondary | ICD-10-CM | POA: Diagnosis not present

## 2019-01-19 ENCOUNTER — Other Ambulatory Visit: Payer: Self-pay

## 2019-01-19 ENCOUNTER — Ambulatory Visit
Admission: RE | Admit: 2019-01-19 | Discharge: 2019-01-19 | Disposition: A | Discharge status: Home / Self Care | Payer: Medicare Other | Source: Ambulatory Visit | Attending: Neurology | Admitting: Neurology

## 2019-01-19 DIAGNOSIS — R413 Other amnesia: Secondary | ICD-10-CM

## 2019-01-19 DIAGNOSIS — R4189 Other symptoms and signs involving cognitive functions and awareness: Secondary | ICD-10-CM

## 2019-01-20 ENCOUNTER — Telehealth: Payer: Self-pay | Admitting: Internal Medicine

## 2019-01-20 DIAGNOSIS — J4540 Moderate persistent asthma, uncomplicated: Secondary | ICD-10-CM

## 2019-01-20 NOTE — Telephone Encounter (Signed)
PA was denied. Appeal has been started on covermymeds. Will update when have one.

## 2019-01-20 NOTE — Telephone Encounter (Signed)
Medication name and strength: ipratropium-albuterol 0.5-2.5 (3mg /71ml) sol Provider: [place]: [facility] Patient insurance ID: [id number] Phone: [phone] Fax: [phone]  Was the PA started on CMM?  yes If yes, please enter the Key: [id number] Timeframe for approval/denial: Your information has been submitted to [place] [employer] Part D. [employer] will review the request and will issue a decision, typically within 1-3 days from your submission. You can check the updated outcome later by reopening this request.  If [employer] has not responded in 1-3 days or if you have any questions about your ePA request, please contact [place] [employer] Part [employer] at [phone]. If you think there may be a problem with your PA request, use our live chat feature at the bottom right.  Routing to [employer] to follow up on.

## 2019-01-23 ENCOUNTER — Telehealth: Payer: Self-pay

## 2019-01-23 NOTE — Telephone Encounter (Signed)
Left message informing patient of results. °

## 2019-01-23 NOTE — Telephone Encounter (Signed)
Received the following fax from [place] from 01/20/2019 at 13:34:  Redetermination Notice Denial of Medicare Prescription Drug Coverage for IPRATROPIUM-ALBUTEROL solution.   CY reviewed this and verbally ordered to establish patient with a local DME for this medication.  I attempted to call patient with this information. At the time of the call, the line went to voicemail. I have left a message for patient to call back.   I am also pending orders for ambulatory referral for DME to get patient established. Once she is established, we will print a prescription for ipratropium-albuterol and fax it to said home care company.

## 2019-01-23 NOTE — Telephone Encounter (Signed)
-----   Message from Pieter Partridge, DO sent at 01/20/2019  1:13 PM EDT ----- MRI of brain largely unremarkable.

## 2019-01-25 DIAGNOSIS — F250 Schizoaffective disorder, bipolar type: Secondary | ICD-10-CM | POA: Diagnosis not present

## 2019-01-25 NOTE — Telephone Encounter (Signed)
ATC pt regarding the telephone message below. At the time of the call, the line went to voicemail. I have left a message for patient to call back X2.   Orders are still pended. Will attempt to contact patient again.

## 2019-01-26 NOTE — Telephone Encounter (Signed)
Called  & spoke w/ pt to let her know that her ipratropium-albuterol nebulizer solution has been denied by her insurance twice. I further included that Dr. Annamaria Boots would like to try establishing her with a local home care company in order to get her medication covered. Pt expressed understanding and agreed with CY's recommendations. I let her know I am placing the order for DME establishment. Pt verbalized understanding with no additional questions. Nothing further needed at this time.

## 2019-01-27 ENCOUNTER — Telehealth: Payer: Self-pay

## 2019-01-27 ENCOUNTER — Telehealth: Payer: Self-pay | Admitting: Internal Medicine

## 2019-01-27 NOTE — Telephone Encounter (Signed)
Call returned to patient, she reports she was returning a call from a left message. I made her aware that it appears she has already spoke to [place] in regards to her nebulizer so it may be an old message. She did report she already has a nebulizer machine she just needs to medication. I made her aware that I do see in the referral notes where the medication was sent to [facility] however I would have our [facility] team note in the order that she does not need the nebulizer machine, just the medication filled.   [facility] can you ladies let [facility] know she does not need a nebulizer, just the medication filled through their pharmacy. Thanks.

## 2019-01-27 NOTE — Telephone Encounter (Signed)
Order is only for meds.  Nebulizer is not requested.  Holly sent Rx to [place] [facility] yesterday evening.  Nothing further needed.

## 2019-01-27 NOTE — Telephone Encounter (Signed)
Pt called and was transferred to me for MRI results. Pt wanted to make appointment.

## 2019-04-12 DIAGNOSIS — F315 Bipolar disorder, current episode depressed, severe, with psychotic features: Secondary | ICD-10-CM | POA: Diagnosis not present

## 2019-04-12 DIAGNOSIS — F250 Schizoaffective disorder, bipolar type: Secondary | ICD-10-CM | POA: Diagnosis not present

## 2019-04-24 ENCOUNTER — Ambulatory Visit: Payer: Medicare Other | Admitting: Neurology

## 2019-04-27 ENCOUNTER — Other Ambulatory Visit: Payer: Self-pay | Admitting: Family Medicine

## 2019-04-27 NOTE — Telephone Encounter (Signed)
Medication Refill - Medication: metoprolol succinate (TOPROL-XL) 50 MG 24 hr tablet   Has the patient contacted their pharmacy? Yes.   (Agent: If no, request that the patient contact the pharmacy for the refill.) (Agent: If yes, when and what did the pharmacy advise?)  Preferred Pharmacy (with phone number or street name): [facility] [id number] - [place], [place]: Please be advised that RX refills may take up to 3 business days. We ask that you follow-up with your pharmacy.

## 2019-05-01 NOTE — Progress Notes (Signed)
NEUROLOGY FOLLOW UP OFFICE NOTE  [name] [id number]  HISTORY OF PRESENT ILLNESS: [name] is a 58 year oldright-handed African American woman with seizure disorder, questionable multiple sclerosis, fibromyalgia, Bipolar disorder, depression and OSA who follows up for seizure disorder, migraines and questionable multiple sclerosis.  She is accompanied by a companion who supplements history.  UPDATE: She has falls when she steps into the bathtub. Otherwise stable.   HISTORY: I SEIZURE DISORDER: She has had history of seizures since childhood. Previously took phenobarbital until age 7. She was seizure-free for years until December 2014, in which she had 2 suspected seizures followed by a third seizure in February 2015. She was noted by her sister to be unresponsive,shaking with eyes rolled backwith tongue biting lasting 3 minutes. No bowel or bladder incontinence. Afterwards, she was confused and agitated. She was started on Keppra 500mg  twice daily. No recurrent seizures since 2015.  Keppra was subsequently discontinued after several years.  She had a repeat MRI of brain on 01/20/2019 which demonstrated mild generalized atrophy and again mild nonspecific cerebral white matter disease, similar to prior MRI from 10/18/2013.   II QUESTIONABLE HISTORY OF MULTIPLE SCLEROSIS, UNLIKELY: [place], unsteady gait and difficulty with fine motor skills. She took Betaseron until 2005. MRI of brain with and without contrast from 11/03/04 reportedly demonstrated "no lesion indentified in the brain which would be characteristic for multiple sclerosis."MRI of brain with and without contrast from 01/29/11 was personally reviewed and demonstrated mild non-enhancing nonspecific punctate T2 hyperintensities in the subcortical white matter, mildly increased on repeat study on 10/18/13.She was admitted to [facility] on 09/11/16 for bilateral lower extremity weakness with LLQ abdominal pain and nausea. MRI of cervical, thoracic and lumbar spine were personally reviewed and demonstrated no demyelinating spinal cord disease with mild disc bulging but no significant spinal stenosis. HIV testingwas negative.  III SUBJECTIVE MEMORY DEFICITS: She started having short-term memory problems in December 2019, however I presume symptoms started earlier. At the time, her family noticed the problem. She will lose her train of thought mid-sentence or she will forget why she walked into a room. She is told that she keeps repeating questions. She lives with her sister. Her sister took away her car keys because she got confused driving on a familiar route. One time, she gave a cab driver an extra [id number]. She is now having trouble paying her bills and requires help from her sister. She reports needing help bathing and getting dress. She started putting on clothes backwards. Her daughter got married in Spring 2019 and she cannot remember the wedding. She reports hallucinations. She woke up from a dream and turned over to briefly find a baby in her bed.  She underwent neuropsychological testing at [facility] on 11/23/18.  She did demonstrated reduced effort but no clear evidence of Alzheimer's disease or impairment often associated with MS.  Likely subjective memory deficits related to psychogenic factors but ultimately unclear.  Labs from 07/20/18 include B12 479, folate 17.8, TSH 0.43. CBC and CMP were unremarkable.  Both of her parents had Alzheimer's disease.   IV HEADACHES: She has history of migraines but headaches started getting worse in September 2019. She reports severe frontal or temporal pressure pain. No preceding aura. There is associated nausea, photophobia but no vomiting, phonophobia, visual disturbance or unilateral numbness or weakness. They usually last 3 to 4 hours. They are infrequent and  she hasn't  had one in several months. She usually treats it with Motrin and Flexeril.  Current NSAIDs:Motrin Current analgesics: none Current muscle relaxant: Flexeril Current antihypertensive: metoprolol Current antidepressant: Doxepin 20mg  at bedtime, Prozac Current antiepileptic: Lamotrigine 25mg  at bedtime Current sleep aid: Trazodone Other medications: Trazodone, Stelazine, Cogentin  Past antiemetic: Reglan Past muscle relaxant: Robaxin Past antidepressant: Cymbalta, amitriptyline Past antiepileptic: topiramate 100mg  twice daily, gabapentin Other past medications: Latuda, Seroquel, Sonata, Ambien  PAST MEDICAL HISTORY: Past Medical History:  Diagnosis Date   Anemia    Anxiety state, unspecified    Arthritis    Arthropathy, unspecified, site unspecified    Asthma    Backache, unspecified    Bipolar disorder (HCC)    Depressive disorder, not elsewhere classified    Esophageal reflux    Fibromyalgia    Hypertension    MS (multiple sclerosis) ([place])    remission at present(past hx. bedridden) -ambulates with cane-left side weaker   Obstructive sleep apnea (adult) (pediatric)    no cpap-uses Oxygen nightly 2l/m    On home oxygen therapy 05-19-13   nightly at 2l/m   Other abnormal glucose    05-19-13 no problems now   Seizures ([place])    last seizure 2014    MEDICATIONS: Current Outpatient Medications on File Prior to Visit  Medication Sig Dispense Refill   albuterol (PROAIR HFA) 108 (90 Base) MCG/ACT inhaler INHALE 2 PUFFS BY MOUTH EVERY 4 HOURS AS NEEDED FOR WHEEZING OR SHORTNESS OF BREATH 8.5 g 12   benztropine (COGENTIN) 0.5 MG tablet Take 5 mg by mouth daily.     cyclobenzaprine (FLEXERIL) 10 MG tablet Take 10 mg by mouth 3 (three) times daily as needed.  1   doxepin (SINEQUAN) 10 MG capsule Take 20 mg by mouth at bedtime.  2   EPINEPHrine (EPI-PEN) 0.3 mg/0.3 mL DEVI Inject 0.3 mg into the muscle once. As needed for severe  allergic reaction     famotidine (PEPCID) 20 MG tablet Take 1 tablet (20 mg total) by mouth 2 (two) times daily. For stomach acid. 60 tablet 0   FLUoxetine (PROZAC) 20 MG capsule Take 20 mg by mouth daily.     Fluticasone-Salmeterol (ADVAIR DISKUS) 250-50 MCG/DOSE AEPB Inhale 1 puf, then rinse mouth twice daily maintenance 60 each 12   hydrOXYzine (ATARAX/VISTARIL) 25 MG tablet Take 25 mg by mouth 3 (three) times daily.  2   ipratropium-albuterol (DUONEB) 0.5-2.5 (3) MG/3ML SOLN 1 neb every 6 hours if needed 75 mL 12   lamoTRIgine (LAMICTAL) 25 MG tablet Take 25 mg by mouth at bedtime.      metoprolol succinate (TOPROL-XL) 50 MG 24 hr tablet Take 1 tablet (50 mg total) by mouth daily. Take with or immediately following a meal. 90 tablet 1   Multiple Vitamin (MULTIVITAMIN WITH MINERALS) TABS Take 1 tablet by mouth daily. For nutritional supplementation. 30 tablet 0   polyethylene glycol (MIRALAX / GLYCOLAX) packet Take 17 g by mouth daily as needed for mild constipation. 14 each 0   traZODone (DESYREL) 150 MG tablet Take 150 mg by mouth at bedtime.     trifluoperazine (STELAZINE) 2 MG tablet Take 2 mg by mouth at bedtime.     No current facility-administered medications on file prior to visit.     ALLERGIES: Allergies  Allergen Reactions   Bee Venom Anaphylaxis   Depakote [Divalproex Sodium] Shortness Of Breath and Rash   Haloperidol Lactate Swelling   Latex Anaphylaxis    Other reaction(s): mild rash/itching, wheezing/sob  Peanut-Containing Drug Products Anaphylaxis   Penicillins Anaphylaxis and Swelling    Other reaction(s): Other (See Comments)   Quetiapine Anaphylaxis and Other (See Comments)   Shellfish-Derived Products Anaphylaxis   Valproic Acid Swelling    Other reaction(s): unknown   Amitriptyline Hcl Other (See Comments)    REACTION: HALLUCINATIONS   Butorphanol Tartrate Other (See Comments)    REACTION: Hallucinations   Gabapentin Other (See  Comments)    Pt reports severe back and side pain   Hornet Venom    Iodine     REACTION: Flushing and fainting   Terbutaline Other (See Comments)   Pregabalin Rash    FAMILY HISTORY: Family History  Problem Relation Age of Onset   Heart disease Father    Ovarian cancer Mother    Breast cancer Mother    Colon cancer Neg Hx    Esophageal cancer Neg Hx    Stomach cancer Neg Hx    Rectal cancer Neg Hx    SOCIAL HISTORY: Social History   Socioeconomic History   Marital status: Divorced    Spouse name: Not on file   Number of children: 3   Years of education: 12+   Highest education level: Some college, no degree  Occupational History   Occupation: Disabled  Scientist, product/process development strain: Somewhat hard   Food insecurity    Worry: Sometimes true    Inability: Sometimes true   Transportation needs    Medical: Yes    Non-medical: Yes  Tobacco Use   Smoking status: Former Smoker    Packs/day: 1.00    Years: 5.00    Pack years: 5.00    Quit date: 07/07/2007    Years since quitting: 11.8   Smokeless tobacco: Never Used   Tobacco comment: 06/18 pt states she was an occasional smoker  Substance and Sexual Activity   Alcohol use: No   Drug use: No   Sexual activity: Not Currently    Birth control/protection: Surgical    Comment: X 1 year  Lifestyle   Physical activity    Days per week: 0 days    Minutes per session: 0 min   Stress: Rather much  Relationships   Social connections    Talks on phone: More than three times a week    Gets together: Once a week    Attends religious service: More than 4 times per year    Active member of club or organization: No    Attends meetings of clubs or organizations: Never    Relationship status: Not on file   Intimate partner violence    Fear of current or ex partner: No    Emotionally abused: No    Physically abused: No    Forced sexual activity: No  Other Topics Concern   Not on file   Social History Narrative   Single, 3 children   Right handed   Associates degree   occ 1 cup daily      09/01/2018: Lives with sister, Rise Paganini, who helps manage medications, in 2 story house, and a pit bull   Has one daughter in [place], 2 sons in [place]; 7 grandchildren   Currently not exercising, but wants to return to water aerobics at [employer]: Constitutional: No fevers, chills, or sweats, no generalized fatigue, change in appetite Eyes: No visual changes, double vision, eye pain Ear, nose and throat: No hearing loss, ear pain, nasal congestion, sore throat Cardiovascular: No chest  pain, palpitations Respiratory:  No shortness of breath at rest or with exertion, wheezes GastrointestinaI: No nausea, vomiting, diarrhea, abdominal pain, fecal incontinence Genitourinary:  No dysuria, urinary retention or frequency Musculoskeletal:  No neck pain, back pain Integumentary: No rash, pruritus, skin lesions Neurological: as above Psychiatric: anxiety, depression Endocrine: No palpitations, fatigue, diaphoresis, mood swings, change in appetite, change in weight, increased thirst Hematologic/Lymphatic:  No purpura, petechiae. Allergic/Immunologic: no itchy/runny eyes, nasal congestion, recent allergic reactions, rashes  PHYSICAL EXAM: Blood pressure (!) 146/60, pulse (!) 109, height 5\' 9"  (1.753 m), weight 267 lb 9.6 oz (121.4 kg), SpO2 98 %.  General: No acute distress.  Patient appears well-groomed.   Head:  Normocephalic/atraumatic Eyes:  Fundi examined but not visualized Neck: supple, no paraspinal tenderness, full range of motion Heart:  Regular rate and rhythm Lungs:  Clear to auscultation bilaterally Back: No paraspinal tenderness Neurological Exam: alert and oriented to person, place, and time. Attention span and concentration intact, recent and remote memory intact, fund of knowledge intact.  Speech fluent and not dysarthric, language intact.  Some difficulty tracking  my finger.  Endorses reduced sensation to touch on the left side of her face (V1-V3)CN II-XII intact. Bulk and tone normal, muscle strength 5/5 throughout.  Sensation to light touch intact.  Deep tendon reflexes 2+ throughout, toes downgoing.  Finger to nose and heel to shin testing intact.  Cautious gait, ambulates with cane. Romberg negative.  IMPRESSION: 1.  Seizure disorder, stable 2.  Migraine without aura, without status migrainosus, not intractable 3.  MRI brain findings appear consistent with chronic small vessel ischemic changes.  I do not think she has Multiple Sclerosis. 4.  Subjective cognitive deficits.  No clear evidence of underlying neurocognitive disorder on neuropsychological testing.  However, I would like to repeat testing next year to evaluate for any changes.  PLAN: 1.  Check EEG.  If there is any evidence of epileptiform discharges, will restart Keppra 2.  Optimize stroke risk factors:   3.  Repeat neuropsychological testing in late May-June 4.  Follow up with me after repeat testing.  15 minutes spent face to face with patient, over 50% spent discussing diagnosis and management.   Metta Clines, DO  CC:  Grier Mitts, MD

## 2019-05-02 ENCOUNTER — Ambulatory Visit (INDEPENDENT_AMBULATORY_CARE_PROVIDER_SITE_OTHER): Payer: Medicare Other | Admitting: Neurology

## 2019-05-02 ENCOUNTER — Other Ambulatory Visit: Payer: Self-pay

## 2019-05-02 ENCOUNTER — Other Ambulatory Visit: Payer: Self-pay | Admitting: Family Medicine

## 2019-05-02 ENCOUNTER — Encounter: Payer: Self-pay | Admitting: Neurology

## 2019-05-02 VITALS — BP 146/60 | HR 109 | Ht 69.0 in | Wt 267.6 lb

## 2019-05-02 DIAGNOSIS — R4189 Other symptoms and signs involving cognitive functions and awareness: Secondary | ICD-10-CM | POA: Diagnosis not present

## 2019-05-02 DIAGNOSIS — G40909 Epilepsy, unspecified, not intractable, without status epilepticus: Secondary | ICD-10-CM | POA: Diagnosis not present

## 2019-05-02 DIAGNOSIS — G43009 Migraine without aura, not intractable, without status migrainosus: Secondary | ICD-10-CM

## 2019-05-02 NOTE — Telephone Encounter (Signed)
metoprolol succinate (TOPROL-XL) 50 MG 24 hr tablet    Patient is requesting refill.    Pharmacy:     [facility] [place], [address] [place] [phone] (Phone) [phone] (Fax)

## 2019-05-02 NOTE — Patient Instructions (Addendum)
1.  We will check an EEG to look for any evidence of increase risk for seizures.  If present, then I would restart levetiracetam.  2.  I would like to schedule another memory test here in our office (late-May or June, when it would be about one year from your previous one).  Follow up with me afterwards.  You have been referred for a neurocognitive evaluation in our office.   The evaluation has two parts.   . The first part of the evaluation is a clinical interview with the neuropsychologist (Dr. Melvyn Novas or Dr. Nicole Kindred). Please bring someone with you to this appointment if possible, as it is helpful for the doctor to hear from both you and another adult who knows you well.   . The second part of the evaluation is testing with the doctor's technician Hinton Dyer or Maudie Mercury). The testing includes a variety of tasks- mostly question-and-answer, some paper-and-pencil. There is nothing you need to do to prepare for this appointment, but having a good night's sleep prior to the testing, taking medications as you normally would, and bringing eyeglasses and hearing aids (if you wear them), is advised. Please make sure that you wear a mask to the appointment.  Please note: We have to reserve several hours of the neuropsychologist's time and the psychometrician's time for your evaluation appointment. As such, please note that there is a No-Show fee of $100. If you are unable to attend any of your appointments, please contact our office as soon as possible to reschedule.

## 2019-05-05 ENCOUNTER — Ambulatory Visit (INDEPENDENT_AMBULATORY_CARE_PROVIDER_SITE_OTHER): Payer: Medicare Other | Admitting: Neurology

## 2019-05-05 ENCOUNTER — Other Ambulatory Visit: Payer: Self-pay

## 2019-05-05 DIAGNOSIS — G40909 Epilepsy, unspecified, not intractable, without status epilepticus: Secondary | ICD-10-CM | POA: Diagnosis not present

## 2019-05-09 DIAGNOSIS — G8929 Other chronic pain: Secondary | ICD-10-CM | POA: Diagnosis not present

## 2019-05-09 DIAGNOSIS — M5441 Lumbago with sciatica, right side: Secondary | ICD-10-CM | POA: Diagnosis not present

## 2019-05-09 DIAGNOSIS — Z9189 Other specified personal risk factors, not elsewhere classified: Secondary | ICD-10-CM | POA: Diagnosis not present

## 2019-05-09 DIAGNOSIS — G894 Chronic pain syndrome: Secondary | ICD-10-CM | POA: Diagnosis not present

## 2019-05-09 DIAGNOSIS — M7918 Myalgia, other site: Secondary | ICD-10-CM | POA: Diagnosis not present

## 2019-05-09 DIAGNOSIS — M533 Sacrococcygeal disorders, not elsewhere classified: Secondary | ICD-10-CM | POA: Diagnosis not present

## 2019-05-12 ENCOUNTER — Telehealth: Payer: Self-pay

## 2019-05-12 NOTE — Procedures (Signed)
ELECTROENCEPHALOGRAM REPORT  Date of Study: 05/05/2019  Patient's Name: [name] MRN: [id number] Date of Birth: 04-15-1961  Referring Provider: Metta Clines, DO  Clinical History: 58 year old female with history of seizures since childhood.  Seizure-free until December 2014.  She had two subsequent seizures, the last in February 2015.  She has been off of antiepileptic drugs for several years.  Medications: PROAIR HFA 108 (90 Base) MCG/ACT inhaler COGENTIN 0.5 MG tablet FLEXERIL 10 MG tablet SINEQUAN 10 MG capsule EPI-PEN 0.3 mg/0.3 mL DEVI PEPCID 20 MG tablet PROZAC 20 MG capsule ADVAIR DISKUS 250-50 MCG/DOSE AEPB ATARAX/VISTARIL 25 MG tablet DUONEB 0.5-2.5 (3) MG/3ML SOLN LAMICTAL 25 MG tablet TOPROL-XL 50 MG 24 hr tablet MULTIVITAMIN WITH MINERALS TABS MIRALAX / GLYCOLAX packet DESYREL 150 MG tablet STELAZINE 2 MG tablet  Technical Summary: A multichannel digital EEG recording measured by the international 10-20 system with electrodes applied with paste and impedances below 5000 ohms performed in our laboratory with EKG monitoring in an awake and asleep patient.  Hyperventilation was not performed as patient is wearing a mask due to COVID-19 pandemic.  Photic stimulation was performed.  The digital EEG was referentially recorded, reformatted, and digitally filtered in a variety of bipolar and referential montages for optimal display.    Description: The patient is awake and asleep during the recording.  During maximal wakefulness, there is a symmetric, medium voltage 10 Hz posterior dominant rhythm that attenuates with eye opening.  The record is symmetric.  During drowsiness and sleep, there is an increase in theta slowing of the background.  Vertex waves and symmetric sleep spindles were seen.  Photic stimulation did not elicit any abnormalities.  There were no epileptiform discharges or electrographic seizures seen.    EKG lead was unremarkable.  Impression: This  awake and asleep EEG is normal.    Clinical Correlation: A normal EEG does not exclude a clinical diagnosis of epilepsy.  If further clinical questions remain, prolonged EEG may be helpful.  Clinical correlation is advised.   Metta Clines, DO

## 2019-05-12 NOTE — Telephone Encounter (Signed)
Called spoke with patient she was informed of provider response below

## 2019-05-12 NOTE — Telephone Encounter (Signed)
-----   Message from Pieter Partridge, DO sent at 05/12/2019  7:07 AM EST ----- Please let patient know that her EEG is normal.  Therefore, we will not restart Keppra.

## 2019-06-26 ENCOUNTER — Ambulatory Visit: Payer: Medicare Other | Admitting: Internal Medicine

## 2019-07-12 ENCOUNTER — Other Ambulatory Visit: Payer: Self-pay | Admitting: Family Medicine

## 2019-07-13 NOTE — Telephone Encounter (Signed)
Pt needs appointment for further refills 

## 2019-07-19 ENCOUNTER — Other Ambulatory Visit: Payer: Self-pay | Admitting: Internal Medicine

## 2019-07-19 MED ORDER — ALBUTEROL SULFATE HFA 108 (90 BASE) MCG/ACT IN AERS: INHALATION_SPRAY | RESPIRATORY_TRACT | 12 refills | Status: DC

## 2019-07-24 ENCOUNTER — Other Ambulatory Visit: Payer: Self-pay | Admitting: Internal Medicine

## 2019-07-24 MED ORDER — ALBUTEROL SULFATE HFA 108 (90 BASE) MCG/ACT IN AERS: INHALATION_SPRAY | RESPIRATORY_TRACT | 12 refills | Status: DC

## 2019-08-15 DIAGNOSIS — M533 Sacrococcygeal disorders, not elsewhere classified: Secondary | ICD-10-CM | POA: Diagnosis not present

## 2019-08-15 DIAGNOSIS — Z9189 Other specified personal risk factors, not elsewhere classified: Secondary | ICD-10-CM | POA: Diagnosis not present

## 2019-08-15 DIAGNOSIS — M5441 Lumbago with sciatica, right side: Secondary | ICD-10-CM | POA: Diagnosis not present

## 2019-08-15 DIAGNOSIS — G894 Chronic pain syndrome: Secondary | ICD-10-CM | POA: Diagnosis not present

## 2019-08-15 DIAGNOSIS — M7918 Myalgia, other site: Secondary | ICD-10-CM | POA: Diagnosis not present

## 2019-08-15 DIAGNOSIS — G8929 Other chronic pain: Secondary | ICD-10-CM | POA: Diagnosis not present

## 2019-09-01 DIAGNOSIS — R69 Illness, unspecified: Secondary | ICD-10-CM | POA: Diagnosis not present

## 2019-09-07 ENCOUNTER — Encounter: Payer: Self-pay | Admitting: Psychology

## 2019-09-07 ENCOUNTER — Ambulatory Visit (INDEPENDENT_AMBULATORY_CARE_PROVIDER_SITE_OTHER): Payer: Medicare Other | Admitting: Psychology

## 2019-09-07 ENCOUNTER — Other Ambulatory Visit: Payer: Self-pay

## 2019-09-07 ENCOUNTER — Ambulatory Visit: Payer: Self-pay

## 2019-09-07 DIAGNOSIS — F411 Generalized anxiety disorder: Secondary | ICD-10-CM

## 2019-09-07 DIAGNOSIS — G3184 Mild cognitive impairment, so stated: Secondary | ICD-10-CM

## 2019-09-07 DIAGNOSIS — F331 Major depressive disorder, recurrent, moderate: Secondary | ICD-10-CM

## 2019-09-07 DIAGNOSIS — F319 Bipolar disorder, unspecified: Secondary | ICD-10-CM

## 2019-09-07 DIAGNOSIS — G40909 Epilepsy, unspecified, not intractable, without status epilepticus: Secondary | ICD-10-CM

## 2019-09-07 DIAGNOSIS — G35 Multiple sclerosis: Secondary | ICD-10-CM

## 2019-09-07 NOTE — Progress Notes (Signed)
NEUROPSYCHOLOGICAL EVALUATION [place]  Reason for Referral:   [name] is a 59 y.o. right-handed African-American female referred by [name], D.O., to characterize her current cognitive functioning and assist with diagnostic clarity and treatment planning in the context of subjective cognitive decline, multiple sclerosis, seizure history, and several medical and psychiatric comorbidities.  Assessment and Plan:   Clinical Impression(s): Scores across stand-alone and embedded performance validity measures were variable, with several scores scoring below expectation. She also exhibited a very pronounced decline in verbal memory abilities (i.e., from average to exceptionally low performance) and verbal fluency compared to her prior neuropsychological evaluation. This decline would have occurred in a relatively short period of time (approximately 9-10 months) which is abnormal. As such, the results of the current evaluation should be interpreted with a mild degree of caution and may underestimate true abilities.  If taken at face value, [name] pattern of performance is suggestive of diffuse cognitive impairment, often in the well below average and exceptionally low normative ranges. Relative strengths were exhibited across basic attention and verbal abstract reasoning. However, all other scores were uniformly below expectation. During testing, deficits in receptive language (e.g., comprehending what is being stated to you) and processing speed appeared most salient and it is commonplace for deficits in these areas to create and/or worsen deficits in other cognitive domains. She also exhibited what appears to be visual acuity difficulties throughout testing, despite her reporting no change in her vision during interview. This could further negatively impact testing performance. Responses across mood-related questionnaires  suggested severe anxiety and moderate depression over the past 1-2 weeks, as well as mild sleep dysfunction. Overall, given report of some difficulties performing activities of daily living (ADLs) independently, [name] is believed to at least meet diagnostic criteria for a mild neurocognitive disorder of unclear etiology. She could certainly be trending towards a major neurocognitive disorder; however, this is difficult to determine given validity concerns surrounding test scores.   As mentioned above, the etiology of her deficits is unclear, both due to variable performance across validity scores and largely diffuse cognitive impairment. Ongoing psychiatric distress certainly can cause cognitive difficulties, especially in domains of processing speed, attention/concentration, and executive functioning. However, if current results are taken at face value, they would not create such profound deficits on their own. Deficits in these areas, as well as learning/retrieval aspects of memory, are also characteristic of cognitive dysfunction in multiple sclerosis ([name]). However, a [name] diagnosed would need to be supported by neurological evidence of CNS lesions which are enhancing over time. I cannot rule out an additional neurodegenerative process (i.e., outside of [name]) at the present time and [name] may be at an increased risk for an early-onset presentation given that she reported a history of dementia symptoms being present in family members around 58 years old. However, there were no patterns across current testing to suggest one particular type of degenerative process over another. Continued medical monitoring will be important moving forward.  Recommendations: If not already performed, [name] would benefit from being referred to a optometrist and/or neuro-ophthalmologist to assess for visual acuity changes/difficulties. Suspected visual impairment was apparent throughout the evaluation and is commonly  comorbid in individuals with [name]. For example, across mood-related questionnaires, she skipped many items unintentionally and often circled responses encompassing the bottom half of one answer and the top half of another answer.   A repeat neuropsychological evaluation in 12-18 months (or sooner  if functional decline is noted) is recommended to assess the trajectory of future cognitive decline should it occur, especially after her vision has been thoroughly assessed. This will also aid in future efforts towards improved diagnostic clarity.  A combination of medication and psychotherapy has been shown to be most effective at treating symptoms of anxiety and depression. As such, [name] is encouraged to speak with her prescribing physician regarding medication adjustments to optimally manage these symptoms. Likewise, [name] is encouraged continue her engagement in short-term psychotherapy to address symptoms of psychiatric distress. She would benefit from an active and collaborative therapeutic environment, rather than one purely supportive in nature. Recommended treatment modalities include Cognitive Behavioral Therapy (CBT) or Acceptance and Commitment Therapy (ACT).  Should there be a progression of her current deficits over time, [name] is unlikely to regain any independent living skills lost. Therefore, it is recommended that she remain as involved as possible in all aspects of household chores, finances, and medication management, with supervision to ensure adequate performance. She will likely benefit from the establishment and maintenance of a routine in order to maximize her functional abilities over time.  It will be important for [name] to have another person with her when in situations where she may need to process information, weigh the pros and cons of different options, and make decisions, in order to ensure that she fully understands and recalls all information to be  considered.  When learning new information, she would benefit from information being broken up into small, manageable pieces. She may also find it helpful to articulate the material in her own words and in a context to promote encoding at the onset of a new task. This material may need to be repeated multiple times to promote encoding. All important information should also be provided in written format.   To address problems with processing speed, she may wish to consider:   -Scheduling more difficult activities for a time of day when she is usually most alert   -Ensuring that she is alerted when essential material or instructions are being presented   -Allowing for more time in comprehending, processing, and responding in conversation  To address problems with fluctuating attention and executive dysfunction, she may wish to consider:   -Avoiding external distractions when needing to concentrate   -Limiting exposure to fast paced environments with multiple sensory demands   -Writing down complicated information and using checklists   -Attempting and completing one task at a time (i.e., no multi-tasking)   -Taking frequent breaks during the completion of steps/tasks to avoid fatigue   -Reducing the amount of information considered at one time  It will be important to have her paraphrase back information rather than simply repeat to allow those working with her to ensure she understands what is being asked of her and/or told to her.  Review of Records:   [name] completed a comprehensive neuropsychological evaluation Howard Pouch, Ph.D.) on 11/23/2018. Notably, this evaluation was conducted virtually rather than in a traditional face-to-face format due to the ongoing COVID-19 pandemic. Concerns about test validity were reported. For example, the sound quality over the telephone and occasional auditory lag made it difficult to understand some of the [name]'s answers. The examiner also  heard echoes of what was being said, potentially due to technical difficulties or there being another person in the room assisting [name] with testing procedures. Test results were said to require caution when interpreting. If taken at face value,  weaknesses were said to surround executive functioning, with performance variability exhibited across auditory comprehension. Overall, the etiology of cognitive weaknesses was said to be unclear and potentially related to her medical and psychiatric comorbidities. Memory testing did not suggest Alzheimer's disease and there was no reported evidence of cognitive inefficiency which is often seen in [name].   [name] was seen by [facility] [name], D.O.) on 05/02/2019 for follow-up of several conditions. Briefly, she has had a history of seizures since childhood. She previously took phenobarbital until age 66.She was seizure-free for many years until December 2014, where she had two suspected seizures followed by a third seizure in February 2015. She was noted by her sister to be unresponsive,shaking with eyes rolled backand tongue biting, lasting approximately 3 minutes. No bowel or bladder incontinence was noted. Afterwards, she was confused and agitated. She was started on Keppra 500mg  twice daily. No recurrent seizures since 2015 were reported. Keppra was subsequently discontinued after several years. Additionally, she was diagnosed with [name] in 2000 in [place] experiencing visual changes, unsteady gait, and difficulty with fine motor skills. She took Betaseron until 2005. Brain MRI with and without contrast from 11/03/2004 reportedly demonstrated "no lesion identified in the brain which would be characteristic for multiple sclerosis."BrainMRI with and without contrast from 01/29/2011 demonstrated mild non-enhancing nonspecific punctate T2 hyperintensities in the subcortical white matter, mildly increased on repeat study on 10/18/2013.She was  admitted to [facility] on 09/11/2016 for bilateral lower extremity weakness with LLQ abdominal pain and nausea. MRI of the cervical, thoracic and lumbar spine demonstrated no demyelinating spinal cord disease with mild disc bulging but no significant spinal stenosis.HIV testingwas negative.  Regarding cognitive dysfunction, she started having short-term memory problems in December 2019 (this is when the family noticed difficulties). She reported losing her train of thought mid-sentence or will forget why she walked into a room. She is also told by others that she asks repetitive questions.Her sister took away her car keys because [name] got confused driving on a familiar route.She also reported trouble paying her bills and requires help from her sister.She also reported not being able to remember her sister's wedding during Spring 2019. Ultimately, [name] was referred for a comprehensive neuropsychological evaluation to characterize her cognitive abilities and to assist with diagnostic clarity and treatment planning.   Past Medical History:  Diagnosis Date  . Anemia   . Arthritis   . Arthropathy, unspecified, site unspecified   . Asthma   . Backache, unspecified   . Bipolar I disorder 06/13/2008   Qualifier: Diagnosis of  By: Burt Knack CMA, Cecille Rubin    . Dysphagia, pharyngoesophageal phase 09/12/2008   Qualifier: Diagnosis of  By: Joya Gaskins MD, Burnett Harry   . Fibromyalgia   . Generalized anxiety disorder 04/05/2012  . GERD (gastroesophageal reflux disease) 07/05/2008   Qualifier: Diagnosis of  By: Joya Gaskins MD, Burnett Harry   . Heart disease 12/19/2018  . Hypertension   . Intractable migraine with aura 11/03/2013  . Lumbar radiculopathy 08/30/2012  . Major depressive disorder, recurrent, severe with psychotic features 04/05/2012  . Multiple sclerosis 11/03/2013   remission at present(past hx. bedridden) -ambulates with cane-left side weaker  . Obstructive sleep apnea 06/13/2008   no cpap-uses  Oxygen nightly 2l/m   . On home oxygen therapy    nightly at 2l/m  . Other abnormal glucose    05-19-13 no problems now  . Seizure disorder    last seizure 2014  . Thyroid disease 10/03/2013  Past Surgical History:  Procedure Laterality Date  . APPENDECTOMY    . BREAST LUMPECTOMY WITH NEEDLE LOCALIZATION Left 06/06/2015   Procedure: LEFT BREAST LUMPECTOMY WITH NEEDLE LOCALIZATION TIMES TWO;  Surgeon: Erroll Luna, MD;  Location: [place];  Service: General;  Laterality: Left;  . BREAST SURGERY    . CESAREAN SECTION  1988   twins  . CHOLECYSTECTOMY    . COLONOSCOPY N/A 06/12/2013   Procedure: COLONOSCOPY;  Surgeon: Irene Shipper, MD;  Location: [facility];  Service: Endoscopy;  Laterality: N/A;  . HERNIA REPAIR     umbilical  . TUBAL LIGATION    . VAGINAL HYSTERECTOMY      Current Outpatient Medications:  .  albuterol (PROAIR HFA) 108 (90 Base) MCG/ACT inhaler, INHALE 2 PUFFS BY MOUTH EVERY 4 HOURS AS NEEDED FOR WHEEZING OR SHORTNESS OF BREATH, Disp: 18 g, Rfl: 12 .  benztropine (COGENTIN) 0.5 MG tablet, Take 5 mg by mouth daily., Disp: , Rfl:  .  cyclobenzaprine (FLEXERIL) 10 MG tablet, Take 10 mg by mouth 3 (three) times daily as needed., Disp: , Rfl: 1 .  doxepin (SINEQUAN) 10 MG capsule, Take 20 mg by mouth at bedtime., Disp: , Rfl: 2 .  EPINEPHrine (EPI-PEN) 0.3 mg/0.3 mL DEVI, Inject 0.3 mg into the muscle once. As needed for severe allergic reaction, Disp: , Rfl:  .  famotidine (PEPCID) 20 MG tablet, Take 1 tablet (20 mg total) by mouth 2 (two) times daily. For stomach acid., Disp: 60 tablet, Rfl: 0 .  FLUoxetine (PROZAC) 20 MG capsule, Take 20 mg by mouth daily., Disp: , Rfl:  .  Fluticasone-Salmeterol (ADVAIR DISKUS) 250-50 MCG/DOSE AEPB, Inhale 1 puf, then rinse mouth twice daily maintenance, Disp: 60 each, Rfl: 12 .  hydrOXYzine (ATARAX/VISTARIL) 25 MG tablet, Take 25 mg by mouth 3 (three) times daily., Disp: , Rfl: 2 .  ipratropium-albuterol (DUONEB)  0.5-2.5 (3) MG/3ML SOLN, 1 neb every 6 hours if needed, Disp: 75 mL, Rfl: 12 .  lamoTRIgine (LAMICTAL) 25 MG tablet, Take 25 mg by mouth at bedtime. , Disp: , Rfl:  .  metoprolol succinate (TOPROL-XL) 50 MG 24 hr tablet, TAKE 1 TABLET(50MG  TOTAL) BY MOUTH EVERY DAY WITH OR IMMEDIATELY FOLLOWING A MEAL, Disp: 90 tablet, Rfl: 0 .  Multiple Vitamin (MULTIVITAMIN WITH MINERALS) TABS, Take 1 tablet by mouth daily. For nutritional supplementation., Disp: 30 tablet, Rfl: 0 .  polyethylene glycol (MIRALAX / GLYCOLAX) packet, Take 17 g by mouth daily as needed for mild constipation., Disp: 14 each, Rfl: 0 .  traZODone (DESYREL) 150 MG tablet, Take 150 mg by mouth at bedtime., Disp: , Rfl:  .  trifluoperazine (STELAZINE) 2 MG tablet, Take 2 mg by mouth at bedtime., Disp: , Rfl:   Clinical Interview:   Cognitive Symptoms: Decreased short-term memory: Endorsed. She reported trouble remembering the details of previous conversations, asking repetitive questions, trouble remembering the names of familiar individuals, and misplacing things around the home. Difficulties were said to be present for the past 3 years but seemed to have worsened over the past year.  Decreased long-term memory: Denied. Decreased attention/concentration: Endorsed. She noted frequently losing her train of thought mid-sentence, as well as trouble staying focused and being more easily distracted. These abilities were said to be novel in nature and appear to have worsened over the past year.  Reduced processing speed: Endorsed. She described her processing speed as seeming "slowed down." Difficulties with executive functions: Endorsed. She reported primary difficulties surrounding organization, complex  planning, and multi-tasking. She denied difficulties with impulsivity or using good judgment. Overt personality changes (outside of some mildly increased agitation or frustration) were denied.  Difficulties with emotion regulation:  Denied. Difficulties with receptive language: Endorsed. Difficulties were said to be particularly present when the individual speaking to her speaks too quickly for her to process.  Difficulties with word finding: Endorsed. Decreased visuoperceptual ability: Denied.  Difficulties completing ADLs: Somewhat. She denied difficulties with medication management. She reported that her sister has taken over bill paying and financial management, with [name]. [place] endorsing her belief that she would have difficulty performing these tasks independently. Her sister also reportedly took away [name]. Zuidema's keys after an instance where she got turned around on a familiar route. Other driving related concerns (e.g., recent history of accidents) were denied.   Additional Medical History: History of traumatic brain injury/concussion: Denied. History of stroke: Denied. History of seizure activity: Endorsed (see above). [name] reported that her most recent seizure occurred approximately 4-5 years ago. History of known exposure to toxins: Denied. Symptoms of chronic pain: Endorsed. Medical records suggest a history of fibromyalgia. She described prominent back pain. She reported using OTC medications for pain relief. These were said to "tone it down" but never eliminate pain symptoms.  Experience of frequent headaches/migraines: Endorsed. She reported headache symptoms occurring approximately once per week. Medical records suggest a remote history of migraine headaches. Frequent instances of dizziness/vertigo: Endorsed. Symptoms were largely said to be present upon first awakening and when standing too quickly from a previously seated or prone position. They have contributed to several falls in the past, including a somewhat recent fall where she hit her face, resulting in a black eye and bloody nose. No loss of consciousness was reported.   Sensory changes: Denied. Balance/coordination difficulties: Endorsed. She  reported ongoing balance instability. Symptoms were said to be a combination of feeling dizzy/lightheaded, experiencing shortness of breath, chronic back pain, and feeling weakness in her lower extremities (right worse than left). She estimated an average of two falls per month.  Other motor difficulties: Endorsed. She reported bilateral tremulous behaviors in her hands, but only during periods of heightened stress or anxiety.   Sleep History: Estimated hours obtained each night: 7 hours.  Difficulties falling asleep: Endorsed. She acknowledged it occasionally taking her a long amount of time to initially fall asleep. She largely attributed this to her previously working night shifts and never fully regaining a normal sleep-wake cycle.  Difficulties staying asleep: Endorsed. She described her sleep as very "broken," stating that she often wakes throughout the night due to pain symptoms, as well as times where the reason for her awakening is unclear.  Feels rested and refreshed upon awakening: Endorsed.  History of snoring: Endorsed. History of waking up gasping for air: Endorsed. Witnessed breath cessation while asleep: Endorsed. She reported previously diagnosed with mild obstructive sleep apnea. She reported that she was not prescribed a CPAP machine, but uses an oxygen machine nightly.   History of vivid dreaming: Endorsed. She reported a recent recurring dream in which she finds a baby in her bed. She described the baby as "pretty" at first, but upon further inspection, reports it having a disfigured face. She is unclear regarding potential reasons for this recurring dream, but noted that she only sees this child while asleep.  Excessive movement while asleep: Endorsed. Instances of acting out her dreams: Endorsed. She reported a history of talking in her sleep (i.e., asking and answering her own questions),  as well as instances where she moves her hands around. She further reported sometimes  "tear[ing] the bed up" and waking up with her "sheets everywhere." This was said to be ongoing for the past 6 months.    Psychiatric/Behavioral Health History: Depression: Endorsed. [name] acknowledged a history of bipolar I disorder, with her most recent depressive episode occurring within the past year. She also endorsed prior suicidal ideation with intent, and attempted suicide via prescription medication overdose 2-3 years prior. She reported calling for help after she ingested this mediation. Currently, she described her mood as "good" and reported her belief that her bipolar symptoms are well managed. She reported working with a therapist/counselor for the past year with positive effect.  Anxiety: Endorsed. She reported longstanding symptoms of generalized anxiety, as well as more recent symptoms surrounding her subjective cognitive decline.  Mania: Endorsed. She reported that her most recent manic episode occurred approximately 6 months prior.  Trauma History: Endorsed. She noted that she has been dealing with the abrupt and unexpected passing of several young family members over the past two months.  Visual/auditory hallucinations: Denied. Delusional thoughts: Denied.  Tobacco: Denied. Alcohol: She denied current alcohol consumption, as well as a history of prior alcohol abuse or dependence.  Recreational drugs: Denied. Caffeine: An occasional cup of coffee.   Family History: Problem Relation Age of Onset  . Heart disease Father   . Ovarian cancer Mother   . Breast cancer Mother   . Alzheimer's disease Sister 45  . Colon cancer Neg Hx   . Esophageal cancer Neg Hx   . Stomach cancer Neg Hx   . Rectal cancer Neg Hx    This information was confirmed by [name].  Academic/Vocational History: Highest level of educational attainment: 14 years. She graduated from high school and completed an additional two years of college. She described herself as a good (A/B) student in  academic settings. Math was noted as a relative weakness.  History of developmental delay: Denied. History of grade repetition: Denied. Enrollment in special education courses: Denied. History of LD/ADHD: Denied.  Employment: [name]. [place] currently receives disability benefits due to her various medical and psychiatric ailments. 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   Evaluation Results:   Behavioral Observations: [name]. [place] was unaccompanied, arrived to her appointment on time, and was appropriately dressed and groomed. She ambulated with the assistance of a rolling walker, but moved at an appropriate speed and did not exhibit frank balance instability. Gross motor functioning appeared intact upon informal observation and no abnormal movements (e.g., tremors) were noted. Her affect was generally relaxed and positive, but did range appropriately given the subject being discussed during the clinical interview or the task at hand during testing procedures. Spontaneous speech was fluent. Some word finding was noted during the interview. This commonly led to her losing her train of thought and requiring the question to be asked again. Thought processes were coherent, organized, and normal in content. Insight into her cognitive difficulties appeared appropriate. During testing, sustained attention was appropriate. Task engagement was adequate and she persisted when challenged. Instructions had to be repeated/clarified several times throughout the evaluation, especially surrounding more complex tasks, and there was some concern surrounding receptive language impairment. Some of these tasks (TMT B) had to be discontinued due to poor performance and her forgetting task instructions mid-way through. Overall, [name] was cooperative with the clinical interview and subsequent testing procedures. Of note,  suspected visual impairment was quite apparent  throughout the evaluation. For example, during questionnaires, she skipped many items unintentionally and often circled responses encompassing the bottom half of one answer and the top half of another answer.   Adequacy of Effort: The validity of neuropsychological testing is limited by the extent to which the individual being tested may be assumed to have exerted adequate effort during testing. [name]. Snuffer expressed her intention to perform to the best of her abilities and exhibited adequate task engagement and persistence. Scores across stand-alone and embedded performance validity measures were variable. As such, the results of the current evaluation should be interpreted with a mild degree of caution and may underestimate true abilities.   Test Results: [name] was mildly disoriented at the time of the current evaluation. Points were lost for her stating the incorrect year ("2020"), date, and day of the week.   Intellectual abilities based upon educational and vocational attainment were estimated to be in the average range. Premorbid abilities were estimated to be within the well below average range based upon a single-word reading test.   Processing speed was exceptionally low to well below average. Basic attention was average. More complex attention (e.g., working memory) was exceptionally low to below average. Executive functioning was exceptionally low to well below average.  Assessed receptive language abilities were exceptionally low. Assessed expressive language (e.g., verbal fluency and confrontation naming) was exceptionally low.     Assessed visuospatial/visuoconstructional abilities were exceptionally low outside of her clock drawing which was within normal limits.    Learning (i.e., encoding) of novel verbal and visual information was exceptionally low to well below average. Spontaneous delayed recall (i.e., retrieval) of previously learned information was exceptionally low.  Retention rates were 25% across a story learning task, 29% across a list learning task, and 0% across a shape learning task. Performance across recognition tasks was generally poor, suggesting limited evidence for information consolidation.  Fine motor coordination and speed was impaired bilaterally.   Results of emotional screening instruments suggested that recent symptoms of generalized anxiety were in the severe range, while symptoms of depression were within the moderate range. A screening instrument assessing recent sleep quality suggested the presence of mild sleep dysfunction.  Tables of Scores:   Note: This summary of test scores accompanies the interpretive report and should not be considered in isolation without reference to the appropriate sections in the text. Descriptors are based on appropriate normative data and may be adjusted based on clinical judgment. The terms "impaired" and "within normal limits (WNL)" are used when a more specific level of functioning cannot be determined.       Effort Testing:   DESCRIPTOR       Test of Memory Malingering (TOMM): --- --- Within Expectation  ACS Word Choice: --- --- Below Expectation  WAIS-IV Reliable Digit Span: --- --- Within Expectation  HVLT-R Recognition Discrimination Index: --- --- Below Expectation  BVMT-R Retention Percentage: --- --- Below Expectation       Orientation:      Raw Score Percentile   NAB Orientation, Form 1 26/29 --- ---       Intellectual Functioning:           Standard Score Percentile   Test of Premorbid Functioning: 52 7 Well Below Average       Memory:          Wechsler Memory Scale (WMS-IV):  Raw Score (Scaled Score) Percentile     Logical Memory I 12/50 (4) 2 Well Below Average    Logical Memory II 3/50 (2) <1 Exceptionally Low    Logical Memory Recognition 20/30 3-9 Well Below Average       Hopkins Verbal Learning Test (HVLT-R), Form 1: Raw Score (T Score) Percentile      Total Trials 1-3 16/36 (22) <1 Exceptionally Low    Delayed Recall 2/12 (6) <1 Exceptionally Low    Recognition Discrimination Index 4 (2) <1 Exceptionally Low      True Positives 6 --- ---      False Positives 2 --- ---        Brief Visuospatial Memory Test (BVMT-R), Form 1: Raw Score (T Score) Percentile     Total Trials 1-3 1/36 (20) <1 Exceptionally Low    Delayed Recall 0/12 (20) <1 Exceptionally Low    Recognition Discrimination Index 3 3-5 Well Below Average      Recognition Hits 6/6 >16 Within Normal Limits      False Positive Errors 3 <1 Exceptionally Low        Attention/Executive Function:          Trail Making Test (TMT): Raw Score (T Score) Percentile     Part A 81 secs.,  0 errors (23) <1 Exceptionally Low    Part B Discontinued,  7 errors --- Impaired        Symbol Digit Modalities Test (SDMT): Raw Score (Z-Score) Percentile     Written 18 (-3.56) <1 Exceptionally Low    Oral 20 (-3.86) <1 Exceptionally Low        Scaled Score Percentile   WAIS-IV Digit Span: 5 5 Well Below Average    Forward 10 50 Average    Backward 6 9 Below Average    Sequencing 2 <1 Exceptionally Low        Scaled Score Percentile   WAIS-IV Similarities: 8 25 Average       D-KEFS Color-Word Interference Test: Raw Score (Scaled Score) Percentile     Color Naming 45 secs. (4) 2 Well Below Average    Word Reading 35 secs. (3) 1 Exceptionally Low    Inhibition 174 secs. (1) <1 Exceptionally Low      Total Errors 21 errors (1) <1 Exceptionally Low    Inhibition/Switching 112 secs. (4) 2 Well Below Average      Total Errors 8 errors (4) 2 Well Below Average       D-KEFS Verbal Fluency Test: Raw Score (Scaled Score) Percentile     Letter Total Correct 11 (2) <1 Exceptionally Low    Category Total Correct 17 (2) <1 Exceptionally Low    Category Switching Total Correct 8 (4) 2 Well Below Average    Category Switching Accuracy 7 (5) 5 Well Below Average      Total Set Loss Errors 3 (9) 37  Average      Total Repetition Errors 1 (12) 75 Above Average       D-KEFS Design Fluency Test: Raw Score (Scaled Score) Percentile     Condition 1 - Filled Dots 3 (5) 5 Well Below Average    Condition 2 - Empty Dots 6 (7) 16 Below Average    Condition 3 - Switching 0 (3) 1 Exceptionally Low      Total Set Loss Errors 4 (9) 37 Average      Total Repetition Errors 1 (13) 84 Above Average  [place] [employer] [facility]): Raw Score Percentile     Categories (trials) 0 (64) 2-5 Well Below Average    Total Errors 44 1 Exceptionally Low    Perseverative Errors 22 3 Well Below Average    Non-Perseverative Errors 22 1 Exceptionally Low    Failure to Maintain Set 0 --- ---       Language:          Verbal Fluency Test: Raw Score  (T Score) Percentile     Phonemic Fluency (FAS) 11 (23) <1 Exceptionally Low    Animal Fluency 7 (24) <1 Exceptionally Low        NAB Language Module, Form 1: T Score Percentile     Auditory Comprehension 19 <1 Exceptionally Low    Naming 26/31 (27) 1 Exceptionally Low       Visuospatial/Visuoconstruction:      Raw Score Percentile   Clock Drawing: 9/10 --- Within Normal Limits       NAB Spatial Module, Form 1: T Score Percentile     Figure Drawing Copy 21 <1 Exceptionally Low        Scaled Score Percentile   WAIS-IV Matrix Reasoning: 4 2 Well Below Average       Sensory-Motor:          [place] Grooved Pegboard Test: Raw Score Percentile     Dominant Hand 20 pegs @ 150 secs., 1 drop --- Impaired    Non-Dominant Hand 12 pegs @ 150 secs., 1 drop --- Impaired       Mood and Personality:      Raw Score Percentile   Beck Depression Inventory - II: 20 --- Moderate  Beck Anxiety Inventory: 38 --- Severe       Additional Questionnaires:      Raw Score Percentile   PROMIS Sleep Disturbance Questionnaire: 28 --- Mild   Informed Consent and Coding/Compliance:   [name]. Scheck was provided with a verbal description of the nature and purpose of the  present neuropsychological evaluation. Also reviewed were the foreseeable risks and/or discomforts and benefits of the procedure, limits of confidentiality, and mandatory reporting requirements of this provider. The patient was given the opportunity to ask questions and receive answers about the evaluation. Oral consent to participate was provided by the patient.   This evaluation was conducted by Christia Reading, Ph.D., licensed clinical neuropsychologist. [name]. Gemmel completed a comprehensive clinical interview with Dr. Melvyn Novas, billed as one unit [phone], and 190 minutes of cognitive testing and scoring, billed as one unit [phone] and five additional units [id number]. Psychometrist Cruzita Lederer, B.S., assisted Dr. Melvyn Novas with test administration and scoring procedures. As a separate and discrete service, Dr. Melvyn Novas spent a total of 180 minutes in interpretation and report writing billed as one unit [phone] and two units [id number].

## 2019-09-07 NOTE — Progress Notes (Signed)
   Psychometrician Note   Cognitive testing was administered to [name] by Cruzita Lederer, B.S. (psychometrist) under the supervision of Dr. Christia Reading, Ph.D., licensed psychologist. [name] did not appear overtly distressed by the testing session per behavioral observation or responses across self-report questionnaires. Dr. Christia Reading, Ph.D. checked in with [name] as needed to manage any distress related to testing procedures (if applicable). Rest breaks were offered.    The battery of tests administered was selected by Dr. Christia Reading, Ph.D. with consideration to [name]'s current level of functioning, the nature of her symptoms, emotional and behavioral responses during interview, level of literacy, observed level of motivation/effort, and the nature of the referral question. This battery was communicated to the psychometrist. Communication between Dr. Christia Reading, Ph.D. and the psychometrist was ongoing throughout the evaluation and Dr. Christia Reading, Ph.D. was immediately accessible at all times. Dr. Christia Reading, Ph.D. provided supervision to the psychometrist on the date of this service to the extent necessary to assure the quality of all services provided.    [name] will return within approximately 1-2 weeks for an interactive feedback session with Dr. Melvyn Novas at which time her test performances, clinical impressions, and treatment recommendations will be reviewed in detail. [name] understands she can contact our office should she require our assistance before this time.  A total of 190 minutes of billable time were spent face-to-face with [name] by the psychometrist. This includes both test administration and scoring time. Billing for these services is reflected in the clinical report generated by Dr. Christia Reading, Ph.D..  This note reflects time spent with the psychometrician and does not include test scores or any clinical  interpretations made by Dr. Melvyn Novas. The full report will follow in a separate note.

## 2019-09-08 ENCOUNTER — Encounter: Payer: Self-pay | Admitting: Psychology

## 2019-09-08 DIAGNOSIS — G3184 Mild cognitive impairment, so stated: Secondary | ICD-10-CM

## 2019-09-08 DIAGNOSIS — F329 Major depressive disorder, single episode, unspecified: Secondary | ICD-10-CM

## 2019-09-08 HISTORY — DX: Major depressive disorder, single episode, unspecified: F32.9

## 2019-09-08 HISTORY — DX: Mild cognitive impairment of uncertain or unknown etiology: G31.84

## 2019-09-14 ENCOUNTER — Other Ambulatory Visit: Payer: Self-pay

## 2019-09-14 ENCOUNTER — Encounter: Payer: Self-pay | Admitting: Psychology

## 2019-09-14 ENCOUNTER — Ambulatory Visit (INDEPENDENT_AMBULATORY_CARE_PROVIDER_SITE_OTHER): Payer: Self-pay | Admitting: Psychology

## 2019-09-14 DIAGNOSIS — F319 Bipolar disorder, unspecified: Secondary | ICD-10-CM

## 2019-09-14 DIAGNOSIS — F411 Generalized anxiety disorder: Secondary | ICD-10-CM

## 2019-09-14 DIAGNOSIS — F331 Major depressive disorder, recurrent, moderate: Secondary | ICD-10-CM

## 2019-09-14 DIAGNOSIS — G3184 Mild cognitive impairment, so stated: Secondary | ICD-10-CM

## 2019-09-14 NOTE — Progress Notes (Signed)
   Neuropsychology Feedback Session [name]. [facility]  Reason for Referral:   Chelsea Kullberg Wrightis a 59 y.o. right-handed African-American female referred by Metta Clines, D.O.,to characterize hercurrent cognitive functioning and assist with diagnostic clarity and treatment planning in the context of subjective cognitive decline, multiple sclerosis, seizure history, and several medical and psychiatric comorbidities.  Feedback:   [name] completed a comprehensive neuropsychological evaluation on 09/07/2019. Please refer to that encounter for the full report and recommendations. Briefly, scores across stand-alone and embedded performance validity measures were variable, with several scores scoring below expectation. As such, the results of the current evaluation should be interpreted with a mild degree of caution and may underestimate true abilities. If taken at face value, [name] pattern of performance is suggestive of diffuse cognitive impairment, often in the well below average and exceptionally low normative ranges. Deficits in receptive language (e.g., comprehending what is being stated to you) and processing speed appeared most salient and it is commonplace for deficits in these areas to create and/or worsen deficits in other cognitive domains. Responses across mood-related questionnaires suggested severe anxiety and moderate depression over the past 1-2 weeks, as well as mild sleep dysfunction. Overall, given report of some difficulties performing activities of daily living (ADLs) independently, 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 She could certainly be trending towards a major neurocognitive disorder; however, this is difficult to determine given validity concerns surrounding test scores.   [name] was unaccompanied during the current telephone call. Content of  the current session focused on the results of her neuropsychological evaluation. [name] was given the opportunity to ask questions and her questions were answered. She was encouraged to reach out should additional questions arise.     19 minutes were spent conducting the current feedback session with [name], billed as one unit [phone].

## 2019-09-19 ENCOUNTER — Other Ambulatory Visit: Payer: Self-pay | Admitting: Family Medicine

## 2019-10-03 ENCOUNTER — Other Ambulatory Visit: Payer: Self-pay

## 2019-10-04 ENCOUNTER — Ambulatory Visit (INDEPENDENT_AMBULATORY_CARE_PROVIDER_SITE_OTHER): Payer: Self-pay | Admitting: Family Medicine

## 2019-10-04 ENCOUNTER — Encounter: Payer: Self-pay | Admitting: Family Medicine

## 2019-10-04 VITALS — BP 144/88 | HR 84 | Temp 96.0°F | Ht 67.5 in | Wt 267.6 lb

## 2019-10-04 DIAGNOSIS — I10 Essential (primary) hypertension: Secondary | ICD-10-CM

## 2019-10-04 DIAGNOSIS — F250 Schizoaffective disorder, bipolar type: Secondary | ICD-10-CM

## 2019-10-04 DIAGNOSIS — M5416 Radiculopathy, lumbar region: Secondary | ICD-10-CM

## 2019-10-04 DIAGNOSIS — Z0001 Encounter for general adult medical examination with abnormal findings: Secondary | ICD-10-CM

## 2019-10-04 DIAGNOSIS — E782 Mixed hyperlipidemia: Secondary | ICD-10-CM

## 2019-10-04 DIAGNOSIS — E559 Vitamin D deficiency, unspecified: Secondary | ICD-10-CM

## 2019-10-04 DIAGNOSIS — R531 Weakness: Secondary | ICD-10-CM

## 2019-10-04 DIAGNOSIS — Z0000 Encounter for general adult medical examination without abnormal findings: Secondary | ICD-10-CM

## 2019-10-04 HISTORY — DX: Mixed hyperlipidemia: E78.2

## 2019-10-04 HISTORY — DX: Vitamin D deficiency, unspecified: E55.9

## 2019-10-04 HISTORY — DX: Schizoaffective disorder, bipolar type: F25.0

## 2019-10-04 LAB — BASIC METABOLIC PANEL
BUN: 12 mg/dL (ref 6–23)
CO2: 27 mEq/L (ref 19–32)
Calcium: 9.1 mg/dL (ref 8.4–10.5)
Chloride: 101 mEq/L (ref 96–112)
Creatinine, Ser: 0.93 mg/dL (ref 0.40–1.20)
GFR: 75.03 mL/min (ref >60.00)
Glucose, Bld: 93 mg/dL (ref 70–99)
Potassium: 3.8 mEq/L (ref 3.5–5.1)
Sodium: 137 mEq/L (ref 135–145)

## 2019-10-04 LAB — LIPID PANEL
Cholesterol: 230 mg/dL — ABNORMAL HIGH (ref 0–200)
HDL: 62.8 mg/dL (ref >39.00)
LDL Cholesterol: 152 mg/dL — ABNORMAL HIGH (ref 0–99)
NonHDL: 166.95
Total CHOL/HDL Ratio: 4
Triglycerides: 73 mg/dL (ref 0.0–149.0)
VLDL: 14.6 mg/dL (ref 0.0–40.0)

## 2019-10-04 LAB — CBC
HCT: 40.4 % (ref 36.0–46.0)
Hemoglobin: 13.1 g/dL (ref 12.0–15.0)
MCHC: 32.5 g/dL (ref 30.0–36.0)
MCV: 80.2 fl (ref 78.0–100.0)
Platelets: 314 10*3/uL (ref 150.0–400.0)
RBC: 5.04 Mil/uL (ref 3.87–5.11)
RDW: 14 % (ref 11.5–15.5)
WBC: 5.4 10*3/uL (ref 4.0–10.5)

## 2019-10-04 LAB — HEMOGLOBIN A1C: Hgb A1c MFr Bld: 6.1 % (ref 4.6–6.5)

## 2019-10-04 LAB — FOLATE: Folate: 8.9 ng/mL (ref >5.9)

## 2019-10-04 LAB — VITAMIN B12: Vitamin B-12: 634 pg/mL (ref 211–911)

## 2019-10-04 LAB — VITAMIN D 25 HYDROXY (VIT D DEFICIENCY, FRACTURES): VITD: 8.96 ng/mL — ABNORMAL LOW (ref 30.00–100.00)

## 2019-10-04 MED ORDER — METOPROLOL SUCCINATE ER 50 MG PO TB24: 50.0000 mg | ORAL_TABLET | Freq: Every day | ORAL | 3 refills | Status: DC

## 2019-10-04 MED ORDER — VITAMIN D (ERGOCALCIFEROL) 1.25 MG (50000 UNIT) PO CAPS: 50000.0000 [IU] | ORAL_CAPSULE | ORAL | 0 refills | Status: DC

## 2019-10-04 NOTE — Patient Instructions (Signed)
Preventive Care 40-58 Years Old, Female Preventive care refers to visits with your health care provider and lifestyle choices that can promote health and wellness. This includes:  A yearly physical exam. This may also be called an annual well check.  Regular dental visits and eye exams.  Immunizations.  Screening for certain conditions.  Healthy lifestyle choices, such as eating a healthy diet, getting regular exercise, not using drugs or products that contain nicotine and tobacco, and limiting alcohol use. What can I expect for my preventive care visit? Physical exam Your health care provider will check your:  Height and weight. This may be used to calculate body mass index (BMI), which tells if you are at a healthy weight.  Heart rate and blood pressure.  Skin for abnormal spots. Counseling Your health care provider may ask you questions about your:  Alcohol, tobacco, and drug use.  Emotional well-being.  Home and relationship well-being.  Sexual activity.  Eating habits.  Work and work environment.  Method of birth control.  Menstrual cycle.  Pregnancy history. What immunizations do I need?  Influenza (flu) vaccine  This is recommended every year. Tetanus, diphtheria, and pertussis (Tdap) vaccine  You may need a Td booster every 10 years. Varicella (chickenpox) vaccine  You may need this if you have not been vaccinated. Zoster (shingles) vaccine  You may need this after age 60. Measles, mumps, and rubella (MMR) vaccine  You may need at least one dose of MMR if you were born in 1957 or later. You may also need a second dose. Pneumococcal conjugate (PCV13) vaccine  You may need this if you have certain conditions and were not previously vaccinated. Pneumococcal polysaccharide (PPSV23) vaccine  You may need one or two doses if you smoke cigarettes or if you have certain conditions. Meningococcal conjugate (MenACWY) vaccine  You may need this if you  have certain conditions. Hepatitis A vaccine  You may need this if you have certain conditions or if you travel or work in places where you may be exposed to hepatitis A. Hepatitis B vaccine  You may need this if you have certain conditions or if you travel or work in places where you may be exposed to hepatitis B. Haemophilus influenzae type b (Hib) vaccine  You may need this if you have certain conditions. Human papillomavirus (HPV) vaccine  If recommended by your health care provider, you may need three doses over 6 months. You may receive vaccines as individual doses or as more than one vaccine together in one shot (combination vaccines). Talk with your health care provider about the risks and benefits of combination vaccines. What tests do I need? Blood tests  Lipid and cholesterol levels. These may be checked every 5 years, or more frequently if you are over 50 years old.  Hepatitis C test.  Hepatitis B test. Screening  Lung cancer screening. You may have this screening every year starting at age 55 if you have a 30-pack-year history of smoking and currently smoke or have quit within the past 15 years.  Colorectal cancer screening. All adults should have this screening starting at age 50 and continuing until age 75. Your health care provider may recommend screening at age 45 if you are at increased risk. You will have tests every 1-10 years, depending on your results and the type of screening test.  Diabetes screening. This is done by checking your blood sugar (glucose) after you have not eaten for a while (fasting). You may have this   done every 1-3 years.  Mammogram. This may be done every 1-2 years. Talk with your health care provider about when you should start having regular mammograms. This may depend on whether you have a family history of breast cancer.  BRCA-related cancer screening. This may be done if you have a family history of breast, ovarian, tubal, or peritoneal  cancers.  Pelvic exam and Pap test. This may be done every 3 years starting at age 74. Starting at age 51, this may be done every 5 years if you have a Pap test in combination with an HPV test. Other tests  Sexually transmitted disease (STD) testing.  Bone density scan. This is done to screen for osteoporosis. You may have this scan if you are at high risk for osteoporosis. Follow these instructions at home: Eating and drinking  Eat a diet that includes fresh fruits and vegetables, whole grains, lean protein, and low-fat dairy.  Take vitamin and mineral supplements as recommended by your health care provider.  Do not drink alcohol if: ? Your health care provider tells you not to drink. ? You are pregnant, may be pregnant, or are planning to become pregnant.  If you drink alcohol: ? Limit how much you have to 0-1 drink a day. ? Be aware of how much alcohol is in your drink. In the [place] one drink equals one 12 oz bottle of beer (355 mL), one 5 oz glass of wine (148 mL), or one 1 oz glass of hard liquor (44 mL). Lifestyle  Take daily care of your teeth and gums.  Stay active. Exercise for at least 30 minutes on 5 or more days each week.  Do not use any products that contain nicotine or tobacco, such as cigarettes, e-cigarettes, and chewing tobacco. If you need help quitting, ask your health care provider.  If you are sexually active, practice safe sex. Use a condom or other form of birth control (contraception) in order to prevent pregnancy and STIs (sexually transmitted infections).  If told by your health care provider, take low-dose aspirin daily starting at age 48. What's next?  Visit your health care provider once a year for a well check visit.  Ask your health care provider how often you should have your eyes and teeth checked.  Stay up to date on all vaccines. This information is not intended to replace advice given to you by your health care provider. Make sure you  discuss any questions you have with your health care provider. Document Revised: 03/03/2018 Document Reviewed: 03/03/2018 Elsevier Patient Education  [place] Your Hypertension Hypertension is commonly called high blood pressure. This is when the force of your blood pressing against the walls of your arteries is too strong. Arteries are blood vessels that carry blood from your heart throughout your body. Hypertension forces the heart to work harder to pump blood, and may cause the arteries to become narrow or stiff. Having untreated or uncontrolled hypertension can cause heart attack, stroke, kidney disease, and other problems. What are blood pressure readings? A blood pressure reading consists of a higher number over a lower number. Ideally, your blood pressure should be below 120/80. The first ("top") number is called the systolic pressure. It is a measure of the pressure in your arteries as your heart beats. The second ("bottom") number is called the diastolic pressure. It is a measure of the pressure in your arteries as the heart relaxes. What does my blood pressure reading mean? Blood pressure  is classified into four stages. Based on your blood pressure reading, your health care provider may use the following stages to determine what type of treatment you need, if any. Systolic pressure and diastolic pressure are measured in a unit called mm Hg. Normal  Systolic pressure: below 412.  Diastolic pressure: below 80. Elevated  Systolic pressure: 878-676.  Diastolic pressure: below 80. Hypertension stage 1  Systolic pressure: 720-947.  Diastolic pressure: 09-62. Hypertension stage 2  Systolic pressure: 836 or above.  Diastolic pressure: 90 or above. What health risks are associated with hypertension? Managing your hypertension is an important responsibility. Uncontrolled hypertension can lead to:  A heart attack.  A stroke.  A weakened blood vessel  (aneurysm).  Heart failure.  Kidney damage.  Eye damage.  Metabolic syndrome.  Memory and concentration problems. What changes can I make to manage my hypertension? Hypertension can be managed by making lifestyle changes and possibly by taking medicines. Your health care provider will help you make a plan to bring your blood pressure within a normal range. Eating and drinking   Eat a diet that is high in fiber and potassium, and low in salt (sodium), added sugar, and fat. An example eating plan is called the DASH (Dietary Approaches to Stop Hypertension) diet. To eat this way: ? Eat plenty of fresh fruits and vegetables. Try to fill half of your plate at each meal with fruits and vegetables. ? Eat whole grains, such as whole wheat pasta, brown rice, or whole grain bread. Fill about one quarter of your plate with whole grains. ? Eat low-fat diary products. ? Avoid fatty cuts of meat, processed or cured meats, and poultry with skin. Fill about one quarter of your plate with lean proteins such as fish, chicken without skin, beans, eggs, and tofu. ? Avoid premade and processed foods. These tend to be higher in sodium, added sugar, and fat.  Reduce your daily sodium intake. Most people with hypertension should eat less than 1,500 mg of sodium a day.  Limit alcohol intake to no more than 1 drink a day for nonpregnant women and 2 drinks a day for men. One drink equals 12 oz of beer, 5 oz of wine, or 1 oz of hard liquor. Lifestyle  Work with your health care provider to maintain a healthy body weight, or to lose weight. Ask what an ideal weight is for you.  Get at least 30 minutes of exercise that causes your heart to beat faster (aerobic exercise) most days of the week. Activities may include walking, swimming, or biking.  Include exercise to strengthen your muscles (resistance exercise), such as weight lifting, as part of your weekly exercise routine. Try to do these types of exercises for  30 minutes at least 3 days a week.  Do not use any products that contain nicotine or tobacco, such as cigarettes and e-cigarettes. If you need help quitting, ask your health care provider.  Control any long-term (chronic) conditions you have, such as high cholesterol or diabetes. Monitoring  Monitor your blood pressure at home as told by your health care provider. Your personal target blood pressure may vary depending on your medical conditions, your age, and other factors.  Have your blood pressure checked regularly, as often as told by your health care provider. Working with your health care provider  Review all the medicines you take with your health care provider because there may be side effects or interactions.  Talk with your health care provider about your  diet, exercise habits, and other lifestyle factors that may be contributing to hypertension.  Visit your health care provider regularly. Your health care provider can help you create and adjust your plan for managing hypertension. Will I need medicine to control my blood pressure? Your health care provider may prescribe medicine if lifestyle changes are not enough to get your blood pressure under control, and if:  Your systolic blood pressure is 130 or higher.  Your diastolic blood pressure is 80 or higher. Take medicines only as told by your health care provider. Follow the directions carefully. Blood pressure medicines must be taken as prescribed. The medicine does not work as well when you skip doses. Skipping doses also puts you at risk for problems. Contact a health care provider if:  You think you are having a reaction to medicines you have taken.  You have repeated (recurrent) headaches.  You feel dizzy.  You have swelling in your ankles.  You have trouble with your vision. Get help right away if:  You develop a severe headache or confusion.  You have unusual weakness or numbness, or you feel faint.  You have  severe pain in your chest or abdomen.  You vomit repeatedly.  You have trouble breathing. Summary  Hypertension is when the force of blood pumping through your arteries is too strong. If this condition is not controlled, it may put you at risk for serious complications.  Your personal target blood pressure may vary depending on your medical conditions, your age, and other factors. For most people, a normal blood pressure is less than 120/80.  Hypertension is managed by lifestyle changes, medicines, or both. Lifestyle changes include weight loss, eating a healthy, low-sodium diet, exercising more, and limiting alcohol. This information is not intended to replace advice given to you by your health care provider. Make sure you discuss any questions you have with your health care provider. Document Revised: 10/14/2018 Document Reviewed: 05/20/2016 Elsevier Patient Education  [place].  Fatigue If you have fatigue, you feel tired all the time and have a lack of energy or a lack of motivation. Fatigue may make it difficult to start or complete tasks because of exhaustion. In general, occasional or mild fatigue is often a normal response to activity or life. However, long-lasting (chronic) or extreme fatigue may be a symptom of a medical condition. Follow these instructions at home: General instructions  Watch your fatigue for any changes.  Go to bed and get up at the same time every day.  Avoid fatigue by pacing yourself during the day and getting enough sleep at night.  Maintain a healthy weight. Medicines  Take over-the-counter and prescription medicines only as told by your health care provider.  Take a multivitamin, if told by your health care provider.  Do not use herbal or dietary supplements unless they are approved by your health care provider. Activity   Exercise regularly, as told by your health care provider.  Use or practice techniques to help you relax, such  as yoga, tai chi, meditation, or massage therapy. Eating and drinking   Avoid heavy meals in the evening.  Eat a well-balanced diet, which includes lean proteins, whole grains, plenty of fruits and vegetables, and low-fat dairy products.  Avoid consuming too much caffeine.  Avoid the use of alcohol.  Drink enough fluid to keep your urine pale yellow. Lifestyle  Change situations that cause you stress. Try to keep your work and personal schedule in balance.  Do not  use any products that contain nicotine or tobacco, such as cigarettes and e-cigarettes. If you need help quitting, ask your health care provider.  Do not use drugs. Contact a health care provider if:  Your fatigue does not get better.  You have a fever.  You suddenly lose or gain weight.  You have headaches.  You have trouble falling asleep or sleeping through the night.  You feel angry, guilty, anxious, or sad.  You are unable to have a bowel movement (constipation).  Your skin is dry.  You have swelling in your legs or another part of your body. Get help right away if:  You feel confused.  Your vision is blurry.  You feel faint or you pass out.  You have a severe headache.  You have severe pain in your abdomen, your back, or the area between your waist and hips (pelvis).  You have chest pain, shortness of breath, or an irregular or fast heartbeat.  You are unable to urinate, or you urinate less than normal.  You have abnormal bleeding, such as bleeding from the rectum, vagina, nose, lungs, or nipples.  You vomit blood.  You have thoughts about hurting yourself or others. If you ever feel like you may hurt yourself or others, or have thoughts about taking your own life, get help right away. You can go to your nearest emergency department or call:  Your local emergency services (911 in the [place]  A suicide crisis helpline, such as the [facility] at (670)221-5461.  This is open 24 hours a day. Summary  If you have fatigue, you feel tired all the time and have a lack of energy or a lack of motivation.  Fatigue may make it difficult to start or complete tasks because of exhaustion.  Long-lasting (chronic) or extreme fatigue may be a symptom of a medical condition.  Exercise regularly, as told by your health care provider.  Change situations that cause you stress. Try to keep your work and personal schedule in balance. This information is not intended to replace advice given to you by your health care provider. Make sure you discuss any questions you have with your health care provider. Document Revised: 01/11/2019 Document Reviewed: 03/17/2017 Elsevier Patient Education  2020 [employer].

## 2019-10-04 NOTE — Progress Notes (Signed)
Subjective:     [name] is a 59 y.o. female and is here for a comprehensive physical exam. The patient reports problems - back pain, weakness.  Pt seen by Dr. Beverely Pace for pain management/back injections.  Pt states she does not want to be on pain meds.  Notes increasing back pain with standing from herniated disc. Back pain better with bending forward or sitting.  Has episodes of pain that cause her to "shake", then fall.  Using a rollator to sit on to help avoid falling.  Fell in bathroom hitting the faucet in Feb.  Also had a fall in a store.  Notes an overall weak feeling and increased anxiety.  Pt does not feel current anxiety medication is helping.  Followed by [facility].  Pt requesting refill on Metoprolol.  Has not taken med this am.  Pt notes questionable h/o MS.  Dx'd out of states with MS, but advised she did not have it on recent scans.  Has not required treatment and has not had any flares.  Also seen neurology for memory concerns.  Pt states her testing has been negative.  S/p hysterectomy.  Colonoscopy done 06/12/2013.  Mammogram scheduled.  Social History   Socioeconomic History  . Marital status: Divorced    Spouse name: Not on file  . Number of children: 3  . Years of education: 57  . Highest education level: Some college, no degree  Occupational History  . Occupation: Disabled  Tobacco Use  . Smoking status: Former Smoker    Packs/day: 1.00    Years: 5.00    Pack years: 5.00    Quit date: 07/07/2007    Years since quitting: 12.2  . Smokeless tobacco: Never Used  . Tobacco comment: 06/18 pt states she was an occasional smoker  Substance and Sexual Activity  . Alcohol use: No  . Drug use: No  . Sexual activity: Not Currently    Birth control/protection: Surgical    Comment: X 1 year  Other Topics Concern  . Not on file  Social History Narrative   Single, 3 children   Right handed   Associates degree   occ 1 cup daily      09/01/2018: Lives with sister,  Rise Paganini, who helps manage medications, in 2 story house, and a pit bull   Has one daughter in [place], 2 sons in [place]; 7 grandchildren   Currently not exercising, but wants to return to water aerobics at [place] Strain:   . Difficulty of Paying Living Expenses:   Food Insecurity:   . Worried About Charity fundraiser in the Last Year:   . Arboriculturist in the Last Year:   Transportation Needs:   . Film/video editor (Medical):   Marland Kitchen Lack of Transportation (Non-Medical):   Physical Activity:   . Days of Exercise per Week:   . Minutes of Exercise per Session:   Stress:   . Feeling of Stress :   Social Connections:   . Frequency of Communication with Friends and Family:   . Frequency of Social Gatherings with Friends and Family:   . Attends Religious Services:   . Active Member of Clubs or Organizations:   . Attends Archivist Meetings:   Marland Kitchen Marital Status:   Intimate Partner Violence:   . Fear of Current or Ex-Partner:   . Emotionally Abused:   Marland Kitchen Physically Abused:   . Sexually Abused:  Health Maintenance  Topic Date Due  . TETANUS/TDAP  Never done  . MAMMOGRAM  09/08/2019  . INFLUENZA VACCINE  10/04/2019 (Originally 02/04/2019)  . COLONOSCOPY  06/13/2023  . Hepatitis C Screening  Completed  . HIV Screening  Completed  . PAP SMEAR-Modifier  Discontinued    The following portions of the patient's history were reviewed and updated as appropriate: allergies, current medications, past family history, past medical history, past social history, past surgical history and problem list.  Review of Systems Pertinent items noted in HPI and remainder of comprehensive ROS otherwise negative.   Objective:    BP (!) 144/88 (BP Location: Left Arm, Patient Position: Sitting, Cuff Size: Large)   Pulse 84   Temp (!) 96 F (35.6 C) (Temporal)   Ht 5' 7.5" (1.715 m)   Wt 267 lb 9.6 oz (121.4 kg)   SpO2 98%   BMI 41.29 kg/m   General appearance: alert, cooperative, fatigued and no distress Head: Normocephalic, without obvious abnormality, atraumatic Eyes: conjunctivae/corneas clear. PERRL, EOM's intact. Fundi benign. Ears: normal TM's and external ear canals both ears Nose: Nares normal. Septum midline. Mucosa normal. No drainage or sinus tenderness. Throat: lips, mucosa, and tongue normal; teeth and gums normal Neck: no adenopathy, no carotid bruit, no JVD, supple, symmetrical, trachea midline and thyroid not enlarged, symmetric, no tenderness/mass/nodules Back: TTP of lower cervical/upper thoracic spine and lumbar spine.  Lungs: clear to auscultation bilaterally Heart: regular rate and rhythm, S1, S2 normal, no murmur, click, rub or gallop Abdomen: soft, non-tender; bowel sounds normal; no masses,  no organomegaly Extremities: extremities normal, atraumatic, no cyanosis or edema Pulses: 2+ and symmetric Skin: Skin color, texture, turgor normal. No rashes or lesions Lymph nodes: Cervical, supraclavicular, and axillary nodes normal. Neurologic: Alert and oriented X 3, normal strength and tone. Normal symmetric reflexes. Normal coordination and gait Gait: slowed, using rollator to aid with ambulation   Palpation of spine caused faint tremor of b/l hands.  LE strength 4/5 b/l, L5 strength slightly weaker on R than L with dorsiflexion on great toe. Assessment:    Healthy female exam with chronic concerns.      Plan:     Anticipatory guidance given including wearing seatbelts, smoke detectors in the home, increasing physical activity, increasing p.o. intake of water and vegetables. -will obtain labs -Status post hysterectomy.  Continue follow-up with OB/GYN -mammogram scheduled  -colonoscopy done   Due 04/13/23 -given handout -next CPE in 1 yr See After Visit Summary for Counseling Recommendations    Weakness  -discussed possible causes vitamin def, deconditioning, worsening radiculopathy 2/2 herniated disc,  MS, depression -continue f/u with neurology and ortho - Plan: CBC (no diff), Basic metabolic panel, Hemoglobin A1c, Vitamin B12, Vitamin D, 25-hydroxy, Folate  Essential hypertension  -stable -Likely elevated 2/2 not taking medication the morning and pain. -Continue lifestyle modifications -Continue current medications metoprolol succinate 50 mg refilled - Plan: Basic metabolic panel, Toprol-XL  Mixed hyperlipidemia  - Plan: Lipid panel  Schizoaffective d/o, bipolar type -GAD-7 score 21 -PHQ-9 score 6 -Continue current medications. -Advised to schedule follow-up appointment with [facility] for anxiety medication adjustment -Given precautions -continue f/u with BH/counseling  Lumbar radiculopathy -History of bulging disks in degenerative disc disease per patient.  No recent imaging to confirm -Continue follow-up with Ortho for spinal injections. -Advised to ask about next steps for increasing pain as does not want to be on pain meds. -Continue water aerobics 4 times per week -Consider referral to Neurosurgery for second opinion.  Update:   Vit d Deficiency:  Vitamin D 8.96.  rx for Ergocalciferol 50,000 IU sent to pharmacy.  Total and LDL cholesterol elevated.  Will give pt option of lifestyle modifications x 3 months then medication if needed vs medication now.  Also consider starting ASA 81 mg daily, though with recent falls would be cautious.  F/u prn  Grier Mitts, MD

## 2019-10-09 NOTE — Progress Notes (Deleted)
NEUROLOGY FOLLOW UP OFFICE NOTE  [name] AB:[id number]  HISTORY OF PRESENT ILLNESS: [name] a 59 year oldright-handed African American woman with seizure disorder, questionable multiple sclerosis, fibromyalgia, Bipolar disorder, depression and OSAwho follows up for seizure disorder, migraines and questionable multiple sclerosis.  She is accompanied by a companion who supplements history.  UPDATE: EEG on 05/05/2019 was normal.  Therefore, Keppra was not restarted.  Neuropsychological testing on 09/07/2019 demonstrated diffuse cognitive impairment with unclear etiology.  However, given this decline over such a short period of time (over 9-10 months) and concurrent severe anxiety and moderate depression, her psychological comorbidities may be the primary factor.  Given that she reports some difficulties in performing ADLs independently, she at least meets diagnostic criteria for mild neurocognitive disorder.    ***  HISTORY: I SEIZURE DISORDER: She has had history of seizures since childhood. Previously took phenobarbital until age 34. She was seizure-free for years until December 2014, in which she had 2 suspected seizures followed by a third seizure in February 2015. She was noted by her sister to be unresponsive,shaking with eyes rolled backwith tongue biting lasting 3 minutes. No bowel or bladder incontinence. Afterwards, she was confused and agitated. She was started on Keppra 500mg  twice daily. No recurrent seizures since 2015.  Keppra was subsequently discontinued after several years.  She had a repeat MRI of brain on 01/20/2019 which demonstrated mild generalized atrophy and again mild nonspecific cerebral white matter disease, similar to prior MRI from 10/18/2013.   II QUESTIONABLE HISTORY OF MULTIPLE SCLEROSIS, UNLIKELY: [place], unsteady gait and difficulty with fine motor skills.  She took Betaseron until 2005. MRI of brain with and without contrast from 11/03/04 reportedly demonstrated "no lesion indentified in the brain which would be characteristic for multiple sclerosis."MRI of brain with and without contrast from 01/29/11 was personally reviewed and demonstrated mild non-enhancing nonspecific punctate T2 hyperintensities in the subcortical white matter, mildly increased on repeat study on 10/18/13.She was admitted to [facility] on 09/11/16 for bilateral lower extremity weakness with LLQ abdominal pain and nausea. MRI of cervical, thoracic and lumbar spine were personally reviewed and demonstrated no demyelinating spinal cord disease with mild disc bulging but no significant spinal stenosis. HIV testingwas negative.  III SUBJECTIVE MEMORY DEFICITS: She started having short-term memory problems in December 2019, however I presume symptoms started earlier. At the time, her family noticed the problem. She will lose her train of thought mid-sentence or she will forget why she walked into a room. She is told that she keeps repeating questions. She lives with her sister. Her sister took away her car keys because she got confused driving on a familiar route. One time, she gave a cab driver an extra [id number]. She is now having trouble paying her bills and requires help from her sister. She reports needing help bathing and getting dress. She started putting on clothes backwards. Her daughter got married in Spring 2019 and she cannot remember the wedding. She reports hallucinations. She woke up from a dream and turned over to briefly find a baby in her bed.  She underwent neuropsychological testing at [facility] on5/20/20. She did demonstrated reduced effort but no clear evidence of Alzheimer's disease or impairment often associated with MS. Likely subjective memory deficits related to psychogenic factors but ultimately unclear.  Labs from 07/20/18 include B12 479, folate  17.8, TSH 0.43. CBC and CMP were unremarkable.  Both of her  parents had Alzheimer's disease.             IV HEADACHES: She has history of migraines but headaches started getting worse in September 2019. She reports severe frontal or temporal pressure pain. No preceding aura. There is associated nausea, photophobia but no vomiting, phonophobia, visual disturbance or unilateral numbness or weakness. They usually last 3 to 4 hours. They are infrequent and she hasn't had one in several months. She usually treats it with Motrin and Flexeril.  Current NSAIDs:Motrin Current analgesics: none Current muscle relaxant: Flexeril Current antihypertensive: metoprolol Current antidepressant: Doxepin 20mg  at bedtime, Prozac Current antiepileptic: Lamotrigine 25mg  at bedtime Current sleep aid: Trazodone Other medications: Trazodone, Stelazine, Cogentin  Past antiemetic: Reglan Past muscle relaxant: Robaxin Past antidepressant: Cymbalta, amitriptyline Past antiepileptic: topiramate 100mg  twice daily, gabapentin Other past medications: Latuda, Seroquel, Sonata, Ambien  PAST MEDICAL HISTORY: Past Medical History:  Diagnosis Date  . Anemia   . Arthritis   . Arthropathy, unspecified, site unspecified   . Asthma   . Backache, unspecified   . Bipolar I disorder 06/13/2008   Qualifier: Diagnosis of  By: Burt Knack CMA, Cecille Rubin    . Dysphagia, pharyngoesophageal phase 09/12/2008   Qualifier: Diagnosis of  By: Joya Gaskins MD, Burnett Harry   . Fibromyalgia   . Generalized anxiety disorder 04/05/2012  . GERD (gastroesophageal reflux disease) 07/05/2008   Qualifier: Diagnosis of  By: Joya Gaskins MD, Burnett Harry   . Heart disease 12/19/2018  . Hypertension   . Intractable migraine with aura 11/03/2013  . Lumbar radiculopathy 08/30/2012  . Major depressive disorder 09/08/2019  . Major depressive disorder, recurrent, severe with psychotic features 04/05/2012  . Mild neurocognitive disorder, unclear etiology  09/08/2019  . Multiple sclerosis 11/03/2013   remission at present(past hx. bedridden) -ambulates with cane-left side weaker  . Obstructive sleep apnea 06/13/2008   no cpap-uses Oxygen nightly 2l/m   . On home oxygen therapy    nightly at 2l/m  . Other abnormal glucose    05-19-13 no problems now  . Seizure disorder    last seizure 2014  . Thyroid disease 10/03/2013    MEDICATIONS: Current Outpatient Medications on File Prior to Visit  Medication Sig Dispense Refill  . albuterol (PROAIR HFA) 108 (90 Base) MCG/ACT inhaler INHALE 2 PUFFS BY MOUTH EVERY 4 HOURS AS NEEDED FOR WHEEZING OR SHORTNESS OF BREATH 18 g 12  . benztropine (COGENTIN) 0.5 MG tablet Take 5 mg by mouth daily.    . cyclobenzaprine (FLEXERIL) 10 MG tablet Take 10 mg by mouth 3 (three) times daily as needed.  1  . doxepin (SINEQUAN) 10 MG capsule Take 20 mg by mouth at bedtime.  2  . EPINEPHrine (EPI-PEN) 0.3 mg/0.3 mL DEVI Inject 0.3 mg into the muscle once. As needed for severe allergic reaction    . famotidine (PEPCID) 20 MG tablet Take 1 tablet (20 mg total) by mouth 2 (two) times daily. For stomach acid. 60 tablet 0  . FLUoxetine (PROZAC) 20 MG capsule Take 20 mg by mouth daily.    . Fluticasone-Salmeterol (ADVAIR DISKUS) 250-50 MCG/DOSE AEPB Inhale 1 puf, then rinse mouth twice daily maintenance 60 each 12  . hydrOXYzine (ATARAX/VISTARIL) 25 MG tablet Take 25 mg by mouth 3 (three) times daily.  2  . ipratropium-albuterol (DUONEB) 0.5-2.5 (3) MG/3ML SOLN 1 neb every 6 hours if needed 75 mL 12  . lamoTRIgine (LAMICTAL) 25 MG tablet Take 25 mg by mouth at bedtime.     . metoprolol succinate (TOPROL-XL) 50  MG 24 hr tablet Take 1 tablet (50 mg total) by mouth daily. Take with or immediately following a meal. 90 tablet 3  . Multiple Vitamin (MULTIVITAMIN WITH MINERALS) TABS Take 1 tablet by mouth daily. For nutritional supplementation. 30 tablet 0  . polyethylene glycol (MIRALAX / GLYCOLAX) packet Take 17 g by mouth daily as  needed for mild constipation. 14 each 0  . traZODone (DESYREL) 150 MG tablet Take 150 mg by mouth at bedtime.    Marland Kitchen trifluoperazine (STELAZINE) 2 MG tablet Take 2 mg by mouth at bedtime.    . Vitamin D, Ergocalciferol, (DRISDOL) 1.25 MG (50000 UNIT) CAPS capsule Take 1 capsule (50,000 Units total) by mouth every 7 (seven) days. 12 capsule 0   No current facility-administered medications on file prior to visit.    ALLERGIES: Allergies  Allergen Reactions  . Bee Venom Anaphylaxis  . Depakote [Divalproex Sodium] Shortness Of Breath and Rash  . Haloperidol Lactate Swelling  . Latex Anaphylaxis    Other reaction(s): mild rash/itching, wheezing/sob  . Peanut-Containing Drug Products Anaphylaxis  . Penicillins Anaphylaxis and Swelling    Other reaction(s): Other (See Comments)  . Quetiapine Anaphylaxis and Other (See Comments)  . Shellfish-Derived Products Anaphylaxis  . Valproic Acid Swelling    Other reaction(s): unknown  . Amitriptyline Hcl Other (See Comments)    REACTION: HALLUCINATIONS  . Butorphanol Tartrate Other (See Comments)    REACTION: Hallucinations  . Gabapentin Other (See Comments)    Pt reports severe back and side pain  . Hornet Venom   . Iodine     REACTION: Flushing and fainting  . Terbutaline Other (See Comments)  . Pregabalin Rash    FAMILY HISTORY: Family History  Problem Relation Age of Onset  . Heart disease Father   . Ovarian cancer Mother   . Breast cancer Mother   . Alzheimer's disease Sister 41  . Colon cancer Neg Hx   . Esophageal cancer Neg Hx   . Stomach cancer Neg Hx   . Rectal cancer Neg Hx    ***.  SOCIAL HISTORY: Social History   Socioeconomic History  . Marital status: Divorced    Spouse name: Not on file  . Number of children: 3  . Years of education: 82  . Highest education level: Some college, no degree  Occupational History  . Occupation: Disabled  Tobacco Use  . Smoking status: Former Smoker    Packs/day: 1.00    Years:  5.00    Pack years: 5.00    Quit date: 07/07/2007    Years since quitting: 12.2  . Smokeless tobacco: Never Used  . Tobacco comment: 06/18 pt states she was an occasional smoker  Substance and Sexual Activity  . Alcohol use: No  . Drug use: No  . Sexual activity: Not Currently    Birth control/protection: Surgical    Comment: X 1 year  Other Topics Concern  . Not on file  Social History Narrative   Single, 3 children   Right handed   Associates degree   occ 1 cup daily      09/01/2018: Lives with sister, Rise Paganini, who helps manage medications, in 2 story house, and a pit bull   Has one daughter in [place], 2 sons in [place]; 7 grandchildren   Currently not exercising, but wants to return to water aerobics at [place] Strain:   . Difficulty of Paying Living Expenses:   Food Insecurity:   .  Worried About Charity fundraiser in the Last Year:   . Arboriculturist in the Last Year:   Transportation Needs:   . Film/video editor (Medical):   Marland Kitchen Lack of Transportation (Non-Medical):   Physical Activity:   . Days of Exercise per Week:   . Minutes of Exercise per Session:   Stress:   . Feeling of Stress :   Social Connections:   . Frequency of Communication with Friends and Family:   . Frequency of Social Gatherings with Friends and Family:   . Attends Religious Services:   . Active Member of Clubs or Organizations:   . Attends Archivist Meetings:   Marland Kitchen Marital Status:   Intimate Partner Violence:   . Fear of Current or Ex-Partner:   . Emotionally Abused:   Marland Kitchen Physically Abused:   . Sexually Abused:     REVIEW OF SYSTEMS: Constitutional: No fevers, chills, or sweats, no generalized fatigue, change in appetite Eyes: No visual changes, double vision, eye pain Ear, nose and throat: No hearing loss, ear pain, nasal congestion, sore throat Cardiovascular: No chest pain, palpitations Respiratory:  No shortness of breath at rest  or with exertion, wheezes GastrointestinaI: No nausea, vomiting, diarrhea, abdominal pain, fecal incontinence Genitourinary:  No dysuria, urinary retention or frequency Musculoskeletal:  No neck pain, back pain Integumentary: No rash, pruritus, skin lesions Neurological: as above Psychiatric: No depression, insomnia, anxiety Endocrine: No palpitations, fatigue, diaphoresis, mood swings, change in appetite, change in weight, increased thirst Hematologic/Lymphatic:  No purpura, petechiae. Allergic/Immunologic: no itchy/runny eyes, nasal congestion, recent allergic reactions, rashes  PHYSICAL EXAM: *** General: No acute distress.  Patient appears ***-groomed.   Head:  Normocephalic/atraumatic Eyes:  Fundi examined but not visualized Neck: supple, no paraspinal tenderness, full range of motion Heart:  Regular rate and rhythm Lungs:  Clear to auscultation bilaterally Back: No paraspinal tenderness Neurological Exam: alert and oriented to person, place, and time. Attention span and concentration intact, recent and remote memory intact, fund of knowledge intact.  Speech fluent and not dysarthric, language intact.  CN II-XII intact. Bulk and tone normal, muscle strength 5/5 throughout.  Sensation to light touch, temperature and vibration intact.  Deep tendon reflexes 2+ throughout, toes downgoing.  Finger to nose and heel to shin testing intact.  Gait normal, Romberg negative.  IMPRESSION: ***  PLAN: ***  Metta Clines, DO  CC: ***

## 2019-10-10 ENCOUNTER — Ambulatory Visit: Payer: Medicare Other | Admitting: Neurology

## 2019-10-10 DIAGNOSIS — M533 Sacrococcygeal disorders, not elsewhere classified: Secondary | ICD-10-CM | POA: Diagnosis not present

## 2019-10-10 DIAGNOSIS — G8929 Other chronic pain: Secondary | ICD-10-CM | POA: Diagnosis not present

## 2019-10-10 DIAGNOSIS — M5441 Lumbago with sciatica, right side: Secondary | ICD-10-CM | POA: Diagnosis not present

## 2019-10-10 DIAGNOSIS — G894 Chronic pain syndrome: Secondary | ICD-10-CM | POA: Diagnosis not present

## 2019-10-10 DIAGNOSIS — Z9189 Other specified personal risk factors, not elsewhere classified: Secondary | ICD-10-CM | POA: Diagnosis not present

## 2019-10-10 DIAGNOSIS — M7918 Myalgia, other site: Secondary | ICD-10-CM | POA: Diagnosis not present

## 2019-10-13 ENCOUNTER — Telehealth: Payer: Self-pay | Admitting: Family Medicine

## 2019-10-13 NOTE — Telephone Encounter (Signed)
Pt returned phone call about lab results, would like a call back.

## 2019-10-13 NOTE — Telephone Encounter (Signed)
Attempted to call pt back left a message to call the office

## 2019-10-17 NOTE — Telephone Encounter (Signed)
Reviewed lab results with pt verbalized understanding of her lab results and Dr Volanda Napoleon recommendations

## 2019-10-17 NOTE — Telephone Encounter (Signed)
Patient is calling to get results of labs that were done last week.

## 2019-11-09 ENCOUNTER — Other Ambulatory Visit: Payer: Self-pay | Admitting: Family Medicine

## 2019-11-09 DIAGNOSIS — E559 Vitamin D deficiency, unspecified: Secondary | ICD-10-CM

## 2019-11-11 ENCOUNTER — Other Ambulatory Visit: Payer: Self-pay | Admitting: Family Medicine

## 2019-11-16 ENCOUNTER — Telehealth: Payer: Self-pay | Admitting: Family Medicine

## 2019-11-16 ENCOUNTER — Telehealth: Payer: Self-pay

## 2019-11-16 NOTE — Telephone Encounter (Signed)
Pt called the office requesting for a Prescription for Ibuprofen 800 mg, Medication is not on pt list of medications but pt state that her Orthopedic doctor prescribed Rx and  has been providing medication refills but since she has completed getting Injections, the orthopedic doctor advised pt  to continue getting refills from Dr Volanda Napoleon who is pt PCP. Pt has an appointment scheduled for Monday 11/20/2019.Pt was advised that Dr Volanda Napoleon will review this concern with pt at her upcoming appointment.

## 2019-11-16 NOTE — Telephone Encounter (Signed)
Error/disregard

## 2019-11-20 ENCOUNTER — Encounter: Payer: Self-pay | Admitting: Family Medicine

## 2019-11-20 ENCOUNTER — Other Ambulatory Visit: Payer: Self-pay

## 2019-11-20 ENCOUNTER — Ambulatory Visit (INDEPENDENT_AMBULATORY_CARE_PROVIDER_SITE_OTHER): Payer: Medicare HMO | Admitting: Family Medicine

## 2019-11-20 VITALS — BP 142/80 | HR 78 | Temp 97.2°F | Wt 271.0 lb

## 2019-11-20 DIAGNOSIS — G3184 Mild cognitive impairment, so stated: Secondary | ICD-10-CM | POA: Diagnosis not present

## 2019-11-20 DIAGNOSIS — M545 Low back pain: Secondary | ICD-10-CM

## 2019-11-20 DIAGNOSIS — G8929 Other chronic pain: Secondary | ICD-10-CM | POA: Diagnosis not present

## 2019-11-20 DIAGNOSIS — I10 Essential (primary) hypertension: Secondary | ICD-10-CM | POA: Diagnosis not present

## 2019-11-20 NOTE — Patient Instructions (Addendum)
It is important that you take your blood pressure medication daily.  It may help to put your medicines in a medication planner.    You should schedule a follow-up appointment with your orthopedic doctor to further discuss your back pain.  If there is any recent imaging they can send that to the neurosurgeon if you wish to have a second opinion.    You should use ibuprofen sparingly.  When you do take it make sure you take it with food and not on an empty stomach.  You should continue to follow-up with your neurologist.   Chronic Back Pain When back pain lasts longer than 3 months, it is called chronic back pain.The cause of your back pain may not be known. Some common causes include:  Wear and tear (degenerative disease) of the bones, ligaments, or disks in your back.  Inflammation and stiffness in your back (arthritis). People who have chronic back pain often go through certain periods in which the pain is more intense (flare-ups). Many people can learn to manage the pain with home care. Follow these instructions at home: Pay attention to any changes in your symptoms. Take these actions to help with your pain: Activity   Avoid bending and other activities that make the problem worse.  Maintain a proper position when standing or sitting: ? When standing, keep your upper back and neck straight, with your shoulders pulled back. Avoid slouching. ? When sitting, keep your back straight and relax your shoulders. Do not round your shoulders or pull them backward.  Do not sit or stand in one place for long periods of time.  Take brief periods of rest throughout the day. This will reduce your pain. Resting in a lying or standing position is usually better than sitting to rest.  When you are resting for longer periods, mix in some mild activity or stretching between periods of rest. This will help to prevent stiffness and pain.  Get regular exercise. Ask your health care provider what  activities are safe for you.  Do not lift anything that is heavier than 10 lb (4.5 kg). Always use proper lifting technique, which includes: ? Bending your knees. ? Keeping the load close to your body. ? Avoiding twisting.  Sleep on a firm mattress in a comfortable position. Try lying on your side with your knees slightly bent. If you lie on your back, put a pillow under your knees. Managing pain  If directed, apply ice to the painful area. Your health care provider may recommend applying ice during the first 24-48 hours after a flare-up begins. ? Put ice in a plastic bag. ? Place a towel between your skin and the bag. ? Leave the ice on for 20 minutes, 2-3 times per day.  If directed, apply heat to the affected area as often as told by your health care provider. Use the heat source that your health care provider recommends, such as a moist heat pack or a heating pad. ? Place a towel between your skin and the heat source. ? Leave the heat on for 20-30 minutes. ? Remove the heat if your skin turns bright red. This is especially important if you are unable to feel pain, heat, or cold. You may have a greater risk of getting burned.  Try soaking in a warm tub.  Take over-the-counter and prescription medicines only as told by your health care provider.  Keep all follow-up visits as told by your health care provider. This is important.  Contact a health care provider if:  You have pain that is not relieved with rest or medicine. Get help right away if:  You have weakness or numbness in one or both of your legs or feet.  You have trouble controlling your bladder or your bowels.  You have nausea or vomiting.  You have pain in your abdomen.  You have shortness of breath or you faint. This information is not intended to replace advice given to you by your health care provider. Make sure you discuss any questions you have with your health care provider. Document Revised: 10/13/2018  Document Reviewed: 12/30/2016 Elsevier Patient Education  [place].  What You Need to Know About Chronic Back Pain Long-term (chronic) back pain is back pain that lasts for 12 weeks or longer. It often affects the lower back and can range from mild to severe. Many people have back pain at some point in their lives. It can feel different to each person. It may feel like a muscle ache or a sharp, stabbing pain. The pain often gets worse over time. It can be difficult to find the cause of chronic back pain. Treating chronic back pain often starts with rest and pain relief, followed by exercises (physical therapy) to strengthen the muscles that support your back. You may have to try different things to see what works best for you. If other treatments do not help, or if your pain is caused by a condition or an injury, you may need surgery. How can back pain affect me? Chronic back pain is uncomfortable and can make it hard to do your usual daily activities. Chronic back pain can:  Cause numbness and tingling.  Come and go.  Get worse when you are sitting, standing, walking, bending, or lifting.  Affect you while you are active, at rest, or both.  Eventually make it hard to move around.  Occur with fever, weight loss, or difficulty urinating. What are the benefits of treating back pain? Treating chronic back pain may:  Relieve pain.  Keep your pain from getting worse.  Make it easier for you to do your usual activities. What are some steps I can take to decrease my back pain?   Take over-the-counter or prescription medicines only as told by your health care provider.  If directed, apply heat to the affected area. Use the heat source that your health care provider recommends, such as a moist heat pack or a heating pad. ? Place a towel between your skin and the heat source. ? Leave the heat on for 20-30 minutes. ? Remove the heat if your skin turns bright red. This is especially  important if you are unable to feel pain, heat, or cold. You may have a greater risk of getting burned.  If directed, put ice on the affected area: ? Put ice in a plastic bag. ? Place a towel between your skin and the bag. ? Leave the ice on for 20 minutes, 2-3 times a day.  Get regular exercise as told by your health care provider to improve flexibility and strength.  Do not smoke.  Maintain a healthy weight.  When lifting objects: ? Keep your feet as far apart as your shoulders (shoulder-width apart) or farther apart. ? Tighten the muscles in your abdomen. ? Bend your knees and hips and keep your spine neutral. It is important to lift using the strength of your legs, not your back. Do not lock your knees straight out. ? Always  ask for help to lift heavy or awkward objects. What can happen if my back pain goes untreated? Untreated back pain can:  Get worse over time.  Start to occur more often or at different times, such as when you are resting.  Cause posture problems.  Make it hard to move around (limit mobility). Where can I get support? Chronic back pain can be a frustrating condition to manage. It may help to talk with other people who are having a similar experience. Consider joining a support group for people dealing with chronic back pain. Ask your health care provider about support groups in your area. You can also find online and in-person support groups through:  The American Chronic Pain Association: DeluxeOption.si  The U.S. Pain Foundation: uspainfoundation.org/support-groups Contact a health care provider if:  Your symptoms do not get better or they get worse.  You have severe back pain.  You have chronic back pain and a fever.  You lose weight without trying.  You have difficulty urinating.  You experience numbness or tingling.  You develop new pain after an injury. Summary  Chronic back pain is often treated with rest, pain  relief, and physical therapy.  Get regular exercise to improve your strength and flexibility.  Put heat and ice on the affected areas as directed by your health care provider.  Chronic back pain can be challenging to live with. Joining a support group may help you manage your condition. This information is not intended to replace advice given to you by your health care provider. Make sure you discuss any questions you have with your health care provider. Document Revised: 06/04/2017 Document Reviewed: 02/29/2016 Elsevier Patient Education  2020 [employer].

## 2019-11-20 NOTE — Progress Notes (Signed)
Subjective:    Patient ID: [name], female    DOB: 1960/11/07, 59 y.o.   MRN: [id number]  No chief complaint on file.   HPI Pt is a 59 yo female with pmh sig for heart disease, HTN, OSA, migraines, asthma, allergies, GERD, thyroid disease, seizure d/o, questionable MS, DDD, lumbar radiculopathy, bipolar 1 d/o, GAD, fibromyalgia, HLD, obesity, chronic back pain who was seen for follow-up.  Pt s/p neuropsych testing in 09/2019.  Per pt her "mind is older than [her] age".  Pt endorses continued chronic back pain 2/2 "bulging disc" in lumbar spine.  Pain noted as 7/10, stabbing, feels like a "twisted muscle ", irritable feeling.  Seen in the past by Dr. Lyda Kalata for spinal injections but states they have not helped.  Last injection was in March per pt.  Water aerobics helps.  Pt states her back hurts more with increased walking.  Has to hunch forward when walking. Requesting referral to surgeon.  Pt notes bp was elevated over the wknd as she forgot to take her meds.  BP was 160/100.  Pt notes feeling "funny"/flushed at that time.  States having to get help with her meds, bathing, and getting dressed.  Per chart review pt requiring help with ADLs since 2019.  Past Medical History:  Diagnosis Date  . Anemia   . Arthritis   . Arthropathy, unspecified, site unspecified   . Asthma   . Backache, unspecified   . Bipolar I disorder 06/13/2008   Qualifier: Diagnosis of  By: Burt Knack CMA, Cecille Rubin    . Dysphagia, pharyngoesophageal phase 09/12/2008   Qualifier: Diagnosis of  By: Joya Gaskins MD, Burnett Harry   . Fibromyalgia   . Generalized anxiety disorder 04/05/2012  . GERD (gastroesophageal reflux disease) 07/05/2008   Qualifier: Diagnosis of  By: Joya Gaskins MD, Burnett Harry   . Heart disease 12/19/2018  . Hypertension   . Intractable migraine with aura 11/03/2013  . Lumbar radiculopathy 08/30/2012  . Major depressive disorder 09/08/2019  . Major depressive disorder, recurrent, severe with psychotic features  04/05/2012  . Mild neurocognitive disorder, unclear etiology 09/08/2019  . Multiple sclerosis 11/03/2013   remission at present(past hx. bedridden) -ambulates with cane-left side weaker  . Obstructive sleep apnea 06/13/2008   no cpap-uses Oxygen nightly 2l/m   . On home oxygen therapy    nightly at 2l/m  . Other abnormal glucose    05-19-13 no problems now  . Seizure disorder    last seizure 2014  . Thyroid disease 10/03/2013    Allergies  Allergen Reactions  . Bee Venom Anaphylaxis  . Depakote [Divalproex Sodium] Shortness Of Breath and Rash  . Haloperidol Lactate Swelling  . Latex Anaphylaxis    Other reaction(s): mild rash/itching, wheezing/sob  . Peanut-Containing Drug Products Anaphylaxis  . Penicillins Anaphylaxis and Swelling    Other reaction(s): Other (See Comments)  . Quetiapine Anaphylaxis and Other (See Comments)  . Shellfish-Derived Products Anaphylaxis  . Valproic Acid Swelling    Other reaction(s): unknown  . Amitriptyline Hcl Other (See Comments)    REACTION: HALLUCINATIONS  . Butorphanol Tartrate Other (See Comments)    REACTION: Hallucinations  . Gabapentin Other (See Comments)    Pt reports severe back and side pain  . Hornet Venom   . Iodine     REACTION: Flushing and fainting  . Terbutaline Other (See Comments)  . Pregabalin Rash    ROS General: Denies fever, chills, night sweats, changes in weight, changes in appetite + memory deficit HEENT:  Denies headaches, ear pain, changes in vision, rhinorrhea, sore throat CV: Denies CP, palpitations, SOB, orthopnea Pulm: Denies SOB, cough, wheezing GI: Denies abdominal pain, nausea, vomiting, diarrhea, constipation GU: Denies dysuria, hematuria, frequency, vaginal discharge Msk: Denies muscle cramps, joint pains  + chronic back pain Neuro: Denies weakness, numbness, tingling Skin: Denies rashes, bruising Psych: Denies hallucinations + depression, anxiety    Objective:    Blood pressure (!) 142/80, pulse  78, temperature (!) 97.2 F (36.2 C), temperature source Temporal, weight 271 lb (122.9 kg), SpO2 98 %.  Gen. Pleasant, well-nourished, in no distress, normal affect   HEENT: [place]/AT, face symmetric, conjunctiva clear, no scleral icterus, PERRLA, EOMI, nares patent without drainage Lungs: no accessory muscle use, CTAB, no wheezes or rales Cardiovascular: RRR, no m/r/g, no peripheral edema Musculoskeletal: TTP out of proportion to exam.  Extremely TTP to very light touch of thoracic, lumbar spine, and paraspinal muscles.  No deformities, no cyanosis or clubbing, normal tone Neuro:  A&Ox3, CN II-XII intact, normal gait Skin:  Warm, no lesions/ rash   Wt Readings from Last 3 Encounters:  11/20/19 271 lb (122.9 kg)  10/04/19 267 lb 9.6 oz (121.4 kg)  05/02/19 267 lb 9.6 oz (121.4 kg)    Lab Results  Component Value Date   WBC 5.4 10/04/2019   HGB 13.1 10/04/2019   HCT 40.4 10/04/2019   PLT 314.0 10/04/2019   GLUCOSE 93 10/04/2019   CHOL 230 (H) 10/04/2019   TRIG 73.0 10/04/2019   HDL 62.80 10/04/2019   LDLCALC 152 (H) 10/04/2019   ALT 17 07/20/2018   AST 15 07/20/2018   NA 137 10/04/2019   K 3.8 10/04/2019   CL 101 10/04/2019   CREATININE 0.93 10/04/2019   BUN 12 10/04/2019   CO2 27 10/04/2019   TSH 0.43 07/20/2018   INR 0.99 12/25/2011   HGBA1C 6.1 10/04/2019    Assessment/Plan:  Mild neurocognitive disorder, unclear etiology -ongoing issue. -per chart review symptoms may have started in 2019.  Seen by Neurology and had previous neuropsych testing in [place] on 11/23/18.  Testing demonstrated reduced effort, but no clear evidence of Alzheimer's or impairment a/w MS. -Anxiety and depression may be factors and cognitive deficit.  Also consider seizure hx. -Follow-up with psychiatry for medication adjustment strongly encouraged -Continue follow-up with neurology  Chronic midline low back pain without sciatica -Recent imaging not available in system. -Discussed supportive  care -Consider acupuncture and alternative medicine -Would advise patient to follow-up with Ortho in regards to possible referral to neurosurgery. -Pain management per Ortho  Essential hypertension -Mildly elevated in clinic -Discussed lifestyle modifications -Question medication compliance.  Though living with family may benefit from [facility] for medication assistance -Continue Toprol-XL 50 mg -For continued elevation in BP greater than 140/90 we will need to make medication adjustments. -Continue checking BP at home  F/u as needed in the next few months.  Grier Mitts, MD

## 2019-11-23 ENCOUNTER — Other Ambulatory Visit: Payer: Self-pay | Admitting: Family Medicine

## 2019-11-23 DIAGNOSIS — R69 Illness, unspecified: Secondary | ICD-10-CM | POA: Diagnosis not present

## 2019-11-24 ENCOUNTER — Encounter: Payer: Self-pay | Admitting: Family Medicine

## 2019-12-06 DIAGNOSIS — M545 Low back pain: Secondary | ICD-10-CM | POA: Diagnosis not present

## 2019-12-08 ENCOUNTER — Telehealth: Payer: Self-pay | Admitting: Neurology

## 2019-12-08 NOTE — Telephone Encounter (Signed)
Recv'd records from [facility] forwarded 3 pages to Dr. Tomi Likens 6/4/21fbg

## 2019-12-12 ENCOUNTER — Other Ambulatory Visit: Payer: Self-pay | Admitting: Orthopedic Surgery

## 2019-12-12 DIAGNOSIS — M545 Low back pain, unspecified: Secondary | ICD-10-CM

## 2020-01-03 ENCOUNTER — Other Ambulatory Visit: Payer: Self-pay

## 2020-01-03 ENCOUNTER — Encounter: Payer: Self-pay | Admitting: Family Medicine

## 2020-01-03 ENCOUNTER — Ambulatory Visit (INDEPENDENT_AMBULATORY_CARE_PROVIDER_SITE_OTHER): Payer: Medicare HMO | Admitting: Family Medicine

## 2020-01-03 VITALS — BP 130/80 | HR 77 | Temp 97.8°F | Wt 278.6 lb

## 2020-01-03 DIAGNOSIS — I10 Essential (primary) hypertension: Secondary | ICD-10-CM

## 2020-01-03 DIAGNOSIS — M4727 Other spondylosis with radiculopathy, lumbosacral region: Secondary | ICD-10-CM | POA: Diagnosis not present

## 2020-01-03 MED ORDER — IBUPROFEN 800 MG PO TABS: 800.0000 mg | ORAL_TABLET | Freq: Three times a day (TID) | ORAL | 0 refills | Status: DC | PRN

## 2020-01-03 NOTE — Patient Instructions (Signed)
Spondylolysis  Spondylolysis is a small break or crack (stress fracture) in a bone in the spine (vertebra) in the lower back (lumbar spine). The stress fracture occurs on the bony mass between and behind the vertebra. Spondylolysis may be caused by an injury (trauma) or by overuse. Since the lower back is almost always under pressure from daily living, this stress fracture usually does not heal normally. Spondylolysis may eventually cause one vertebra to slip forward and out of place (spondylolisthesis). What are the causes? This condition may be caused by:  Trauma, such as a fall.  Excessive wear and tear. This is often a result of doing sports or physical activities that involve repetitive overstretching (hyperextension) and rotation of the spine. What increases the risk?  You are more likely to develop this condition if you have: ? A family history of this condition. ? An inward curvature of your spine (lordosis). ? A condition that affects your spine, such as spina bifida. You are more likely to develop this condition if you participate in:  Gymnastics.  Dance.  Football.  Wrestling.  Martial arts.  Weight lifting.  Tennis.  Swimming. What are the signs or symptoms? Symptoms of this condition may include:  Long-lasting (chronic) pain in the lower back.  Stiffness in the back or legs.  Tightness in the hamstring muscles, which are in the backs of the thighs. In some cases, there may be no symptoms of this condition. How is this diagnosed? This condition may be diagnosed based on:  Your symptoms.  Your medical history.  A physical exam.  Imaging tests, such as: ? X-rays. ? CT scan. ? MRI. How is this treated? This condition may be treated by:  Resting. You may be asked to avoid or modify activities that put strain on your back until your symptoms improve.  Medicines to help relieve pain.  NSAIDs to help reduce swelling and discomfort.  Injections of  medicine (cortisone) in your back. These injections can help to relieve pain and numbness.  A brace to stabilize and support your back.  Physical therapy. You may work with an occupational therapist or physical therapist who can teach you how to reduce pressure on your back while you do everyday activities.  Surgery. This may be needed if you have: ? A severe injury. ? Pain that lasts for more than 6 months. ? Numbness in your pelvic region. ? Changes in control of your stool or urine. Follow these instructions at home: Medicines  Take over-the-counter and prescription medicines only as told by your health care provider.  Ask your health care provider if the medicine prescribed to you: ? Requires you to avoid driving or using heavy machinery. ? Can cause constipation. You may need to take these actions to prevent or treat constipation:  Drink enough fluid to keep your urine pale yellow.  Take over-the-counter or prescription medicines.  Eat foods that are high in fiber, such as beans, whole grains, and fresh fruits and vegetables.  Limit foods that are high in fat and processed sugars, such as fried or sweet foods. If you have a brace:  Wear the brace as told by your health care provider. Remove it only as told by your health care provider.  Keep the brace clean.  If the brace is not waterproof: ? Do not let it get wet. ? Cover it with a watertight covering when you take a bath or a shower. Activity  Rest and return to your normal activities as told  by your health care provider. Ask your health care provider what activities are safe for you.  Ask your health care provider when it is safe to drive if you have a back brace.  Work with a physical therapist to make a safe exercise program, as recommended by your health care provider. Do exercises as told by your physical therapist. This may include exercises to strengthen your back and abdominal muscles (core  exercises). Managing pain, stiffness, and swelling      If directed, put ice on the affected area. ? If you have a removable brace, remove it as told by your health care provider. ? Put ice in a plastic bag. ? Place a towel between your skin and the bag. ? Leave the ice on for 20 minutes, 2-3 times a day.  If directed, apply heat to the affected area as often as told by your health care provider. Use the heat source that your health care provider recommends, such as a moist heat pack or a heating pad. ? If you have a removable brace, remove it as told by your health care provider. ? Place a towel between your skin and the heat source. ? Leave the heat on for 20-30 minutes. ? Remove the heat if your skin turns bright red. This is especially important if you are unable to feel pain, heat, or cold. You may have a greater risk of getting burned. General instructions  Do not use any products that contain nicotine or tobacco, such as cigarettes, e-cigarettes, and chewing tobacco. These can delay bone healing. If you need help quitting, ask your health care provider.  Maintain a healthy weight. Extra weight puts stress on your back.  Keep all follow-up visits as told by your health care provider. This is important. Contact a health care provider if:  You have pain that gets worse or does not get better. Get help right away if:  You have severe back pain.  You have changes in control of your stool or urine.  You develop weakness or numbness in your legs.  You are unable to stand or walk. Summary  Spondylolysis is a small break or crack (stress fracture) in a bone in the spine (vertebra) in the lower back (lumbar spine).  This condition may be treated by resting, medicines, physical therapy, wearing a brace, or surgery.  Rest and return to your normal activities as told by your health care provider. Ask your health care provider what activities are safe for you.  Contact a health  care provider if you have pain that gets worse or does not get better. This information is not intended to replace advice given to you by your health care provider. Make sure you discuss any questions you have with your health care provider. Document Revised: 10/13/2018 Document Reviewed: 01/25/2018 Elsevier Patient Education  2020 [place] Your Hypertension Hypertension is commonly called high blood pressure. This is when the force of your blood pressing against the walls of your arteries is too strong. Arteries are blood vessels that carry blood from your heart throughout your body. Hypertension forces the heart to work harder to pump blood, and may cause the arteries to become narrow or stiff. Having untreated or uncontrolled hypertension can cause heart attack, stroke, kidney disease, and other problems. What are blood pressure readings? A blood pressure reading consists of a higher number over a lower number. Ideally, your blood pressure should be below 120/80. The first ("top") number is called the  systolic pressure. It is a measure of the pressure in your arteries as your heart beats. The second ("bottom") number is called the diastolic pressure. It is a measure of the pressure in your arteries as the heart relaxes. What does my blood pressure reading mean? Blood pressure is classified into four stages. Based on your blood pressure reading, your health care provider may use the following stages to determine what type of treatment you need, if any. Systolic pressure and diastolic pressure are measured in a unit called mm Hg. Normal  Systolic pressure: below 338.  Diastolic pressure: below 80. Elevated  Systolic pressure: 250-539.  Diastolic pressure: below 80. Hypertension stage 1  Systolic pressure: 767-341.  Diastolic pressure: 93-79. Hypertension stage 2  Systolic pressure: 024 or above.  Diastolic pressure: 90 or above. What health risks are associated with  hypertension? Managing your hypertension is an important responsibility. Uncontrolled hypertension can lead to:  A heart attack.  A stroke.  A weakened blood vessel (aneurysm).  Heart failure.  Kidney damage.  Eye damage.  Metabolic syndrome.  Memory and concentration problems. What changes can I make to manage my hypertension? Hypertension can be managed by making lifestyle changes and possibly by taking medicines. Your health care provider will help you make a plan to bring your blood pressure within a normal range. Eating and drinking   Eat a diet that is high in fiber and potassium, and low in salt (sodium), added sugar, and fat. An example eating plan is called the DASH (Dietary Approaches to Stop Hypertension) diet. To eat this way: ? Eat plenty of fresh fruits and vegetables. Try to fill half of your plate at each meal with fruits and vegetables. ? Eat whole grains, such as whole wheat pasta, brown rice, or whole grain bread. Fill about one quarter of your plate with whole grains. ? Eat low-fat diary products. ? Avoid fatty cuts of meat, processed or cured meats, and poultry with skin. Fill about one quarter of your plate with lean proteins such as fish, chicken without skin, beans, eggs, and tofu. ? Avoid premade and processed foods. These tend to be higher in sodium, added sugar, and fat.  Reduce your daily sodium intake. Most people with hypertension should eat less than 1,500 mg of sodium a day.  Limit alcohol intake to no more than 1 drink a day for nonpregnant women and 2 drinks a day for men. One drink equals 12 oz of beer, 5 oz of wine, or 1 oz of hard liquor. Lifestyle  Work with your health care provider to maintain a healthy body weight, or to lose weight. Ask what an ideal weight is for you.  Get at least 30 minutes of exercise that causes your heart to beat faster (aerobic exercise) most days of the week. Activities may include walking, swimming, or  biking.  Include exercise to strengthen your muscles (resistance exercise), such as weight lifting, as part of your weekly exercise routine. Try to do these types of exercises for 30 minutes at least 3 days a week.  Do not use any products that contain nicotine or tobacco, such as cigarettes and e-cigarettes. If you need help quitting, ask your health care provider.  Control any long-term (chronic) conditions you have, such as high cholesterol or diabetes. Monitoring  Monitor your blood pressure at home as told by your health care provider. Your personal target blood pressure may vary depending on your medical conditions, your age, and other factors.  Have your  blood pressure checked regularly, as often as told by your health care provider. Working with your health care provider  Review all the medicines you take with your health care provider because there may be side effects or interactions.  Talk with your health care provider about your diet, exercise habits, and other lifestyle factors that may be contributing to hypertension.  Visit your health care provider regularly. Your health care provider can help you create and adjust your plan for managing hypertension. Will I need medicine to control my blood pressure? Your health care provider may prescribe medicine if lifestyle changes are not enough to get your blood pressure under control, and if:  Your systolic blood pressure is 130 or higher.  Your diastolic blood pressure is 80 or higher. Take medicines only as told by your health care provider. Follow the directions carefully. Blood pressure medicines must be taken as prescribed. The medicine does not work as well when you skip doses. Skipping doses also puts you at risk for problems. Contact a health care provider if:  You think you are having a reaction to medicines you have taken.  You have repeated (recurrent) headaches.  You feel dizzy.  You have swelling in your  ankles.  You have trouble with your vision. Get help right away if:  You develop a severe headache or confusion.  You have unusual weakness or numbness, or you feel faint.  You have severe pain in your chest or abdomen.  You vomit repeatedly.  You have trouble breathing. Summary  Hypertension is when the force of blood pumping through your arteries is too strong. If this condition is not controlled, it may put you at risk for serious complications.  Your personal target blood pressure may vary depending on your medical conditions, your age, and other factors. For most people, a normal blood pressure is less than 120/80.  Hypertension is managed by lifestyle changes, medicines, or both. Lifestyle changes include weight loss, eating a healthy, low-sodium diet, exercising more, and limiting alcohol. This information is not intended to replace advice given to you by your health care provider. Make sure you discuss any questions you have with your health care provider. Document Revised: 10/14/2018 Document Reviewed: 05/20/2016 Elsevier Patient Education  [place].

## 2020-01-03 NOTE — Progress Notes (Signed)
Subjective:    Patient ID: [name], female    DOB: 08/21/1960, 59 y.o.   MRN: [id number]  No chief complaint on file.   HPI Patient was seen today for f/u.  Pt with continued back pain d/t spondylosis.  Seen by Ortho and pain management.  S/p back injections in April which have not helped.  Pain from midline low back radiates into R leg which feels weak at times.  Pt taking Ibuprofen and flexeril TID.  Request refills.  Tried tylenol which helps some, but does not make pain go away completely.  Pt notes ibuprofen 800 mg may last 4-6 hrs if she sits down.  Pt does not always take med with food.  Has an MRI scheduled in July.  Will have f/u with Ortho after that.  Trying to do some exercises at home.  Tried lidocaine patches, but they do not stay on.  Pt still having memory issues.  Seen by neurology.  mild neurocognitive d/o of unclear etiology suggested based on testing.    Pt notes bp has been "good".  Per memory 120s/60s?  Past Medical History:  Diagnosis Date  . Anemia   . Arthritis   . Arthropathy, unspecified, site unspecified   . Asthma   . Backache, unspecified   . Bipolar I disorder 06/13/2008   Qualifier: Diagnosis of  By: Burt Knack CMA, Cecille Rubin    . Dysphagia, pharyngoesophageal phase 09/12/2008   Qualifier: Diagnosis of  By: Joya Gaskins MD, Burnett Harry   . Fibromyalgia   . Generalized anxiety disorder 04/05/2012  . GERD (gastroesophageal reflux disease) 07/05/2008   Qualifier: Diagnosis of  By: Joya Gaskins MD, Burnett Harry   . Heart disease 12/19/2018  . Hypertension   . Intractable migraine with aura 11/03/2013  . Lumbar radiculopathy 08/30/2012  . Major depressive disorder 09/08/2019  . Major depressive disorder, recurrent, severe with psychotic features 04/05/2012  . Mild neurocognitive disorder, unclear etiology 09/08/2019  . Multiple sclerosis 11/03/2013   remission at present(past hx. bedridden) -ambulates with cane-left side weaker  . Obstructive sleep apnea 06/13/2008   no  cpap-uses Oxygen nightly 2l/m   . On home oxygen therapy    nightly at 2l/m  . Other abnormal glucose    05-19-13 no problems now  . Seizure disorder    last seizure 2014  . Thyroid disease 10/03/2013    Allergies  Allergen Reactions  . Bee Venom Anaphylaxis  . Depakote [Divalproex Sodium] Shortness Of Breath and Rash  . Haloperidol Lactate Swelling  . Latex Anaphylaxis    Other reaction(s): mild rash/itching, wheezing/sob  . Peanut-Containing Drug Products Anaphylaxis  . Penicillins Anaphylaxis and Swelling    Other reaction(s): Other (See Comments)  . Quetiapine Anaphylaxis and Other (See Comments)  . Shellfish-Derived Products Anaphylaxis  . Valproic Acid Swelling    Other reaction(s): unknown  . Amitriptyline Hcl Other (See Comments)    REACTION: HALLUCINATIONS  . Butorphanol Tartrate Other (See Comments)    REACTION: Hallucinations  . Gabapentin Other (See Comments)    Pt reports severe back and side pain  . Hornet Venom   . Iodine     REACTION: Flushing and fainting  . Terbutaline Other (See Comments)  . Pregabalin Rash    ROS General: Denies fever, chills, night sweats, changes in weight, changes in appetite  +memory deficit HEENT: Denies headaches, ear pain, changes in vision, rhinorrhea, sore throat CV: Denies CP, palpitations, SOB, orthopnea Pulm: Denies SOB, cough, wheezing GI: Denies abdominal pain, nausea, vomiting, diarrhea,  constipation GU: Denies dysuria, hematuria, frequency, vaginal discharge Msk: Denies muscle cramps, joint pains  +lumbar back pain with radiation into R leg. Neuro: Denies weakness, numbness, tingling Skin: Denies rashes, bruising Psych: Denies depression, anxiety, hallucinations     Objective:    Blood pressure 130/80, pulse 77, temperature 97.8 F (36.6 C), temperature source Temporal, weight 278 lb 9.6 oz (126.4 kg), SpO2 98 %.  Gen. Pleasant, well-nourished, in no distress, normal affect  HEENT: [place]/AT, face symmetric,  conjunctiva clear, no scleral icterus, PERRLA, EOMI, nares patent without drainage Lungs: no accessory muscle use Cardiovascular: RRR, no peripheral edema Musculoskeletal: TTP of midline lumbar spine.  No deformities, no cyanosis or clubbing, normal tone Neuro:  A&Ox3, CN II-XII intact, normal gait Skin:  Warm, no lesions/ rash   Wt Readings from Last 3 Encounters:  01/03/20 278 lb 9.6 oz (126.4 kg)  11/20/19 271 lb (122.9 kg)  10/04/19 267 lb 9.6 oz (121.4 kg)    Lab Results  Component Value Date   WBC 5.4 10/04/2019   HGB 13.1 10/04/2019   HCT 40.4 10/04/2019   PLT 314.0 10/04/2019   GLUCOSE 93 10/04/2019   CHOL 230 (H) 10/04/2019   TRIG 73.0 10/04/2019   HDL 62.80 10/04/2019   LDLCALC 152 (H) 10/04/2019   ALT 17 07/20/2018   AST 15 07/20/2018   NA 137 10/04/2019   K 3.8 10/04/2019   CL 101 10/04/2019   CREATININE 0.93 10/04/2019   BUN 12 10/04/2019   CO2 27 10/04/2019   TSH 0.43 07/20/2018   INR 0.99 12/25/2011   HGBA1C 6.1 10/04/2019    Assessment/Plan:  Osteoarthritis of spine with radiculopathy, lumbosacral region -pt encouraged to keep appt for imaging -f/u with Ortho/pain management, Dr. Beverely Pace advised. -encouraged to use NSAIDs sparingly and take with food. -discussed nonopioid pain management options -ibuprofen 800 mg refilled.  Essential hypertension -mildly elevated possibly 2/2 pain -continue current meds:  Toprol XL 50 mg daily -discussed lifestyle   F/u prn  Grier Mitts, MD

## 2020-01-11 ENCOUNTER — Encounter: Payer: Self-pay | Admitting: Family Medicine

## 2020-01-15 ENCOUNTER — Other Ambulatory Visit: Payer: Self-pay

## 2020-01-15 ENCOUNTER — Ambulatory Visit
Admission: RE | Admit: 2020-01-15 | Discharge: 2020-01-15 | Disposition: A | Discharge status: Home / Self Care | Payer: Medicare HMO | Source: Ambulatory Visit | Attending: Orthopedic Surgery | Admitting: Orthopedic Surgery

## 2020-01-15 ENCOUNTER — Telehealth: Payer: Self-pay | Admitting: Family Medicine

## 2020-01-15 DIAGNOSIS — M5116 Intervertebral disc disorders with radiculopathy, lumbar region: Secondary | ICD-10-CM | POA: Diagnosis not present

## 2020-01-15 DIAGNOSIS — M4726 Other spondylosis with radiculopathy, lumbar region: Secondary | ICD-10-CM | POA: Diagnosis not present

## 2020-01-15 DIAGNOSIS — M5117 Intervertebral disc disorders with radiculopathy, lumbosacral region: Secondary | ICD-10-CM | POA: Diagnosis not present

## 2020-01-15 DIAGNOSIS — M4317 Spondylolisthesis, lumbosacral region: Secondary | ICD-10-CM | POA: Diagnosis not present

## 2020-01-15 DIAGNOSIS — M545 Low back pain, unspecified: Secondary | ICD-10-CM

## 2020-01-15 NOTE — Telephone Encounter (Signed)
Left message for patient to schedule Annual Wellness Visit.  Please schedule with Nurse Health Advisor Shannon Crews, RN at [facility]  

## 2020-01-18 DIAGNOSIS — M5416 Radiculopathy, lumbar region: Secondary | ICD-10-CM | POA: Diagnosis not present

## 2020-01-18 DIAGNOSIS — M545 Low back pain: Secondary | ICD-10-CM | POA: Diagnosis not present

## 2020-01-19 DIAGNOSIS — Z1231 Encounter for screening mammogram for malignant neoplasm of breast: Secondary | ICD-10-CM | POA: Diagnosis not present

## 2020-01-19 DIAGNOSIS — R69 Illness, unspecified: Secondary | ICD-10-CM | POA: Diagnosis not present

## 2020-01-23 ENCOUNTER — Encounter: Payer: Self-pay | Admitting: Neurology

## 2020-01-23 ENCOUNTER — Other Ambulatory Visit: Payer: Self-pay

## 2020-01-23 ENCOUNTER — Ambulatory Visit: Payer: Medicare HMO | Admitting: Neurology

## 2020-01-23 VITALS — BP 165/96 | HR 85 | Ht 69.0 in | Wt 275.2 lb

## 2020-01-23 DIAGNOSIS — R030 Elevated blood-pressure reading, without diagnosis of hypertension: Secondary | ICD-10-CM

## 2020-01-23 DIAGNOSIS — R9082 White matter disease, unspecified: Secondary | ICD-10-CM | POA: Diagnosis not present

## 2020-01-23 DIAGNOSIS — G43009 Migraine without aura, not intractable, without status migrainosus: Secondary | ICD-10-CM | POA: Diagnosis not present

## 2020-01-23 DIAGNOSIS — H539 Unspecified visual disturbance: Secondary | ICD-10-CM

## 2020-01-23 DIAGNOSIS — G3184 Mild cognitive impairment, so stated: Secondary | ICD-10-CM

## 2020-01-23 DIAGNOSIS — Z8669 Personal history of other diseases of the nervous system and sense organs: Secondary | ICD-10-CM

## 2020-01-23 NOTE — Patient Instructions (Signed)
1.  For formal eye exam, I will refer you to Dr. Marshall Cork at [facility] 2.  We will repeat neurocognitive testing in one year with Dr. Melvyn Novas.  Follow up with me afterwards.

## 2020-01-23 NOTE — Progress Notes (Signed)
Referral info faxed to [facility] [phone] ~
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
## 2020-01-26 DIAGNOSIS — M545 Low back pain: Secondary | ICD-10-CM | POA: Diagnosis not present

## 2020-01-26 DIAGNOSIS — M5416 Radiculopathy, lumbar region: Secondary | ICD-10-CM | POA: Diagnosis not present

## 2020-02-01 DIAGNOSIS — G894 Chronic pain syndrome: Secondary | ICD-10-CM | POA: Diagnosis not present

## 2020-02-01 DIAGNOSIS — M5441 Lumbago with sciatica, right side: Secondary | ICD-10-CM | POA: Diagnosis not present

## 2020-02-01 DIAGNOSIS — M533 Sacrococcygeal disorders, not elsewhere classified: Secondary | ICD-10-CM | POA: Diagnosis not present

## 2020-02-01 DIAGNOSIS — M7918 Myalgia, other site: Secondary | ICD-10-CM | POA: Diagnosis not present

## 2020-02-13 DIAGNOSIS — M545 Low back pain: Secondary | ICD-10-CM | POA: Diagnosis not present

## 2020-02-13 DIAGNOSIS — M5416 Radiculopathy, lumbar region: Secondary | ICD-10-CM | POA: Diagnosis not present

## 2020-02-20 ENCOUNTER — Telehealth: Payer: Self-pay | Admitting: Family Medicine

## 2020-02-20 NOTE — Telephone Encounter (Signed)
Left message for patient to schedule Annual Wellness Visit.  Please schedule with Nurse Health Advisor Shannon Crews, RN at [facility]  

## 2020-02-22 ENCOUNTER — Telehealth: Payer: Self-pay | Admitting: Neurology

## 2020-02-22 NOTE — Telephone Encounter (Signed)
Telephone call to pt, pt referral sent 7/20 to [facility].

## 2020-02-22 NOTE — Telephone Encounter (Signed)
Patient called in needing to find out which eye doctor he wanted her to make an appointment with.

## 2020-02-27 DIAGNOSIS — M5416 Radiculopathy, lumbar region: Secondary | ICD-10-CM | POA: Diagnosis not present

## 2020-03-01 DIAGNOSIS — R69 Illness, unspecified: Secondary | ICD-10-CM | POA: Diagnosis not present

## 2020-03-01 DIAGNOSIS — F209 Schizophrenia, unspecified: Secondary | ICD-10-CM | POA: Diagnosis not present

## 2020-03-13 ENCOUNTER — Encounter: Payer: Self-pay | Admitting: Family Medicine

## 2020-03-13 DIAGNOSIS — H2513 Age-related nuclear cataract, bilateral: Secondary | ICD-10-CM | POA: Diagnosis not present

## 2020-03-13 DIAGNOSIS — R519 Headache, unspecified: Secondary | ICD-10-CM | POA: Diagnosis not present

## 2020-03-13 DIAGNOSIS — H25013 Cortical age-related cataract, bilateral: Secondary | ICD-10-CM | POA: Diagnosis not present

## 2020-03-14 ENCOUNTER — Other Ambulatory Visit: Payer: Self-pay | Admitting: Family Medicine

## 2020-03-14 DIAGNOSIS — M4727 Other spondylosis with radiculopathy, lumbosacral region: Secondary | ICD-10-CM

## 2020-03-19 DIAGNOSIS — M545 Low back pain: Secondary | ICD-10-CM | POA: Diagnosis not present

## 2020-03-19 DIAGNOSIS — M5416 Radiculopathy, lumbar region: Secondary | ICD-10-CM | POA: Diagnosis not present

## 2020-03-26 ENCOUNTER — Ambulatory Visit: Payer: Self-pay | Admitting: Neurology

## 2020-04-01 ENCOUNTER — Other Ambulatory Visit: Payer: Self-pay | Admitting: Neurosurgery

## 2020-04-01 ENCOUNTER — Other Ambulatory Visit (HOSPITAL_COMMUNITY): Payer: Self-pay | Admitting: Neurosurgery

## 2020-04-01 DIAGNOSIS — M5127 Other intervertebral disc displacement, lumbosacral region: Secondary | ICD-10-CM | POA: Diagnosis not present

## 2020-04-04 ENCOUNTER — Ambulatory Visit: Payer: Medicare HMO | Admitting: Family Medicine

## 2020-04-05 DIAGNOSIS — R69 Illness, unspecified: Secondary | ICD-10-CM | POA: Diagnosis not present

## 2020-04-09 ENCOUNTER — Ambulatory Visit: Payer: Medicare HMO

## 2020-04-12 ENCOUNTER — Ambulatory Visit (HOSPITAL_COMMUNITY)
Admission: RE | Admit: 2020-04-12 | Discharge: 2020-04-12 | Disposition: A | Discharge status: Home / Self Care | Payer: Medicare HMO | Source: Ambulatory Visit | Attending: Neurosurgery | Admitting: Neurosurgery

## 2020-04-12 ENCOUNTER — Other Ambulatory Visit: Payer: Self-pay

## 2020-04-12 DIAGNOSIS — M5136 Other intervertebral disc degeneration, lumbar region: Secondary | ICD-10-CM | POA: Diagnosis not present

## 2020-04-12 DIAGNOSIS — M5416 Radiculopathy, lumbar region: Secondary | ICD-10-CM | POA: Diagnosis not present

## 2020-04-12 DIAGNOSIS — M5127 Other intervertebral disc displacement, lumbosacral region: Secondary | ICD-10-CM

## 2020-04-12 DIAGNOSIS — M4316 Spondylolisthesis, lumbar region: Secondary | ICD-10-CM | POA: Diagnosis not present

## 2020-04-12 DIAGNOSIS — M545 Low back pain, unspecified: Secondary | ICD-10-CM | POA: Diagnosis not present

## 2020-04-12 MED ORDER — OXYCODONE HCL 5 MG PO TABS
5.0000 mg | ORAL_TABLET | ORAL | Status: DC | PRN
  Administered 2020-04-12: 5 mg via ORAL
  Filled 2020-04-12 (×2): qty 2
  Filled 2020-04-12: qty 1

## 2020-04-12 MED ORDER — IOHEXOL 180 MG/ML  SOLN
20.0000 mL | Freq: Once | INTRAMUSCULAR | Status: AC | PRN
  Administered 2020-04-12: 20 mL via INTRATHECAL

## 2020-04-12 MED ORDER — LIDOCAINE HCL (PF) 1 % IJ SOLN
5.0000 mL | Freq: Once | INTRAMUSCULAR | Status: AC
  Administered 2020-04-12: 5 mL via INTRADERMAL

## 2020-04-12 MED ORDER — DIAZEPAM 5 MG PO TABS
10.0000 mg | ORAL_TABLET | Freq: Once | ORAL | Status: AC
  Administered 2020-04-12: 10 mg via ORAL
  Filled 2020-04-12: qty 2

## 2020-04-12 MED ORDER — ONDANSETRON HCL 4 MG/2ML IJ SOLN: 4.0000 mg | Freq: Four times a day (QID) | INTRAMUSCULAR | Status: DC | PRN

## 2020-04-12 MED ORDER — DIAZEPAM 5 MG PO TABS
ORAL_TABLET | ORAL | Status: AC
  Filled 2020-04-12: qty 2

## 2020-04-12 NOTE — Op Note (Signed)
*   No surgery found * lumbar Myelogram  PATIENT:  [name] is a 59 y.o. female with radicular pain in the right lower extremity  PRE-OPERATIVE DIAGNOSIS:  Lumbar radiculopathy  POST-OPERATIVE DIAGNOSIS:  same  PROCEDURE:  Lumbar Myelogram  SURGEON:  Dorethea Strubel  ANESTHESIA:   local LOCAL MEDICATIONS USED:  LIDOCAINE  Procedure Note: Gerald Honea is a 59 y.o. female Was taken to the fluoroscopy suite and  positioned prone on the fluoroscopy table. her back was prepared and draped in a sterile manner. I infiltrated 10 cc into the lumbar region. I then introduced a spinal needle into the thecal sac at the L3/4 interlaminar space. I infiltrated 20cc of Isovue 180 into the thecal sac. Fluoroscopy showed the needle and contrast in the thecal sac. [name] tolerated the procedure well. she Will be taken to CT for evaluation.     PATIENT DISPOSITION:  Short Stay

## 2020-04-12 NOTE — Progress Notes (Signed)
Patient was given discharge instructions. She verbalized understanding. 

## 2020-04-12 NOTE — Discharge Instructions (Signed)
Myelogram  A myelogram is an imaging test. This test checks for problems in the spinal cord and the places where nerves attach to the spinal cord (nerve roots). A dye (contrast material) is put into your spine before the X-ray. This provides a clearer image for your doctor to see. You may need this test if you have a spinal cord problem that cannot be diagnosed with other imaging tests. You may also have this test to check your spine after surgery. Tell a doctor about:  Any allergies you have, especially to iodine.  All medicines you are taking, including vitamins, herbs, eye drops, creams, and over-the-counter medicines.  Any problems you or family members have had with anesthetic medicines or dye.  Any blood disorders you have.  Any surgeries you have had.  Any medical conditions you have or have had, including asthma.  Whether you are pregnant or may be pregnant. What are the risks? Generally, this is a safe procedure. However, problems may occur, including:  Infection.  Bleeding.  Allergic reaction to medicines or dyes.  Damage to your spinal cord or nerves.  Leaking of spinal fluid. This can cause a headache.  Damage to kidneys.  Seizures. This is rare. What happens before the procedure?  Follow instructions from your doctor about what you cannot eat or drink. You may be asked to drink more fluids.  Ask your doctor about changing or stopping your normal medicines. This is important if you take diabetes medicines or blood thinners.  Plan to have someone take you home from the hospital or clinic.  If you will be going home right after the procedure, plan to have someone with you for 24 hours. What happens during the procedure?  You will lie face down on a table.  Your doctor will find the best injection site on your spine. This is most often in the lower back.  This area will be washed with soap.  You will be given a medicine to numb the area (local  anesthetic).  Your doctor will place a long needle into the space around your spinal cord.  A sample of spinal fluid may be taken. This may be sent to the lab for testing.  The dye will be injected into the space around your spinal cord.  The exam table may be tilted. This helps the dye flow up or down your spine.  The X-ray will take images of your spinal cord.  A bandage (dressing) may be placed over the area where the dye was injected. The procedure may vary among doctors and hospitals. What can I expect after the procedure?  You may be monitored until you leave the hospital or clinic. This includes checking your blood pressure, heart rate, breathing rate, and blood oxygen level.  You may feel sore at the injection site. You may have a mild headache.  You will be told to lie flat with your head raised (elevated). This lowers the risk of a headache.  It is up to you to get the results of your procedure. Ask your doctor, or the department that is doing the procedure, when your results will be ready. Follow these instructions at home:   Rest as told by your doctor. Lie flat with your head slightly elevated.  Do not bend, lift, or do hard work for 24-48 hours, or as told by your doctor.  Take over-the-counter and prescription medicines only as told by your doctor.  Take care of your bandage as told by your   doctor.  Drink enough fluid to keep your pee (urine) pale yellow.  Bathe or shower as told by your doctor. Contact a doctor if:  You have a fever.  You have a headache that lasts longer than 24 hours.  You feel sick to your stomach (nauseous).  You vomit.  Your neck is stiff.  Your legs feel numb.  You cannot pee.  You cannot poop (no bowel movement).  You have a rash.  You are itchy or sneezing. Get help right away if:  You have new symptoms or your symptoms get worse.  You have a seizure.  You have trouble breathing. Summary  A myelogram is an  imaging test that checks for problems in the spinal cord and the places where nerves attach to the spinal cord (nerve roots).  Before the procedure, follow instructions from your doctor. You will be told what not to eat or drink, or what medicines to change or stop.  After the procedure, you will be told to lie flat with your head raised (elevated). This will lower your risk of a headache.  Do not bend, lift, or do any hard work for 24-48 hours, or as told by your doctor.  Contact a doctor if you have a stiff neck or numb legs. Get help right away if your symptoms get worse, or you have a seizure or trouble breathing. This information is not intended to replace advice given to you by your health care provider. Make sure you discuss any questions you have with your health care provider. Document Revised: 08/31/2018 Document Reviewed: 09/01/2018 Elsevier Patient Education  2020 [employer]  

## 2020-04-15 DIAGNOSIS — G8929 Other chronic pain: Secondary | ICD-10-CM | POA: Diagnosis not present

## 2020-04-15 DIAGNOSIS — M25561 Pain in right knee: Secondary | ICD-10-CM | POA: Diagnosis not present

## 2020-04-17 DIAGNOSIS — M25561 Pain in right knee: Secondary | ICD-10-CM | POA: Diagnosis not present

## 2020-04-24 ENCOUNTER — Ambulatory Visit (INDEPENDENT_AMBULATORY_CARE_PROVIDER_SITE_OTHER): Payer: Medicare HMO | Admitting: Family Medicine

## 2020-04-24 ENCOUNTER — Encounter: Payer: Self-pay | Admitting: Family Medicine

## 2020-04-24 ENCOUNTER — Other Ambulatory Visit: Payer: Self-pay

## 2020-04-24 VITALS — BP 120/76 | HR 66 | Temp 99.2°F | Wt 281.4 lb

## 2020-04-24 DIAGNOSIS — M8949 Other hypertrophic osteoarthropathy, multiple sites: Secondary | ICD-10-CM | POA: Diagnosis not present

## 2020-04-24 DIAGNOSIS — Z23 Encounter for immunization: Secondary | ICD-10-CM | POA: Diagnosis not present

## 2020-04-24 DIAGNOSIS — I10 Essential (primary) hypertension: Secondary | ICD-10-CM | POA: Diagnosis not present

## 2020-04-24 DIAGNOSIS — E559 Vitamin D deficiency, unspecified: Secondary | ICD-10-CM | POA: Diagnosis not present

## 2020-04-24 DIAGNOSIS — R635 Abnormal weight gain: Secondary | ICD-10-CM

## 2020-04-24 DIAGNOSIS — M159 Polyosteoarthritis, unspecified: Secondary | ICD-10-CM

## 2020-04-24 NOTE — Progress Notes (Signed)
Subjective:    Patient ID: [name], female    DOB: [birth year], 59 y.o.   MRN: [id number]  No chief complaint on file.   HPI Patient was seen today for follow-up.  Pt states she needs TKR of right knee however has to lose 40 pounds before surgery can be done.  Pt states it is difficult to 2/2 OA in spine and knee pain.  Patient notes difficulty moving around her home as her bedroom is upstairs.  Pt endorses eating healthy, baking foods and eating vegetables.  Pt drinking 3 sodas per day, sweet tea, and water.  Pt was given information about weight management clinic.  States she contacted clinic and is awaiting appointment.  In the past patient was doing water aerobics.  Waiting for your approval with her insurance company.  Patient endorses weight gain since last OFV.  BP controlled on metoprolol succinate 50 mg.  Patient followed by [facility] stable on current meds.  Patient inquires about influenza vaccine.  Past Medical History:  Diagnosis Date  . Anemia   . Arthritis   . Arthropathy, unspecified, site unspecified   . Asthma   . Backache, unspecified   . Bipolar I disorder 06/13/2008   Qualifier: Diagnosis of  By: Burt Knack CMA, Cecille Rubin    . Dysphagia, pharyngoesophageal phase 09/12/2008   Qualifier: Diagnosis of  By: Joya Gaskins MD, Burnett Harry   . Fibromyalgia   . Generalized anxiety disorder 04/05/2012  . GERD (gastroesophageal reflux disease) 07/05/2008   Qualifier: Diagnosis of  By: Joya Gaskins MD, Burnett Harry   . Heart disease 12/19/2018  . Hypertension   . Intractable migraine with aura 11/03/2013  . Lumbar radiculopathy 08/30/2012  . Major depressive disorder 09/08/2019  . Major depressive disorder, recurrent, severe with psychotic features 04/05/2012  . Mild neurocognitive disorder, unclear etiology 09/08/2019  . Multiple sclerosis 11/03/2013   remission at present(past hx. bedridden) -ambulates with cane-left side weaker  . Obstructive sleep apnea 06/13/2008   no cpap-uses Oxygen nightly 2l/m    . On home oxygen therapy    nightly at 2l/m  . Other abnormal glucose    05-19-13 no problems now  . Seizure disorder    last seizure 2014  . Thyroid disease 10/03/2013    Allergies  Allergen Reactions  . Bee Venom Anaphylaxis  . Depakote [Divalproex Sodium] Shortness Of Breath and Rash  . Haloperidol Lactate Swelling  . Hornet Venom Anaphylaxis  . Latex Anaphylaxis    mild rash/itching, wheezing/sob  . Peanut-Containing Drug Products Anaphylaxis  . Penicillins Anaphylaxis and Swelling  . Quetiapine Anaphylaxis and Other (See Comments)  . Shellfish-Derived Products Anaphylaxis  . Valproic Acid Swelling  . Amitriptyline Hcl Other (See Comments)    HALLUCINATIONS  . Butorphanol Tartrate Other (See Comments)    Hallucinations  . Gabapentin Other (See Comments)    Pt reports severe back and side pain  . Iodine     Flushing and fainting  . Pregabalin Rash  . Terbutaline Rash    ROS General: Denies fever, chills, night sweats, changes in appetite  + weight gain HEENT: Denies headaches, ear pain, changes in vision, rhinorrhea, sore throat CV: Denies CP, palpitations, SOB, orthopnea Pulm: Denies SOB, cough, wheezing GI: Denies abdominal pain, nausea, vomiting, diarrhea, constipation GU: Denies dysuria, hematuria, frequency, vaginal discharge Msk: Denies muscle cramps, joint pains  + low back pain, right knee pain Neuro: Denies weakness, numbness, tingling Skin: Denies rashes, bruising Psych: Denies depression, anxiety, hallucinations    Objective:  Blood pressure 120/76, pulse 66, temperature 99.2 F (37.3 C), temperature source Oral, weight 281 lb 6.4 oz (127.6 kg), SpO2 95 %.  Gen. Pleasant, well-nourished, obesity, in no distress, normal affect   HEENT: [place]/AT, face symmetric, conjunctiva clear, no scleral icterus, PERRLA, EOMI, nares patent without drainage Lungs: no accessory muscle use, CTAB, no wheezes or rales Cardiovascular: RRR, no peripheral  edema Musculoskeletal: No deformities, no cyanosis or clubbing, normal tone Neuro:  A&Ox3, CN II-XII intact, normal gait Skin:  Warm, no lesions/ rash   Wt Readings from Last 3 Encounters:  04/24/20 281 lb 6.4 oz (127.6 kg)  04/12/20 276 lb (125.2 kg)  01/23/20 275 lb 3.2 oz (124.8 kg)    Lab Results  Component Value Date   WBC 5.4 10/04/2019   HGB 13.1 10/04/2019   HCT 40.4 10/04/2019   PLT 314.0 10/04/2019   GLUCOSE 93 10/04/2019   CHOL 230 (H) 10/04/2019   TRIG 73.0 10/04/2019   HDL 62.80 10/04/2019   LDLCALC 152 (H) 10/04/2019   ALT 17 07/20/2018   AST 15 07/20/2018   NA 137 10/04/2019   K 3.8 10/04/2019   CL 101 10/04/2019   CREATININE 0.93 10/04/2019   BUN 12 10/04/2019   CO2 27 10/04/2019   TSH 0.43 07/20/2018   INR 0.99 12/25/2011   HGBA1C 6.1 10/04/2019    Assessment/Plan:  Weight gain -Discussed possible causes including current medications, thyroid dysfunction, recent intake of sugar -6 pound weight gain since January 23, 2020 -Current weight 281.4 pounds -Discussed portion sizes, decreasing intake of sugar and carbs and increasing physical activity -Patient awaiting appointment with weight management clinic -Given information about chair exercises.  Patient also to look into restarting water aerobics. - Plan: TSH, T4, free, Lipid panel, Hemoglobin A1c  Primary osteoarthritis involving multiple joints -Discussed increasing physical activity with chair exercises and restarting water aerobics -Continue supportive care -Form for surgery optimization received from Ortho office however will wait to complete once patient loses weight. -Continue follow-up with [facility]: CBC with Differential/Platelet  Need for immunization against influenza  - Plan: Flu Vaccine QUAD 6+ mos PF IM (Fluarix Quad PF)  Vitamin D deficiency -Vitamin D 8.96 on 10/04/2019. -Patient completed ergocalciferol 50,000 IU.  We will recheck vitamin D level - Plan:  Vitamin D, 25-hydroxy  Essential hypertension -Controlled -Continue Toprol-XL 50 mg daily -Continue lifestyle modifications - Plan: BASIC METABOLIC PANEL WITH GFR  F/u in 1 month, sooner if needed  Grier Mitts, MD

## 2020-04-24 NOTE — Patient Instructions (Signed)
Preventing Unhealthy Weight Gain, Adult Staying at a healthy weight is important to your overall health. When fat builds up in your body, you may become overweight or obese. Being overweight or obese increases your risk of developing certain health problems, such as heart disease, diabetes, sleeping problems, joint problems, and some types of cancer. Unhealthy weight gain is often the result of making unhealthy food choices or not getting enough exercise. You can make changes to your lifestyle to prevent obesity and stay as healthy as possible. What nutrition changes can be made?   Eat only as much as your body needs. To do this: ? Pay attention to signs that you are hungry or full. Stop eating as soon as you feel full. ? If you feel hungry, try drinking water first before eating. Drink enough water so your urine is clear or pale yellow. ? Eat smaller portions. Pay attention to portion sizes when eating out. ? Look at serving sizes on food labels. Most foods contain more than one serving per container. ? Eat the recommended number of calories for your gender and activity level. For most active people, a daily total of 2,000 calories is appropriate. If you are trying to lose weight or are not very active, you may need to eat fewer calories. Talk with your health care provider or a diet and nutrition specialist (dietitian) about how many calories you need each day.  Choose healthy foods, such as: ? Fruits and vegetables. At each meal, try to fill at least half of your plate with fruits and vegetables. ? Whole grains, such as whole-wheat bread, brown rice, and quinoa. ? Lean meats, such as chicken or fish. ? Other healthy proteins, such as beans, eggs, or tofu. ? Healthy fats, such as nuts, seeds, fatty fish, and olive oil. ? Low-fat or fat-free dairy products.  Check food labels, and avoid food and drinks that: ? Are high in calories. ? Have added sugar. ? Are high in sodium. ? Have saturated  fats or trans fats.  Cook foods in healthier ways, such as by baking, broiling, or grilling.  Make a meal plan for the week, and shop with a grocery list to help you stay on track with your purchases. Try to avoid going to the grocery store when you are hungry.  When grocery shopping, try to shop around the outside of the store first, where the fresh foods are. Doing this helps you to avoid prepackaged foods, which can be high in sugar, salt (sodium), and fat. What lifestyle changes can be made?   Exercise for 30 or more minutes on 5 or more days each week. Exercising may include brisk walking, yard work, biking, running, swimming, and team sports like basketball and soccer. Ask your health care provider which exercises are safe for you.  Do muscle-strengthening activities, such as lifting weights or using resistance bands, on 2 or more days a week.  Do not use any products that contain nicotine or tobacco, such as cigarettes and e-cigarettes. If you need help quitting, ask your health care provider.  Limit alcohol intake to no more than 1 drink a day for nonpregnant women and 2 drinks a day for men. One drink equals 12 oz of beer, 5 oz of wine, or 1 oz of hard liquor.  Try to get 7-9 hours of sleep each night. What other changes can be made?  Keep a food and activity journal to keep track of: ? What you ate and how many calories  you had. Remember to count the calories in sauces, dressings, and side dishes. ? Whether you were active, and what exercises you did. ? Your calorie, weight, and activity goals.  Check your weight regularly. Track any changes. If you notice you have gained weight, make changes to your diet or activity routine.  Avoid taking weight-loss medicines or supplements. Talk to your health care provider before starting any new medicine or supplement.  Talk to your health care provider before trying any new diet or exercise plan. Why are these changes  important? Eating healthy, staying active, and having healthy habits can help you to prevent obesity. Those changes also:  Help you manage stress and emotions.  Help you connect with friends and family.  Improve your self-esteem.  Improve your sleep.  Prevent long-term health problems. What can happen if changes are not made? Being obese or overweight can cause you to develop joint or bone problems, which can make it hard for you to stay active or do activities you enjoy. Being obese or overweight also puts stress on your heart and lungs and can lead to health problems like diabetes, heart disease, and some cancers. Where to find more information Talk with your health care provider or a dietitian about healthy eating and healthy lifestyle choices. You may also find information from:  [place]. Department of Agriculture, MyPlate: FormerBoss.no  American Heart Association: www.heart.org  Centers for Disease Control and Prevention: http://www.wolf.info/ Summary  Staying at a healthy weight is important to your overall health. It helps you to prevent certain diseases and health problems, such as heart disease, diabetes, joint problems, sleep disorders, and some types of cancer.  Being obese or overweight can cause you to develop joint or bone problems, which can make it hard for you to stay active or do activities you enjoy.  You can prevent unhealthy weight gain by eating a healthy diet, exercising regularly, not smoking, limiting alcohol, and getting enough sleep.  Talk with your health care provider or a dietitian for guidance about healthy eating and healthy lifestyle choices. This information is not intended to replace advice given to you by your health care provider. Make sure you discuss any questions you have with your health care provider. Document Revised: 06/25/2017 Document Reviewed: 07/29/2016 Elsevier Patient Education  [place].  Exercises To Do While  Sitting  Exercises that you do while sitting (chair exercises) can give you many of the same benefits as full exercise. Benefits include strengthening your heart, burning calories, and keeping muscles and joints healthy. Exercise can also improve your mood and help with depression and anxiety. You may benefit from chair exercises if you are unable to do standing exercises because of:  Diabetic foot pain.  Obesity.  Illness.  Arthritis.  Recovery from surgery or injury.  Breathing problems.  Balance problems.  Another type of disability. Before starting chair exercises, check with your health care provider or a physical therapist to find out how much exercise you can tolerate and which exercises are safe for you. If your health care provider approves:  Start out slowly and build up over time. Aim to work up to about 10-20 minutes for each exercise session.  Make exercise part of your daily routine.  Drink water when you exercise. Do not wait until you are thirsty. Drink every 10-15 minutes.  Stop exercising right away if you have pain, nausea, shortness of breath, or dizziness.  If you are exercising in a wheelchair, make sure to lock  the wheels.  Ask your health care provider whether you can do tai chi or yoga. Many positions in these mind-body exercises can be modified to do while seated. Warm-up Before starting other exercises: 1. Sit up as straight as you can. Have your knees bent at 90 degrees, which is the shape of the capital letter "L." Keep your feet flat on the floor. 2. Sit at the front edge of your chair, if you can. 3. Pull in (tighten) the muscles in your abdomen and stretch your spine and neck as straight as you can. Hold this position for a few minutes. 4. Breathe in and out evenly. Try to concentrate on your breathing, and relax your mind. Stretching Exercise A: Arm stretch 1. Hold your arms out straight in front of your body. 2. Bend your hands at the wrist  with your fingers pointing up, as if signaling someone to stop. Notice the slight tension in your forearms as you hold the position. 3. Keeping your arms out and your hands bent, rotate your hands outward as far as you can and hold this stretch. Aim to have your thumbs pointing up and your pinkie fingers pointing down. Slowly repeat arm stretches for one minute as tolerated. Exercise B: Leg stretch 1. If you can move your legs, try to "draw" letters on the floor with the toes of your foot. Write your name with one foot. 2. Write your name with the toes of your other foot. Slowly repeat the movements for one minute as tolerated. Exercise C: Reach for the sky 1. Reach your hands as far over your head as you can to stretch your spine. 2. Move your hands and arms as if you are climbing a rope. Slowly repeat the movements for one minute as tolerated. Range of motion exercises Exercise A: Shoulder roll 1. Let your arms hang loosely at your sides. 2. Lift just your shoulders up toward your ears, then let them relax back down. 3. When your shoulders feel loose, rotate your shoulders in backward and forward circles. Do shoulder rolls slowly for one minute as tolerated. Exercise B: March in place 1. As if you are marching, pump your arms and lift your legs up and down. Lift your knees as high as you can. ? If you are unable to lift your knees, just pump your arms and move your ankles and feet up and down. March in place for one minute as tolerated. Exercise C: Seated jumping jacks 1. Let your arms hang down straight. 2. Keeping your arms straight, lift them up over your head. Aim to point your fingers to the ceiling. 3. While you lift your arms, straighten your legs and slide your heels along the floor to your sides, as wide as you can. 4. As you bring your arms back down to your sides, slide your legs back together. ? If you are unable to use your legs, just move your arms. Slowly repeat seated  jumping jacks for one minute as tolerated. Strengthening exercises Exercise A: Shoulder squeeze 1. Hold your arms straight out from your body to your sides, with your elbows bent and your fists pointed at the ceiling. 2. Keeping your arms in the bent position, move them forward so your elbows and forearms meet in front of your face. 3. Open your arms back out as wide as you can with your elbows still bent, until you feel your shoulder blades squeezing together. Hold for 5 seconds. Slowly repeat the movements forward and backward  for one minute as tolerated. Contact a health care provider if you:  Had to stop exercising due to any of the following: ? Pain. ? Nausea. ? Shortness of breath. ? Dizziness. ? Fatigue.  Have significant pain or soreness after exercising. Get help right away if you have:  Chest pain.  Difficulty breathing. These symptoms may represent a serious problem that is an emergency. Do not wait to see if the symptoms will go away. Get medical help right away. Call your local emergency services (911 in the [place] Do not drive yourself to the hospital. This information is not intended to replace advice given to you by your health care provider. Make sure you discuss any questions you have with your health care provider. Document Revised: 10/13/2018 Document Reviewed: 05/05/2017 Elsevier Patient Education  2020 [place].  Vitamin D Deficiency Vitamin D deficiency is when your body does not have enough vitamin D. Vitamin D is important to your body because:  It helps your body use other minerals.  It helps to keep your bones strong and healthy.  It may help to prevent some diseases.  It helps your heart and other muscles work well. Not getting enough vitamin D can make your bones soft. It can also cause other health problems. What are the causes? This condition may be caused by:  Not eating enough foods that contain vitamin D.  Not getting enough  sun.  Having diseases that make it hard for your body to absorb vitamin D.  Having a surgery in which a part of the stomach or a part of the small intestine is removed.  Having kidney disease or liver disease. What increases the risk? You are more likely to get this condition if:  You are older.  You do not spend much time outdoors.  You live in a nursing home.  You have had broken bones.  You have weak or thin bones (osteoporosis).  You have a disease or condition that changes how your body absorbs vitamin D.  You have dark skin.  You take certain medicines.  You are overweight or obese. What are the signs or symptoms?  In mild cases, there may not be any symptoms. If the condition is very bad, symptoms may include: ? Bone pain. ? Muscle pain. ? Falling often. ? Broken bones caused by a minor injury. How is this treated? Treatment may include taking supplements as told by your doctor. Your doctor will tell you what dose is best for you. Supplements may include:  Vitamin D.  Calcium. Follow these instructions at home: Eating and drinking   Eat foods that contain vitamin D, such as: ? Dairy products, cereals, or juices with added vitamin D. Check the label. ? Fish, such as salmon or trout. ? Eggs. ? Oysters. ? Mushrooms. The items listed above may not be a complete list of what you can eat and drink. Contact a dietitian for more options. General instructions  Take medicines and supplements only as told by your doctor.  Get regular, safe exposure to natural sunlight.  Do not use a tanning bed.  Maintain a healthy weight. Lose weight if needed.  Keep all follow-up visits as told by your doctor. This is important. How is this prevented?  You can get vitamin D by: ? Eating foods that naturally contain vitamin D. ? Eating or drinking products that have vitamin D added to them, such as cereals, juices, and milk. ? Taking vitamin D or a multivitamin that  contains vitamin D. ? Being in the sun. Your body makes vitamin D when your skin is exposed to sunlight. Your body changes the sunlight into a form of the vitamin that it can use. Contact a doctor if:  Your symptoms do not go away.  You feel sick to your stomach (nauseous).  You throw up (vomit).  You poop less often than normal, or you have trouble pooping (constipation). Summary  Vitamin D deficiency is when your body does not have enough vitamin D.  Vitamin D helps to keep your bones strong and healthy.  This condition is often treated by taking a supplement.  Your doctor will tell you what dose is best for you. This information is not intended to replace advice given to you by your health care provider. Make sure you discuss any questions you have with your health care provider. Document Revised: 02/28/2018 Document Reviewed: 02/28/2018 Elsevier Patient Education  [place].

## 2020-04-25 LAB — LIPID PANEL
Cholesterol: 262 mg/dL — ABNORMAL HIGH (ref <200)
HDL: 71 mg/dL (ref >=50)
LDL Cholesterol (Calc): 170 mg/dL (calc) — ABNORMAL HIGH
Non-HDL Cholesterol (Calc): 191 mg/dL (calc) — ABNORMAL HIGH (ref <130)
Total CHOL/HDL Ratio: 3.7 (calc) (ref <5.0)
Triglycerides: 91 mg/dL (ref <150)

## 2020-04-25 LAB — BASIC METABOLIC PANEL WITH GFR
BUN/Creatinine Ratio: 10 (calc) (ref 6–22)
BUN: 11 mg/dL (ref 7–25)
CO2: 29 mmol/L (ref 20–32)
Calcium: 9.2 mg/dL (ref 8.6–10.4)
Chloride: 104 mmol/L (ref 98–110)
Creat: 1.1 mg/dL — ABNORMAL HIGH (ref 0.50–1.05)
GFR, Est African American: 64 mL/min/{1.73_m2} (ref >=60)
GFR, Est Non African American: 55 mL/min/{1.73_m2} — ABNORMAL LOW (ref >=60)
Glucose, Bld: 88 mg/dL (ref 65–99)
Potassium: 3.9 mmol/L (ref 3.5–5.3)
Sodium: 140 mmol/L (ref 135–146)

## 2020-04-25 LAB — CBC WITH DIFFERENTIAL/PLATELET
Absolute Monocytes: 437 cells/uL (ref 200–950)
Basophils Absolute: 60 cells/uL (ref 0–200)
Basophils Relative: 1.3 %
Eosinophils Absolute: 202 cells/uL (ref 15–500)
Eosinophils Relative: 4.4 %
HCT: 42.8 % (ref 35.0–45.0)
Hemoglobin: 13.4 g/dL (ref 11.7–15.5)
Lymphs Abs: 2295 cells/uL (ref 850–3900)
MCH: 26 pg — ABNORMAL LOW (ref 27.0–33.0)
MCHC: 31.3 g/dL — ABNORMAL LOW (ref 32.0–36.0)
MCV: 82.9 fL (ref 80.0–100.0)
MPV: 11.1 fL (ref 7.5–12.5)
Monocytes Relative: 9.5 %
Neutro Abs: 1605 cells/uL (ref 1500–7800)
Neutrophils Relative %: 34.9 %
Platelets: 304 10*3/uL (ref 140–400)
RBC: 5.16 10*6/uL — ABNORMAL HIGH (ref 3.80–5.10)
RDW: 16.1 % — ABNORMAL HIGH (ref 11.0–15.0)
Total Lymphocyte: 49.9 %
WBC: 4.6 10*3/uL (ref 3.8–10.8)

## 2020-04-25 LAB — HEMOGLOBIN A1C
Hgb A1c MFr Bld: 5.9 % of total Hgb — ABNORMAL HIGH (ref <5.7)
Mean Plasma Glucose: 123 (calc)
eAG (mmol/L): 6.8 (calc)

## 2020-04-25 LAB — T4, FREE: Free T4: 1.2 ng/dL (ref 0.8–1.8)

## 2020-04-25 LAB — TSH: TSH: 0.92 mIU/L (ref 0.40–4.50)

## 2020-04-25 LAB — VITAMIN D 25 HYDROXY (VIT D DEFICIENCY, FRACTURES): Vit D, 25-Hydroxy: 19 ng/mL — ABNORMAL LOW (ref 30–100)

## 2020-04-26 ENCOUNTER — Other Ambulatory Visit: Payer: Self-pay | Admitting: Family Medicine

## 2020-04-26 DIAGNOSIS — E559 Vitamin D deficiency, unspecified: Secondary | ICD-10-CM

## 2020-04-26 MED ORDER — VITAMIN D (ERGOCALCIFEROL) 1.25 MG (50000 UNIT) PO CAPS: 50000.0000 [IU] | ORAL_CAPSULE | ORAL | 0 refills | Status: DC

## 2020-04-30 ENCOUNTER — Other Ambulatory Visit: Payer: Self-pay

## 2020-04-30 ENCOUNTER — Encounter: Payer: Self-pay | Admitting: Adult Health

## 2020-04-30 ENCOUNTER — Ambulatory Visit: Payer: Medicare HMO | Admitting: Adult Health

## 2020-04-30 ENCOUNTER — Ambulatory Visit (INDEPENDENT_AMBULATORY_CARE_PROVIDER_SITE_OTHER): Payer: Medicare HMO

## 2020-04-30 VITALS — BP 160/100 | HR 80 | Temp 97.2°F | Ht 69.0 in | Wt 278.4 lb

## 2020-04-30 DIAGNOSIS — J45909 Unspecified asthma, uncomplicated: Secondary | ICD-10-CM | POA: Diagnosis not present

## 2020-04-30 DIAGNOSIS — J4540 Moderate persistent asthma, uncomplicated: Secondary | ICD-10-CM | POA: Diagnosis not present

## 2020-04-30 DIAGNOSIS — I10 Essential (primary) hypertension: Secondary | ICD-10-CM | POA: Diagnosis not present

## 2020-04-30 DIAGNOSIS — J309 Allergic rhinitis, unspecified: Secondary | ICD-10-CM | POA: Diagnosis not present

## 2020-04-30 DIAGNOSIS — J9611 Chronic respiratory failure with hypoxia: Secondary | ICD-10-CM

## 2020-04-30 NOTE — Patient Instructions (Addendum)
Set up for Overnight oximetry test on room air.  Set up for PFT  Chest xray today.  Continue on Advair 1 puff Twice daily  , rinse after use.  Albuterol As needed   Obtain sleep study results-bring to next visit or fax to our office.  Follow up with Dr. Annamaria Boots  In 2-3 weeks for preop clearance.  Please contact office for sooner follow up if symptoms do not improve or worsen or seek emergency care

## 2020-04-30 NOTE — Progress Notes (Signed)
@Patient  ID: [name], female    DOB: Dec 11, 1960, 59 y.o.   MRN: [id number]  Chief Complaint  Patient presents with  . Follow-up    Referring provider: Billie Ruddy, MD  HPI: 59 year old female former smoker followed for asthma and allergic rhinitis Medical history significant for VCD and seizure disorder, Bipolar disorder   TEST/EVENTS :  PFT 07/05/08- FVC 3.51/ 88%, FEV1 2.38/ 78%, R 0.68, 25-75% 1.45/ 44%, TLC 83%, DLCO 68% Allergy Profile 01/18/2013-total IgE 242.4 with broad elevations for common inhalant allergens.  04/30/2020 Follow up: Asthma , [place]  Patient presents for a 1 year follow-up.  Patient has underlying asthma and allergic rhinitis. Patient says overall breathing has been doing okay.  No flare of cough or wheezing. Patient was previously on oxygen at bedtime but has not been on this for a while.  Says she did have a sleep study in the past and told that she did not have sleep apnea. Patient says she is going to need a right knee replacement.  Needs preop clearance for her asthma.  Patient says she is independent.  No recent steroid use or hospitalization.  No increased albuterol use.  She remains on Advair twice daily. Patient says she is not as active due to joint issues.. Patient says her weight continues to slowly go up.  BMI is currently 41.     Allergies  Allergen Reactions  . Bee Venom Anaphylaxis  . Depakote [Divalproex Sodium] Shortness Of Breath and Rash  . Haloperidol Lactate Swelling  . [place]et Venom Anaphylaxis  . Latex Anaphylaxis    mild rash/itching, wheezing/sob  . Peanut-Containing Drug Products Anaphylaxis  . Penicillins Anaphylaxis and Swelling  . Quetiapine Anaphylaxis and Other (See Comments)  . Shellfish-Derived Products Anaphylaxis  . Valproic Acid Swelling  . Amitriptyline Hcl Other (See Comments)    HALLUCINATIONS  . Butorphanol Tartrate Other (See Comments)    Hallucinations  . Gabapentin Other (See Comments)     Pt reports severe back and side pain  . Iodine     Flushing and fainting  . Pregabalin Rash  . Terbutaline Rash    Immunization History  Administered Date(s) Administered  . Influenza Split 04/06/2012, 03/06/2013  . Influenza Whole 04/08/2009, 04/05/2010  . Influenza,inj,Quad PF,6+ Mos 03/22/2013, 09/01/2018, 04/24/2020  . PFIZER SARS-COV-2 Vaccination 09/30/2019, 10/21/2019  . Pneumococcal Polysaccharide-23 04/15/2009, 07/07/2011, 04/06/2012    Past Medical History:  Diagnosis Date  . Anemia   . Arthritis   . Arthropathy, unspecified, site unspecified   . Asthma   . Backache, unspecified   . Bipolar I disorder 06/13/2008   Qualifier: Diagnosis of  By: Burt Knack CMA, Cecille Rubin    . Dysphagia, pharyngoesophageal phase 09/12/2008   Qualifier: Diagnosis of  By: Joya Gaskins MD, Burnett Harry   . Fibromyalgia   . Generalized anxiety disorder 04/05/2012  . GERD (gastroesophageal reflux disease) 07/05/2008   Qualifier: Diagnosis of  By: Joya Gaskins MD, Burnett Harry   . Heart disease 12/19/2018  . Hypertension   . Intractable migraine with aura 11/03/2013  . Lumbar radiculopathy 08/30/2012  . Major depressive disorder 09/08/2019  . Major depressive disorder, recurrent, severe with psychotic features 04/05/2012  . Mild neurocognitive disorder, unclear etiology 09/08/2019  . Multiple sclerosis 11/03/2013   remission at present(past hx. bedridden) -ambulates with cane-left side weaker  . Obstructive sleep apnea 06/13/2008   no cpap-uses Oxygen nightly 2l/m   . On home oxygen therapy    nightly at 2l/m  . Other abnormal glucose  05-19-13 no problems now  . Seizure disorder    last seizure 2014  . Thyroid disease 10/03/2013    Tobacco History: Social History   Tobacco Use  Smoking Status Former Smoker  . Packs/day: 1.00  . Years: 5.00  . Pack years: 5.00  . Quit date: 07/07/2007  . Years since quitting: 12.8  Smokeless Tobacco Never Used  Tobacco Comment   06/18 pt states she was an occasional smoker    Counseling given: Not Answered Comment: 06/18 pt states she was an occasional smoker   Outpatient Medications Prior to Visit  Medication Sig Dispense Refill  . albuterol (PROAIR HFA) 108 (90 Base) MCG/ACT inhaler INHALE 2 PUFFS BY MOUTH EVERY 4 HOURS AS NEEDED FOR WHEEZING OR SHORTNESS OF BREATH (Patient taking differently: Inhale 2 puffs into the lungs every 4 (four) hours as needed for wheezing or shortness of breath. ) 18 g 12  . benztropine (COGENTIN) 0.5 MG tablet Take 0.5 mg by mouth daily.     Marland Kitchen EPINEPHrine (EPIPEN 2-PAK) 0.3 mg/0.3 mL IJ SOAJ injection Inject 0.3 mg into the muscle as needed for anaphylaxis.    . famotidine (PEPCID) 20 MG tablet Take 1 tablet (20 mg total) by mouth 2 (two) times daily. For stomach acid. (Patient taking differently: Take 20 mg by mouth daily. ) 60 tablet 0  . FLUoxetine (PROZAC) 40 MG capsule Take 80 mg by mouth daily.    . Fluticasone-Salmeterol (ADVAIR) 500-50 MCG/DOSE AEPB Inhale 1 puff into the lungs 2 (two) times daily.    . hydrOXYzine (ATARAX/VISTARIL) 25 MG tablet Take 25 mg by mouth 3 (three) times daily.  2  . ibuprofen (ADVIL) 600 MG tablet Take 600 mg by mouth 3 (three) times daily as needed for moderate pain.    Marland Kitchen lamoTRIgine (LAMICTAL) 200 MG tablet Take 200 mg by mouth at bedtime.    . metoprolol succinate (TOPROL-XL) 50 MG 24 hr tablet Take 1 tablet (50 mg total) by mouth daily. Take with or immediately following a meal. 90 tablet 3  . mirtazapine (REMERON) 15 MG tablet Take 15 mg by mouth at bedtime.    . Multiple Vitamin (MULTIVITAMIN WITH MINERALS) TABS Take 1 tablet by mouth daily. For nutritional supplementation. 30 tablet 0  . polyethylene glycol (MIRALAX / GLYCOLAX) packet Take 17 g by mouth daily as needed for mild constipation. (Patient taking differently: Take 17 g by mouth daily. ) 14 each 0  . tiZANidine (ZANAFLEX) 4 MG tablet Take 4 mg by mouth 3 (three) times daily as needed for muscle spasms.    Marland Kitchen VITAMIN D PO Take 1  capsule by mouth daily.     . Vitamin D, Ergocalciferol, (DRISDOL) 1.25 MG (50000 UNIT) CAPS capsule Take 1 capsule (50,000 Units total) by mouth every 7 (seven) days. 12 capsule 0  . ziprasidone (GEODON) 20 MG capsule Take 20 mg by mouth 2 (two) times daily with a meal.    . ibuprofen (ADVIL) 800 MG tablet TAKE 1 TABLET(800 MG) BY MOUTH EVERY 8 HOURS AS NEEDED (Patient not taking: Reported on 04/30/2020) 270 tablet 0   No facility-administered medications prior to visit.     Review of Systems:   Constitutional:   No  weight loss, night sweats,  Fevers, chills, fatigue, or  lassitude.  HEENT:   No headaches,  Difficulty swallowing,  Tooth/dental problems, or  Sore throat,                No sneezing, itching, ear ache,  nasal congestion, post nasal drip,   CV:  No chest pain,  Orthopnea, PND, swelling in lower extremities, anasarca, dizziness, palpitations, syncope.   GI  No heartburn, indigestion, abdominal pain, nausea, vomiting, diarrhea, change in bowel habits, loss of appetite, bloody stools.   Resp: No shortness of breath with exertion or at rest.  No excess mucus, no productive cough,  No non-productive cough,  No coughing up of blood.  No change in color of mucus.  No wheezing.  No chest wall deformity  Skin: no rash or lesions.  GU: no dysuria, change in color of urine, no urgency or frequency.  No flank pain, no hematuria   MS: Knee pain+    Physical Exam  BP (S) (!) 160/100 (BP Location: Right Arm, Patient Position: Sitting, Cuff Size: Large) Comment: has not had bp meds yet  Pulse 80   Temp (!) 97.2 F (36.2 C) (Temporal)   Ht 5\' 9"  (1.753 m)   Wt 278 lb 6.4 oz (126.3 kg)   SpO2 97%   BMI 41.11 kg/m   GEN: A/Ox3; pleasant , NAD, well nourished , BMI 41   HEENT:  [place]/AT,  , NOSE-clear, THROAT-clear, no lesions, no postnasal drip or exudate noted.   NECK:  Supple w/ fair ROM; no JVD; normal carotid impulses w/o bruits; no thyromegaly or nodules palpated; no  lymphadenopathy.    RESP  Clear  P & A; w/o, wheezes/ rales/ or rhonchi. no accessory muscle use, no dullness to percussion  CARD:  RRR, no m/r/g, no peripheral edema, pulses intact, no cyanosis or clubbing.  GI:   Soft & nt; nml bowel sounds; no organomegaly or masses detected.   Musco: Warm bil, no deformities or joint swelling noted.   Neuro: alert, no focal deficits noted.    Skin: Warm, no lesions or rashes    Lab Results:   BMET  BNP    No flowsheet data found.  No results found for: NITRICOXIDE      Assessment & Plan:   No problem-specific Assessment & Plan notes found for this encounter.     Rexene Edison, NP 04/30/2020

## 2020-05-02 NOTE — Progress Notes (Signed)
Routed to normal results pool.

## 2020-05-06 DIAGNOSIS — J9611 Chronic respiratory failure with hypoxia: Secondary | ICD-10-CM

## 2020-05-06 HISTORY — DX: Chronic respiratory failure with hypoxia: J96.11

## 2020-05-06 NOTE — Assessment & Plan Note (Signed)
Nocturnal hypoxemia on oxygen at bedtime.  Have asked her to obtain a copy of her sleep study results and bring it to next visit. We will check an overnight oximetry test on room air to evaluate if nocturnal hypoxemia continues.  And will decide if she needs to continue on nocturnal oxygen.

## 2020-05-06 NOTE — Assessment & Plan Note (Signed)
Continue on current regimen .   

## 2020-05-06 NOTE — Assessment & Plan Note (Addendum)
Asthma appears to be under good control. Check chest x-ray today. Needs PFTs. We will discuss pulmonary preop clearance on return visit.  Plan  Patient Instructions  Set up for Overnight oximetry test on room air.  Set up for PFT  Chest xray today.  Continue on Advair 1 puff Twice daily  , rinse after use.  Albuterol As needed   Obtain sleep study results-bring to next visit or fax to our office.  Follow up with Dr. Annamaria Boots  In 2-3 weeks for preop clearance.  Please contact office for sooner follow up if symptoms do not improve or worsen or seek emergency care     '

## 2020-05-06 NOTE — Assessment & Plan Note (Signed)
Blood pressure is elevated.  No neurological symptoms and exam unrevealing  advised for low-salt diet.  Advised to avoid nonsteroidals.  Advise follow-up PCP.

## 2020-05-10 DIAGNOSIS — H2511 Age-related nuclear cataract, right eye: Secondary | ICD-10-CM | POA: Diagnosis not present

## 2020-05-10 DIAGNOSIS — H2513 Age-related nuclear cataract, bilateral: Secondary | ICD-10-CM | POA: Diagnosis not present

## 2020-05-10 DIAGNOSIS — H25013 Cortical age-related cataract, bilateral: Secondary | ICD-10-CM | POA: Diagnosis not present

## 2020-05-14 ENCOUNTER — Encounter: Payer: Medicare HMO | Admitting: Adult Health

## 2020-05-14 ENCOUNTER — Encounter: Payer: Self-pay | Admitting: Adult Health

## 2020-05-14 ENCOUNTER — Other Ambulatory Visit: Payer: Self-pay

## 2020-05-21 DIAGNOSIS — G4730 Sleep apnea, unspecified: Secondary | ICD-10-CM | POA: Diagnosis not present

## 2020-05-21 DIAGNOSIS — R0683 Snoring: Secondary | ICD-10-CM | POA: Diagnosis not present

## 2020-05-27 ENCOUNTER — Ambulatory Visit (INDEPENDENT_AMBULATORY_CARE_PROVIDER_SITE_OTHER): Payer: Medicare HMO | Admitting: Family Medicine

## 2020-05-27 ENCOUNTER — Other Ambulatory Visit: Payer: Self-pay

## 2020-05-27 ENCOUNTER — Encounter: Payer: Self-pay | Admitting: Family Medicine

## 2020-05-27 VITALS — BP 128/82 | HR 75 | Temp 97.9°F | Wt 282.4 lb

## 2020-05-27 DIAGNOSIS — R635 Abnormal weight gain: Secondary | ICD-10-CM | POA: Diagnosis not present

## 2020-05-27 DIAGNOSIS — Z6841 Body Mass Index (BMI) 40.0 and over, adult: Secondary | ICD-10-CM

## 2020-05-27 NOTE — Progress Notes (Signed)
Subjective:    Patient ID: [name], female    DOB: 01/14/1961, 59 y.o.   MRN: [id number]  No chief complaint on file.   HPI Patient was seen today for f/u.  Pt trying to lose weight so she can have R TKR.  Pt notes she initially lost weight after cutting out soda and decreasing intake of pasta and potatoes, but gained it back.  Pt eating baked chicken, ground [place], and baked fish.  Eating broccoli, spinach, cabbage and eating some fruit. Pt will have a breakfast bar in the morning and then eats again at 5 PM for dinner. Pt working on portions. Pt doing chair exercises and plans to start water aerobics in January. Pt drinking 6-7 glasses of water per day. May have a glass of [place] punch daily. Pt has not heard back from weight management clinic. Pt has to get down to 250 lbs in order to have the knee surgery.  Past Medical History:  Diagnosis Date  . Anemia   . Arthritis   . Arthropathy, unspecified, site unspecified   . Asthma   . Backache, unspecified   . Bipolar I disorder 06/13/2008   Qualifier: Diagnosis of  By: Burt Knack CMA, Cecille Rubin    . Dysphagia, pharyngoesophageal phase 09/12/2008   Qualifier: Diagnosis of  By: Joya Gaskins MD, Burnett Harry   . Fibromyalgia   . Generalized anxiety disorder 04/05/2012  . GERD (gastroesophageal reflux disease) 07/05/2008   Qualifier: Diagnosis of  By: Joya Gaskins MD, Burnett Harry   . Heart disease 12/19/2018  . Hypertension   . Intractable migraine with aura 11/03/2013  . Lumbar radiculopathy 08/30/2012  . Major depressive disorder 09/08/2019  . Major depressive disorder, recurrent, severe with psychotic features 04/05/2012  . Mild neurocognitive disorder, unclear etiology 09/08/2019  . Multiple sclerosis 11/03/2013   remission at present(past hx. bedridden) -ambulates with cane-left side weaker  . Obstructive sleep apnea 06/13/2008   no cpap-uses Oxygen nightly 2l/m   . On home oxygen therapy    nightly at 2l/m  . Other abnormal glucose    05-19-13 no  problems now  . Seizure disorder    last seizure 2014  . Thyroid disease 10/03/2013    Allergies  Allergen Reactions  . Bee Venom Anaphylaxis  . Depakote [Divalproex Sodium] Shortness Of Breath and Rash  . Haloperidol Lactate Swelling  . Hornet Venom Anaphylaxis  . Latex Anaphylaxis    mild rash/itching, wheezing/sob  . Peanut-Containing Drug Products Anaphylaxis  . Penicillins Anaphylaxis and Swelling  . Quetiapine Anaphylaxis and Other (See Comments)  . Shellfish-Derived Products Anaphylaxis  . Valproic Acid Swelling  . Amitriptyline Hcl Other (See Comments)    HALLUCINATIONS  . Butorphanol Tartrate Other (See Comments)    Hallucinations  . Gabapentin Other (See Comments)    Pt reports severe back and side pain  . Iodine     Flushing and fainting  . Pregabalin Rash  . Terbutaline Rash    ROS General: Denies fever, chills, night sweats,  changes in appetite  +weight gain HEENT: Denies headaches, ear pain, changes in vision, rhinorrhea, sore throat CV: Denies CP, palpitations, SOB, orthopnea Pulm: Denies SOB, cough, wheezing GI: Denies abdominal pain, nausea, vomiting, diarrhea, constipation GU: Denies dysuria, hematuria, frequency, vaginal discharge Msk: Denies muscle cramps, joint pains   Neuro: Denies weakness, numbness, tingling Skin: Denies rashes, bruising Psych: Denies depression, anxiety, hallucinations    Objective:    Blood pressure 128/82, pulse 75, temperature 97.9 F (36.6 C), temperature source Oral,  weight 282 lb 6.4 oz (128.1 kg), SpO2 96 %.  Gen. Pleasant, well-nourished, in no distress, obese, normal affect   HEENT: [place]/AT, face symmetric, conjunctiva clear, no scleral icterus, PERRLA, EOMI, nares patent without drainage Lungs: no accessory muscle use Cardiovascular: RRR, no peripheral edema Musculoskeletal: No deformities, no cyanosis or clubbing, normal tone Neuro:  A&Ox3, CN II-XII intact, ambulating with a cane Skin:  Warm, no lesions/  rash   Wt Readings from Last 3 Encounters:  05/14/20 271 lb 12.8 oz (123.3 kg)  04/30/20 278 lb 6.4 oz (126.3 kg)  04/24/20 281 lb 6.4 oz (127.6 kg)    Lab Results  Component Value Date   WBC 4.6 04/24/2020   HGB 13.4 04/24/2020   HCT 42.8 04/24/2020   PLT 304 04/24/2020   GLUCOSE 88 04/24/2020   CHOL 262 (H) 04/24/2020   TRIG 91 04/24/2020   HDL 71 04/24/2020   LDLCALC 170 (H) 04/24/2020   ALT 17 07/20/2018   AST 15 07/20/2018   NA 140 04/24/2020   K 3.9 04/24/2020   CL 104 04/24/2020   CREATININE 1.10 (H) 04/24/2020   BUN 11 04/24/2020   CO2 29 04/24/2020   TSH 0.92 04/24/2020   INR 0.99 12/25/2011   HGBA1C 5.9 (H) 04/24/2020    Assessment/Plan:  Weight gain -10 lb weight gain since 05/14/20.  Now 282.4 lbs -Concern BH meds may be contributing to weight gain. -Globin A1c 5.9% on 04/24/2020 -discussed portion sizes, decreasing carbs, and sugar intake. -pt encouraged to keep a food diary -Continue chair exercises.  Discussed other ways to increase physical activity -Patient advised to try eating several small meals per day to increase metabolism. -Given handout about calorie counting. -Also consider using my fitness pal app  Class 3 severe obesity due to excess calories with serious comorbidity and body mass index (BMI) of 40.0 to 44.9 in adult [facility]) -Discussed contacting weight management for information regarding appointment. -Continue lifestyle modifications  F/u in the next few months  Grier Mitts, MD

## 2020-05-27 NOTE — Progress Notes (Deleted)
Subjective:    Patient ID: [name], female    DOB: 07-11-1960, 59 y.o.   MRN: [id number] HPI F former smoker followed for asthma and allergic rhinitis, complicated by GERD, depression, VCD, seizure disorder, Bipolar,  PFT 07/05/08- FVC 3.51/ 88%, FEV1 2.38/ 78%, R 0.68, 25-75% 1.45/ 44%, TLC 83%, DLCO 68% Allergy Profile 01/18/2013-total IgE 242.4 with broad elevations for common inhalant allergens. -----------------------------------------------------------------------------------   12/22/2018- 59 yoF  former smoker followed for asthma and allergic rhinitis, complicated by GERD, depression, VCD, seizure disorder O2 2L sleep/ [employer] Advair 500, ProAir hfa, Epipen, ------last seen in 2015, pt states breathing has not been stable, having lots of wheezing  She tells me she had done very well fora long time, out of her inhaled meds. Then she cleaned dusty closets using Lysol spray and had flare for which she called requesting meds till this appointment. Now feels almost back to baseline. Asthma not disturbing sleep.  Has slept w O2 2L for years. Aware of seasonal allergy triggers- pollens.Denies heart problems, edema, palpitation, fever.  Activity limited by herniated disk- rolling walker.  CXR 01/09/18- Lungs are clear. Heart size and pulmonary vascularity are normal. No adenopathy. No bone lesions. IMPRESSION: No edema or consolidation.  05/28/20- 59 yoF  former smoker followed for asthma and allergic rhinitis, complicated by GERD, depression, VCD, seizure disorder, BIPOLAR,  O2 2L sleep/ [employer] Advair 500, ProAir hfa, Epipen, NP visit 04/30/20- BP was 160/100. Ordered ONOX, PFT   (neither done??) Pending R TKR, needing clearance. Covid vax- Flu vax-   ROS-see HPI  + = positive Constitutional:   No-   weight loss, night sweats, fevers, chills, fatigue, lassitude. HEENT:   No-  headaches, difficulty swallowing, tooth/dental problems, sore throat,       +sneezing, itching, ear  ache, nasal congestion, post nasal drip,  CV:  No-   chest pain, orthopnea, PND, swelling in lower extremities, anasarca, dizziness, palpitations Resp: No-   shortness of breath with exertion or at rest.              No-   productive cough,  No non-productive cough,  No- coughing up of blood.              No-   change in color of mucus.  No- wheezing now.   Skin: + rash per HPI GI:  No-   heartburn, indigestion, abdominal pain, nausea, vomiting,  GU:  MS:  No-   joint pain or swelling.   Neuro-     nothing unusual Psych:  No- change in mood or affect. No depression or anxiety.  No memory loss.  Objective:  OBJ- Physical Exam General- Alert, Oriented, Affect-appropriate, Distress- none acute. + obese Skin- rash-none, lesions- none, excoriation- none Lymphadenopathy- none Head- atraumatic            Eyes- Gross vision intact, PERRLA, conjunctivae and secretions clear            Ears- Hearing, canals-normal            Nose- Clear, no-Septal dev, mucus, polyps, erosion, perforation             Throat- Mallampati II-III , mucosa clear , drainage- none, tonsils- atrophic Neck- flexible , trachea midline, no stridor , thyroid nl, carotid no bruit Chest - symmetrical excursion , unlabored           Heart/CV- RRR , no murmur , no gallop  , no rub, nl s1 s2                           -  JVD- none , edema- none, stasis changes- none, varices- none           Lung- clear to P&A, wheeze- none, cough- none , dullness-none, rub- none           Chest wall-  Abd-  Br/ Gen/ Rectal- Not done, not indicated Extrem- cyanosis- none, clubbing, none, atrophy- none, strength- nl.  Neuro- grossly intact to observation  Assessment & Plan:

## 2020-05-27 NOTE — Patient Instructions (Addendum)
You can use my fitness pal app to help you track calories.  Obesity, Adult Obesity is having too much body fat. Being obese means that your weight is more than what is healthy for you. BMI is a number that explains how much body fat you have. If you have a BMI of 30 or more, you are obese. Obesity is often caused by eating or drinking more calories than your body uses. Changing your lifestyle can help you lose weight. Obesity can cause serious health problems, such as:  Stroke.  Coronary artery disease (CAD).  Type 2 diabetes.  Some types of cancer, including cancers of the colon, breast, uterus, and gallbladder.  Osteoarthritis.  High blood pressure (hypertension).  High cholesterol.  Sleep apnea.  Gallbladder stones.  Infertility problems. What are the causes?  Eating meals each day that are high in calories, sugar, and fat.  Being born with genes that may make you more likely to become obese.  Having a medical condition that causes obesity.  Taking certain medicines.  Sitting a lot (having a sedentary lifestyle).  Not getting enough sleep.  Drinking a lot of drinks that have sugar in them. What increases the risk?  Having a family history of obesity.  Being an [place] American woman.  Being a Hispanic man.  Living in an area with limited access to: ? Romilda Garret, recreation centers, or sidewalks. ? Healthy food choices, such as grocery stores and farmers' markets. What are the signs or symptoms? The main sign is having too much body fat. How is this treated?  Treatment for this condition often includes changing your lifestyle. Treatment may include: ? Changing your diet. This may include making a healthy meal plan. ? Exercise. This may include activity that causes your heart to beat faster (aerobic exercise) and strength training. Work with your doctor to design a program that works for you. ? Medicine to help you lose weight. This may be used if you are not  able to lose 1 pound a week after 6 weeks of healthy eating and more exercise. ? Treating conditions that cause the obesity. ? Surgery. Options may include gastric banding and gastric bypass. This may be done if:  Other treatments have not helped to improve your condition.  You have a BMI of 40 or higher.  You have life-threatening health problems related to obesity. Follow these instructions at home: Eating and drinking   Follow advice from your doctor about what to eat and drink. Your doctor may tell you to: ? Limit fast food, sweets, and processed snack foods. ? Choose low-fat options. For example, choose low-fat milk instead of whole milk. ? Eat 5 or more servings of fruits or vegetables each day. ? Eat at home more often. This gives you more control over what you eat. ? Choose healthy foods when you eat out. ? Learn to read food labels. This will help you learn how much food is in 1 serving. ? Keep low-fat snacks available. ? Avoid drinks that have a lot of sugar in them. These include soda, fruit juice, iced tea with sugar, and flavored milk.  Drink enough water to keep your pee (urine) pale yellow.  Do not go on fad diets. Physical activity  Exercise often, as told by your doctor. Most adults should get up to 150 minutes of moderate-intensity exercise every week.Ask your doctor: ? What types of exercise are safe for you. ? How often you should exercise.  Warm up and stretch before being  active.  Do slow stretching after being active (cool down).  Rest between times of being active. Lifestyle  Work with your doctor and a food expert (dietitian) to set a weight-loss goal that is best for you.  Limit your screen time.  Find ways to reward yourself that do not involve food.  Do not drink alcohol if: ? Your doctor tells you not to drink. ? You are pregnant, may be pregnant, or are planning to become pregnant.  If you drink alcohol: ? Limit how much you use  to:  0-1 drink a day for women.  0-2 drinks a day for men. ? Be aware of how much alcohol is in your drink. In the [place] one drink equals one 12 oz bottle of beer (355 mL), one 5 oz glass of wine (148 mL), or one 1 oz glass of hard liquor (44 mL). General instructions  Keep a weight-loss journal. This can help you keep track of: ? The food that you eat. ? How much exercise you get.  Take over-the-counter and prescription medicines only as told by your doctor.  Take vitamins and supplements only as told by your doctor.  Think about joining a support group.  Keep all follow-up visits as told by your doctor. This is important. Contact a doctor if:  You cannot meet your weight loss goal after you have changed your diet and lifestyle for 6 weeks. Get help right away if you:  Are having trouble breathing.  Are having thoughts of harming yourself. Summary  Obesity is having too much body fat.  Being obese means that your weight is more than what is healthy for you.  Work with your doctor to set a weight-loss goal.  Get regular exercise as told by your doctor. This information is not intended to replace advice given to you by your health care provider. Make sure you discuss any questions you have with your health care provider. Document Revised: 02/24/2018 Document Reviewed: 02/24/2018 Elsevier Patient Education  2020 [place] [employer] Calories are units of energy. Your body needs a certain amount of calories from food to keep you going throughout the day. When you eat more calories than your body needs, your body stores the extra calories as fat. When you eat fewer calories than your body needs, your body burns fat to get the energy it needs. Calorie counting means keeping track of how many calories you eat and drink each day. Calorie counting can be helpful if you need to lose weight. If you make sure to eat fewer calories than your body needs, you  should lose weight. Ask your health care provider what a healthy weight is for you. For calorie counting to work, you will need to eat the right number of calories in a day in order to lose a healthy amount of weight per week. A dietitian can help you determine how many calories you need in a day and will give you suggestions on how to reach your calorie goal.  A healthy amount of weight to lose per week is usually 1-2 lb (0.5-0.9 kg). This usually means that your daily calorie intake should be reduced by 500-750 calories.  Eating 1,200 - 1,500 calories per day can help most women lose weight.  Eating 1,500 - 1,800 calories per day can help most men lose weight. What is my plan? My goal is to have __________ calories per day. If I have this many calories per day, I  should lose around __________ pounds per week. What do I need to know about calorie counting? In order to meet your daily calorie goal, you will need to:  Find out how many calories are in each food you would like to eat. Try to do this before you eat.  Decide how much of the food you plan to eat.  Write down what you ate and how many calories it had. Doing this is called keeping a food log. To successfully lose weight, it is important to balance calorie counting with a healthy lifestyle that includes regular activity. Aim for 150 minutes of moderate exercise (such as walking) or 75 minutes of vigorous exercise (such as running) each week. Where do I find calorie information?  The number of calories in a food can be found on a Nutrition Facts label. If a food does not have a Nutrition Facts label, try to look up the calories online or ask your dietitian for help. Remember that calories are listed per serving. If you choose to have more than one serving of a food, you will have to multiply the calories per serving by the amount of servings you plan to eat. For example, the label on a package of bread might say that a serving size is  1 slice and that there are 90 calories in a serving. If you eat 1 slice, you will have eaten 90 calories. If you eat 2 slices, you will have eaten 180 calories. How do I keep a food log? Immediately after each meal, record the following information in your food log:  What you ate. Don't forget to include toppings, sauces, and other extras on the food.  How much you ate. This can be measured in cups, ounces, or number of items.  How many calories each food and drink had.  The total number of calories in the meal. Keep your food log near you, such as in a small notebook in your pocket, or use a mobile app or website. Some programs will calculate calories for you and show you how many calories you have left for the day to meet your goal. What are some calorie counting tips?   Use your calories on foods and drinks that will fill you up and not leave you hungry: ? Some examples of foods that fill you up are nuts and nut butters, vegetables, lean proteins, and high-fiber foods like whole grains. High-fiber foods are foods with more than 5 g fiber per serving. ? Drinks such as sodas, specialty coffee drinks, alcohol, and juices have a lot of calories, yet do not fill you up.  Eat nutritious foods and avoid empty calories. Empty calories are calories you get from foods or beverages that do not have many vitamins or protein, such as candy, sweets, and soda. It is better to have a nutritious high-calorie food (such as an avocado) than a food with few nutrients (such as a bag of chips).  Know how many calories are in the foods you eat most often. This will help you calculate calorie counts faster.  Pay attention to calories in drinks. Low-calorie drinks include water and unsweetened drinks.  Pay attention to nutrition labels for "low fat" or "fat free" foods. These foods sometimes have the same amount of calories or more calories than the full fat versions. They also often have added sugar, starch, or  salt, to make up for flavor that was removed with the fat.  Find a way of tracking calories that  works for you. Get creative. Try different apps or programs if writing down calories does not work for you. What are some portion control tips?  Know how many calories are in a serving. This will help you know how many servings of a certain food you can have.  Use a measuring cup to measure serving sizes. You could also try weighing out portions on a kitchen scale. With time, you will be able to estimate serving sizes for some foods.  Take some time to put servings of different foods on your favorite plates, bowls, and cups so you know what a serving looks like.  Try not to eat straight from a bag or box. Doing this can lead to overeating. Put the amount you would like to eat in a cup or on a plate to make sure you are eating the right portion.  Use smaller plates, glasses, and bowls to prevent overeating.  Try not to multitask (for example, watch TV or use your computer) while eating. If it is time to eat, sit down at a table and enjoy your food. This will help you to know when you are full. It will also help you to be aware of what you are eating and how much you are eating. What are tips for following this plan? Reading food labels  Check the calorie count compared to the serving size. The serving size may be smaller than what you are used to eating.  Check the source of the calories. Make sure the food you are eating is high in vitamins and protein and low in saturated and trans fats. Shopping  Read nutrition labels while you shop. This will help you make healthy decisions before you decide to purchase your food.  Make a grocery list and stick to it. Cooking  Try to cook your favorite foods in a healthier way. For example, try baking instead of frying.  Use low-fat dairy products. Meal planning  Use more fruits and vegetables. Half of your plate should be fruits and  vegetables.  Include lean proteins like poultry and fish. How do I count calories when eating out?  Ask for smaller portion sizes.  Consider sharing an entree and sides instead of getting your own entree.  If you get your own entree, eat only half. Ask for a box at the beginning of your meal and put the rest of your entree in it so you are not tempted to eat it.  If calories are listed on the menu, choose the lower calorie options.  Choose dishes that include vegetables, fruits, whole grains, low-fat dairy products, and lean protein.  Choose items that are boiled, broiled, grilled, or steamed. Stay away from items that are buttered, battered, fried, or served with cream sauce. Items labeled "crispy" are usually fried, unless stated otherwise.  Choose water, low-fat milk, unsweetened iced tea, or other drinks without added sugar. If you want an alcoholic beverage, choose a lower calorie option such as a glass of wine or light beer.  Ask for dressings, sauces, and syrups on the side. These are usually high in calories, so you should limit the amount you eat.  If you want a salad, choose a garden salad and ask for grilled meats. Avoid extra toppings like bacon, cheese, or fried items. Ask for the dressing on the side, or ask for olive oil and vinegar or lemon to use as dressing.  Estimate how many servings of a food you are given. For example, a serving  of cooked rice is  cup or about the size of half a baseball. Knowing serving sizes will help you be aware of how much food you are eating at restaurants. The list below tells you how big or small some common portion sizes are based on everyday objects: ? 1 oz--4 stacked dice. ? 3 oz--1 deck of cards. ? 1 tsp--1 die. ? 1 Tbsp-- a ping-pong ball. ? 2 Tbsp--1 ping-pong ball. ?  cup-- baseball. ? 1 cup--1 baseball. Summary  Calorie counting means keeping track of how many calories you eat and drink each day. If you eat fewer calories  than your body needs, you should lose weight.  A healthy amount of weight to lose per week is usually 1-2 lb (0.5-0.9 kg). This usually means reducing your daily calorie intake by 500-750 calories.  The number of calories in a food can be found on a Nutrition Facts label. If a food does not have a Nutrition Facts label, try to look up the calories online or ask your dietitian for help.  Use your calories on foods and drinks that will fill you up, and not on foods and drinks that will leave you hungry.  Use smaller plates, glasses, and bowls to prevent overeating. This information is not intended to replace advice given to you by your health care provider. Make sure you discuss any questions you have with your health care provider. Document Revised: 03/11/2018 Document Reviewed: 05/22/2016 Elsevier Patient Education  [employer].  Preventing Unhealthy [employer], Adult Staying at a healthy weight is important to your overall health. When fat builds up in your body, you may become overweight or obese. Being overweight or obese increases your risk of developing certain health problems, such as heart disease, diabetes, sleeping problems, joint problems, and some types of cancer. Unhealthy weight gain is often the result of making unhealthy food choices or not getting enough exercise. You can make changes to your lifestyle to prevent obesity and stay as healthy as possible. What nutrition changes can be made?   Eat only as much as your body needs. To do this: ? Pay attention to signs that you are hungry or full. Stop eating as soon as you feel full. ? If you feel hungry, try drinking water first before eating. Drink enough water so your urine is clear or pale yellow. ? Eat smaller portions. Pay attention to portion sizes when eating out. ? Look at serving sizes on food labels. Most foods contain more than one serving per container. ? Eat the recommended number of calories for your gender  and activity level. For most active people, a daily total of 2,000 calories is appropriate. If you are trying to lose weight or are not very active, you may need to eat fewer calories. Talk with your health care provider or a diet and nutrition specialist (dietitian) about how many calories you need each day.  Choose healthy foods, such as: ? Fruits and vegetables. At each meal, try to fill at least half of your plate with fruits and vegetables. ? Whole grains, such as whole-wheat bread, brown rice, and quinoa. ? Lean meats, such as chicken or fish. ? Other healthy proteins, such as beans, eggs, or tofu. ? Healthy fats, such as nuts, seeds, fatty fish, and olive oil. ? Low-fat or fat-free dairy products.  Check food labels, and avoid food and drinks that: ? Are high in calories. ? Have added sugar. ? Are high in sodium. ? Have saturated  fats or trans fats.  Cook foods in healthier ways, such as by baking, broiling, or grilling.  Make a meal plan for the week, and shop with a grocery list to help you stay on track with your purchases. Try to avoid going to the grocery store when you are hungry.  When grocery shopping, try to shop around the outside of the store first, where the fresh foods are. Doing this helps you to avoid prepackaged foods, which can be high in sugar, salt (sodium), and fat. What lifestyle changes can be made?   Exercise for 30 or more minutes on 5 or more days each week. Exercising may include brisk walking, yard work, biking, running, swimming, and team sports like basketball and soccer. Ask your health care provider which exercises are safe for you.  Do muscle-strengthening activities, such as lifting weights or using resistance bands, on 2 or more days a week.  Do not use any products that contain nicotine or tobacco, such as cigarettes and e-cigarettes. If you need help quitting, ask your health care provider.  Limit alcohol intake to no more than 1 drink a day  for nonpregnant women and 2 drinks a day for men. One drink equals 12 oz of beer, 5 oz of wine, or 1 oz of hard liquor.  Try to get 7-9 hours of sleep each night. What other changes can be made?  Keep a food and activity journal to keep track of: ? What you ate and how many calories you had. Remember to count the calories in sauces, dressings, and side dishes. ? Whether you were active, and what exercises you did. ? Your calorie, weight, and activity goals.  Check your weight regularly. Track any changes. If you notice you have gained weight, make changes to your diet or activity routine.  Avoid taking weight-loss medicines or supplements. Talk to your health care provider before starting any new medicine or supplement.  Talk to your health care provider before trying any new diet or exercise plan. Why are these changes important? Eating healthy, staying active, and having healthy habits can help you to prevent obesity. Those changes also:  Help you manage stress and emotions.  Help you connect with friends and family.  Improve your self-esteem.  Improve your sleep.  Prevent long-term health problems. What can happen if changes are not made? Being obese or overweight can cause you to develop joint or bone problems, which can make it hard for you to stay active or do activities you enjoy. Being obese or overweight also puts stress on your heart and lungs and can lead to health problems like diabetes, heart disease, and some cancers. Where to find more information Talk with your health care provider or a dietitian about healthy eating and healthy lifestyle choices. You may also find information from:  [place]. Department of Agriculture, MyPlate: FormerBoss.no  American Heart Association: www.heart.org  Centers for Disease Control and Prevention: http://www.wolf.info/ Summary  Staying at a healthy weight is important to your overall health. It helps you to prevent certain diseases and  health problems, such as heart disease, diabetes, joint problems, sleep disorders, and some types of cancer.  Being obese or overweight can cause you to develop joint or bone problems, which can make it hard for you to stay active or do activities you enjoy.  You can prevent unhealthy weight gain by eating a healthy diet, exercising regularly, not smoking, limiting alcohol, and getting enough sleep.  Talk with your health care provider  or a dietitian for guidance about healthy eating and healthy lifestyle choices. This information is not intended to replace advice given to you by your health care provider. Make sure you discuss any questions you have with your health care provider. Document Revised: 06/25/2017 Document Reviewed: 07/29/2016 Elsevier Patient Education  2020 [employer].

## 2020-05-28 ENCOUNTER — Ambulatory Visit: Payer: Medicare HMO | Admitting: Internal Medicine

## 2020-05-29 DIAGNOSIS — H2511 Age-related nuclear cataract, right eye: Secondary | ICD-10-CM | POA: Diagnosis not present

## 2020-05-29 DIAGNOSIS — H25811 Combined forms of age-related cataract, right eye: Secondary | ICD-10-CM | POA: Diagnosis not present

## 2020-06-13 DIAGNOSIS — H2512 Age-related nuclear cataract, left eye: Secondary | ICD-10-CM | POA: Diagnosis not present

## 2020-06-13 DIAGNOSIS — H25012 Cortical age-related cataract, left eye: Secondary | ICD-10-CM | POA: Diagnosis not present

## 2020-07-09 ENCOUNTER — Emergency Department (HOSPITAL_COMMUNITY): Payer: Medicare HMO

## 2020-07-09 ENCOUNTER — Other Ambulatory Visit: Payer: Self-pay

## 2020-07-09 ENCOUNTER — Ambulatory Visit (HOSPITAL_COMMUNITY): Admission: EM | Admit: 2020-07-09 | Discharge: 2020-07-09 | Payer: Medicare HMO

## 2020-07-09 ENCOUNTER — Emergency Department (HOSPITAL_COMMUNITY)
Admission: EM | Admit: 2020-07-09 | Discharge: 2020-07-10 | Disposition: A | Discharge status: Home / Self Care | Payer: Medicare HMO | Source: Home / Self Care

## 2020-07-09 ENCOUNTER — Encounter (HOSPITAL_COMMUNITY): Payer: Self-pay | Admitting: Emergency Medicine

## 2020-07-09 ENCOUNTER — Emergency Department (HOSPITAL_COMMUNITY)
Admission: EM | Admit: 2020-07-09 | Discharge: 2020-07-09 | Disposition: A | Discharge status: Home / Self Care | Payer: Medicare HMO | Attending: Emergency Medicine | Admitting: Emergency Medicine

## 2020-07-09 DIAGNOSIS — S0990XA Unspecified injury of head, initial encounter: Secondary | ICD-10-CM | POA: Diagnosis not present

## 2020-07-09 DIAGNOSIS — W19XXXA Unspecified fall, initial encounter: Secondary | ICD-10-CM

## 2020-07-09 DIAGNOSIS — M25512 Pain in left shoulder: Secondary | ICD-10-CM | POA: Insufficient documentation

## 2020-07-09 DIAGNOSIS — M25519 Pain in unspecified shoulder: Secondary | ICD-10-CM | POA: Diagnosis not present

## 2020-07-09 DIAGNOSIS — S8292XA Unspecified fracture of left lower leg, initial encounter for closed fracture: Secondary | ICD-10-CM | POA: Insufficient documentation

## 2020-07-09 DIAGNOSIS — W010XXA Fall on same level from slipping, tripping and stumbling without subsequent striking against object, initial encounter: Secondary | ICD-10-CM | POA: Diagnosis not present

## 2020-07-09 DIAGNOSIS — S8292XD Unspecified fracture of left lower leg, subsequent encounter for closed fracture with routine healing: Secondary | ICD-10-CM | POA: Insufficient documentation

## 2020-07-09 DIAGNOSIS — Z5321 Procedure and treatment not carried out due to patient leaving prior to being seen by health care provider: Secondary | ICD-10-CM | POA: Insufficient documentation

## 2020-07-09 DIAGNOSIS — R52 Pain, unspecified: Secondary | ICD-10-CM | POA: Diagnosis not present

## 2020-07-09 DIAGNOSIS — W19XXXD Unspecified fall, subsequent encounter: Secondary | ICD-10-CM | POA: Insufficient documentation

## 2020-07-09 DIAGNOSIS — I10 Essential (primary) hypertension: Secondary | ICD-10-CM | POA: Diagnosis not present

## 2020-07-09 DIAGNOSIS — M25572 Pain in left ankle and joints of left foot: Secondary | ICD-10-CM | POA: Diagnosis not present

## 2020-07-09 LAB — BASIC METABOLIC PANEL
Anion gap: 13 (ref 5–15)
BUN: 13 mg/dL (ref 6–20)
CO2: 23 mmol/L (ref 22–32)
Calcium: 9.3 mg/dL (ref 8.9–10.3)
Chloride: 102 mmol/L (ref 98–111)
Creatinine, Ser: 1.04 mg/dL — ABNORMAL HIGH (ref 0.44–1.00)
GFR, Estimated: >60 mL/min (ref >60)
Glucose, Bld: 136 mg/dL — ABNORMAL HIGH (ref 70–99)
Potassium: 3.2 mmol/L — ABNORMAL LOW (ref 3.5–5.1)
Sodium: 138 mmol/L (ref 135–145)

## 2020-07-09 LAB — CBC
HCT: 39.3 % (ref 36.0–46.0)
Hemoglobin: 13.3 g/dL (ref 12.0–15.0)
MCH: 26.5 pg (ref 26.0–34.0)
MCHC: 33.8 g/dL (ref 30.0–36.0)
MCV: 78.3 fL — ABNORMAL LOW (ref 80.0–100.0)
Platelets: 306 10*3/uL (ref 150–400)
RBC: 5.02 MIL/uL (ref 3.87–5.11)
RDW: 15 % (ref 11.5–15.5)
WBC: 6.8 10*3/uL (ref 4.0–10.5)
nRBC: 0 % (ref 0.0–0.2)

## 2020-07-09 NOTE — ED Provider Notes (Signed)
Patient had a fall today Went to [facility] rays done She left due to wait She came here but the x rays show a displaced ankle fracture Sent back to ER   Eustace Moore, MD 07/09/20 1930

## 2020-07-09 NOTE — Progress Notes (Deleted)
Subjective:    Patient ID: [name], female    DOB: April 17, 1961, 60 y.o.   MRN: [id number] HPI F former smoker followed for asthma and allergic rhinitis, Nocturnal Hypoxemia, , complicated by GERD, depression, VCD, seizure disorder PFT 07/05/08- FVC 3.51/ 88%, FEV1 2.38/ 78%, R 0.68, 25-75% 1.45/ 44%, TLC 83%, DLCO 68% Allergy Profile 01/18/2013-total IgE 242.4 with broad elevations for common inhalant allergens. -----------------------------------------------------------------------------------   12/22/2018- 57 yoF  former smoker followed for asthma and allergic rhinitis, complicated by GERD, depression, VCD, seizure disorder O2 2L sleep/ [employer] Advair 500, ProAir hfa, Epipen, ------last seen in 2015, pt states breathing has not been stable, having lots of wheezing  She tells me she had done very well fora long time, out of her inhaled meds. Then she cleaned dusty closets using Lysol spray and had flare for which she called requesting meds till this appointment. Now feels almost back to baseline. Asthma not disturbing sleep.  Has slept w O2 2L for years. Aware of seasonal allergy triggers- pollens.Denies heart problems, edema, palpitation, fever.  Activity limited by herniated disk- rolling walker. CXR 01/09/18- Lungs are clear. Heart size and pulmonary vascularity are normal. No adenopathy. No bone lesions. IMPRESSION: No edema or consolidation.  07/10/20- 57 yoF  former smoker followed for Asthma and allergic rhinitis, Nocturnal Hypoxemia/ Chronic Resp Fail Hypoxia, complicated by GERD, depression, VCD, seizure disorder NPSG 05/01/09- AHI 3.3/ hr, RDI 70/ hr, desat to 88%, boy weight 245 lbs O2 2L sleep/ [employer] Advair 500, ProAir hfa, Epipen PFT  10/05/09-   Mod obstruction, mild restriction, slight resp to BD mid-flows, Moderate reduction DLCO  FEV1 48%,, F/F 0.75 ONOX - not done ED 07/09/20- Fall w displaced ankle fx.>>? Covid vax- Flu vax-    ROS-see HPI  + =  positive Constitutional:   No-   weight loss, night sweats, fevers, chills, fatigue, lassitude. HEENT:   No-  headaches, difficulty swallowing, tooth/dental problems, sore throat,       +sneezing, itching, ear ache, nasal congestion, post nasal drip,  CV:  No-   chest pain, orthopnea, PND, swelling in lower extremities, anasarca, dizziness, palpitations Resp: No-   shortness of breath with exertion or at rest.              No-   productive cough,  No non-productive cough,  No- coughing up of blood.              No-   change in color of mucus.  No- wheezing now.   Skin: + rash per HPI GI:  No-   heartburn, indigestion, abdominal pain, nausea, vomiting,  GU:  MS:  No-   joint pain or swelling.   Neuro-     nothing unusual Psych:  No- change in mood or affect. No depression or anxiety.  No memory loss.  Objective:  OBJ- Physical Exam General- Alert, Oriented, Affect-appropriate, Distress- none acute. + obese Skin- rash-none, lesions- none, excoriation- none Lymphadenopathy- none Head- atraumatic            Eyes- Gross vision intact, PERRLA, conjunctivae and secretions clear            Ears- Hearing, canals-normal            Nose- Clear, no-Septal dev, mucus, polyps, erosion, perforation             Throat- Mallampati II-III , mucosa clear , drainage- none, tonsils- atrophic Neck- flexible , trachea midline, no stridor , thyroid nl, carotid no  bruit Chest - symmetrical excursion , unlabored           Heart/CV- RRR , no murmur , no gallop  , no rub, nl s1 s2                           - JVD- none , edema- none, stasis changes- none, varices- none           Lung- clear to P&A, wheeze- none, cough- none , dullness-none, rub- none           Chest wall-  Abd-  Br/ Gen/ Rectal- Not done, not indicated Extrem- cyanosis- none, clubbing, none, atrophy- none, strength- nl.  Neuro- grossly intact to observation  Assessment & Plan:

## 2020-07-09 NOTE — ED Notes (Signed)
Patient is being discharged from the Urgent Care and sent to the Emergency Department via wheelchair by family member . Per Dr Delton See, patient is in need of higher level of care due to displaced fracture & possible injury. Patient is aware and verbalizes understanding of plan of care. There were no vitals filed for this visit.

## 2020-07-09 NOTE — ED Triage Notes (Signed)
Per EMS, fell at home-history of MS-fell ambulating out of BR, tripped over feet-complaining left should and left ankle pain

## 2020-07-09 NOTE — ED Triage Notes (Signed)
Pt states she got dizzy and fell this morning, no LOC. She c/o left ankle and left shoulder pain. She was seen at [facility] and UC for the same, has a displaced left ankle fracture.

## 2020-07-09 NOTE — ED Notes (Signed)
Patient daughter, Meriam Sprague, would like an update once one is available, [phone].

## 2020-07-10 ENCOUNTER — Ambulatory Visit: Payer: Medicare HMO | Admitting: Internal Medicine

## 2020-07-10 DIAGNOSIS — M25572 Pain in left ankle and joints of left foot: Secondary | ICD-10-CM | POA: Diagnosis not present

## 2020-07-10 NOTE — ED Notes (Signed)
Pt called for vitals no answer. °

## 2020-07-11 NOTE — H&P (Signed)
HPI:  Pt. suffered from fall 07/08/20 and presented to the ER with complaint of left leg pain. Subsequent w/u showed a right fibula fracture. Pt. Was sent home and told to follow up with [place] here in the office. Pt. States that pain is currently 7/10, sharp, localized, and she denies paraesthesia.   Past Medical History:  Diagnosis Date  . Anemia   . Arthritis   . Arthropathy, unspecified, site unspecified   . Asthma   . Backache, unspecified   . Bipolar I disorder 06/13/2008   Qualifier: Diagnosis of  By: Excell Seltzer CMA, Lawson Fiscal    . Dysphagia, pharyngoesophageal phase 09/12/2008   Qualifier: Diagnosis of  By: Delford Field MD, Charlcie Cradle   . Fibromyalgia   . Generalized anxiety disorder 04/05/2012  . GERD (gastroesophageal reflux disease) 07/05/2008   Qualifier: Diagnosis of  By: Delford Field MD, Charlcie Cradle   . Heart disease 12/19/2018  . Hypertension   . Intractable migraine with aura 11/03/2013  . Lumbar radiculopathy 08/30/2012  . Major depressive disorder 09/08/2019  . Major depressive disorder, recurrent, severe with psychotic features 04/05/2012  . Mild neurocognitive disorder, unclear etiology 09/08/2019  . Multiple sclerosis 11/03/2013   remission at present(past hx. bedridden) -ambulates with cane-left side weaker  . Obstructive sleep apnea 06/13/2008   no cpap-uses Oxygen nightly 2l/m   . On home oxygen therapy    nightly at 2l/m  . Other abnormal glucose    05-19-13 no problems now  . Seizure disorder    last seizure 2014  . Thyroid disease 10/03/2013     Past Surgical History:  Procedure Laterality Date  . APPENDECTOMY    . BREAST LUMPECTOMY WITH NEEDLE LOCALIZATION Left 06/06/2015   Procedure: LEFT BREAST LUMPECTOMY WITH NEEDLE LOCALIZATION TIMES TWO;  Surgeon: Harriette Bouillon, MD;  Location: [facility];  Service: General;  Laterality: Left;  . BREAST SURGERY    . CESAREAN SECTION  1988   twins  . CHOLECYSTECTOMY    . COLONOSCOPY N/A 06/12/2013   Procedure: COLONOSCOPY;   Surgeon: Hilarie Fredrickson, MD;  Location: [facility];  Service: Endoscopy;  Laterality: N/A;  . HERNIA REPAIR     umbilical  . TUBAL LIGATION    . VAGINAL HYSTERECTOMY       Family History  Problem Relation Age of Onset  . Heart disease Father   . Ovarian cancer Mother   . Breast cancer Mother   . Alzheimer's disease Sister 64  . Colon cancer Neg Hx   . Esophageal cancer Neg Hx   . Stomach cancer Neg Hx   . Rectal cancer Neg Hx      Current Medications No current facility-administered medications for this encounter.   Marland Kitchen albuterol (PROAIR HFA) 108 (90 Base) MCG/ACT inhaler  . benztropine (COGENTIN) 0.5 MG tablet  . EPINEPHrine (EPIPEN 2-PAK) 0.3 mg/0.3 mL IJ SOAJ injection  . famotidine (PEPCID) 20 MG tablet  . FLUoxetine (PROZAC) 40 MG capsule  . Fluticasone-Salmeterol (ADVAIR) 500-50 MCG/DOSE AEPB  . hydrOXYzine (ATARAX/VISTARIL) 25 MG tablet  . ibuprofen (ADVIL) 600 MG tablet  . lamoTRIgine (LAMICTAL) 200 MG tablet  . metoprolol succinate (TOPROL-XL) 50 MG 24 hr tablet  . mirtazapine (REMERON) 15 MG tablet  . Multiple Vitamin (MULTIVITAMIN WITH MINERALS) TABS  . polyethylene glycol (MIRALAX / GLYCOLAX) packet  . tiZANidine (ZANAFLEX) 4 MG tablet  . VITAMIN D PO  . Vitamin D, Ergocalciferol, (DRISDOL) 1.25 MG (50000 UNIT) CAPS capsule  . ziprasidone (GEODON) 20 MG capsule  Allergies Bee venom, Depakote [divalproex sodium], Haloperidol lactate, Hornet venom, Latex, Peanut-containing drug products, Penicillins, Quetiapine, Shellfish-derived products, Valproic acid, Amitriptyline hcl, Butorphanol tartrate, Gabapentin, Iodine, Pregabalin, and Terbutaline  Social History  reports that she quit smoking about 13 years ago. She has a 5.00 pack-year smoking history. She has never used smokeless tobacco. She reports that she does not drink alcohol and does not use drugs.    Physical Exam General: Well appearing, in NAD Mental status: Alert and Oriented x3 Lungs: CTA b/l  anterior and posterior without crackles or wheeze Heart: RRR, no m/g/r appreciated Abdomen: +BS, soft, nt, nd, no masses, hernias, or organomegaly appreciated Neurological: Speech Clear and organized, no gross focal findings or movement disorder appreciated Musculoskeletal: LLE: swelling noted around the ankle. No gross deformity noted. Nvi. no gross joint deformity or swelling.  Observed gait normal Extremities: Warm and well perfused w/o edema Skin: Warm and dry   XR of left ankle Moderately displaced distal left fibular fracture.      A/P Left fibular fracture, bimalleolar equivalent.   Discussed options with the pt. Risks, benefits, alternatives for ORIF of the left ankle fracture were presented. All questions answered. She has elected to proceed with surgical fixation

## 2020-07-13 ENCOUNTER — Other Ambulatory Visit (HOSPITAL_COMMUNITY)
Admission: RE | Admit: 2020-07-13 | Discharge: 2020-07-13 | Disposition: A | Discharge status: Home / Self Care | Payer: Medicare HMO | Source: Ambulatory Visit | Attending: Orthopedic Surgery | Admitting: Orthopedic Surgery

## 2020-07-13 DIAGNOSIS — Z01812 Encounter for preprocedural laboratory examination: Secondary | ICD-10-CM | POA: Insufficient documentation

## 2020-07-13 DIAGNOSIS — Z20822 Contact with and (suspected) exposure to covid-19: Secondary | ICD-10-CM | POA: Insufficient documentation

## 2020-07-14 LAB — SARS CORONAVIRUS 2 (TAT 6-24 HRS): SARS Coronavirus 2: NEGATIVE

## 2020-07-15 ENCOUNTER — Other Ambulatory Visit: Payer: Self-pay

## 2020-07-15 ENCOUNTER — Encounter (HOSPITAL_BASED_OUTPATIENT_CLINIC_OR_DEPARTMENT_OTHER): Payer: Self-pay | Admitting: Orthopedic Surgery

## 2020-07-15 NOTE — Progress Notes (Addendum)
Spoke w/ via phone for pre-op interview---patient Lab needs dos----      I stat          COVID test ------07-13-2020 1000 am Arrive at -------630 am 07-16-2020 NPO after MN NO Solid Food.  Clear liquids from MN until---530 am then npo Medications to take morning of surgery -----albuterol inhaler prn/bring inhaler, advair, hydrozyazine, fluoxetine, cogentin, ziprasidone, metoprolol succinate, famotidine Diabetic medication -----n/a Patient Special Instructions -----none Pre-Op special Istructions -----none Patient verbalized understanding of instructions that were given at this phone interview. Patient denies shortness of breath, chest pain, fever, cough at this phone interview.  Anesthesia Review: Chart reviewed by Konrad Felix pa on 07-10-2020 and patient meets [facility] guidelines  PCP: dr Larene Beach banks Cardiologist :none Chest x-ray : 10-18--2021 epic EKG :07-09-2020 epic Echo :none Stress test:none Cardiac Cath : none lov neurology dr Hazle Coca 09-14-2019 epic lov pulmonary tammy perrett  np10-26-2021 epic Pulmonary function test 04-30-2020 epic Activity level: uses walker needs wheelchair for distance Sleep Study/ CPAP : no cpap used uses oxygen 2 lietrs at hs prn Fasting Blood Sugar :      / Checks Blood Sugar -- times a day:  n/a Blood Thinner/ Instructions /Last Dose:n/a ASA / Instructions/ Last Dose : n/a  Patient used walker will call main desk for wheelchair when arrives

## 2020-07-15 NOTE — Anesthesia Preprocedure Evaluation (Addendum)
Anesthesia Evaluation  Patient identified by MRN, date of birth, ID band Patient awake    Reviewed: Allergy & Precautions, NPO status , Patient's Chart, lab work & pertinent test results  Airway Mallampati: III  TM Distance: >3 FB Neck ROM: Full    Dental  (+) Missing, Poor Dentition   Pulmonary asthma , sleep apnea , former smoker,    Pulmonary exam normal breath sounds clear to auscultation       Cardiovascular hypertension, Pt. on home beta blockers Normal cardiovascular exam Rhythm:Regular Rate:Normal     Neuro/Psych  Headaches, Seizures -, Well Controlled,  PSYCHIATRIC DISORDERS Anxiety Depression Bipolar Disorder Schizophrenia MS    GI/Hepatic Neg liver ROS, hiatal hernia, GERD  Medicated,  Endo/Other  Morbid obesity  Renal/GU negative Renal ROS     Musculoskeletal  (+) Arthritis , Fibromyalgia -Motor and sensation intact to left foot   Abdominal (+) + obese,   Peds  Hematology negative hematology ROS (+)   Anesthesia Other Findings LEFT ANKLE FRACTURE  Reproductive/Obstetrics                           Anesthesia Physical Anesthesia Plan  ASA: IV  Anesthesia Plan: General and Regional   Post-op Pain Management: GA combined w/ Regional for post-op pain   Induction: Intravenous  PONV Risk Score and Plan: 3 and Ondansetron, Dexamethasone, Midazolam and Treatment may vary due to age or medical condition  Airway Management Planned: LMA  Additional Equipment:   Intra-op Plan:   Post-operative Plan: Extubation in OR  Informed Consent: I have reviewed the patients History and Physical, chart, labs and discussed the procedure including the risks, benefits and alternatives for the proposed anesthesia with the patient or authorized representative who has indicated his/her understanding and acceptance.     Dental advisory given  Plan Discussed with: CRNA  Anesthesia Plan  Comments:        Anesthesia Quick Evaluation

## 2020-07-16 ENCOUNTER — Ambulatory Visit: Payer: Medicare HMO

## 2020-07-16 ENCOUNTER — Encounter (HOSPITAL_COMMUNITY)
Admission: RE | Disposition: A | Discharge status: Home / Self Care | Payer: Self-pay | Source: Home / Self Care | Attending: Orthopedic Surgery

## 2020-07-16 ENCOUNTER — Encounter (HOSPITAL_BASED_OUTPATIENT_CLINIC_OR_DEPARTMENT_OTHER): Payer: Self-pay | Admitting: Orthopedic Surgery

## 2020-07-16 ENCOUNTER — Ambulatory Visit (HOSPITAL_BASED_OUTPATIENT_CLINIC_OR_DEPARTMENT_OTHER): Payer: Medicare HMO | Admitting: Physician Assistant

## 2020-07-16 ENCOUNTER — Observation Stay (HOSPITAL_BASED_OUTPATIENT_CLINIC_OR_DEPARTMENT_OTHER)
Admission: RE | Admit: 2020-07-16 | Discharge: 2020-07-18 | Disposition: A | Discharge status: Home / Self Care | Payer: Medicare HMO | Attending: Orthopedic Surgery | Admitting: Orthopedic Surgery

## 2020-07-16 ENCOUNTER — Other Ambulatory Visit: Payer: Self-pay

## 2020-07-16 DIAGNOSIS — Z9981 Dependence on supplemental oxygen: Secondary | ICD-10-CM | POA: Diagnosis not present

## 2020-07-16 DIAGNOSIS — S82842A Displaced bimalleolar fracture of left lower leg, initial encounter for closed fracture: Secondary | ICD-10-CM | POA: Diagnosis not present

## 2020-07-16 DIAGNOSIS — X58XXXA Exposure to other specified factors, initial encounter: Secondary | ICD-10-CM | POA: Diagnosis not present

## 2020-07-16 DIAGNOSIS — J45909 Unspecified asthma, uncomplicated: Secondary | ICD-10-CM | POA: Insufficient documentation

## 2020-07-16 DIAGNOSIS — Z79899 Other long term (current) drug therapy: Secondary | ICD-10-CM | POA: Diagnosis not present

## 2020-07-16 DIAGNOSIS — S8262XA Displaced fracture of lateral malleolus of left fibula, initial encounter for closed fracture: Principal | ICD-10-CM | POA: Insufficient documentation

## 2020-07-16 DIAGNOSIS — I10 Essential (primary) hypertension: Secondary | ICD-10-CM | POA: Insufficient documentation

## 2020-07-16 DIAGNOSIS — G4733 Obstructive sleep apnea (adult) (pediatric): Secondary | ICD-10-CM | POA: Insufficient documentation

## 2020-07-16 DIAGNOSIS — Z87891 Personal history of nicotine dependence: Secondary | ICD-10-CM | POA: Insufficient documentation

## 2020-07-16 DIAGNOSIS — S82892A Other fracture of left lower leg, initial encounter for closed fracture: Secondary | ICD-10-CM | POA: Diagnosis not present

## 2020-07-16 DIAGNOSIS — R69 Illness, unspecified: Secondary | ICD-10-CM | POA: Diagnosis not present

## 2020-07-16 DIAGNOSIS — S82892B Other fracture of left lower leg, initial encounter for open fracture type I or II: Secondary | ICD-10-CM

## 2020-07-16 DIAGNOSIS — F319 Bipolar disorder, unspecified: Secondary | ICD-10-CM | POA: Diagnosis not present

## 2020-07-16 DIAGNOSIS — E559 Vitamin D deficiency, unspecified: Secondary | ICD-10-CM | POA: Diagnosis not present

## 2020-07-16 DIAGNOSIS — E782 Mixed hyperlipidemia: Secondary | ICD-10-CM | POA: Diagnosis not present

## 2020-07-16 DIAGNOSIS — I519 Heart disease, unspecified: Secondary | ICD-10-CM | POA: Diagnosis not present

## 2020-07-16 DIAGNOSIS — G8918 Other acute postprocedural pain: Secondary | ICD-10-CM | POA: Diagnosis not present

## 2020-07-16 HISTORY — DX: Other fracture of left lower leg, initial encounter for open fracture type I or II: S82.892B

## 2020-07-16 HISTORY — DX: Other fracture of left lower leg, initial encounter for closed fracture: S82.892A

## 2020-07-16 HISTORY — PX: ORIF ANKLE FRACTURE: SHX5408

## 2020-07-16 HISTORY — DX: Other specified postprocedural states: Z98.890

## 2020-07-16 HISTORY — DX: Other specified postprocedural states: R11.2

## 2020-07-16 LAB — POCT I-STAT, CHEM 8
BUN: 15 mg/dL (ref 6–20)
Calcium, Ion: 0.95 mmol/L — ABNORMAL LOW (ref 1.15–1.40)
Chloride: 107 mmol/L (ref 98–111)
Creatinine, Ser: 0.7 mg/dL (ref 0.44–1.00)
Glucose, Bld: 133 mg/dL — ABNORMAL HIGH (ref 70–99)
HCT: 38 % (ref 36.0–46.0)
Hemoglobin: 12.9 g/dL (ref 12.0–15.0)
Potassium: 4.3 mmol/L (ref 3.5–5.1)
Sodium: 140 mmol/L (ref 135–145)
TCO2: 25 mmol/L (ref 22–32)

## 2020-07-16 SURGERY — OPEN REDUCTION INTERNAL FIXATION (ORIF) ANKLE FRACTURE
Anesthesia: Regional | Site: Ankle | Laterality: Left

## 2020-07-16 MED ORDER — FENTANYL CITRATE (PF) 100 MCG/2ML IJ SOLN: 25.0000 ug | INTRAMUSCULAR | Status: DC | PRN

## 2020-07-16 MED ORDER — METOPROLOL TARTRATE 5 MG/5ML IV SOLN: 5.0000 mg | Freq: Four times a day (QID) | INTRAVENOUS | Status: DC | PRN

## 2020-07-16 MED ORDER — HYDROCODONE-ACETAMINOPHEN 5-325 MG PO TABS: 1.0000 | ORAL_TABLET | ORAL | Status: DC | PRN

## 2020-07-16 MED ORDER — MIDAZOLAM HCL 5 MG/5ML IJ SOLN
INTRAMUSCULAR | Status: DC | PRN
  Administered 2020-07-16: 2 mg via INTRAVENOUS

## 2020-07-16 MED ORDER — METOPROLOL TARTRATE 5 MG/5ML IV SOLN
5.0000 mg | Freq: Four times a day (QID) | INTRAVENOUS | Status: DC | PRN
  Administered 2020-07-16: 5 mg via INTRAVENOUS
  Filled 2020-07-16: qty 5

## 2020-07-16 MED ORDER — MORPHINE SULFATE (PF) 4 MG/ML IV SOLN
0.5000 mg | INTRAVENOUS | Status: DC | PRN
  Administered 2020-07-16: 1 mg via INTRAVENOUS

## 2020-07-16 MED ORDER — PROPOFOL 10 MG/ML IV BOLUS
INTRAVENOUS | Status: DC | PRN
  Administered 2020-07-16: 200 mg via INTRAVENOUS

## 2020-07-16 MED ORDER — DOCUSATE SODIUM 100 MG PO CAPS
ORAL_CAPSULE | ORAL | Status: AC
  Filled 2020-07-16: qty 1

## 2020-07-16 MED ORDER — HYDROCODONE-ACETAMINOPHEN 7.5-325 MG PO TABS
ORAL_TABLET | ORAL | Status: AC
  Filled 2020-07-16: qty 1

## 2020-07-16 MED ORDER — FENTANYL CITRATE (PF) 100 MCG/2ML IJ SOLN
INTRAMUSCULAR | Status: AC
  Filled 2020-07-16: qty 2

## 2020-07-16 MED ORDER — VANCOMYCIN HCL IN DEXTROSE 1-5 GM/200ML-% IV SOLN
1000.0000 mg | Freq: Two times a day (BID) | INTRAVENOUS | Status: DC
Start: 2020-07-16 — End: 2020-07-16

## 2020-07-16 MED ORDER — ACETAMINOPHEN 325 MG PO TABS: 325.0000 mg | ORAL_TABLET | Freq: Four times a day (QID) | ORAL | Status: DC | PRN

## 2020-07-16 MED ORDER — ONDANSETRON HCL 4 MG PO TABS: 4.0000 mg | ORAL_TABLET | Freq: Every day | ORAL | 1 refills | Status: AC | PRN

## 2020-07-16 MED ORDER — ONDANSETRON HCL 4 MG PO TABS: 4.0000 mg | ORAL_TABLET | Freq: Four times a day (QID) | ORAL | Status: DC | PRN

## 2020-07-16 MED ORDER — METHOCARBAMOL 500 MG PO TABS
ORAL_TABLET | ORAL | Status: AC
  Filled 2020-07-16: qty 1

## 2020-07-16 MED ORDER — ACETAMINOPHEN 10 MG/ML IV SOLN: 1000.0000 mg | Freq: Once | INTRAVENOUS | Status: DC | PRN

## 2020-07-16 MED ORDER — CELECOXIB 200 MG PO CAPS
ORAL_CAPSULE | ORAL | Status: AC
  Filled 2020-07-16: qty 1

## 2020-07-16 MED ORDER — SENNOSIDES-DOCUSATE SODIUM 8.6-50 MG PO TABS: 1.0000 | ORAL_TABLET | Freq: Every evening | ORAL | Status: DC | PRN

## 2020-07-16 MED ORDER — HYDROCODONE-ACETAMINOPHEN 7.5-325 MG PO TABS
1.0000 | ORAL_TABLET | ORAL | Status: DC | PRN
  Administered 2020-07-16: 1 via ORAL

## 2020-07-16 MED ORDER — METHOCARBAMOL 1000 MG/10ML IJ SOLN
500.0000 mg | Freq: Four times a day (QID) | INTRAVENOUS | Status: DC | PRN
  Filled 2020-07-16: qty 5

## 2020-07-16 MED ORDER — VANCOMYCIN HCL 1500 MG/300ML IV SOLN
1500.0000 mg | INTRAVENOUS | Status: AC
  Administered 2020-07-16: 1500 mg via INTRAVENOUS
  Filled 2020-07-16: qty 300

## 2020-07-16 MED ORDER — MORPHINE SULFATE (PF) 4 MG/ML IV SOLN
0.5000 mg | INTRAVENOUS | Status: DC | PRN
  Administered 2020-07-17: 10:00:00 1 mg via INTRAVENOUS
  Filled 2020-07-16: qty 1

## 2020-07-16 MED ORDER — KETOROLAC TROMETHAMINE 30 MG/ML IJ SOLN
30.0000 mg | Freq: Once | INTRAMUSCULAR | Status: AC
  Administered 2020-07-16: 30 mg via INTRAVENOUS

## 2020-07-16 MED ORDER — METHOCARBAMOL 500 MG PO TABS
500.0000 mg | ORAL_TABLET | Freq: Four times a day (QID) | ORAL | Status: DC | PRN
  Administered 2020-07-16 – 2020-07-17 (×3): 500 mg via ORAL
  Filled 2020-07-16 (×2): qty 1

## 2020-07-16 MED ORDER — ASPIRIN EC 81 MG PO TBEC
81.0000 mg | DELAYED_RELEASE_TABLET | Freq: Two times a day (BID) | ORAL | Status: DC
Start: 2020-07-16 — End: 2020-07-18
  Administered 2020-07-16 – 2020-07-18 (×5): 81 mg via ORAL
  Filled 2020-07-16 (×4): qty 1

## 2020-07-16 MED ORDER — ACETAMINOPHEN 500 MG PO TABS
1000.0000 mg | ORAL_TABLET | Freq: Once | ORAL | Status: AC
  Administered 2020-07-16: 1000 mg via ORAL

## 2020-07-16 MED ORDER — LACTATED RINGERS IV SOLN: INTRAVENOUS | Status: DC

## 2020-07-16 MED ORDER — OXYCODONE HCL 5 MG/5ML PO SOLN: 5.0000 mg | Freq: Once | ORAL | Status: AC | PRN

## 2020-07-16 MED ORDER — PROMETHAZINE HCL 25 MG/ML IJ SOLN: 6.2500 mg | INTRAMUSCULAR | Status: DC | PRN

## 2020-07-16 MED ORDER — SENNOSIDES-DOCUSATE SODIUM 8.6-50 MG PO TABS
1.0000 | ORAL_TABLET | Freq: Every evening | ORAL | Status: DC | PRN
  Filled 2020-07-16: qty 1

## 2020-07-16 MED ORDER — DEXAMETHASONE SODIUM PHOSPHATE 10 MG/ML IJ SOLN
INTRAMUSCULAR | Status: DC | PRN
  Administered 2020-07-16: 10 mg via INTRAVENOUS

## 2020-07-16 MED ORDER — BISACODYL 10 MG RE SUPP: 10.0000 mg | Freq: Every day | RECTAL | Status: DC | PRN

## 2020-07-16 MED ORDER — FENTANYL CITRATE (PF) 250 MCG/5ML IJ SOLN
INTRAMUSCULAR | Status: DC | PRN
  Administered 2020-07-16 (×4): 25 ug via INTRAVENOUS

## 2020-07-16 MED ORDER — ASPIRIN 81 MG PO CHEW
CHEWABLE_TABLET | ORAL | Status: AC
  Filled 2020-07-16: qty 1

## 2020-07-16 MED ORDER — FENTANYL CITRATE (PF) 100 MCG/2ML IJ SOLN
50.0000 ug | Freq: Once | INTRAMUSCULAR | Status: AC
  Administered 2020-07-16: 50 ug via INTRAVENOUS

## 2020-07-16 MED ORDER — ASPIRIN EC 81 MG PO TBEC: 81.0000 mg | DELAYED_RELEASE_TABLET | Freq: Two times a day (BID) | ORAL | 0 refills | Status: AC

## 2020-07-16 MED ORDER — CELECOXIB 200 MG PO CAPS: 200.0000 mg | ORAL_CAPSULE | Freq: Two times a day (BID) | ORAL | Status: DC

## 2020-07-16 MED ORDER — BUPIVACAINE-EPINEPHRINE (PF) 0.5% -1:200000 IJ SOLN
INTRAMUSCULAR | Status: DC | PRN
  Administered 2020-07-16: 30 mL via PERINEURAL

## 2020-07-16 MED ORDER — OXYCODONE HCL 5 MG PO TABS
5.0000 mg | ORAL_TABLET | Freq: Once | ORAL | Status: AC | PRN
  Administered 2020-07-16: 5 mg via ORAL

## 2020-07-16 MED ORDER — MIDAZOLAM HCL 2 MG/2ML IJ SOLN
INTRAMUSCULAR | Status: AC
  Filled 2020-07-16: qty 2

## 2020-07-16 MED ORDER — DEXAMETHASONE SODIUM PHOSPHATE 10 MG/ML IJ SOLN
INTRAMUSCULAR | Status: AC
  Filled 2020-07-16: qty 1

## 2020-07-16 MED ORDER — VANCOMYCIN HCL IN DEXTROSE 1-5 GM/200ML-% IV SOLN
1000.0000 mg | Freq: Two times a day (BID) | INTRAVENOUS | Status: AC
Start: 2020-07-16 — End: 2020-07-17
  Administered 2020-07-16: 20:00:00 1000 mg via INTRAVENOUS
  Filled 2020-07-16: qty 200

## 2020-07-16 MED ORDER — METOCLOPRAMIDE HCL 5 MG PO TABS
5.0000 mg | ORAL_TABLET | Freq: Three times a day (TID) | ORAL | Status: DC | PRN
  Filled 2020-07-16: qty 2

## 2020-07-16 MED ORDER — HYDROCODONE-ACETAMINOPHEN 5-325 MG PO TABS: 1.0000 | ORAL_TABLET | ORAL | 0 refills | Status: AC | PRN

## 2020-07-16 MED ORDER — ONDANSETRON HCL 4 MG/2ML IJ SOLN: 4.0000 mg | Freq: Four times a day (QID) | INTRAMUSCULAR | Status: DC | PRN

## 2020-07-16 MED ORDER — ACETAMINOPHEN 500 MG PO TABS
ORAL_TABLET | ORAL | Status: AC
  Filled 2020-07-16: qty 2

## 2020-07-16 MED ORDER — PROPOFOL 10 MG/ML IV BOLUS
INTRAVENOUS | Status: AC
  Filled 2020-07-16: qty 20

## 2020-07-16 MED ORDER — MORPHINE SULFATE (PF) 2 MG/ML IV SOLN
INTRAVENOUS | Status: AC
  Filled 2020-07-16: qty 1

## 2020-07-16 MED ORDER — LIDOCAINE HCL (PF) 2 % IJ SOLN
INTRAMUSCULAR | Status: AC
  Filled 2020-07-16: qty 5

## 2020-07-16 MED ORDER — VANCOMYCIN HCL IN DEXTROSE 1-5 GM/200ML-% IV SOLN
1000.0000 mg | Freq: Two times a day (BID) | INTRAVENOUS | Status: DC
  Filled 2020-07-16: qty 200

## 2020-07-16 MED ORDER — CELECOXIB 200 MG PO CAPS
200.0000 mg | ORAL_CAPSULE | Freq: Two times a day (BID) | ORAL | Status: DC
  Administered 2020-07-16 – 2020-07-18 (×5): 200 mg via ORAL
  Filled 2020-07-16 (×4): qty 1

## 2020-07-16 MED ORDER — KETOROLAC TROMETHAMINE 30 MG/ML IJ SOLN
INTRAMUSCULAR | Status: AC
  Filled 2020-07-16: qty 1

## 2020-07-16 MED ORDER — LIDOCAINE 2% (20 MG/ML) 5 ML SYRINGE
INTRAMUSCULAR | Status: DC | PRN
  Administered 2020-07-16: 40 mg via INTRAVENOUS

## 2020-07-16 MED ORDER — METOCLOPRAMIDE HCL 5 MG/ML IJ SOLN: 5.0000 mg | Freq: Three times a day (TID) | INTRAMUSCULAR | Status: DC | PRN

## 2020-07-16 MED ORDER — OXYCODONE HCL 5 MG PO TABS
ORAL_TABLET | ORAL | Status: AC
  Filled 2020-07-16: qty 1

## 2020-07-16 MED ORDER — HYDROCODONE-ACETAMINOPHEN 7.5-325 MG PO TABS
1.0000 | ORAL_TABLET | ORAL | Status: DC | PRN
  Administered 2020-07-16 – 2020-07-18 (×6): 2 via ORAL
  Filled 2020-07-16 (×6): qty 2

## 2020-07-16 MED ORDER — MIDAZOLAM HCL 2 MG/2ML IJ SOLN
2.0000 mg | Freq: Once | INTRAMUSCULAR | Status: AC
  Administered 2020-07-16: 2 mg via INTRAVENOUS

## 2020-07-16 MED ORDER — ONDANSETRON HCL 4 MG/2ML IJ SOLN
INTRAMUSCULAR | Status: DC | PRN
  Administered 2020-07-16: 4 mg via INTRAVENOUS

## 2020-07-16 MED ORDER — DOCUSATE SODIUM 100 MG PO CAPS
100.0000 mg | ORAL_CAPSULE | Freq: Two times a day (BID) | ORAL | Status: DC
  Administered 2020-07-16 – 2020-07-18 (×5): 100 mg via ORAL
  Filled 2020-07-16 (×4): qty 1

## 2020-07-16 SURGICAL SUPPLY — 75 items
ANCHOR BUTTON TIGHTROPE ACL RT (Orthopedic Implant) IMPLANT
APL PRP STRL LF DISP 70% ISPRP (MISCELLANEOUS) ×1
BANDAGE ESMARK 6X9 LF (GAUZE/BANDAGES/DRESSINGS) ×1 IMPLANT
BIT DRILL 2.5X125 (BIT) ×1 IMPLANT
BIT DRILL 3.5X125 (BIT) IMPLANT
BLADE SURG 15 STRL LF DISP TIS (BLADE) ×2 IMPLANT
BLADE SURG 15 STRL SS (BLADE) ×4
BNDG CMPR 9X6 STRL LF SNTH (GAUZE/BANDAGES/DRESSINGS) ×1
BNDG COHESIVE 4X5 TAN STRL (GAUZE/BANDAGES/DRESSINGS) ×2 IMPLANT
BNDG ELASTIC 4X5.8 VLCR STR LF (GAUZE/BANDAGES/DRESSINGS) ×2 IMPLANT
BNDG ELASTIC 6X5.8 VLCR STR LF (GAUZE/BANDAGES/DRESSINGS) ×2 IMPLANT
BNDG ESMARK 6X9 LF (GAUZE/BANDAGES/DRESSINGS) ×2
BNDG GAUZE ELAST 4 BULKY (GAUZE/BANDAGES/DRESSINGS) ×2 IMPLANT
CHLORAPREP W/TINT 26 (MISCELLANEOUS) ×2 IMPLANT
CLSR STERI-STRIP ANTIMIC 1/2X4 (GAUZE/BANDAGES/DRESSINGS) ×2 IMPLANT
COVER BACK TABLE 60X90IN (DRAPES) ×2 IMPLANT
COVER MAYO STAND STRL (DRAPES) ×2 IMPLANT
COVER WAND RF STERILE (DRAPES) ×2 IMPLANT
CUFF TOURN SGL QUICK 34 (TOURNIQUET CUFF) ×2
CUFF TRNQT CYL 34X4.125X (TOURNIQUET CUFF) IMPLANT
DRAPE C-ARM 35X43 STRL (DRAPES) ×2 IMPLANT
DRAPE EXTREMITY T 121X128X90 (DISPOSABLE) ×2 IMPLANT
DRAPE IMP U-DRAPE 54X76 (DRAPES) ×2 IMPLANT
DRAPE U-SHAPE 47X51 STRL (DRAPES) ×2 IMPLANT
DRILL BIT 3.5X125 (BIT) ×2
DRSG EMULSION OIL 3X3 NADH (GAUZE/BANDAGES/DRESSINGS) ×2 IMPLANT
DRSG PAD ABDOMINAL 8X10 ST (GAUZE/BANDAGES/DRESSINGS) ×2 IMPLANT
ELECT REM PT RETURN 9FT ADLT (ELECTROSURGICAL) ×2
ELECTRODE REM PT RTRN 9FT ADLT (ELECTROSURGICAL) ×1 IMPLANT
GAUZE SPONGE 4X4 12PLY STRL (GAUZE/BANDAGES/DRESSINGS) ×2 IMPLANT
GLOVE SRG 8 PF TXTR STRL LF DI (GLOVE) ×2 IMPLANT
GLOVE SURG SS PI 7.5 STRL IVOR (GLOVE) ×3 IMPLANT
GLOVE SURG UNDER POLY LF SZ7 (GLOVE) ×1 IMPLANT
GLOVE SURG UNDER POLY LF SZ8 (GLOVE) ×4
GOWN STRL REUS W/ TWL LRG LVL3 (GOWN DISPOSABLE) ×2 IMPLANT
GOWN STRL REUS W/ TWL XL LVL3 (GOWN DISPOSABLE) ×1 IMPLANT
GOWN STRL REUS W/TWL LRG LVL3 (GOWN DISPOSABLE) ×4
GOWN STRL REUS W/TWL XL LVL3 (GOWN DISPOSABLE) ×2
IMPL TIGHTROP W/DRV K-LESS (Anchor) IMPLANT
IMPLANT TIGHTROPE W/DRV K-LESS (Anchor) ×2 IMPLANT
KIT TURNOVER CYSTO (KITS) ×2 IMPLANT
NEEDLE HYPO 22GX1.5 SAFETY (NEEDLE) ×1 IMPLANT
NS IRRIG 1000ML POUR BTL (IV SOLUTION) ×2 IMPLANT
PACK BASIN DAY SURGERY FS (CUSTOM PROCEDURE TRAY) ×2 IMPLANT
PAD CAST 4YDX4 CTTN HI CHSV (CAST SUPPLIES) ×1 IMPLANT
PADDING CAST ABS 4INX4YD NS (CAST SUPPLIES) ×1
PADDING CAST ABS COTTON 4X4 ST (CAST SUPPLIES) ×2 IMPLANT
PADDING CAST COTTON 4X4 STRL (CAST SUPPLIES) ×2
PADDING CAST COTTON 6X4 STRL (CAST SUPPLIES) ×2 IMPLANT
PENCIL SMOKE EVACUATOR (MISCELLANEOUS) ×2 IMPLANT
PLATE TUBUAL 1/3 6H (Plate) ×1 IMPLANT
SCREW CANC 2.5XFT HEX12X4X (Screw) IMPLANT
SCREW CANCELLOUS 4.0X12MM (Screw) ×2 IMPLANT
SCREW CANCELLOUS 4.0X14 (Screw) ×1 IMPLANT
SCREW CORTEX ST MATTA 3.5X14 (Screw) ×2 IMPLANT
SCREW CORTEX ST MATTA 3.5X16MM (Screw) ×1 IMPLANT
SCREW CORTEX ST MATTA 3.5X28MM (Screw) ×1 IMPLANT
SPLINT FAST PLASTER 5X30 (CAST SUPPLIES) ×20
SPLINT PLASTER CAST FAST 5X30 (CAST SUPPLIES) ×20 IMPLANT
SPONGE LAP 4X18 RFD (DISPOSABLE) ×2 IMPLANT
STRIP CLOSURE SKIN 1/2X4 (GAUZE/BANDAGES/DRESSINGS) ×1 IMPLANT
SUCTION FRAZIER HANDLE 10FR (MISCELLANEOUS) ×2
SUCTION TUBE FRAZIER 10FR DISP (MISCELLANEOUS) ×1 IMPLANT
SUT ETHILON 3 0 PS 1 (SUTURE) ×2 IMPLANT
SUT MNCRL AB 4-0 PS2 18 (SUTURE) ×1 IMPLANT
SUT MON AB 2-0 CT1 36 (SUTURE) ×1 IMPLANT
SUT MON AB 3-0 SH 27 (SUTURE) ×4
SUT MON AB 3-0 SH27 (SUTURE) IMPLANT
SUT VIC AB 0 CT1 36 (SUTURE) ×2 IMPLANT
SUT VIC AB 2-0 SH 27 (SUTURE) ×2
SUT VIC AB 2-0 SH 27XBRD (SUTURE) IMPLANT
SYR BULB EAR ULCER 3OZ GRN STR (SYRINGE) ×2 IMPLANT
TOWEL OR 17X26 10 PK STRL BLUE (TOWEL DISPOSABLE) ×3 IMPLANT
TUBE CONNECTING 12X1/4 (SUCTIONS) ×2 IMPLANT
UNDERPAD 30X36 HEAVY ABSORB (UNDERPADS AND DIAPERS) ×2 IMPLANT

## 2020-07-16 NOTE — Interval H&P Note (Signed)
History and Physical Interval Note:  07/16/2020 6:35 AM  [name]  has presented today for surgery, with the diagnosis of LEFT ANKLE FRACTURE.  The various methods of treatment have been discussed with the patient and family. After consideration of risks, benefits and other options for treatment, the patient has consented to  Procedure(s): OPEN REDUCTION INTERNAL FIXATION (ORIF) ANKLE FRACTURE (Left) as a surgical intervention.  The patient's history has been reviewed, patient examined, no change in status, stable for surgery.  I have reviewed the patient's chart and labs.  Questions were answered to the patient's satisfaction.     [name] D Yosgart Pavey   

## 2020-07-16 NOTE — Progress Notes (Unsigned)
Erroneous encounter

## 2020-07-16 NOTE — Progress Notes (Signed)
Report called to Caryl Pina, RN on 3E. Also informed Caryl Pina pt has seizure pads in place on bed d/t history of seizures.

## 2020-07-16 NOTE — Progress Notes (Signed)
Patient transferred to 1319 via bed and RN x 2, stable at transfer, received by Caryl Pina, RN. Sister at bedside prior to transfer and is aware.

## 2020-07-16 NOTE — Anesthesia Procedure Notes (Addendum)
Procedure Name: LMA Insertion Date/Time: 07/16/2020 8:58 AM Performed by: Myna Bright, CRNA Pre-anesthesia Checklist: Patient identified, Emergency Drugs available, Suction available and Patient being monitored Patient Re-evaluated:Patient Re-evaluated prior to induction Oxygen Delivery Method: Circle system utilized Preoxygenation: Pre-oxygenation with 100% oxygen Induction Type: IV induction Ventilation: Mask ventilation without difficulty LMA: LMA with gastric port inserted LMA Size: 4.0 Number of attempts: 1 Placement Confirmation: positive ETCO2 and breath sounds checked- equal and bilateral Tube secured with: Tape Dental Injury: Teeth and Oropharynx as per pre-operative assessment

## 2020-07-16 NOTE — Progress Notes (Signed)
Assisted Dr. Ellender with left, ultrasound guided, popliteal block. Side rails up, monitors on throughout procedure. See vital signs in flow sheet. Tolerated Procedure well. 

## 2020-07-16 NOTE — Interval H&P Note (Signed)
History and Physical Interval Note:  07/16/2020 6:35 AM  [name]  has presented today for surgery, with the diagnosis of LEFT ANKLE FRACTURE.  The various methods of treatment have been discussed with the patient and family. After consideration of risks, benefits and other options for treatment, the patient has consented to  Procedure(s): OPEN REDUCTION INTERNAL FIXATION (ORIF) ANKLE FRACTURE (Left) as a surgical intervention.  The patient's history has been reviewed, patient examined, no change in status, stable for surgery.  I have reviewed the patient's chart and labs.  Questions were answered to the patient's satisfaction.     Renette Butters

## 2020-07-16 NOTE — Transfer of Care (Signed)
Immediate Anesthesia Transfer of Care Note  Patient: Naylea Wigington  Procedure(s) Performed: OPEN REDUCTION INTERNAL FIXATION (ORIF) ANKLE FRACTURE (Left Ankle)  Patient Location: PACU  Anesthesia Type:GA combined with regional for post-op pain  Level of Consciousness: sedated and responds to stimulation  Airway & Oxygen Therapy: Patient Spontanous Breathing and Patient connected to face mask oxygen  Post-op Assessment: Report given to RN and Post -op Vital signs reviewed and stable  Post vital signs: Reviewed and stable  Last Vitals:  Vitals Value Taken Time  BP 179/108 07/16/20 1024  Temp 36.6 C 07/16/20 1024  Pulse 77 07/16/20 1026  Resp 21 07/16/20 1026  SpO2 94 % 07/16/20 1026  Vitals shown include unvalidated device data.  Last Pain:  Vitals:   07/16/20 0711  TempSrc: Oral  PainSc: 0-No pain      Patients Stated Pain Goal: 5 (99/83/38 2505)  Complications: No complications documented.

## 2020-07-16 NOTE — Anesthesia Procedure Notes (Signed)
Anesthesia Regional Block: Popliteal block   Pre-Anesthetic Checklist: ,, timeout performed, Correct Patient, Correct Site, Correct Laterality, Correct Procedure,, site marked, risks and benefits discussed, Surgical consent,  Pre-op evaluation,  At surgeon's request and post-op pain management  Laterality: Left  Prep: chloraprep       Needles:  Injection technique: Single-shot  Needle Type: Echogenic Stimulator Needle     Needle Length: 10cm  Needle Gauge: 20     Additional Needles:   Procedures:,,,, ultrasound used (permanent image in chart),,,,  Narrative:  Start time: 07/16/2020 7:50 AM End time: 07/16/2020 8:00 AM Injection made incrementally with aspirations every 5 mL.  Performed by: Personally  Anesthesiologist: Murvin Natal, MD  Additional Notes: Functioning IV was confirmed and monitors were applied.  A 159mm 20ga BBraun echogenic stimulator needle was used. Sterile prep, hand hygiene and sterile gloves were used.  Negative aspiration and negative test dose prior to incremental administration of local anesthetic. The patient tolerated the procedure well.

## 2020-07-16 NOTE — Discharge Instructions (Signed)
It is very important for you to Elevate your leg - Toes above nose as much as possible to reduce pain / swelling. If needed, you may increase pain medication for the first few days post op to 2 tablets every 4 hours.  Weight Bearing:  Non weight bearing affected leg.  Diet: As you were doing prior to hospitalization   Shower:  You have a splint on, leave the splint in place and keep the splint dry with a plastic bag.  Dressing:  You have a splint. Leave the splint in place and we will change your bandages during your first follow-up appointment.  You may loosen and re-apply ace wrap if it feels too tight.    Activity:  Increase activity slowly as tolerated, but follow the weight bearing instructions below.  The rules on driving is that you can not be taking narcotics while you drive, and you must feel in control of the vehicle.    To prevent constipation:  Narcotic medicines cause constipation.  Wean these as soon as is appropriate.   You may use a stool softener such as -  Colace (over the counter) 100 mg by mouth twice a day  Drink plenty of fluids (prune juice may be helpful) and high fiber foods Miralax (over the counter) for constipation as needed.    Itching:  If you experience itching with your medications, try taking only a single pain pill, or even half a pain pill at a time.  You can also use benadryl over the counter for itching or also to help with sleep.   Precautions:  If you experience chest pain or shortness of breath - call 911 immediately for transfer to the hospital emergency department!!  If you develop a fever greater that 101 F, purulent drainage from wound, increased redness or drainage from wound, or calf pain -- Call the office at [phone]                                                 Follow- Up Appointment:  Please call for an appointment to be seen in 1-2 weeks [place] - [phone]   Post Anesthesia Home Care Instructions  Activity: Get plenty  of rest for the remainder of the day. A responsible individual must stay with you for 24 hours following the procedure.  For the next 24 hours, DO NOT: -Drive a car -Advertising copywriter -Drink alcoholic beverages -Take any medication unless instructed by your physician -Make any legal decisions or sign important papers.  Meals: Start with liquid foods such as gelatin or soup. Progress to regular foods as tolerated. Avoid greasy, spicy, heavy foods. If nausea and/or vomiting occur, drink only clear liquids until the nausea and/or vomiting subsides. Call your physician if vomiting continues.  Special Instructions/Symptoms: Your throat may feel dry or sore from the anesthesia or the breathing tube placed in your throat during surgery. If this causes discomfort, gargle with warm salt water. The discomfort should disappear within 24 hours.  If you had a scopolamine patch placed behind your ear for the management of post- operative nausea and/or vomiting:  1. The medication in the patch is effective for 72 hours, after which it should be removed.  Wrap patch in a tissue and discard in the trash. Wash hands thoroughly with soap and water. 2. You may remove the patch  earlier than 72 hours if you experience unpleasant side effects which may include dry mouth, dizziness or visual disturbances. 3. Avoid touching the patch. Wash your hands with soap and water after contact with the patch.    Regional Anesthesia Blocks  1. Numbness or the inability to move the "blocked" extremity may last from 3-48 hours after placement. The length of time depends on the medication injected and your individual response to the medication. If the numbness is not going away after 48 hours, call your surgeon.  2. The extremity that is blocked will need to be protected until the numbness is gone and the  Strength has returned. Because you cannot feel it, you will need to take extra care to avoid injury. Because it may be weak,  you may have difficulty moving it or using it. You may not know what position it is in without looking at it while the block is in effect.  3. For blocks in the legs and feet, returning to weight bearing and walking needs to be done carefully. You will need to wait until the numbness is entirely gone and the strength has returned. You should be able to move your leg and foot normally before you try and bear weight or walk. You will need someone to be with you when you first try to ensure you do not fall and possibly risk injury.  4. Bruising and tenderness at the needle site are common side effects and will resolve in a few days.  5. Persistent numbness or new problems with movement should be communicated to the surgeon.     Ankle Fracture Treated With ORIF, Care After This sheet gives you information about how to care for yourself after your procedure. Your health care provider may also give you more specific instructions. If you have problems or questions, contact your health care provider. What can I expect after the procedure? After the procedure, it is common to have:  Pain.  Swelling.  A small amount of fluid from your incision. Follow these instructions at home: Medicines  Take over-the-counter and prescription medicines only as told by your health care provider.  Ask your health care provider if the medicine prescribed to you: ? Requires you to avoid driving or using machinery. ? Can cause constipation. You may need to take these actions to prevent or treat constipation:  Drink enough fluid to keep your urine pale yellow.  Take over-the-counter or prescription medicines.  Eat foods that are high in fiber, such as beans, whole grains, and fresh fruits and vegetables.  Limit foods that are high in fat and processed sugars, such as fried or sweet foods. If you have a splint or boot:  Wear the splint or boot as told by your health care provider. Remove it only as told by your  health care provider.  Loosen the splint or boot if your toes tingle, become numb, or turn cold and blue.  Keep the splint or boot clean. If you have a cast:  Do not stick anything inside the cast to scratch your skin. Doing that increases your risk of infection.  Check the skin around the cast every day. Tell your health care provider about any concerns.  You may put lotion on dry skin around the edges of the cast. Do not put lotion on the skin underneath the cast.  Keep the cast clean. Bathing  Do not take baths, swim, or use a hot tub until your health care provider approves. Ask  your health care provider if you may take showers. You may only be allowed to take sponge baths.  If your splint, boot, or cast is not waterproof: ? Do not let it get wet. ? Cover it with a watertight covering when you take a bath or a shower.  Keep the bandage (dressing) dry until your health care provider says it can be removed. Incision care  Follow instructions from your health care provider about how to take care of your incision. Make sure you: ? Wash your hands with soap and water for at least 20 seconds before and after you change your dressing. If soap and water are not available, use hand sanitizer. ? Change your dressing as told by your health care provider. ? Leave stitches (sutures), skin glue, or adhesive strips in place. These skin closures may need to stay in place for 2 weeks or longer. If adhesive strip edges start to loosen and curl up, you may trim the loose edges. Do not remove adhesive strips completely unless your health care provider tells you to do that.  Check your incision area every day for signs of infection. Check for: ? Redness. ? More pain or more swelling. ? Blood or more fluid. ? Warmth. ? Pus or a bad smell.   Managing pain, stiffness, and swelling  If directed, put ice on the affected area. To do this: ? If you have a removable splint or boot, remove it as told by  your health care provider. ? Put ice in a plastic bag. ? Place a towel between your skin and the bag, or between your cast and the bag. ? Leave the ice on for 20 minutes, 2-3 times a day. ? Remove the ice if your skin turns bright red. This is very important. If you cannot feel pain, heat, or cold, you have a greater risk of damage to the area.  Move your toes often to reduce stiffness and swelling.  Raise (elevate) the injured area above the level of your heart while you are sitting or lying down. To do this, try putting a few pillows under your leg and ankle.   Activity  Do not use your injured limb to support (bear) your body weight until your health care provider says that you can. Follow weight-bearing restrictions as told. Use crutches or other assistive devicesto help you move around as told by your health care provider.  Ask your health care provider when it is safe to drive if you have a splint, boot, or cast on your foot.  Do exercises as told by your health care provider or physical therapist.  Return to your normal activities as told by your health care provider. Ask your health care provider what activities are safe for you. General instructions  Do not put pressure on any part of the splint or cast until it is fully hardened, if applicable. This may take several hours.  Do not use any products that contain nicotine or tobacco, such as cigarettes, e-cigarettes, and chewing tobacco. These can delay bone healing. If you need help quitting, ask your health care provider.  Keep all follow-up visits. This is important. Contact a health care provider if:  You have a fever.  Your pain medicine is not helping.  You have redness around your incision.  You have more swelling or severe pain around your incision.  You have more fluid or blood coming from your incision or leaking through your cast.  Your incision feels warm to  the touch.  You have pus or a bad smell coming from  your incision or from your cast or dressing. Get help right away if:  The edges of your incision come apart after the stitches or staples have been taken out.  You have chest pain.  You have trouble breathing.  Your foot or leg feels numb or tingles.  Your foot becomes cold, pale, or blue.  You have calf swelling or tenderness. These symptoms may represent a serious problem that is an emergency. Do not wait to see if the symptoms will go away. Get medical help right away. Call your local emergency services (911 in the [place] Do not drive yourself to the hospital. Summary  After the procedure, it is common to have pain and swelling.  If your splint, boot, or cast is not waterproof, do not let it get wet.  Contact your health care provider if you have more swelling or severe pain, or if you have more fluids coming from your incision or leaking through your cast.  Get help right away if you have numbness or tingling in your foot or leg, or if your foot becomes cold, pale, or blue. This information is not intended to replace advice given to you by your health care provider. Make sure you discuss any questions you have with your health care provider. Document Revised: 09/25/2019 Document Reviewed: 09/25/2019 Elsevier Patient Education  2021 [employer].

## 2020-07-16 NOTE — Op Note (Signed)
07/16/2020  10:18 AM  PATIENT:  [name]    PRE-OPERATIVE DIAGNOSIS:  LEFT ANKLE FRACTURE  POST-OPERATIVE DIAGNOSIS:  Same  PROCEDURE:  OPEN REDUCTION INTERNAL FIXATION (ORIF) ANKLE FRACTURE  SURGEON:  Renette Butters, MD  ASSISTANT: Margy Clarks, PA-C, he was present and scrubbed throughout the case, critical for completion in a timely fashion, and for retraction, instrumentation, and closure.   ANESTHESIA:   gen   PREOPERATIVE INDICATIONS:  [name] is a  60 y.o. female with a diagnosis of LEFT ANKLE FRACTURE who failed conservative measures and elected for surgical management.    The risks benefits and alternatives were discussed with the patient preoperatively including but not limited to the risks of infection, bleeding, nerve injury, cardiopulmonary complications, the need for revision surgery, among others, and the patient was willing to proceed.  OPERATIVE IMPLANTS: stryker plate and arthrex tightrope  OPERATIVE FINDINGS: Unstable ankle fracture. Stable syndesmosis post op  BLOOD LOSS: min  COMPLICATIONS: none  TOURNIQUET TIME: 30min  OPERATIVE PROCEDURE:  Patient was identified in the preoperative holding area and site was marked by me He was transported to the operating theater and placed on the table in supine position taking care to pad all bony prominences. After a preincinduction time out anesthesia was induced. The left lower extremity was prepped and draped in normal sterile fashion and a pre-incision timeout was performed. [name] received vanc for preoperative antibiotics.   I made a lateral incision of roughly 7 cm dissection was carried down sharply to the distal fibula and then spreading dissection was used proximally to protect the superficial peroneal nerve. I sharply incised the periosteum and took care to protect the peroneal tendons. I then debrided the fracture site and performed a reduction maneuver which was held in  place with a clamp.   I placed a lag screw across the fracture  I then selected a 6-hole one third tubular plate and placed in a neutralization fashion care was taken distally so as not to penetrate the joint with the cancellus screws.  I then stressed the syndesmosis and it was unstable for syndesmotic fixation I performed a reduction maneuver with a clamp and placed a tightrope  The wound was then thoroughly irrigated and closed using a 0 Vicryl and absorbable Monocryl sutures. He was placed in a short leg splint.   POST OPERATIVE PLAN: Non-weightbearing. DVT prophylaxis will consist of mobilization and chemical px

## 2020-07-16 NOTE — Anesthesia Postprocedure Evaluation (Signed)
Anesthesia Post Note  Patient: Hilliary Jock  Procedure(s) Performed: OPEN REDUCTION INTERNAL FIXATION (ORIF) ANKLE FRACTURE (Left Ankle)     Patient location during evaluation: PACU Anesthesia Type: Regional and General Level of consciousness: awake Pain management: pain level controlled Vital Signs Assessment: post-procedure vital signs reviewed and stable Respiratory status: spontaneous breathing, nonlabored ventilation, respiratory function stable and patient connected to nasal cannula oxygen Cardiovascular status: blood pressure returned to baseline and stable Postop Assessment: no apparent nausea or vomiting Anesthetic complications: no   No complications documented.  Last Vitals:  Vitals:   07/16/20 1703 07/16/20 1745  BP: (!) 179/81 (!) 191/89  Pulse: 78 80  Resp: 17 17  Temp: 36.9 C 36.9 C  SpO2: 95% 96%    Last Pain:  Vitals:   07/16/20 1930  TempSrc:   PainSc: 7                  Ayaka Andes P Terrez Ander

## 2020-07-16 NOTE — Evaluation (Signed)
Physical Therapy Evaluation Patient Details Name: [name] MRN: [id number] DOB: 02-08-1961 Today's Date: 07/16/2020   History of Present Illness  60 y.o. female with PMH of multiple sclerosis, OSA, bipolar D/O, anxiety, seizure d/o, fibromyalgia who sustained a L fibula fx on 07/08/20, she is now s/p ORIF on 07/16/20.  Clinical Impression  Pt admitted with above diagnosis. Max assist for supine to sit, pt dizzy in sitting, BP 191/114, HR 80, SaO2 97% on room air. Assisted pt back to supine where BP was 206/87, RN notified of HTN. Pt reports h/o 8 falls in past 1 year. She is not safe to DC home where she has 3 steps to enter and 6 steps to her bedroom. CIR or ST-SNF recommended.  Pt currently with functional limitations due to the deficits listed below (see PT Problem List). Pt will benefit from skilled PT to increase their independence and safety with mobility to allow discharge to the venue listed below.       Follow Up Recommendations SNF;CIR    Equipment Recommendations    wheelchair, WC cushion   Recommendations for Other Services   OT    Precautions / Restrictions Precautions Precautions: Fall Precaution Comments: pt reports h/o 8 falls in past 1 year, several were after a recent cataract surgery when she had blurry vision Restrictions LLE Weight Bearing: Non weight bearing      Mobility  Bed Mobility Overal bed mobility: Needs Assistance Bed Mobility: Supine to Sit;Sit to Supine     Supine to sit: Max assist;HOB elevated Sit to supine: Max assist   General bed mobility comments: assist to advance LLE and to raise trunk, VCs for technique. Pt reported feeling dizzy in sitting at edge of bed. BP 191/114, HR 80, SaO2 97% on room air. Assisted pt back to supine where BP was 206/87. RN notified of HTN. Patient Response: Anxious  Transfers                    Ambulation/Gait                Stairs            Wheelchair Mobility    Modified  Rankin (Stroke Patients Only)       Balance Overall balance assessment: Needs assistance Sitting-balance support: Feet supported Sitting balance-Leahy Scale: Fair                                       Pertinent Vitals/Pain Pain Assessment: 0-10 Pain Score: 8  Pain Location: L ankle Pain Descriptors / Indicators: Sore Pain Intervention(s): Limited activity within patient's tolerance;Monitored during session;Premedicated before session;Patient requesting pain meds-RN notified    Home Living Family/patient expects to be discharged to:: Private residence   Available Help at Discharge: Family   Home Access: Stairs to enter Entrance Stairs-Rails: Left Entrance Stairs-Number of Steps: 3 Home Layout: Two level Home Equipment: [place] - 4 wheels;Cane - quad      Prior Function Level of Independence: Needs assistance   Gait / Transfers Assistance Needed: sister helped with stairs, pt would sit down to negotiate stairs, walked with quad cane at baseline, pt reports 8 falls in past 1 year  ADL's / Homemaking Assistance Needed: sister helps with cooking, cleaning, bathing, dressing  Comments: sister lives in same home and is available 24*     Hand Dominance  Extremity/Trunk Assessment   Upper Extremity Assessment Upper Extremity Assessment: LUE deficits/detail LUE Deficits / Details: pt reports she injured L shoulder when she fell, unable to elevate LUE independently 2* pain    Lower Extremity Assessment Lower Extremity Assessment: LLE deficits/detail LLE Deficits / Details: SLR -2/5, knee AAROM WFL LLE: Unable to fully assess due to pain LLE Sensation: decreased light touch    Cervical / Trunk Assessment Cervical / Trunk Assessment: Normal  Communication   Communication: No difficulties  Cognition Arousal/Alertness: Awake/alert Behavior During Therapy: WFL for tasks assessed/performed Overall Cognitive Status: Within Functional Limits for tasks  assessed                                        General Comments      Exercises General Exercises - Lower Extremity Heel Slides: AAROM;Left;5 reps;Supine   Assessment/Plan    PT Assessment Patient needs continued PT services  PT Problem List Decreased activity tolerance;Decreased balance;Decreased knowledge of use of DME;Decreased mobility;Pain       PT Treatment Interventions Gait training;Therapeutic activities;DME instruction;Therapeutic exercise;Functional mobility training;Patient/family education    PT Goals (Current goals can be found in the Care Plan section)  Acute Rehab PT Goals Patient Stated Goal: to be safe going home, agreeable to ST-SNF or CIR PT Goal Formulation: With patient/family Time For Goal Achievement: 07/30/20 Potential to Achieve Goals: Good    Frequency Min 3X/week   Barriers to discharge        Co-evaluation               AM-PAC PT "6 Clicks" Mobility  Outcome Measure Help needed turning from your back to your side while in a flat bed without using bedrails?: A Lot Help needed moving from lying on your back to sitting on the side of a flat bed without using bedrails?: A Lot Help needed moving to and from a bed to a chair (including a wheelchair)?: Total Help needed standing up from a chair using your arms (e.g., wheelchair or bedside chair)?: Total Help needed to walk in hospital room?: Total Help needed climbing 3-5 steps with a railing? : Total 6 Click Score: 8    End of Session   Activity Tolerance: Treatment limited secondary to medical complications (Comment) (HTN) Patient left: in bed;with call bell/phone within reach;with family/visitor present Nurse Communication: Mobility status;Other (comment) (HTN) PT Visit Diagnosis: Difficulty in walking, not elsewhere classified (R26.2);Pain;History of falling (Z91.81);Repeated falls (R29.6) Pain - Right/Left: Left Pain - part of body: Ankle and joints of foot     Time: 1446-1519 PT Time Calculation (min) (ACUTE ONLY): 33 min   Charges:   PT Evaluation $PT Eval Moderate Complexity: 1 Mod PT Treatments $Therapeutic Activity: 8-22 mins       Blondell Reveal Kistler PT 07/16/2020  Acute Rehabilitation Services Pager [phone] Office [phone]

## 2020-07-17 ENCOUNTER — Encounter (HOSPITAL_BASED_OUTPATIENT_CLINIC_OR_DEPARTMENT_OTHER): Payer: Self-pay | Admitting: Orthopedic Surgery

## 2020-07-17 ENCOUNTER — Telehealth: Payer: Self-pay | Admitting: *Deleted

## 2020-07-17 DIAGNOSIS — Z87891 Personal history of nicotine dependence: Secondary | ICD-10-CM | POA: Diagnosis not present

## 2020-07-17 DIAGNOSIS — Z9981 Dependence on supplemental oxygen: Secondary | ICD-10-CM | POA: Diagnosis not present

## 2020-07-17 DIAGNOSIS — X58XXXA Exposure to other specified factors, initial encounter: Secondary | ICD-10-CM | POA: Diagnosis not present

## 2020-07-17 DIAGNOSIS — S8262XA Displaced fracture of lateral malleolus of left fibula, initial encounter for closed fracture: Secondary | ICD-10-CM | POA: Diagnosis not present

## 2020-07-17 DIAGNOSIS — I10 Essential (primary) hypertension: Secondary | ICD-10-CM | POA: Diagnosis not present

## 2020-07-17 DIAGNOSIS — J45909 Unspecified asthma, uncomplicated: Secondary | ICD-10-CM | POA: Diagnosis not present

## 2020-07-17 DIAGNOSIS — G4733 Obstructive sleep apnea (adult) (pediatric): Secondary | ICD-10-CM | POA: Diagnosis not present

## 2020-07-17 DIAGNOSIS — Z79899 Other long term (current) drug therapy: Secondary | ICD-10-CM | POA: Diagnosis not present

## 2020-07-17 DIAGNOSIS — R69 Illness, unspecified: Secondary | ICD-10-CM | POA: Diagnosis not present

## 2020-07-17 DIAGNOSIS — I519 Heart disease, unspecified: Secondary | ICD-10-CM | POA: Diagnosis not present

## 2020-07-17 MED ORDER — LAMOTRIGINE 100 MG PO TABS
200.0000 mg | ORAL_TABLET | Freq: Every day | ORAL | Status: DC
  Administered 2020-07-17: 200 mg via ORAL
  Filled 2020-07-17: qty 2

## 2020-07-17 MED ORDER — BENZTROPINE MESYLATE 0.5 MG PO TABS
0.5000 mg | ORAL_TABLET | Freq: Every day | ORAL | Status: DC
  Administered 2020-07-17 – 2020-07-18 (×2): 0.5 mg via ORAL
  Filled 2020-07-17 (×2): qty 1

## 2020-07-17 MED ORDER — ZIPRASIDONE HCL 20 MG PO CAPS
20.0000 mg | ORAL_CAPSULE | Freq: Two times a day (BID) | ORAL | Status: DC
  Administered 2020-07-17 – 2020-07-18 (×2): 20 mg via ORAL
  Filled 2020-07-17 (×3): qty 1

## 2020-07-17 MED ORDER — MOMETASONE FURO-FORMOTEROL FUM 200-5 MCG/ACT IN AERO
2.0000 | INHALATION_SPRAY | Freq: Two times a day (BID) | RESPIRATORY_TRACT | Status: DC
  Administered 2020-07-17 – 2020-07-18 (×3): 2 via RESPIRATORY_TRACT
  Filled 2020-07-17: qty 8.8

## 2020-07-17 MED ORDER — FLUOXETINE HCL 20 MG PO CAPS
80.0000 mg | ORAL_CAPSULE | Freq: Every day | ORAL | Status: DC
  Administered 2020-07-17 – 2020-07-18 (×2): 80 mg via ORAL
  Filled 2020-07-17 (×2): qty 4

## 2020-07-17 MED ORDER — MIRTAZAPINE 15 MG PO TABS
15.0000 mg | ORAL_TABLET | Freq: Every day | ORAL | Status: DC
  Administered 2020-07-17: 15 mg via ORAL
  Filled 2020-07-17: qty 1

## 2020-07-17 MED ORDER — ALBUTEROL SULFATE (2.5 MG/3ML) 0.083% IN NEBU: 3.0000 mL | INHALATION_SOLUTION | RESPIRATORY_TRACT | Status: DC | PRN

## 2020-07-17 MED ORDER — EPINEPHRINE 0.3 MG/0.3ML IJ SOAJ
0.3000 mg | Freq: Every day | INTRAMUSCULAR | Status: DC | PRN
  Filled 2020-07-17: qty 0.6

## 2020-07-17 MED ORDER — FAMOTIDINE 20 MG PO TABS
20.0000 mg | ORAL_TABLET | Freq: Every day | ORAL | Status: DC
  Administered 2020-07-17 – 2020-07-18 (×2): 20 mg via ORAL
  Filled 2020-07-17 (×2): qty 1

## 2020-07-17 MED ORDER — DIFLUPREDNATE 0.05 % OP EMUL: Freq: Every day | OPHTHALMIC | Status: DC

## 2020-07-17 MED ORDER — METOPROLOL SUCCINATE ER 50 MG PO TB24
50.0000 mg | ORAL_TABLET | Freq: Every day | ORAL | Status: DC
  Administered 2020-07-17 – 2020-07-18 (×2): 50 mg via ORAL
  Filled 2020-07-17 (×2): qty 1

## 2020-07-17 MED ORDER — HYDROXYZINE HCL 25 MG PO TABS
25.0000 mg | ORAL_TABLET | Freq: Four times a day (QID) | ORAL | Status: DC | PRN
  Administered 2020-07-17: 25 mg via ORAL
  Filled 2020-07-17: qty 1

## 2020-07-17 NOTE — Progress Notes (Signed)
Inpatient Rehab Admissions Coordinator Note:   Per therapy recommendations, pt was screened for CIR candidacy by Shann Medal, PT, DPT.  Unfortunately, [place] [employer] will not approve a diagnosis of ankle fracture for CIR.  Note PMH of MS, but without active exacerbation, would not be enough to qualify her for CIR.  Will need to seek f/u therapy in alternative level of care.  Please contact me with questions.   Shann Medal, PT, DPT [phone] 07/17/20 11:47 AM

## 2020-07-17 NOTE — Addendum Note (Signed)
Addendum  created 07/17/20 0853 by Rogers Blocker, CRNA   Charge Capture section accepted

## 2020-07-17 NOTE — NC FL2 (Signed)
[place] LEVEL OF CARE SCREENING TOOL     IDENTIFICATION  Patient Name: [name] Birthdate: 14-Sep-1960 Sex: female Admission Date (Current Location): 07/16/2020  [facility] and [place] Number:  Herbalist and Address:  [facility],  [place], [place]      Provider Number: [id number]  Attending Physician Name and Address:  Renette Butters, MD  Relative Name and Phone Number:  Couch,Beverly Sister [phone]    Current Level of Care: Hospital Recommended Level of Care: [place] Prior Approval Number:    Date Approved/Denied:   PASRR Number:    Discharge Plan:      Current Diagnoses: Patient Active Problem List   Diagnosis Date Noted  . Open left ankle fracture 07/16/2020  . Chronic respiratory failure with hypoxia ([place]) 05/06/2020  . Schizoaffective disorder, bipolar type ([place]) 10/04/2019  . Mixed hyperlipidemia 10/04/2019  . Vitamin D deficiency 10/04/2019  . Mild neurocognitive disorder, unclear etiology 09/08/2019  . Major depressive disorder   . Female pelvic peritoneal adhesions 12/19/2018  . Galactorrhea not associated with childbirth 12/19/2018  . Heart disease 12/19/2018  . Class 3 severe obesity due to excess calories with serious comorbidity and body mass index (BMI) of 40.0 to 44.9 in adult ([place]) 12/19/2018  . Pain in pelvis 12/19/2018  . Oropharyngeal dysphagia 03/01/2018  . Weakness 09/11/2016  . Seizure disorder ([place]) 12/30/2013  . Disorder of sacrum 11/03/2013  . Hiatal hernia 11/03/2013  . Hypertension 11/03/2013  . Intractable migraine with aura 11/03/2013  . Lumbosacral spondylosis 11/03/2013  . Multiple sclerosis ([place]) 11/03/2013  . Fibromyalgia 11/03/2013  . DDD (degenerative disc disease), lumbosacral 10/03/2013  . Thyroid disease 10/03/2013  . Lumbar radiculopathy 08/30/2012  . Generalized anxiety disorder 04/05/2012  . Elevated LFTs 10/15/2010   . Abdominal pain 09/27/2009  . Asthma, moderate persistent 06/04/2009  . Vocal cord disorder 04/02/2009  . Personal history of allergy to seafood 10/10/2008  . Allergic rhinitis 09/20/2008  . ALLERGY TO INSECTS AND ARACHNIDS 09/20/2008  . Dysphagia, pharyngoesophageal phase 09/12/2008  . GERD (gastroesophageal reflux disease) 07/05/2008  . Bipolar I disorder ([place]) 06/13/2008  . OSA (obstructive sleep apnea) 06/13/2008  . Arthritis 06/13/2008  . Chronic back pain 06/13/2008  . Hyperglycemia 06/13/2008    Orientation RESPIRATION BLADDER Height & Weight     Self,Time,Situation,Place  Normal Continent Weight: 273 lb (123.8 kg) Height:  5\' 9"  (175.3 cm)  BEHAVIORAL SYMPTOMS/MOOD NEUROLOGICAL BOWEL NUTRITION STATUS      Continent Diet (Regular Diet)  AMBULATORY STATUS COMMUNICATION OF NEEDS Skin   Extensive Assist Verbally Other (Comment) (Ankle Fracture Compression Wrap)                       Personal Care Assistance Level of Assistance  Bathing,Feeding,Dressing Bathing Assistance: Maximum assistance Feeding assistance: Independent       Functional Limitations Info  Sight,Hearing,Speech Sight Info: Adequate Hearing Info: Adequate Speech Info: Adequate    SPECIAL CARE FACTORS FREQUENCY  PT (By licensed PT),OT (By licensed OT)     PT Frequency: 5x/week OT Frequency: 5x/week            Contractures Contractures Info: Not present    Additional Factors Info  Code Status,Allergies Code Status Info: Fullcode Allergies Info: Allergies: Bee Venom, Depakote (Divalproex Sodium), Haloperidol Lactate, Hornet Venom, Latex, Peanut-containing Drug Products, Penicillins, Quetiapine, Shellfish-derived Products, Valproic Acid, Amitriptyline Hcl, Butorphanol Tartrate, Gabapentin, Iodine, Pregabalin, Terbutaline  Current Medications (07/17/2020):  This is the current hospital active medication list Current Facility-Administered Medications  Medication Dose Route  Frequency Provider Last Rate Last Admin  . acetaminophen (TYLENOL) tablet 325-650 mg  325-650 mg Oral Q6H PRN Margy Clarks M, PA-C      . albuterol (PROVENTIL) (2.5 MG/3ML) 0.083% nebulizer solution 3 mL  3 mL Nebulization Q4H PRN Merlene Pulling K, PA-C      . aspirin EC tablet 81 mg  81 mg Oral BID Margy Clarks M, PA-C   81 mg at 07/17/20 0920  . benztropine (COGENTIN) tablet 0.5 mg  0.5 mg Oral Daily Merlene Pulling K, PA-C   0.5 mg at 07/17/20 1006  . bisacodyl (DULCOLAX) suppository 10 mg  10 mg Rectal Daily PRN Margy Clarks M, PA-C      . celecoxib (CELEBREX) capsule 200 mg  200 mg Oral BID Margy Clarks M, PA-C   200 mg at 07/17/20 0920  . Difluprednate 0.05 % EMUL   Right Eye Daily Merlene Pulling K, PA-C      . docusate sodium (COLACE) capsule 100 mg  100 mg Oral BID Margy Clarks M, PA-C   100 mg at 07/17/20 [id number]  . EPINEPHrine (EPI-PEN) injection 0.3 mg  0.3 mg Intramuscular Daily PRN Merlene Pulling K, PA-C      . famotidine (PEPCID) tablet 20 mg  20 mg Oral Daily Merlene Pulling K, PA-C   20 mg at 07/17/20 1006  . FLUoxetine (PROZAC) capsule 80 mg  80 mg Oral Daily Merlene Pulling K, PA-C   80 mg at 07/17/20 1007  . HYDROcodone-acetaminophen (NORCO) 7.5-325 MG per tablet 1-2 tablet  1-2 tablet Oral Q4H PRN Rachael Fee, PA-C   2 tablet at 07/17/20 0920  . HYDROcodone-acetaminophen (NORCO/VICODIN) 5-325 MG per tablet 1-2 tablet  1-2 tablet Oral Q4H PRN Margy Clarks M, PA-C      . hydrOXYzine (ATARAX/VISTARIL) tablet 25 mg  25 mg Oral QID PRN Merlene Pulling K, PA-C      . lamoTRIgine (LAMICTAL) tablet 200 mg  200 mg Oral QHS Brown, Blaine K, PA-C      . methocarbamol (ROBAXIN) tablet 500 mg  500 mg Oral Q6H PRN Rachael Fee, PA-C   500 mg at 07/17/20 0920   Or  . methocarbamol (ROBAXIN) 500 mg in dextrose 5 % 50 mL IVPB  500 mg Intravenous Q6H PRN Margy Clarks M, PA-C      . metoCLOPramide (REGLAN) tablet 5-10 mg  5-10 mg Oral Q8H PRN Margy Clarks M, PA-C       Or   . metoCLOPramide (REGLAN) injection 5-10 mg  5-10 mg Intravenous Q8H PRN Margy Clarks M, PA-C      . metoprolol succinate (TOPROL-XL) 24 hr tablet 50 mg  50 mg Oral Daily Merlene Pulling K, PA-C   50 mg at 07/17/20 1007  . metoprolol tartrate (LOPRESSOR) injection 5 mg  5 mg Intravenous Q6H PRN Rachael Fee, PA-C   5 mg at 07/16/20 1845  . mirtazapine (REMERON) tablet 15 mg  15 mg Oral QHS Brown, Blaine K, PA-C      . mometasone-formoterol [facility]) 200-5 MCG/ACT inhaler 2 puff  2 puff Inhalation BID Ventura Bruns, PA-C   2 puff at 07/17/20 1112  . morphine 4 MG/ML injection 0.52-1 mg  0.52-1 mg Intravenous Q2H PRN Rachael Fee, PA-C   1 mg at 07/17/20 1007  . ondansetron (ZOFRAN) tablet 4 mg  4 mg Oral Q6H PRN Margy Clarks  M, PA-C       Or  . ondansetron (ZOFRAN) injection 4 mg  4 mg Intravenous Q6H PRN Margy Clarks M, PA-C      . senna-docusate (Senokot-S) tablet 1 tablet  1 tablet Oral QHS PRN Margy Clarks M, PA-C      . ziprasidone (GEODON) capsule 20 mg  20 mg Oral BID WC Ventura Bruns, PA-C         Discharge Medications: Please see discharge summary for a list of discharge medications.  Relevant Imaging Results:  Relevant Lab Results:   Additional Information SSN: [id number]  Lia Hopping, LCSW

## 2020-07-17 NOTE — TOC Initial Note (Signed)
Transition of Care [facility]) - Initial/Assessment Note    Patient Details  Name: [name] MRN: [id number] Date of Birth: 1961/01/05  [facility] CM/SW Contact:    Lia Hopping, [place] Phone Number: 07/17/2020, 1:33 PM  Clinical Narrative:   Re: SNF Patient admitted after a fall and left ankle fracture. Patient underwent surgical repair.              CSW met with the patient and her sister at bedside to discuss rehab placement at [facility]. Patient agreeable to SNF placement for short rehab. Patient lives with her sister and requires assistance with her ADL's. Patient reports she ambulates short distances with a RW. CSW explain the SNF process and notified the patient about the [employer]. Patient reports understanding.  CSW will follow up with bed offers today.- None pending so far.  Patient PASRR level II manuel review.   Expected Discharge Plan: Skilled Nursing Facility Barriers to Discharge: Continued Medical Work up,Awaiting State Approval Rosalie Gums)   Patient Goals and CMS Choice Patient states their goals for this hospitalization and ongoing recovery are:: walk without pain CMS Medicare.gov Compare Post Acute Care list provided to:: Patient Choice offered to / list presented to : Patient  Expected Discharge Plan and Services Expected Discharge Plan: [place] In-house Referral: Clinical Social Work Discharge Planning Services: CM Consult Post Acute Care Choice: [address] Living arrangements for the past 2 months: [place]                                      Prior Living Arrangements/Services Living arrangements for the past 2 months: Single Family Home Lives with:: Siblings          Need for Family Participation in Patient Care: Yes (Comment) Care giver support system in place?: Yes (comment) Current home services: DME Criminal Activity/Legal Involvement Pertinent to Current Situation/Hospitalization:  No - Comment as needed  Activities of Daily Living Home Assistive Devices/Equipment: Cytogeneticist (specify type) ADL Screening (condition at time of admission) Patient's cognitive ability adequate to safely complete daily activities?: Yes Is the patient deaf or have difficulty hearing?: No Does the patient have difficulty seeing, even when wearing glasses/contacts?: No Does the patient have difficulty concentrating, remembering, or making decisions?: No Patient able to express need for assistance with ADLs?: Yes Does the patient have difficulty dressing or bathing?: No Independently performs ADLs?: Yes (appropriate for developmental age) Does the patient have difficulty walking or climbing stairs?: Yes Weakness of Legs: Left Weakness of Arms/Hands: None  Permission Sought/Granted Permission sought to share information with : Family Supports Permission granted to share information with : Yes, Verbal Permission Granted     Permission granted to share info w AGENCY: SNF's in the area        Emotional Assessment Appearance:: Appears stated age Attitude/Demeanor/Rapport: Engaged Affect (typically observed): Accepting,Pleasant Orientation: : Oriented to Self,Oriented to Place,Oriented to  Time,Oriented to Situation Alcohol / Substance Use: Not Applicable Psych Involvement: No (comment)  Admission diagnosis:  Open left ankle fracture [S82.892B] Patient Active Problem List   Diagnosis Date Noted  . Open left ankle fracture 07/16/2020  . Chronic respiratory failure with hypoxia ([place]) 05/06/2020  . Schizoaffective disorder, bipolar type ([place]) 10/04/2019  . Mixed hyperlipidemia 10/04/2019  . Vitamin D deficiency 10/04/2019  . Mild neurocognitive disorder, unclear etiology 09/08/2019  . Major depressive disorder   .  Female pelvic peritoneal adhesions 12/19/2018  . Galactorrhea not associated with childbirth 12/19/2018  . Heart disease 12/19/2018  . Class 3 severe obesity due to  excess calories with serious comorbidity and body mass index (BMI) of 40.0 to 44.9 in adult ([place]) 12/19/2018  . Pain in pelvis 12/19/2018  . Oropharyngeal dysphagia 03/01/2018  . Weakness 09/11/2016  . Seizure disorder ([place]) 12/30/2013  . Disorder of sacrum 11/03/2013  . Hiatal hernia 11/03/2013  . Hypertension 11/03/2013  . Intractable migraine with aura 11/03/2013  . Lumbosacral spondylosis 11/03/2013  . Multiple sclerosis ([place]) 11/03/2013  . Fibromyalgia 11/03/2013  . DDD (degenerative disc disease), lumbosacral 10/03/2013  . Thyroid disease 10/03/2013  . Lumbar radiculopathy 08/30/2012  . Generalized anxiety disorder 04/05/2012  . Elevated LFTs 10/15/2010  . Abdominal pain 09/27/2009  . Asthma, moderate persistent 06/04/2009  . Vocal cord disorder 04/02/2009  . Personal history of allergy to seafood 10/10/2008  . Allergic rhinitis 09/20/2008  . ALLERGY TO INSECTS AND ARACHNIDS 09/20/2008  . Dysphagia, pharyngoesophageal phase 09/12/2008  . GERD (gastroesophageal reflux disease) 07/05/2008  . Bipolar I disorder ([place]) 06/13/2008  . OSA (obstructive sleep apnea) 06/13/2008  . Arthritis 06/13/2008  . Chronic back pain 06/13/2008  . Hyperglycemia 06/13/2008   PCP:  Billie Ruddy, MD Pharmacy:   [facility] [place], [place] [address] Phone: [phone] Fax: [phone]     Social Determinants of Health (SDOH) Interventions    Readmission Risk Interventions No flowsheet data found.

## 2020-07-17 NOTE — Telephone Encounter (Signed)
+   desats. Continue on oxygen at bedtime per Rexene Edison  NP.

## 2020-07-17 NOTE — Evaluation (Signed)
Occupational Therapy Evaluation Patient Details Name: [name] MRN: [id number] DOB: Apr 14, 1961 Today's Date: 07/17/2020    History of Present Illness 60 y.o. female with PMH of multiple sclerosis, OSA, bipolar D/O, anxiety, seizure d/o, fibromyalgia who sustained a L fibula fx on 07/08/20, she is now s/p ORIF on 07/16/20.   Clinical Impression   Ms. [name] is a 60 year old woman with above medical history who presents with complaints of pain in left ankle and left shoulder, generalized weakness, decreased activity tolerance, impaired balance and NWB status of LLE resulting in impaired ability to perform baseline ADLs and mobility. Patient min assist for supine to sit, min guard to perform sit to stand with use of stedy from high surface and total assist (due to equipment) to be transferred to recliner. Patient needing more assistance for ADLs than her baseline due to inability to stand on her own, weight bearing status and pain. Patient will benefit from skilled OT services while in hospital to improve deficits and learn compensatory strategies as needed in order to return PLOF.  Recommend short term rehab at discharge.      Follow Up Recommendations  SNF    Equipment Recommendations       Recommendations for Other Services       Precautions / Restrictions Precautions Precautions: Fall Precaution Comments: pt reports h/o 8 falls in past 1 year, several were after a recent cataract surgery when she had blurry vision Restrictions Weight Bearing Restrictions: Yes LLE Weight Bearing: Non weight bearing      Mobility Bed Mobility Overal bed mobility: Needs Assistance Bed Mobility: Rolling;Sidelying to Sit Rolling: Min assist Sidelying to sit: Min assist       General bed mobility comments: min A to support LLE    Transfers Overall transfer level: Needs assistance   Transfers: Sit to/from Stand;Stand Pivot Transfers Sit to Stand: +2 safety/equipment;From  elevated surface;Min guard Stand pivot transfers: Total assist       General transfer comment: sit to stand with Stedy x 3 trials (once from highly elevated bed, twice from seat on stedy for pericare) , SPT bed to recliner using stedy. Pt did well maintaining NWB status on LLE, heavy reliance on BUEs to pull up to stand. Verbal cues for hand placement    Balance Overall balance assessment: Needs assistance Sitting-balance support: Feet supported Sitting balance-Leahy Scale: Fair     Standing balance support: Bilateral upper extremity supported Standing balance-Leahy Scale: Poor Standing balance comment: able to maintain standing for ~30 seconds with BUE support on Stedy and maintaining LLE NWB                           ADL either performed or assessed with clinical judgement   ADL Overall ADL's : Needs assistance/impaired Eating/Feeding: Independent   Grooming: Set up;Sitting   Upper Body Bathing: Set up;Sitting   Lower Body Bathing: Maximal assistance;Sitting/lateral leans   Upper Body Dressing : Sitting;Minimal assistance   Lower Body Dressing: Total assistance;Sitting/lateral leans   Toilet Transfer: +2 for safety/equipment;+2 for physical assistance;Total assistance Toilet Transfer Details (indicate cue type and reason): use of sara stedy for transfer to [facility] (as exhibited with transfer to recliner) Toileting- Clothing Manipulation and Hygiene: Total assistance               Vision Patient Visual Report: No change from baseline       Perception     Praxis  Pertinent Vitals/Pain Pain Assessment: 0-10 Pain Score: 8  Pain Location: L ankle with movement Pain Descriptors / Indicators: Sore;Grimacing Pain Intervention(s): Limited activity within patient's tolerance;Monitored during session     Hand Dominance Right   Extremity/Trunk Assessment Upper Extremity Assessment Upper Extremity Assessment: Overall WFL for tasks assessed;LUE  deficits/detail LUE Deficits / Details: Reports pain in left shoulder, gross functional ROM, 4/5 strength shoulder, 5/5 elbow (did not test wrist due to IV placemenet), hand and lower arm swollen   Lower Extremity Assessment Lower Extremity Assessment: Defer to PT evaluation   Cervical / Trunk Assessment Cervical / Trunk Assessment: Normal   Communication Communication Communication: No difficulties   Cognition Arousal/Alertness: Awake/alert Behavior During Therapy: WFL for tasks assessed/performed Overall Cognitive Status: Within Functional Limits for tasks assessed                                 General Comments: very pleasant   General Comments       Exercises Other Exercises Other Exercises: educated on performing chair push ups to maintain and improve UB strength needing for transfers   Shoulder Instructions      Home Living Family/patient expects to be discharged to:: Skilled nursing facility   Available Help at Discharge: Family   Home Access: Stairs to enter Technical brewer of Steps: 3 Entrance Stairs-Rails: Left Home Layout: Two level Alternate Level Stairs-Number of Steps: 6 Alternate Level Stairs-Rails: Left           Home Equipment: Walker - 4 wheels;Cane - quad          Prior Functioning/Environment Level of Independence: Needs assistance  Gait / Transfers Assistance Needed: sister helped with stairs, pt would sit down to negotiate stairs, walked with quad cane at baseline, pt reports 8 falls in past 1 year ADL's / Homemaking Assistance Needed: sister helps with cooking, cleaning, bathing, dressing   Comments: sister lives in same home and is available 24*        OT Problem List: Decreased strength;Decreased activity tolerance;Impaired balance (sitting and/or standing);Decreased knowledge of use of DME or AE;Decreased knowledge of precautions;Pain;Obesity      OT Treatment/Interventions: Self-care/ADL training;Therapeutic  exercise;DME and/or AE instruction;Therapeutic activities;Balance training;Patient/family education    OT Goals(Current goals can be found in the care plan section) Acute Rehab OT Goals Patient Stated Goal: to be safe going home, agreeable to ST-SNF or CIR, loves to read OT Goal Formulation: With patient Time For Goal Achievement: 07/31/20 Potential to Achieve Goals: Good  OT Frequency: Min 2X/week   Barriers to D/C: Inaccessible home environment          Co-evaluation PT/OT/SLP Co-Evaluation/Treatment: Yes Reason for Co-Treatment: For patient/therapist safety PT goals addressed during session: Mobility/safety with mobility OT goals addressed during session: ADL's and self-care      AM-PAC OT "6 Clicks" Daily Activity     Outcome Measure Help from another person eating meals?: None Help from another person taking care of personal grooming?: A Little Help from another person toileting, which includes using toliet, bedpan, or urinal?: Total Help from another person bathing (including washing, rinsing, drying)?: A Lot Help from another person to put on and taking off regular upper body clothing?: A Little Help from another person to put on and taking off regular lower body clothing?: Total 6 Click Score: 14   End of Session Equipment Utilized During Treatment: Gait belt;Rolling walker Nurse Communication: Mobility status  Activity Tolerance:  Patient tolerated treatment well Patient left: in chair;with call bell/phone within reach;with family/visitor present  OT Visit Diagnosis: Other abnormalities of gait and mobility (R26.89);Repeated falls (R29.6);Muscle weakness (generalized) (M62.81);Pain Pain - Right/Left: Left Pain - part of body: Ankle and joints of foot                Time: [id number] OT Time Calculation (min): 26 min Charges:  OT General Charges $OT Visit: 1 Visit OT Evaluation $OT Eval Moderate Complexity: 1 Mod  Quandra Fedorchak, OTR/L [place]  Office  [phone] Pager: [place] 07/17/2020, 2:00 PM

## 2020-07-17 NOTE — Care Management Obs Status (Signed)
[place] NOTIFICATION   Patient Details  Name: [name] MRN: [id number] Date of Birth: Sep 02, 1960   Medicare Observation Status Notification Given:  Yes    Lia Hopping, [place] 07/17/2020, 3:47 PM

## 2020-07-17 NOTE — Progress Notes (Addendum)
Physical Therapy Treatment Patient Details Name: [name] MRN: [id number] DOB: 1961/05/10 Today's Date: 07/17/2020    History of Present Illness 60 y.o. female with PMH of multiple sclerosis, OSA, bipolar D/O, anxiety, seizure d/o, fibromyalgia who sustained a L fibula fx on 07/08/20, she is now s/p ORIF on 07/16/20.    PT Comments    Min assist for rolling and sidelying to sit. Sit to stand from highly elevated bed using Stedy lift equipment, with min/guard assist of 2 for safety. Pt maintained LLE NWB status well. Pt stood in [place] for ~ 30 seconds x 2 trials. Total of 3 sit to stand trials (one from bed, 2 from stedy). Pt puts forth good effort. She is a good CIR candidate. BP in sitting 167/97 prior to transfer (pt reported feeling anxious).    Follow Up Recommendations  SNF;CIR     Equipment Recommendations  Wheelchair cushion (measurements PT);Wheelchair (measurements PT)    Recommendations for Other Services OT consult, CIR consult     Precautions / Restrictions Precautions Precautions: Fall Precaution Comments: pt reports h/o 8 falls in past 1 year, several were after a recent cataract surgery when she had blurry vision Restrictions Weight Bearing Restrictions: Yes LLE Weight Bearing: Non weight bearing    Mobility  Bed Mobility Overal bed mobility: Needs Assistance Bed Mobility: Rolling;Sidelying to Sit Rolling: Min assist Sidelying to sit: Min assist       General bed mobility comments: min A to support LLE  Transfers Overall transfer level: Needs assistance   Transfers: Sit to/from Stand;Stand Pivot Transfers Sit to Stand: +2 safety/equipment;From elevated surface;Min guard Stand pivot transfers: Total assist       General transfer comment: sit to stand with Stedy x 3 trials (once from highly elevated bed, twice from seat on stedy for pericare) , SPT bed to recliner using stedy. Pt did well maintaining NWB status on LLE, heavy reliance on BUEs to  pull up to stand. Verbal cues for hand placement  Ambulation/Gait             General Gait Details: unable   Stairs             Wheelchair Mobility    Modified Rankin (Stroke Patients Only)       Balance Overall balance assessment: Needs assistance Sitting-balance support: Feet supported Sitting balance-Leahy Scale: Fair     Standing balance support: Bilateral upper extremity supported Standing balance-Leahy Scale: Poor Standing balance comment: able to maintain standing for ~30 seconds with BUE support on Stedy and maintaining LLE NWB                            Cognition Arousal/Alertness: Awake/alert Behavior During Therapy: WFL for tasks assessed/performed Overall Cognitive Status: Within Functional Limits for tasks assessed                                 General Comments: very pleasant      Exercises      General Comments        Pertinent Vitals/Pain Pain Score: 10-Worst pain ever Pain Location: L ankle with movement Pain Descriptors / Indicators: Sore;Grimacing Pain Intervention(s): Limited activity within patient's tolerance;Monitored during session;Premedicated before session;Repositioned    Home Living                      Prior Function  PT Goals (current goals can now be found in the care plan section) Acute Rehab PT Goals Patient Stated Goal: to be safe going home, agreeable to ST-SNF or CIR, loves to read PT Goal Formulation: With patient/family Time For Goal Achievement: 07/30/20 Potential to Achieve Goals: Good Progress towards PT goals: Progressing toward goals    Frequency    Min 3X/week      PT Plan Current plan remains appropriate    Co-evaluation PT/OT/SLP Co-Evaluation/Treatment: Yes Reason for Co-Treatment: Complexity of the patient's impairments (multi-system involvement);For patient/therapist safety;To address functional/ADL transfers PT goals addressed during  session: Mobility/safety with mobility;Balance;Proper use of DME        AM-PAC PT "6 Clicks" Mobility   Outcome Measure  Help needed turning from your back to your side while in a flat bed without using bedrails?: A Lot Help needed moving from lying on your back to sitting on the side of a flat bed without using bedrails?: A Lot Help needed moving to and from a bed to a chair (including a wheelchair)?: Total Help needed standing up from a chair using your arms (e.g., wheelchair or bedside chair)?: Total Help needed to walk in hospital room?: Total Help needed climbing 3-5 steps with a railing? : Total 6 Click Score: 8    End of Session Equipment Utilized During Treatment: Gait belt Activity Tolerance: Patient tolerated treatment well Patient left: with call bell/phone within reach;with family/visitor present;in chair Nurse Communication: Mobility status;Need for lift equipment PT Visit Diagnosis: Difficulty in walking, not elsewhere classified (R26.2);Pain;History of falling (Z91.81);Repeated falls (R29.6) Pain - Right/Left: Left Pain - part of body: Ankle and joints of foot     Time: 1009-1037 PT Time Calculation (min) (ACUTE ONLY): 28 min  Charges:  $Therapeutic Activity: 8-22 mins                    Blondell Reveal Kistler PT 07/17/2020  Acute Rehabilitation Services Pager [phone] Office [phone]

## 2020-07-17 NOTE — Addendum Note (Signed)
Addendum  created 07/17/20 1121 by Suan Halter, CRNA   Charge Capture section accepted

## 2020-07-17 NOTE — Progress Notes (Signed)
    Subjective: Patient reports pain as moderate.  Tolerating diet.  Urinating.  +Flatus.  No CP, SOB.   OOB with PT yesterday  Objective:   VITALS:   Vitals:   07/16/20 2101 07/16/20 2104 07/17/20 0144 07/17/20 0554  BP: (!) 165/113 130/81 129/77 136/72  Pulse: 76 78 71 71  Resp: 18 18 18 18   Temp: 98.4 F (36.9 C) 98.2 F (36.8 C) 98.4 F (36.9 C) 98.9 F (37.2 C)  TempSrc: Oral Oral Oral Oral  SpO2: 98% 98% 95% 97%  Weight:      Height:       CBC Latest Ref Rng & Units 07/16/2020 07/09/2020 04/24/2020  WBC 4.0 - 10.5 K/uL - 6.8 4.6  Hemoglobin 12.0 - 15.0 g/dL 12.9 13.3 13.4  Hematocrit 36.0 - 46.0 % 38.0 39.3 42.8  Platelets 150 - 400 K/uL - 306 304   BMP Latest Ref Rng & Units 07/16/2020 07/09/2020 04/24/2020  Glucose 70 - 99 mg/dL 133(H) 136(H) 88  BUN 6 - 20 mg/dL 15 13 11   Creatinine 0.44 - 1.00 mg/dL 0.70 1.04(H) 1.10(H)  BUN/Creat Ratio 6 - 22 (calc) - - 10  Sodium 135 - 145 mmol/L 140 138 140  Potassium 3.5 - 5.1 mmol/L 4.3 3.2(L) 3.9  Chloride 98 - 111 mmol/L 107 102 104  CO2 22 - 32 mmol/L - 23 29  Calcium 8.9 - 10.3 mg/dL - 9.3 9.2   Intake/Output      01/11 0701 01/12 0700 01/12 0701 01/13 0700   P.O. 2140    I.V. (mL/kg) 700 (5.7)    IV Piggyback 0    Total Intake(mL/kg) 2840 (22.9)    Urine (mL/kg/hr) 1200 (0.4)    Blood 2    Total Output 1202    Net +1638         Urine Occurrence 2 x       Physical Exam: General: NAD.  Resting in bed, comfortable. Resp: No increased wob Cardio: regular rate and rhythm ABD soft Neurologically intact MSK Neurovascularly intact Sensation intact distally Intact pulses distally Dorsiflexion/Plantar flexion intact Incision: dressing C/D/I Sensation intact distally  Assessment: 1 Day Post-Op  S/P Procedure(s) (LRB): OPEN REDUCTION INTERNAL FIXATION (ORIF) ANKLE FRACTURE (Left) by Dr. Ernesta Amble. Murphy on 07/16/20  Active Problems:   Open left ankle fracture     Plan:   Up with  therapy Incentive Spirometry Elevate and Apply ice  Weightbearing: NWB LLE Insicional and dressing care: Dressings left intact until follow-up Orthopedic device(s): None and Splint Showering: Keep dressing dry VTE prophylaxis: Aspirin 81mg  BID 30 days, SCDs, ambulation Pain control: Norco, Tylenol Follow - up plan: 2 weeks Contact information:  Edmonia Lynch MD, Margy Clarks PA-C  Dispo: TBD.     Rachael Fee, PA-C 07/17/2020, 7:49 AM

## 2020-07-18 DIAGNOSIS — R69 Illness, unspecified: Secondary | ICD-10-CM | POA: Diagnosis not present

## 2020-07-18 DIAGNOSIS — X58XXXA Exposure to other specified factors, initial encounter: Secondary | ICD-10-CM | POA: Diagnosis not present

## 2020-07-18 DIAGNOSIS — S82892B Other fracture of left lower leg, initial encounter for open fracture type I or II: Secondary | ICD-10-CM | POA: Diagnosis not present

## 2020-07-18 DIAGNOSIS — I10 Essential (primary) hypertension: Secondary | ICD-10-CM | POA: Diagnosis not present

## 2020-07-18 DIAGNOSIS — R5381 Other malaise: Secondary | ICD-10-CM | POA: Diagnosis not present

## 2020-07-18 DIAGNOSIS — Z87891 Personal history of nicotine dependence: Secondary | ICD-10-CM | POA: Diagnosis not present

## 2020-07-18 DIAGNOSIS — Z79899 Other long term (current) drug therapy: Secondary | ICD-10-CM | POA: Diagnosis not present

## 2020-07-18 DIAGNOSIS — Z743 Need for continuous supervision: Secondary | ICD-10-CM | POA: Diagnosis not present

## 2020-07-18 DIAGNOSIS — Z9981 Dependence on supplemental oxygen: Secondary | ICD-10-CM | POA: Diagnosis not present

## 2020-07-18 DIAGNOSIS — I519 Heart disease, unspecified: Secondary | ICD-10-CM | POA: Diagnosis not present

## 2020-07-18 DIAGNOSIS — S8262XA Displaced fracture of lateral malleolus of left fibula, initial encounter for closed fracture: Secondary | ICD-10-CM | POA: Diagnosis not present

## 2020-07-18 DIAGNOSIS — J45909 Unspecified asthma, uncomplicated: Secondary | ICD-10-CM | POA: Diagnosis not present

## 2020-07-18 DIAGNOSIS — Z7401 Bed confinement status: Secondary | ICD-10-CM | POA: Diagnosis not present

## 2020-07-18 DIAGNOSIS — M2550 Pain in unspecified joint: Secondary | ICD-10-CM | POA: Diagnosis not present

## 2020-07-18 DIAGNOSIS — G4733 Obstructive sleep apnea (adult) (pediatric): Secondary | ICD-10-CM | POA: Diagnosis not present

## 2020-07-18 MED ORDER — ASPIRIN 81 MG PO TBEC: 81.0000 mg | DELAYED_RELEASE_TABLET | Freq: Two times a day (BID) | ORAL | 11 refills | Status: DC

## 2020-07-18 MED ORDER — HYDROCODONE-ACETAMINOPHEN 5-325 MG PO TABS: 1.0000 | ORAL_TABLET | ORAL | 0 refills | Status: DC | PRN

## 2020-07-18 MED ORDER — ALBUTEROL SULFATE (2.5 MG/3ML) 0.083% IN NEBU: 3.0000 mL | INHALATION_SOLUTION | RESPIRATORY_TRACT | 12 refills | Status: DC | PRN

## 2020-07-18 MED ORDER — ONDANSETRON HCL 4 MG PO TABS: 4.0000 mg | ORAL_TABLET | Freq: Four times a day (QID) | ORAL | 0 refills | Status: DC | PRN

## 2020-07-18 NOTE — Progress Notes (Signed)
    Subjective: Patient reports pain as moderate.  Tolerating diet.  Urinating.  +Flatus.  No CP, SOB.   OOB with PT yesterday  Objective:   VITALS:   Vitals:   07/17/20 1743 07/17/20 1922 07/17/20 2207 07/18/20 0629  BP: (!) 157/84  124/90 140/76  Pulse: 69  73 70  Resp: 18  16 16   Temp: 98.3 F (36.8 C)  98.7 F (37.1 C) 98.4 F (36.9 C)  TempSrc: Oral  Oral Oral  SpO2: 97% 98% 96% 95%  Weight:      Height:       CBC Latest Ref Rng & Units 07/16/2020 07/09/2020 04/24/2020  WBC 4.0 - 10.5 K/uL - 6.8 4.6  Hemoglobin 12.0 - 15.0 g/dL 12.9 13.3 13.4  Hematocrit 36.0 - 46.0 % 38.0 39.3 42.8  Platelets 150 - 400 K/uL - 306 304   BMP Latest Ref Rng & Units 07/16/2020 07/09/2020 04/24/2020  Glucose 70 - 99 mg/dL 133(H) 136(H) 88  BUN 6 - 20 mg/dL 15 13 11   Creatinine 0.44 - 1.00 mg/dL 0.70 1.04(H) 1.10(H)  BUN/Creat Ratio 6 - 22 (calc) - - 10  Sodium 135 - 145 mmol/L 140 138 140  Potassium 3.5 - 5.1 mmol/L 4.3 3.2(L) 3.9  Chloride 98 - 111 mmol/L 107 102 104  CO2 22 - 32 mmol/L - 23 29  Calcium 8.9 - 10.3 mg/dL - 9.3 9.2   Intake/Output      01/12 0701 01/13 0700 01/13 0701 01/14 0700   P.O. 260    I.V. (mL/kg) 0 (0)    IV Piggyback 0    Total Intake(mL/kg) 260 (2.1)    Urine (mL/kg/hr) 300 (0.1)    Blood     Total Output 300    Net -40         Urine Occurrence 1 x       Physical Exam: General: NAD.  Resting in bed, comfortable. Resp: No increased wob Cardio: regular rate and rhythm ABD soft Neurologically intact MSK Neurovascularly intact Sensation intact distally Intact pulses distally Dorsiflexion/Plantar flexion intact Incision: dressing C/D/I Sensation intact distally  Assessment: 2 Days Post-Op  S/P Procedure(s) (LRB): OPEN REDUCTION INTERNAL FIXATION (ORIF) ANKLE FRACTURE (Left) by Dr. Ernesta Amble. Murphy on 07/16/20  Active Problems:   Open left ankle fracture     Plan:   Up with therapy Incentive Spirometry Elevate and Apply  ice  Weightbearing: NWB LLE Insicional and dressing care: Dressings left intact until follow-up Orthopedic device(s): None and Splint Showering: Keep dressing dry VTE prophylaxis: Aspirin 81mg  BID 30 days, SCDs, ambulation Pain control: Norco, Tylenol Follow - up plan: 2 weeks Contact information:  Edmonia Lynch MD, Margy Clarks PA-C  Dispo: Per SW note pt. Does not qualify for [facility]. Will order HHPT.  Plan for d/c to home with [place], PA-C 07/18/2020, 7:08 AM

## 2020-07-18 NOTE — Progress Notes (Signed)
Discussed with patient and family discharge instructions, they verbalized agreement and understanding.  Patient to leave in private vehicle with all belongings.   

## 2020-07-18 NOTE — TOC Transition Note (Addendum)
Transition of Care [facility]) - CM/SW Discharge Note   Patient Details  Name: [name] MRN: [id number] Date of Birth: August 13, 1960  Transition of Care [facility]) CM/SW Contact:  Lia Hopping, [place] Phone Number: 07/18/2020, 10:15 AM   Clinical Narrative:    CSW discussed d/c plan home with HHPT. [facility] to provide [place].  Order in for a 3 in1. Patient has requested a wc for safety. 3 IN1 and WC ordered.  Patient unable to get into her home safely due to stairs. Patient has requested [place] home.   CSW made a referral to [employer], waiting for DME to be delivered to bedside.   Addendum-[facility]AR aggranged to transport the patient     Final next level of care: [place] Barriers to Discharge: Barriers Resolved   Patient Goals and CMS Choice Patient states their goals for this hospitalization and ongoing recovery are:: walk without pain CMS Medicare.gov Compare Post Acute Care list provided to:: Patient Choice offered to / list presented to : Patient  Discharge Placement                Patient to be transferred to facility by: [place] Name of family member notified: Sister Patient and family notified of of transfer: 07/18/20  Discharge Plan and Services In-house Referral: Clinical Social Work Discharge Planning Services: [employer] Consult Post Acute Care Choice: [place]          DME Arranged: 3-N-1,Wheelchair manual DME Agency: [facility] Date DME Agency Contacted: 07/18/20   Representative spoke with at DME Agency: [place]: PT [place]: [facility] [employer]) Date [place]: 07/18/20 Time HH Agency Contacted: 1010 Representative spoke with at [place]: [place] ([place]) Interventions     Readmission Risk Interventions No flowsheet data found.

## 2020-07-19 NOTE — Discharge Summary (Signed)
Discharge Summary  Patient ID: [name] MRN: [id number] DOB/AGE: 01-29-1961 60 y.o.  Admit date: 07/16/2020 Discharge date: 07/19/2020  Admission Diagnoses:  Left ankle fracture  Discharge Diagnoses:  Active Problems:   Open left ankle fracture   Past Medical History:  Diagnosis Date  . Arthritis    oa  . Arthropathy, unspecified, site unspecified   . Asthma   . Backache, unspecified   . Bipolar I disorder 06/13/2008   Qualifier: Diagnosis of  By: Burt Knack CMA, Cecille Rubin    . Closed left ankle fracture   . Fibromyalgia   . Generalized anxiety disorder 04/05/2012  . GERD (gastroesophageal reflux disease) 07/05/2008   Qualifier: Diagnosis of  By: Joya Gaskins MD, Burnett Harry   . Hypertension   . Intractable migraine with aura 11/03/2013  . Lumbar radiculopathy 08/30/2012  . Major depressive disorder 09/08/2019  . Major depressive disorder, recurrent, severe with psychotic features 04/05/2012  . Mild neurocognitive disorder, unclear etiology 09/08/2019  . Multiple sclerosis 11/03/2013   remission at present(past hx. bedridden) -ambulates with cane-left side weaker  . Obstructive sleep apnea 06/13/2008   oxygen 2liters prn at hs  . On home oxygen therapy    nightly at 2l/m prn  . PONV (postoperative nausea and vomiting)   . Seizure disorder    last seizure 2014    Surgeries: Procedure(s): OPEN REDUCTION INTERNAL FIXATION (ORIF) ANKLE FRACTURE on 07/16/2020   Consultants (if any):   Discharged Condition: Improved  Hospital Course: [name] is an 60 y.o. female who was admitted 07/16/2020 with a diagnosis of left ankle fracture and went to the operating room on 07/16/2020 and underwent the above named procedures.     She was given perioperative antibiotics:  Anti-infectives (From admission, onward)   Start     Dose/Rate Route Frequency Ordered Stop   07/16/20 2000  vancomycin (VANCOCIN) IVPB 1000 mg/200 mL premix  Status:  Discontinued        1,000 mg 200 mL/hr over  60 Minutes Intravenous Every 12 hours 07/16/20 1747 07/16/20 1748   07/16/20 1930  vancomycin (VANCOCIN) IVPB 1000 mg/200 mL premix        1,000 mg 200 mL/hr over 60 Minutes Intravenous Every 12 hours 07/16/20 1555 07/17/20 1635   07/16/20 1430  vancomycin (VANCOCIN) IVPB 1000 mg/200 mL premix  Status:  Discontinued        1,000 mg 200 mL/hr over 60 Minutes Intravenous Every 12 hours 07/16/20 1416 07/16/20 1747   07/16/20 0700  vancomycin (VANCOREADY) IVPB 1500 mg/300 mL        1,500 mg 150 mL/hr over 120 Minutes Intravenous On call to O.R. 07/16/20 0645 07/16/20 1918    .  She was given sequential compression devices, early ambulation, and ASA 81mg  BID for DVT prophylaxis.  She benefited maximally from the hospital stay which was complicated by weakness and mobility issues due to multiple sclerosis.  Recent vital signs:  Vitals:   07/18/20 0859 07/18/20 1343  BP:  (!) 141/74  Pulse:  79  Resp:  17  Temp:  98.7 F (37.1 C)  SpO2: 95% 91%    Recent laboratory studies:  Lab Results  Component Value Date   HGB 12.9 07/16/2020   HGB 13.3 07/09/2020   HGB 13.4 04/24/2020   Lab Results  Component Value Date   WBC 6.8 07/09/2020   PLT 306 07/09/2020   Lab Results  Component Value Date   INR 0.99 12/25/2011   Lab Results  Component Value  Date   NA 140 07/16/2020   K 4.3 07/16/2020   CL 107 07/16/2020   CO2 23 07/09/2020   BUN 15 07/16/2020   CREATININE 0.70 07/16/2020   GLUCOSE 133 (H) 07/16/2020    Discharge Medications:   Allergies as of 07/18/2020      Reactions   Bee Venom Anaphylaxis   Depakote [divalproex Sodium] Shortness Of Breath, Rash   Haloperidol Lactate Swelling   [place]ornet Venom Anaphylaxis   Latex Anaphylaxis   mild rash/itching, wheezing/sob   Peanut-containing Drug Products Anaphylaxis   Penicillins Anaphylaxis, Swelling   Quetiapine Anaphylaxis, Other (See Comments)   Shellfish-derived Products Anaphylaxis   Valproic Acid Swelling    Amitriptyline Hcl Other (See Comments)   HALLUCINATIONS   Butorphanol Tartrate Other (See Comments)   Hallucinations   Gabapentin Other (See Comments)   Pt reports severe back and side pain   Iodine    Flushing and fainting   Pregabalin Rash   Terbutaline Rash      Medication List    TAKE these medications   acetaminophen 500 MG tablet Commonly known as: TYLENOL Take 1,000 mg by mouth every 6 (six) hours as needed for moderate pain.   albuterol 108 (90 Base) MCG/ACT inhaler Commonly known as: ProAir HFA INHALE 2 PUFFS BY MOUTH EVERY 4 HOURS AS NEEDED FOR WHEEZING OR SHORTNESS OF BREATH What changed:   how much to take  how to take this  when to take this  reasons to take this  additional instructions   albuterol (2.5 MG/3ML) 0.083% nebulizer solution Commonly known as: PROVENTIL Take 3 mLs by nebulization every 4 (four) hours as needed for wheezing or shortness of breath. What changed: You were already taking a medication with the same name, and this prescription was added. Make sure you understand how and when to take each.   aspirin EC 81 MG tablet Take 1 tablet (81 mg total) by mouth in the morning and at bedtime for 60 doses. Swallow whole.   aspirin 81 MG EC tablet Take 1 tablet (81 mg total) by mouth 2 (two) times daily. Swallow whole.   benztropine 0.5 MG tablet Commonly known as: COGENTIN Take 0.5 mg by mouth daily.   DUREZOL OP Place 1 drop into the right eye daily.   EPINEPHrine 0.3 mg/0.3 mL Soaj injection Commonly known as: EPI-PEN Inject 0.3 mg into the muscle daily as needed for anaphylaxis.   famotidine 20 MG tablet Commonly known as: Pepcid Take 1 tablet (20 mg total) by mouth 2 (two) times daily. For stomach acid. What changed:   when to take this  additional instructions   FLUoxetine 40 MG capsule Commonly known as: PROZAC Take 80 mg by mouth daily.   Fluticasone-Salmeterol 500-50 MCG/DOSE Aepb Commonly known as: ADVAIR Inhale 1  puff into the lungs 2 (two) times daily.   HYDROcodone-acetaminophen 5-325 MG tablet Commonly known as: NORCO/VICODIN Take 1 tablet by mouth every 4 (four) hours as needed for moderate pain. What changed:   how much to take  when to take this   HYDROcodone-acetaminophen 5-325 MG tablet Commonly known as: NORCO/VICODIN Take 1-2 tablets by mouth every 4 (four) hours as needed for moderate pain (pain score 4-6). What changed: You were already taking a medication with the same name, and this prescription was added. Make sure you understand how and when to take each.   hydrOXYzine 25 MG tablet Commonly known as: ATARAX/VISTARIL Take 25 mg by mouth 4 (four) times daily as needed for  anxiety or itching.   ibuprofen 600 MG tablet Commonly known as: ADVIL Take 600 mg by mouth 3 (three) times daily as needed for moderate pain.   lamoTRIgine 200 MG tablet Commonly known as: LAMICTAL Take 200 mg by mouth at bedtime.   metoprolol succinate 50 MG 24 hr tablet Commonly known as: TOPROL-XL Take 1 tablet (50 mg total) by mouth daily. Take with or immediately following a meal.   mirtazapine 15 MG tablet Commonly known as: REMERON Take 15 mg by mouth at bedtime.   multivitamin with minerals Tabs tablet Take 1 tablet by mouth daily. For nutritional supplementation. What changed: additional instructions   ondansetron 4 MG tablet Commonly known as: Zofran Take 1 tablet (4 mg total) by mouth daily as needed for nausea or vomiting.   ondansetron 4 MG tablet Commonly known as: ZOFRAN Take 1 tablet (4 mg total) by mouth every 6 (six) hours as needed for nausea.   polyethylene glycol 17 g packet Commonly known as: MIRALAX / GLYCOLAX Take 17 g by mouth daily as needed for mild constipation. What changed: when to take this   tiZANidine 4 MG tablet Commonly known as: ZANAFLEX Take 4 mg by mouth 3 (three) times daily as needed for muscle spasms.   Vitamin D (Ergocalciferol) 1.25 MG (50000  UNIT) Caps capsule Commonly known as: DRISDOL Take 1 capsule (50,000 Units total) by mouth every 7 (seven) days.   VITAMIN D PO Take 1 capsule by mouth daily.   ziprasidone 20 MG capsule Commonly known as: GEODON Take 20 mg by mouth 2 (two) times daily with a meal.       Diagnostic Studies: DG Ankle Complete Left  Result Date: 07/09/2020 CLINICAL DATA:  Left ankle pain after fall. EXAM: LEFT ANKLE COMPLETE - 3+ VIEW COMPARISON:  None. FINDINGS: Moderately displaced oblique fracture is seen involving the distal left fibula. Joint spaces are intact. No other bony abnormality is noted. IMPRESSION: Moderately displaced distal left fibular fracture. Electronically Signed   By: Marijo Conception M.D.   On: 07/09/2020 15:55   DG Shoulder Left  Result Date: 07/09/2020 CLINICAL DATA:  Left shoulder pain after fall. EXAM: LEFT SHOULDER - 2+ VIEW COMPARISON:  None. FINDINGS: There is no evidence of fracture or dislocation. There is no evidence of arthropathy or other focal bone abnormality. Soft tissues are unremarkable. IMPRESSION: Negative. Electronically Signed   By: Marijo Conception M.D.   On: 07/09/2020 15:56    Disposition: Discharge disposition: 06-Home-Health Care Svc          Follow-up Information    Renette Butters, MD In 2 weeks.   Specialty: Orthopedic Surgery Contact information: [address] [place] [place] [address] [phone]        [facility] up.   Specialty: Home Health Services Why: This agency will provide Physical therapy at Home. The agency will contact you prior to the first visit.  Contact information: [address] [place] [address] [phone]                Signed: Rachael Fee PA-C 07/19/2020, 7:36 AM

## 2020-07-20 DIAGNOSIS — D649 Anemia, unspecified: Secondary | ICD-10-CM | POA: Diagnosis not present

## 2020-07-20 DIAGNOSIS — G4733 Obstructive sleep apnea (adult) (pediatric): Secondary | ICD-10-CM | POA: Diagnosis not present

## 2020-07-20 DIAGNOSIS — M5416 Radiculopathy, lumbar region: Secondary | ICD-10-CM | POA: Diagnosis not present

## 2020-07-20 DIAGNOSIS — G43119 Migraine with aura, intractable, without status migrainosus: Secondary | ICD-10-CM | POA: Diagnosis not present

## 2020-07-20 DIAGNOSIS — Z7982 Long term (current) use of aspirin: Secondary | ICD-10-CM | POA: Diagnosis not present

## 2020-07-20 DIAGNOSIS — J45909 Unspecified asthma, uncomplicated: Secondary | ICD-10-CM | POA: Diagnosis not present

## 2020-07-20 DIAGNOSIS — Z7951 Long term (current) use of inhaled steroids: Secondary | ICD-10-CM | POA: Diagnosis not present

## 2020-07-20 DIAGNOSIS — M1991 Primary osteoarthritis, unspecified site: Secondary | ICD-10-CM | POA: Diagnosis not present

## 2020-07-20 DIAGNOSIS — Z87891 Personal history of nicotine dependence: Secondary | ICD-10-CM | POA: Diagnosis not present

## 2020-07-20 DIAGNOSIS — R69 Illness, unspecified: Secondary | ICD-10-CM | POA: Diagnosis not present

## 2020-07-20 DIAGNOSIS — K219 Gastro-esophageal reflux disease without esophagitis: Secondary | ICD-10-CM | POA: Diagnosis not present

## 2020-07-20 DIAGNOSIS — Z4789 Encounter for other orthopedic aftercare: Secondary | ICD-10-CM | POA: Diagnosis not present

## 2020-07-20 DIAGNOSIS — S82842D Displaced bimalleolar fracture of left lower leg, subsequent encounter for closed fracture with routine healing: Secondary | ICD-10-CM | POA: Diagnosis not present

## 2020-07-20 DIAGNOSIS — Z9181 History of falling: Secondary | ICD-10-CM | POA: Diagnosis not present

## 2020-07-20 DIAGNOSIS — M797 Fibromyalgia: Secondary | ICD-10-CM | POA: Diagnosis not present

## 2020-07-20 DIAGNOSIS — I10 Essential (primary) hypertension: Secondary | ICD-10-CM | POA: Diagnosis not present

## 2020-07-20 DIAGNOSIS — M25512 Pain in left shoulder: Secondary | ICD-10-CM | POA: Diagnosis not present

## 2020-07-20 DIAGNOSIS — G40909 Epilepsy, unspecified, not intractable, without status epilepticus: Secondary | ICD-10-CM | POA: Diagnosis not present

## 2020-07-20 DIAGNOSIS — G35 Multiple sclerosis: Secondary | ICD-10-CM | POA: Diagnosis not present

## 2020-07-20 DIAGNOSIS — Z9981 Dependence on supplemental oxygen: Secondary | ICD-10-CM | POA: Diagnosis not present

## 2020-07-25 DIAGNOSIS — M797 Fibromyalgia: Secondary | ICD-10-CM | POA: Diagnosis not present

## 2020-07-25 DIAGNOSIS — M25512 Pain in left shoulder: Secondary | ICD-10-CM | POA: Diagnosis not present

## 2020-07-25 DIAGNOSIS — Z7982 Long term (current) use of aspirin: Secondary | ICD-10-CM | POA: Diagnosis not present

## 2020-07-25 DIAGNOSIS — G35 Multiple sclerosis: Secondary | ICD-10-CM | POA: Diagnosis not present

## 2020-07-25 DIAGNOSIS — M1991 Primary osteoarthritis, unspecified site: Secondary | ICD-10-CM | POA: Diagnosis not present

## 2020-07-25 DIAGNOSIS — K219 Gastro-esophageal reflux disease without esophagitis: Secondary | ICD-10-CM | POA: Diagnosis not present

## 2020-07-25 DIAGNOSIS — Z87891 Personal history of nicotine dependence: Secondary | ICD-10-CM | POA: Diagnosis not present

## 2020-07-25 DIAGNOSIS — D649 Anemia, unspecified: Secondary | ICD-10-CM | POA: Diagnosis not present

## 2020-07-25 DIAGNOSIS — I10 Essential (primary) hypertension: Secondary | ICD-10-CM | POA: Diagnosis not present

## 2020-07-25 DIAGNOSIS — R69 Illness, unspecified: Secondary | ICD-10-CM | POA: Diagnosis not present

## 2020-07-25 DIAGNOSIS — S82842D Displaced bimalleolar fracture of left lower leg, subsequent encounter for closed fracture with routine healing: Secondary | ICD-10-CM | POA: Diagnosis not present

## 2020-07-25 DIAGNOSIS — Z9181 History of falling: Secondary | ICD-10-CM | POA: Diagnosis not present

## 2020-07-25 DIAGNOSIS — Z7951 Long term (current) use of inhaled steroids: Secondary | ICD-10-CM | POA: Diagnosis not present

## 2020-07-25 DIAGNOSIS — M5416 Radiculopathy, lumbar region: Secondary | ICD-10-CM | POA: Diagnosis not present

## 2020-07-25 DIAGNOSIS — G40909 Epilepsy, unspecified, not intractable, without status epilepticus: Secondary | ICD-10-CM | POA: Diagnosis not present

## 2020-07-25 DIAGNOSIS — G4733 Obstructive sleep apnea (adult) (pediatric): Secondary | ICD-10-CM | POA: Diagnosis not present

## 2020-07-25 DIAGNOSIS — J45909 Unspecified asthma, uncomplicated: Secondary | ICD-10-CM | POA: Diagnosis not present

## 2020-07-25 DIAGNOSIS — G43119 Migraine with aura, intractable, without status migrainosus: Secondary | ICD-10-CM | POA: Diagnosis not present

## 2020-07-25 DIAGNOSIS — Z9981 Dependence on supplemental oxygen: Secondary | ICD-10-CM | POA: Diagnosis not present

## 2020-07-29 ENCOUNTER — Other Ambulatory Visit: Payer: Self-pay | Admitting: Family Medicine

## 2020-07-29 DIAGNOSIS — S82842D Displaced bimalleolar fracture of left lower leg, subsequent encounter for closed fracture with routine healing: Secondary | ICD-10-CM | POA: Diagnosis not present

## 2020-07-29 DIAGNOSIS — E559 Vitamin D deficiency, unspecified: Secondary | ICD-10-CM

## 2020-07-30 ENCOUNTER — Other Ambulatory Visit: Payer: Self-pay

## 2020-07-30 DIAGNOSIS — G35 Multiple sclerosis: Secondary | ICD-10-CM | POA: Diagnosis not present

## 2020-07-30 DIAGNOSIS — J45909 Unspecified asthma, uncomplicated: Secondary | ICD-10-CM | POA: Diagnosis not present

## 2020-07-30 DIAGNOSIS — G4733 Obstructive sleep apnea (adult) (pediatric): Secondary | ICD-10-CM | POA: Diagnosis not present

## 2020-07-30 DIAGNOSIS — Z87891 Personal history of nicotine dependence: Secondary | ICD-10-CM | POA: Diagnosis not present

## 2020-07-30 DIAGNOSIS — I10 Essential (primary) hypertension: Secondary | ICD-10-CM | POA: Diagnosis not present

## 2020-07-30 DIAGNOSIS — M797 Fibromyalgia: Secondary | ICD-10-CM | POA: Diagnosis not present

## 2020-07-30 DIAGNOSIS — R69 Illness, unspecified: Secondary | ICD-10-CM | POA: Diagnosis not present

## 2020-07-30 DIAGNOSIS — S82842D Displaced bimalleolar fracture of left lower leg, subsequent encounter for closed fracture with routine healing: Secondary | ICD-10-CM | POA: Diagnosis not present

## 2020-07-30 DIAGNOSIS — Z9181 History of falling: Secondary | ICD-10-CM | POA: Diagnosis not present

## 2020-07-30 DIAGNOSIS — K219 Gastro-esophageal reflux disease without esophagitis: Secondary | ICD-10-CM | POA: Diagnosis not present

## 2020-07-30 DIAGNOSIS — M1991 Primary osteoarthritis, unspecified site: Secondary | ICD-10-CM | POA: Diagnosis not present

## 2020-07-30 DIAGNOSIS — G40909 Epilepsy, unspecified, not intractable, without status epilepticus: Secondary | ICD-10-CM | POA: Diagnosis not present

## 2020-07-30 DIAGNOSIS — Z7982 Long term (current) use of aspirin: Secondary | ICD-10-CM | POA: Diagnosis not present

## 2020-07-30 DIAGNOSIS — D649 Anemia, unspecified: Secondary | ICD-10-CM | POA: Diagnosis not present

## 2020-07-30 DIAGNOSIS — Z9981 Dependence on supplemental oxygen: Secondary | ICD-10-CM | POA: Diagnosis not present

## 2020-07-30 DIAGNOSIS — M25512 Pain in left shoulder: Secondary | ICD-10-CM | POA: Diagnosis not present

## 2020-07-30 DIAGNOSIS — Z7951 Long term (current) use of inhaled steroids: Secondary | ICD-10-CM | POA: Diagnosis not present

## 2020-07-30 DIAGNOSIS — G43119 Migraine with aura, intractable, without status migrainosus: Secondary | ICD-10-CM | POA: Diagnosis not present

## 2020-07-30 DIAGNOSIS — M5416 Radiculopathy, lumbar region: Secondary | ICD-10-CM | POA: Diagnosis not present

## 2020-07-31 ENCOUNTER — Telehealth: Payer: Self-pay | Admitting: Family Medicine

## 2020-07-31 ENCOUNTER — Ambulatory Visit: Payer: Medicare HMO | Admitting: Family Medicine

## 2020-07-31 NOTE — Telephone Encounter (Signed)
cyclobenzaprine (FLEXERIL) 10 MG tablet    ibuprofen (ADVIL) 600 MG tablet    [id number] - [place], [place] - [address] AT [address] Phone:  [phone]  Fax:  [phone]

## 2020-08-01 DIAGNOSIS — R69 Illness, unspecified: Secondary | ICD-10-CM | POA: Diagnosis not present

## 2020-08-05 ENCOUNTER — Telehealth: Payer: Medicare HMO | Admitting: Family Medicine

## 2020-08-05 NOTE — Progress Notes (Signed)
Pt called for phone visit, but she did not answer the phone.  Will try again shortly.  Lockie Pares, MD

## 2020-08-05 NOTE — Telephone Encounter (Signed)
Pt has an appointment with Dr Volanda Napoleon this afternoon, will discuss refill then

## 2020-08-06 DIAGNOSIS — Z87891 Personal history of nicotine dependence: Secondary | ICD-10-CM | POA: Diagnosis not present

## 2020-08-06 DIAGNOSIS — M797 Fibromyalgia: Secondary | ICD-10-CM | POA: Diagnosis not present

## 2020-08-06 DIAGNOSIS — S82842D Displaced bimalleolar fracture of left lower leg, subsequent encounter for closed fracture with routine healing: Secondary | ICD-10-CM | POA: Diagnosis not present

## 2020-08-06 DIAGNOSIS — D649 Anemia, unspecified: Secondary | ICD-10-CM | POA: Diagnosis not present

## 2020-08-06 DIAGNOSIS — G4733 Obstructive sleep apnea (adult) (pediatric): Secondary | ICD-10-CM | POA: Diagnosis not present

## 2020-08-06 DIAGNOSIS — Z9981 Dependence on supplemental oxygen: Secondary | ICD-10-CM | POA: Diagnosis not present

## 2020-08-06 DIAGNOSIS — Z7982 Long term (current) use of aspirin: Secondary | ICD-10-CM | POA: Diagnosis not present

## 2020-08-06 DIAGNOSIS — G40909 Epilepsy, unspecified, not intractable, without status epilepticus: Secondary | ICD-10-CM | POA: Diagnosis not present

## 2020-08-06 DIAGNOSIS — I10 Essential (primary) hypertension: Secondary | ICD-10-CM | POA: Diagnosis not present

## 2020-08-06 DIAGNOSIS — Z9181 History of falling: Secondary | ICD-10-CM | POA: Diagnosis not present

## 2020-08-06 DIAGNOSIS — M25512 Pain in left shoulder: Secondary | ICD-10-CM | POA: Diagnosis not present

## 2020-08-06 DIAGNOSIS — K219 Gastro-esophageal reflux disease without esophagitis: Secondary | ICD-10-CM | POA: Diagnosis not present

## 2020-08-06 DIAGNOSIS — G35 Multiple sclerosis: Secondary | ICD-10-CM | POA: Diagnosis not present

## 2020-08-06 DIAGNOSIS — J45909 Unspecified asthma, uncomplicated: Secondary | ICD-10-CM | POA: Diagnosis not present

## 2020-08-06 DIAGNOSIS — R69 Illness, unspecified: Secondary | ICD-10-CM | POA: Diagnosis not present

## 2020-08-06 DIAGNOSIS — M1991 Primary osteoarthritis, unspecified site: Secondary | ICD-10-CM | POA: Diagnosis not present

## 2020-08-06 DIAGNOSIS — M5416 Radiculopathy, lumbar region: Secondary | ICD-10-CM | POA: Diagnosis not present

## 2020-08-06 DIAGNOSIS — G43119 Migraine with aura, intractable, without status migrainosus: Secondary | ICD-10-CM | POA: Diagnosis not present

## 2020-08-06 DIAGNOSIS — Z7951 Long term (current) use of inhaled steroids: Secondary | ICD-10-CM | POA: Diagnosis not present

## 2020-08-07 ENCOUNTER — Telehealth (INDEPENDENT_AMBULATORY_CARE_PROVIDER_SITE_OTHER): Payer: Medicare HMO | Admitting: Family Medicine

## 2020-08-07 ENCOUNTER — Encounter: Payer: Self-pay | Admitting: Family Medicine

## 2020-08-07 DIAGNOSIS — Z8781 Personal history of (healed) traumatic fracture: Secondary | ICD-10-CM | POA: Diagnosis not present

## 2020-08-07 DIAGNOSIS — K5903 Drug induced constipation: Secondary | ICD-10-CM | POA: Diagnosis not present

## 2020-08-07 DIAGNOSIS — M25572 Pain in left ankle and joints of left foot: Secondary | ICD-10-CM

## 2020-08-07 DIAGNOSIS — R195 Other fecal abnormalities: Secondary | ICD-10-CM | POA: Diagnosis not present

## 2020-08-07 DIAGNOSIS — Z9889 Other specified postprocedural states: Secondary | ICD-10-CM | POA: Diagnosis not present

## 2020-08-07 MED ORDER — POLYETHYLENE GLYCOL 3350 17 GM/SCOOP PO POWD: 17.0000 g | Freq: Every day | ORAL | 3 refills | Status: AC | PRN

## 2020-08-07 MED ORDER — IBUPROFEN 600 MG PO TABS: 600.0000 mg | ORAL_TABLET | Freq: Three times a day (TID) | ORAL | 1 refills | Status: DC | PRN

## 2020-08-07 NOTE — Progress Notes (Signed)
Virtual Visit via Telephone Note  I connected with [name] on 08/07/20 at  3:00 PM EST by telephone and verified that I am speaking with the correct person using two identifiers.   I discussed the limitations, risks, security and privacy concerns of performing an evaluation and management service by telephone and the availability of in person appointments. I also discussed with the patient that there may be a patient responsible charge related to this service. The patient expressed understanding and agreed to proceed.  Location patient: home Location provider: work or home office Participants present for the call: patient, provider Patient did not have a visit in the prior 7 days to address this/these issue(s).   History of Present Illness: Pt fell and broke her ankle in 2 places.  Required surgery.  On hydrocodone for pain.  Pt with continued pain despite tylenol arthritis and ice.  Elevating LE and working with [facility].    Last BM was Sunday.  Pt taking a MVI with iron for several months. Denies feeling weak or dizziness.  Pt had a "stomach ache", n, and dry heaves on Monday.  Endorses black stool and BRBPR on tissue when wiped.  Pt with hemorrhoids in the past.  Denies current bleeding.   Observations/Objective: Patient sounds cheerful and well on the phone. I do not appreciate any SOB. Speech and thought processing are grossly intact. Patient reported vitals:  Assessment and Plan: Status post open reduction with internal fixation (ORIF) of fracture of ankle -Continue follow-up with Ortho -Continue home health PT  Acute left ankle pain  -s/p ORIF of L ankle -Discussed supportive care. -Given increased drowsiness with a whole tablet of Norco , pt to try half a tab Norco 5-325 mg. -Discussed alternating with Tylenol 325 mg if needed -Given recent dark stool and BRBPR patient advised opioid NSAID use -continue [place] PT -Encouraged to keep follow-up appointment on 08/14/2020 with  Ortho  Drug-induced constipation -Constipation likely 2/2 Norco use -Patient encouraged to increase p.o. intake with water -Can use MiraLAX daily if needed  Dark stools -Discussed possible causes including upper GIB -We will obtain labs this week -Patient given strict precautions -Plan: CBC with differential, iron studies   Follow Up Instructions: F/u prn  [id number] [id number] [id number] 11-20 [id number] 21-30 I did not refer this patient for an OV in the next 24 hours for this/these issue(s).  I discussed the assessment and treatment plan with the patient. The patient was provided an opportunity to ask questions and all were answered. The patient agreed with the plan and demonstrated an understanding of the instructions.   The patient was advised to call back or seek an in-person evaluation if the symptoms worsen or if the condition fails to improve as anticipated.  I provided 20:36 minutes of non-face-to-face time during this encounter.   Billie Ruddy, MD

## 2020-08-08 DIAGNOSIS — D649 Anemia, unspecified: Secondary | ICD-10-CM | POA: Diagnosis not present

## 2020-08-08 DIAGNOSIS — I10 Essential (primary) hypertension: Secondary | ICD-10-CM | POA: Diagnosis not present

## 2020-08-08 DIAGNOSIS — G40909 Epilepsy, unspecified, not intractable, without status epilepticus: Secondary | ICD-10-CM | POA: Diagnosis not present

## 2020-08-08 DIAGNOSIS — G4733 Obstructive sleep apnea (adult) (pediatric): Secondary | ICD-10-CM | POA: Diagnosis not present

## 2020-08-08 DIAGNOSIS — S82842D Displaced bimalleolar fracture of left lower leg, subsequent encounter for closed fracture with routine healing: Secondary | ICD-10-CM | POA: Diagnosis not present

## 2020-08-08 DIAGNOSIS — K219 Gastro-esophageal reflux disease without esophagitis: Secondary | ICD-10-CM | POA: Diagnosis not present

## 2020-08-08 DIAGNOSIS — M797 Fibromyalgia: Secondary | ICD-10-CM | POA: Diagnosis not present

## 2020-08-08 DIAGNOSIS — Z9181 History of falling: Secondary | ICD-10-CM | POA: Diagnosis not present

## 2020-08-08 DIAGNOSIS — J45909 Unspecified asthma, uncomplicated: Secondary | ICD-10-CM | POA: Diagnosis not present

## 2020-08-08 DIAGNOSIS — G43119 Migraine with aura, intractable, without status migrainosus: Secondary | ICD-10-CM | POA: Diagnosis not present

## 2020-08-08 DIAGNOSIS — G35 Multiple sclerosis: Secondary | ICD-10-CM | POA: Diagnosis not present

## 2020-08-08 DIAGNOSIS — Z87891 Personal history of nicotine dependence: Secondary | ICD-10-CM | POA: Diagnosis not present

## 2020-08-08 DIAGNOSIS — M1991 Primary osteoarthritis, unspecified site: Secondary | ICD-10-CM | POA: Diagnosis not present

## 2020-08-08 DIAGNOSIS — Z7951 Long term (current) use of inhaled steroids: Secondary | ICD-10-CM | POA: Diagnosis not present

## 2020-08-08 DIAGNOSIS — Z7982 Long term (current) use of aspirin: Secondary | ICD-10-CM | POA: Diagnosis not present

## 2020-08-08 DIAGNOSIS — M25512 Pain in left shoulder: Secondary | ICD-10-CM | POA: Diagnosis not present

## 2020-08-08 DIAGNOSIS — M5416 Radiculopathy, lumbar region: Secondary | ICD-10-CM | POA: Diagnosis not present

## 2020-08-08 DIAGNOSIS — Z9981 Dependence on supplemental oxygen: Secondary | ICD-10-CM | POA: Diagnosis not present

## 2020-08-08 DIAGNOSIS — R69 Illness, unspecified: Secondary | ICD-10-CM | POA: Diagnosis not present

## 2020-08-13 DIAGNOSIS — R69 Illness, unspecified: Secondary | ICD-10-CM | POA: Diagnosis not present

## 2020-08-13 DIAGNOSIS — D649 Anemia, unspecified: Secondary | ICD-10-CM | POA: Diagnosis not present

## 2020-08-13 DIAGNOSIS — I10 Essential (primary) hypertension: Secondary | ICD-10-CM | POA: Diagnosis not present

## 2020-08-13 DIAGNOSIS — Z9981 Dependence on supplemental oxygen: Secondary | ICD-10-CM | POA: Diagnosis not present

## 2020-08-13 DIAGNOSIS — G43119 Migraine with aura, intractable, without status migrainosus: Secondary | ICD-10-CM | POA: Diagnosis not present

## 2020-08-13 DIAGNOSIS — M797 Fibromyalgia: Secondary | ICD-10-CM | POA: Diagnosis not present

## 2020-08-13 DIAGNOSIS — Z7951 Long term (current) use of inhaled steroids: Secondary | ICD-10-CM | POA: Diagnosis not present

## 2020-08-13 DIAGNOSIS — M1991 Primary osteoarthritis, unspecified site: Secondary | ICD-10-CM | POA: Diagnosis not present

## 2020-08-13 DIAGNOSIS — J45909 Unspecified asthma, uncomplicated: Secondary | ICD-10-CM | POA: Diagnosis not present

## 2020-08-13 DIAGNOSIS — Z87891 Personal history of nicotine dependence: Secondary | ICD-10-CM | POA: Diagnosis not present

## 2020-08-13 DIAGNOSIS — Z9181 History of falling: Secondary | ICD-10-CM | POA: Diagnosis not present

## 2020-08-13 DIAGNOSIS — K219 Gastro-esophageal reflux disease without esophagitis: Secondary | ICD-10-CM | POA: Diagnosis not present

## 2020-08-13 DIAGNOSIS — Z7982 Long term (current) use of aspirin: Secondary | ICD-10-CM | POA: Diagnosis not present

## 2020-08-13 DIAGNOSIS — G35 Multiple sclerosis: Secondary | ICD-10-CM | POA: Diagnosis not present

## 2020-08-13 DIAGNOSIS — M5416 Radiculopathy, lumbar region: Secondary | ICD-10-CM | POA: Diagnosis not present

## 2020-08-13 DIAGNOSIS — S82842D Displaced bimalleolar fracture of left lower leg, subsequent encounter for closed fracture with routine healing: Secondary | ICD-10-CM | POA: Diagnosis not present

## 2020-08-13 DIAGNOSIS — G40909 Epilepsy, unspecified, not intractable, without status epilepticus: Secondary | ICD-10-CM | POA: Diagnosis not present

## 2020-08-13 DIAGNOSIS — G4733 Obstructive sleep apnea (adult) (pediatric): Secondary | ICD-10-CM | POA: Diagnosis not present

## 2020-08-13 DIAGNOSIS — M25512 Pain in left shoulder: Secondary | ICD-10-CM | POA: Diagnosis not present

## 2020-08-14 DIAGNOSIS — M25572 Pain in left ankle and joints of left foot: Secondary | ICD-10-CM | POA: Diagnosis not present

## 2020-08-14 DIAGNOSIS — M25512 Pain in left shoulder: Secondary | ICD-10-CM | POA: Diagnosis not present

## 2020-08-14 DIAGNOSIS — S82842D Displaced bimalleolar fracture of left lower leg, subsequent encounter for closed fracture with routine healing: Secondary | ICD-10-CM | POA: Diagnosis not present

## 2020-08-17 DIAGNOSIS — G43119 Migraine with aura, intractable, without status migrainosus: Secondary | ICD-10-CM | POA: Diagnosis not present

## 2020-08-17 DIAGNOSIS — M1991 Primary osteoarthritis, unspecified site: Secondary | ICD-10-CM | POA: Diagnosis not present

## 2020-08-17 DIAGNOSIS — I10 Essential (primary) hypertension: Secondary | ICD-10-CM | POA: Diagnosis not present

## 2020-08-17 DIAGNOSIS — M5416 Radiculopathy, lumbar region: Secondary | ICD-10-CM | POA: Diagnosis not present

## 2020-08-17 DIAGNOSIS — Z9181 History of falling: Secondary | ICD-10-CM | POA: Diagnosis not present

## 2020-08-17 DIAGNOSIS — R69 Illness, unspecified: Secondary | ICD-10-CM | POA: Diagnosis not present

## 2020-08-17 DIAGNOSIS — D649 Anemia, unspecified: Secondary | ICD-10-CM | POA: Diagnosis not present

## 2020-08-17 DIAGNOSIS — Z9981 Dependence on supplemental oxygen: Secondary | ICD-10-CM | POA: Diagnosis not present

## 2020-08-17 DIAGNOSIS — M797 Fibromyalgia: Secondary | ICD-10-CM | POA: Diagnosis not present

## 2020-08-17 DIAGNOSIS — Z87891 Personal history of nicotine dependence: Secondary | ICD-10-CM | POA: Diagnosis not present

## 2020-08-17 DIAGNOSIS — G4733 Obstructive sleep apnea (adult) (pediatric): Secondary | ICD-10-CM | POA: Diagnosis not present

## 2020-08-17 DIAGNOSIS — G40909 Epilepsy, unspecified, not intractable, without status epilepticus: Secondary | ICD-10-CM | POA: Diagnosis not present

## 2020-08-17 DIAGNOSIS — M25512 Pain in left shoulder: Secondary | ICD-10-CM | POA: Diagnosis not present

## 2020-08-17 DIAGNOSIS — J45909 Unspecified asthma, uncomplicated: Secondary | ICD-10-CM | POA: Diagnosis not present

## 2020-08-17 DIAGNOSIS — K219 Gastro-esophageal reflux disease without esophagitis: Secondary | ICD-10-CM | POA: Diagnosis not present

## 2020-08-17 DIAGNOSIS — G35 Multiple sclerosis: Secondary | ICD-10-CM | POA: Diagnosis not present

## 2020-08-17 DIAGNOSIS — Z7982 Long term (current) use of aspirin: Secondary | ICD-10-CM | POA: Diagnosis not present

## 2020-08-17 DIAGNOSIS — Z7951 Long term (current) use of inhaled steroids: Secondary | ICD-10-CM | POA: Diagnosis not present

## 2020-08-17 DIAGNOSIS — S82842D Displaced bimalleolar fracture of left lower leg, subsequent encounter for closed fracture with routine healing: Secondary | ICD-10-CM | POA: Diagnosis not present

## 2020-08-18 DIAGNOSIS — S82892B Other fracture of left lower leg, initial encounter for open fracture type I or II: Secondary | ICD-10-CM | POA: Diagnosis not present

## 2020-08-20 DIAGNOSIS — M797 Fibromyalgia: Secondary | ICD-10-CM | POA: Diagnosis not present

## 2020-08-20 DIAGNOSIS — G35 Multiple sclerosis: Secondary | ICD-10-CM | POA: Diagnosis not present

## 2020-08-20 DIAGNOSIS — D649 Anemia, unspecified: Secondary | ICD-10-CM | POA: Diagnosis not present

## 2020-08-20 DIAGNOSIS — M5416 Radiculopathy, lumbar region: Secondary | ICD-10-CM | POA: Diagnosis not present

## 2020-08-20 DIAGNOSIS — I10 Essential (primary) hypertension: Secondary | ICD-10-CM | POA: Diagnosis not present

## 2020-08-20 DIAGNOSIS — J45909 Unspecified asthma, uncomplicated: Secondary | ICD-10-CM | POA: Diagnosis not present

## 2020-08-20 DIAGNOSIS — S82842D Displaced bimalleolar fracture of left lower leg, subsequent encounter for closed fracture with routine healing: Secondary | ICD-10-CM | POA: Diagnosis not present

## 2020-08-20 DIAGNOSIS — R69 Illness, unspecified: Secondary | ICD-10-CM | POA: Diagnosis not present

## 2020-08-20 DIAGNOSIS — M25512 Pain in left shoulder: Secondary | ICD-10-CM | POA: Diagnosis not present

## 2020-08-21 DIAGNOSIS — S82842D Displaced bimalleolar fracture of left lower leg, subsequent encounter for closed fracture with routine healing: Secondary | ICD-10-CM | POA: Diagnosis not present

## 2020-08-26 ENCOUNTER — Encounter: Payer: Self-pay | Admitting: Family Medicine

## 2020-08-26 DIAGNOSIS — Z9889 Other specified postprocedural states: Secondary | ICD-10-CM

## 2020-08-26 DIAGNOSIS — Z8781 Personal history of (healed) traumatic fracture: Secondary | ICD-10-CM | POA: Insufficient documentation

## 2020-08-26 HISTORY — DX: Other specified postprocedural states: Z98.890

## 2020-08-27 DIAGNOSIS — J45909 Unspecified asthma, uncomplicated: Secondary | ICD-10-CM | POA: Diagnosis not present

## 2020-08-27 DIAGNOSIS — R69 Illness, unspecified: Secondary | ICD-10-CM | POA: Diagnosis not present

## 2020-08-27 DIAGNOSIS — D649 Anemia, unspecified: Secondary | ICD-10-CM | POA: Diagnosis not present

## 2020-08-27 DIAGNOSIS — S82842D Displaced bimalleolar fracture of left lower leg, subsequent encounter for closed fracture with routine healing: Secondary | ICD-10-CM | POA: Diagnosis not present

## 2020-08-27 DIAGNOSIS — I10 Essential (primary) hypertension: Secondary | ICD-10-CM | POA: Diagnosis not present

## 2020-08-27 DIAGNOSIS — M25512 Pain in left shoulder: Secondary | ICD-10-CM | POA: Diagnosis not present

## 2020-08-27 DIAGNOSIS — M5416 Radiculopathy, lumbar region: Secondary | ICD-10-CM | POA: Diagnosis not present

## 2020-08-27 DIAGNOSIS — M797 Fibromyalgia: Secondary | ICD-10-CM | POA: Diagnosis not present

## 2020-08-27 DIAGNOSIS — G35 Multiple sclerosis: Secondary | ICD-10-CM | POA: Diagnosis not present

## 2020-08-29 DIAGNOSIS — D649 Anemia, unspecified: Secondary | ICD-10-CM | POA: Diagnosis not present

## 2020-08-29 DIAGNOSIS — G35 Multiple sclerosis: Secondary | ICD-10-CM | POA: Diagnosis not present

## 2020-08-29 DIAGNOSIS — M797 Fibromyalgia: Secondary | ICD-10-CM | POA: Diagnosis not present

## 2020-08-29 DIAGNOSIS — R69 Illness, unspecified: Secondary | ICD-10-CM | POA: Diagnosis not present

## 2020-08-29 DIAGNOSIS — J45909 Unspecified asthma, uncomplicated: Secondary | ICD-10-CM | POA: Diagnosis not present

## 2020-08-29 DIAGNOSIS — M5416 Radiculopathy, lumbar region: Secondary | ICD-10-CM | POA: Diagnosis not present

## 2020-08-29 DIAGNOSIS — M25512 Pain in left shoulder: Secondary | ICD-10-CM | POA: Diagnosis not present

## 2020-08-29 DIAGNOSIS — S82842D Displaced bimalleolar fracture of left lower leg, subsequent encounter for closed fracture with routine healing: Secondary | ICD-10-CM | POA: Diagnosis not present

## 2020-08-29 DIAGNOSIS — I10 Essential (primary) hypertension: Secondary | ICD-10-CM | POA: Diagnosis not present

## 2020-09-03 DIAGNOSIS — S82842D Displaced bimalleolar fracture of left lower leg, subsequent encounter for closed fracture with routine healing: Secondary | ICD-10-CM | POA: Diagnosis not present

## 2020-09-03 DIAGNOSIS — M25512 Pain in left shoulder: Secondary | ICD-10-CM | POA: Diagnosis not present

## 2020-09-03 DIAGNOSIS — R69 Illness, unspecified: Secondary | ICD-10-CM | POA: Diagnosis not present

## 2020-09-03 DIAGNOSIS — J45909 Unspecified asthma, uncomplicated: Secondary | ICD-10-CM | POA: Diagnosis not present

## 2020-09-03 DIAGNOSIS — D649 Anemia, unspecified: Secondary | ICD-10-CM | POA: Diagnosis not present

## 2020-09-03 DIAGNOSIS — M797 Fibromyalgia: Secondary | ICD-10-CM | POA: Diagnosis not present

## 2020-09-03 DIAGNOSIS — G35 Multiple sclerosis: Secondary | ICD-10-CM | POA: Diagnosis not present

## 2020-09-03 DIAGNOSIS — M5416 Radiculopathy, lumbar region: Secondary | ICD-10-CM | POA: Diagnosis not present

## 2020-09-03 DIAGNOSIS — I10 Essential (primary) hypertension: Secondary | ICD-10-CM | POA: Diagnosis not present

## 2020-09-06 DIAGNOSIS — S82842D Displaced bimalleolar fracture of left lower leg, subsequent encounter for closed fracture with routine healing: Secondary | ICD-10-CM | POA: Diagnosis not present

## 2020-09-06 DIAGNOSIS — G35 Multiple sclerosis: Secondary | ICD-10-CM | POA: Diagnosis not present

## 2020-09-06 DIAGNOSIS — M5416 Radiculopathy, lumbar region: Secondary | ICD-10-CM | POA: Diagnosis not present

## 2020-09-06 DIAGNOSIS — R69 Illness, unspecified: Secondary | ICD-10-CM | POA: Diagnosis not present

## 2020-09-06 DIAGNOSIS — M25512 Pain in left shoulder: Secondary | ICD-10-CM | POA: Diagnosis not present

## 2020-09-06 DIAGNOSIS — M797 Fibromyalgia: Secondary | ICD-10-CM | POA: Diagnosis not present

## 2020-09-06 DIAGNOSIS — J45909 Unspecified asthma, uncomplicated: Secondary | ICD-10-CM | POA: Diagnosis not present

## 2020-09-06 DIAGNOSIS — I10 Essential (primary) hypertension: Secondary | ICD-10-CM | POA: Diagnosis not present

## 2020-09-06 DIAGNOSIS — D649 Anemia, unspecified: Secondary | ICD-10-CM | POA: Diagnosis not present

## 2020-09-10 DIAGNOSIS — R69 Illness, unspecified: Secondary | ICD-10-CM | POA: Diagnosis not present

## 2020-09-10 DIAGNOSIS — S82842D Displaced bimalleolar fracture of left lower leg, subsequent encounter for closed fracture with routine healing: Secondary | ICD-10-CM | POA: Diagnosis not present

## 2020-09-10 DIAGNOSIS — G35 Multiple sclerosis: Secondary | ICD-10-CM | POA: Diagnosis not present

## 2020-09-10 DIAGNOSIS — M797 Fibromyalgia: Secondary | ICD-10-CM | POA: Diagnosis not present

## 2020-09-10 DIAGNOSIS — D649 Anemia, unspecified: Secondary | ICD-10-CM | POA: Diagnosis not present

## 2020-09-10 DIAGNOSIS — M25512 Pain in left shoulder: Secondary | ICD-10-CM | POA: Diagnosis not present

## 2020-09-10 DIAGNOSIS — J45909 Unspecified asthma, uncomplicated: Secondary | ICD-10-CM | POA: Diagnosis not present

## 2020-09-10 DIAGNOSIS — I10 Essential (primary) hypertension: Secondary | ICD-10-CM | POA: Diagnosis not present

## 2020-09-10 DIAGNOSIS — M5416 Radiculopathy, lumbar region: Secondary | ICD-10-CM | POA: Diagnosis not present

## 2020-09-11 ENCOUNTER — Other Ambulatory Visit: Payer: Self-pay

## 2020-09-11 ENCOUNTER — Ambulatory Visit: Payer: Medicare HMO

## 2020-09-11 DIAGNOSIS — Z0000 Encounter for general adult medical examination without abnormal findings: Secondary | ICD-10-CM

## 2020-09-11 DIAGNOSIS — S82842D Displaced bimalleolar fracture of left lower leg, subsequent encounter for closed fracture with routine healing: Secondary | ICD-10-CM | POA: Diagnosis not present

## 2020-09-12 ENCOUNTER — Other Ambulatory Visit: Payer: Self-pay | Admitting: Orthopedic Surgery

## 2020-09-12 DIAGNOSIS — S73192A Other sprain of left hip, initial encounter: Secondary | ICD-10-CM

## 2020-09-13 DIAGNOSIS — G35 Multiple sclerosis: Secondary | ICD-10-CM | POA: Diagnosis not present

## 2020-09-13 DIAGNOSIS — J45909 Unspecified asthma, uncomplicated: Secondary | ICD-10-CM | POA: Diagnosis not present

## 2020-09-13 DIAGNOSIS — R69 Illness, unspecified: Secondary | ICD-10-CM | POA: Diagnosis not present

## 2020-09-13 DIAGNOSIS — M5416 Radiculopathy, lumbar region: Secondary | ICD-10-CM | POA: Diagnosis not present

## 2020-09-13 DIAGNOSIS — M25512 Pain in left shoulder: Secondary | ICD-10-CM | POA: Diagnosis not present

## 2020-09-13 DIAGNOSIS — M797 Fibromyalgia: Secondary | ICD-10-CM | POA: Diagnosis not present

## 2020-09-13 DIAGNOSIS — S82842D Displaced bimalleolar fracture of left lower leg, subsequent encounter for closed fracture with routine healing: Secondary | ICD-10-CM | POA: Diagnosis not present

## 2020-09-13 DIAGNOSIS — D649 Anemia, unspecified: Secondary | ICD-10-CM | POA: Diagnosis not present

## 2020-09-13 DIAGNOSIS — I10 Essential (primary) hypertension: Secondary | ICD-10-CM | POA: Diagnosis not present

## 2020-09-15 DIAGNOSIS — S82892B Other fracture of left lower leg, initial encounter for open fracture type I or II: Secondary | ICD-10-CM | POA: Diagnosis not present

## 2020-09-18 DIAGNOSIS — M5416 Radiculopathy, lumbar region: Secondary | ICD-10-CM | POA: Diagnosis not present

## 2020-09-18 DIAGNOSIS — I10 Essential (primary) hypertension: Secondary | ICD-10-CM | POA: Diagnosis not present

## 2020-09-18 DIAGNOSIS — D649 Anemia, unspecified: Secondary | ICD-10-CM | POA: Diagnosis not present

## 2020-09-18 DIAGNOSIS — M25512 Pain in left shoulder: Secondary | ICD-10-CM | POA: Diagnosis not present

## 2020-09-18 DIAGNOSIS — S82842D Displaced bimalleolar fracture of left lower leg, subsequent encounter for closed fracture with routine healing: Secondary | ICD-10-CM | POA: Diagnosis not present

## 2020-09-18 DIAGNOSIS — G35 Multiple sclerosis: Secondary | ICD-10-CM | POA: Diagnosis not present

## 2020-09-18 DIAGNOSIS — M797 Fibromyalgia: Secondary | ICD-10-CM | POA: Diagnosis not present

## 2020-09-18 DIAGNOSIS — J45909 Unspecified asthma, uncomplicated: Secondary | ICD-10-CM | POA: Diagnosis not present

## 2020-09-18 DIAGNOSIS — R69 Illness, unspecified: Secondary | ICD-10-CM | POA: Diagnosis not present

## 2020-09-20 ENCOUNTER — Encounter: Payer: Self-pay | Admitting: Psychology

## 2020-09-20 DIAGNOSIS — M797 Fibromyalgia: Secondary | ICD-10-CM | POA: Diagnosis not present

## 2020-09-20 DIAGNOSIS — S82842D Displaced bimalleolar fracture of left lower leg, subsequent encounter for closed fracture with routine healing: Secondary | ICD-10-CM | POA: Diagnosis not present

## 2020-09-20 DIAGNOSIS — M25512 Pain in left shoulder: Secondary | ICD-10-CM | POA: Diagnosis not present

## 2020-09-20 DIAGNOSIS — G35 Multiple sclerosis: Secondary | ICD-10-CM | POA: Diagnosis not present

## 2020-09-20 DIAGNOSIS — J45909 Unspecified asthma, uncomplicated: Secondary | ICD-10-CM | POA: Diagnosis not present

## 2020-09-20 DIAGNOSIS — I10 Essential (primary) hypertension: Secondary | ICD-10-CM | POA: Diagnosis not present

## 2020-09-20 DIAGNOSIS — R69 Illness, unspecified: Secondary | ICD-10-CM | POA: Diagnosis not present

## 2020-09-20 DIAGNOSIS — D649 Anemia, unspecified: Secondary | ICD-10-CM | POA: Diagnosis not present

## 2020-09-20 DIAGNOSIS — M5416 Radiculopathy, lumbar region: Secondary | ICD-10-CM | POA: Diagnosis not present

## 2020-09-23 DIAGNOSIS — M5416 Radiculopathy, lumbar region: Secondary | ICD-10-CM | POA: Diagnosis not present

## 2020-09-23 DIAGNOSIS — I10 Essential (primary) hypertension: Secondary | ICD-10-CM | POA: Diagnosis not present

## 2020-09-23 DIAGNOSIS — J45909 Unspecified asthma, uncomplicated: Secondary | ICD-10-CM | POA: Diagnosis not present

## 2020-09-23 DIAGNOSIS — S82842D Displaced bimalleolar fracture of left lower leg, subsequent encounter for closed fracture with routine healing: Secondary | ICD-10-CM | POA: Diagnosis not present

## 2020-09-23 DIAGNOSIS — M25512 Pain in left shoulder: Secondary | ICD-10-CM | POA: Diagnosis not present

## 2020-09-23 DIAGNOSIS — R69 Illness, unspecified: Secondary | ICD-10-CM | POA: Diagnosis not present

## 2020-09-23 DIAGNOSIS — D649 Anemia, unspecified: Secondary | ICD-10-CM | POA: Diagnosis not present

## 2020-09-23 DIAGNOSIS — G35 Multiple sclerosis: Secondary | ICD-10-CM | POA: Diagnosis not present

## 2020-09-23 DIAGNOSIS — M797 Fibromyalgia: Secondary | ICD-10-CM | POA: Diagnosis not present

## 2020-09-25 DIAGNOSIS — M797 Fibromyalgia: Secondary | ICD-10-CM | POA: Diagnosis not present

## 2020-09-25 DIAGNOSIS — R69 Illness, unspecified: Secondary | ICD-10-CM | POA: Diagnosis not present

## 2020-09-25 DIAGNOSIS — S82842D Displaced bimalleolar fracture of left lower leg, subsequent encounter for closed fracture with routine healing: Secondary | ICD-10-CM | POA: Diagnosis not present

## 2020-09-25 DIAGNOSIS — J45909 Unspecified asthma, uncomplicated: Secondary | ICD-10-CM | POA: Diagnosis not present

## 2020-09-25 DIAGNOSIS — M25512 Pain in left shoulder: Secondary | ICD-10-CM | POA: Diagnosis not present

## 2020-09-25 DIAGNOSIS — D649 Anemia, unspecified: Secondary | ICD-10-CM | POA: Diagnosis not present

## 2020-09-25 DIAGNOSIS — G35 Multiple sclerosis: Secondary | ICD-10-CM | POA: Diagnosis not present

## 2020-09-25 DIAGNOSIS — M5416 Radiculopathy, lumbar region: Secondary | ICD-10-CM | POA: Diagnosis not present

## 2020-09-25 DIAGNOSIS — I10 Essential (primary) hypertension: Secondary | ICD-10-CM | POA: Diagnosis not present

## 2020-09-29 ENCOUNTER — Ambulatory Visit
Admission: RE | Admit: 2020-09-29 | Discharge: 2020-09-29 | Disposition: A | Discharge status: Home / Self Care | Payer: Medicare HMO | Source: Ambulatory Visit | Attending: Orthopedic Surgery | Admitting: Orthopedic Surgery

## 2020-09-29 ENCOUNTER — Other Ambulatory Visit: Payer: Self-pay

## 2020-09-29 DIAGNOSIS — M75112 Incomplete rotator cuff tear or rupture of left shoulder, not specified as traumatic: Secondary | ICD-10-CM | POA: Diagnosis not present

## 2020-09-29 DIAGNOSIS — M7552 Bursitis of left shoulder: Secondary | ICD-10-CM | POA: Diagnosis not present

## 2020-09-29 DIAGNOSIS — S73192A Other sprain of left hip, initial encounter: Secondary | ICD-10-CM

## 2020-09-30 DIAGNOSIS — H2512 Age-related nuclear cataract, left eye: Secondary | ICD-10-CM | POA: Diagnosis not present

## 2020-09-30 DIAGNOSIS — Z961 Presence of intraocular lens: Secondary | ICD-10-CM | POA: Diagnosis not present

## 2020-09-30 DIAGNOSIS — H25012 Cortical age-related cataract, left eye: Secondary | ICD-10-CM | POA: Diagnosis not present

## 2020-10-02 DIAGNOSIS — S82842D Displaced bimalleolar fracture of left lower leg, subsequent encounter for closed fracture with routine healing: Secondary | ICD-10-CM | POA: Diagnosis not present

## 2020-10-02 DIAGNOSIS — M25512 Pain in left shoulder: Secondary | ICD-10-CM | POA: Diagnosis not present

## 2020-10-03 DIAGNOSIS — S82842D Displaced bimalleolar fracture of left lower leg, subsequent encounter for closed fracture with routine healing: Secondary | ICD-10-CM | POA: Diagnosis not present

## 2020-10-03 DIAGNOSIS — M5416 Radiculopathy, lumbar region: Secondary | ICD-10-CM | POA: Diagnosis not present

## 2020-10-03 DIAGNOSIS — M25512 Pain in left shoulder: Secondary | ICD-10-CM | POA: Diagnosis not present

## 2020-10-03 DIAGNOSIS — M797 Fibromyalgia: Secondary | ICD-10-CM | POA: Diagnosis not present

## 2020-10-03 DIAGNOSIS — I10 Essential (primary) hypertension: Secondary | ICD-10-CM | POA: Diagnosis not present

## 2020-10-03 DIAGNOSIS — R69 Illness, unspecified: Secondary | ICD-10-CM | POA: Diagnosis not present

## 2020-10-03 DIAGNOSIS — D649 Anemia, unspecified: Secondary | ICD-10-CM | POA: Diagnosis not present

## 2020-10-03 DIAGNOSIS — G35 Multiple sclerosis: Secondary | ICD-10-CM | POA: Diagnosis not present

## 2020-10-03 DIAGNOSIS — J45909 Unspecified asthma, uncomplicated: Secondary | ICD-10-CM | POA: Diagnosis not present

## 2020-10-04 DIAGNOSIS — D649 Anemia, unspecified: Secondary | ICD-10-CM | POA: Diagnosis not present

## 2020-10-04 DIAGNOSIS — M797 Fibromyalgia: Secondary | ICD-10-CM | POA: Diagnosis not present

## 2020-10-04 DIAGNOSIS — J45909 Unspecified asthma, uncomplicated: Secondary | ICD-10-CM | POA: Diagnosis not present

## 2020-10-04 DIAGNOSIS — G35 Multiple sclerosis: Secondary | ICD-10-CM | POA: Diagnosis not present

## 2020-10-04 DIAGNOSIS — S82842D Displaced bimalleolar fracture of left lower leg, subsequent encounter for closed fracture with routine healing: Secondary | ICD-10-CM | POA: Diagnosis not present

## 2020-10-04 DIAGNOSIS — M5416 Radiculopathy, lumbar region: Secondary | ICD-10-CM | POA: Diagnosis not present

## 2020-10-04 DIAGNOSIS — M25512 Pain in left shoulder: Secondary | ICD-10-CM | POA: Diagnosis not present

## 2020-10-04 DIAGNOSIS — R69 Illness, unspecified: Secondary | ICD-10-CM | POA: Diagnosis not present

## 2020-10-04 DIAGNOSIS — I10 Essential (primary) hypertension: Secondary | ICD-10-CM | POA: Diagnosis not present

## 2020-10-08 DIAGNOSIS — R69 Illness, unspecified: Secondary | ICD-10-CM | POA: Diagnosis not present

## 2020-10-08 DIAGNOSIS — S82842D Displaced bimalleolar fracture of left lower leg, subsequent encounter for closed fracture with routine healing: Secondary | ICD-10-CM | POA: Diagnosis not present

## 2020-10-08 DIAGNOSIS — D649 Anemia, unspecified: Secondary | ICD-10-CM | POA: Diagnosis not present

## 2020-10-08 DIAGNOSIS — J45909 Unspecified asthma, uncomplicated: Secondary | ICD-10-CM | POA: Diagnosis not present

## 2020-10-08 DIAGNOSIS — I10 Essential (primary) hypertension: Secondary | ICD-10-CM | POA: Diagnosis not present

## 2020-10-08 DIAGNOSIS — M797 Fibromyalgia: Secondary | ICD-10-CM | POA: Diagnosis not present

## 2020-10-08 DIAGNOSIS — M25512 Pain in left shoulder: Secondary | ICD-10-CM | POA: Diagnosis not present

## 2020-10-08 DIAGNOSIS — M5416 Radiculopathy, lumbar region: Secondary | ICD-10-CM | POA: Diagnosis not present

## 2020-10-08 DIAGNOSIS — G35 Multiple sclerosis: Secondary | ICD-10-CM | POA: Diagnosis not present

## 2020-10-09 DIAGNOSIS — H2512 Age-related nuclear cataract, left eye: Secondary | ICD-10-CM | POA: Diagnosis not present

## 2020-10-09 DIAGNOSIS — H25012 Cortical age-related cataract, left eye: Secondary | ICD-10-CM | POA: Diagnosis not present

## 2020-10-09 DIAGNOSIS — H25812 Combined forms of age-related cataract, left eye: Secondary | ICD-10-CM | POA: Diagnosis not present

## 2020-10-11 ENCOUNTER — Other Ambulatory Visit: Payer: Medicare HMO

## 2020-10-11 DIAGNOSIS — D649 Anemia, unspecified: Secondary | ICD-10-CM | POA: Diagnosis not present

## 2020-10-11 DIAGNOSIS — R69 Illness, unspecified: Secondary | ICD-10-CM | POA: Diagnosis not present

## 2020-10-11 DIAGNOSIS — G35 Multiple sclerosis: Secondary | ICD-10-CM | POA: Diagnosis not present

## 2020-10-11 DIAGNOSIS — M797 Fibromyalgia: Secondary | ICD-10-CM | POA: Diagnosis not present

## 2020-10-11 DIAGNOSIS — M5416 Radiculopathy, lumbar region: Secondary | ICD-10-CM | POA: Diagnosis not present

## 2020-10-11 DIAGNOSIS — S82842D Displaced bimalleolar fracture of left lower leg, subsequent encounter for closed fracture with routine healing: Secondary | ICD-10-CM | POA: Diagnosis not present

## 2020-10-11 DIAGNOSIS — J45909 Unspecified asthma, uncomplicated: Secondary | ICD-10-CM | POA: Diagnosis not present

## 2020-10-11 DIAGNOSIS — M25512 Pain in left shoulder: Secondary | ICD-10-CM | POA: Diagnosis not present

## 2020-10-11 DIAGNOSIS — I10 Essential (primary) hypertension: Secondary | ICD-10-CM | POA: Diagnosis not present

## 2020-10-15 DIAGNOSIS — R69 Illness, unspecified: Secondary | ICD-10-CM | POA: Diagnosis not present

## 2020-10-15 DIAGNOSIS — I10 Essential (primary) hypertension: Secondary | ICD-10-CM | POA: Diagnosis not present

## 2020-10-15 DIAGNOSIS — D649 Anemia, unspecified: Secondary | ICD-10-CM | POA: Diagnosis not present

## 2020-10-15 DIAGNOSIS — J45909 Unspecified asthma, uncomplicated: Secondary | ICD-10-CM | POA: Diagnosis not present

## 2020-10-15 DIAGNOSIS — G35 Multiple sclerosis: Secondary | ICD-10-CM | POA: Diagnosis not present

## 2020-10-15 DIAGNOSIS — M5416 Radiculopathy, lumbar region: Secondary | ICD-10-CM | POA: Diagnosis not present

## 2020-10-15 DIAGNOSIS — M25512 Pain in left shoulder: Secondary | ICD-10-CM | POA: Diagnosis not present

## 2020-10-15 DIAGNOSIS — S82842D Displaced bimalleolar fracture of left lower leg, subsequent encounter for closed fracture with routine healing: Secondary | ICD-10-CM | POA: Diagnosis not present

## 2020-10-15 DIAGNOSIS — M797 Fibromyalgia: Secondary | ICD-10-CM | POA: Diagnosis not present

## 2020-10-16 DIAGNOSIS — S82892B Other fracture of left lower leg, initial encounter for open fracture type I or II: Secondary | ICD-10-CM | POA: Diagnosis not present

## 2020-10-16 DIAGNOSIS — M25512 Pain in left shoulder: Secondary | ICD-10-CM | POA: Diagnosis not present

## 2020-10-18 ENCOUNTER — Other Ambulatory Visit (HOSPITAL_COMMUNITY)
Admission: RE | Admit: 2020-10-18 | Discharge: 2020-10-18 | Disposition: A | Discharge status: Home / Self Care | Payer: Medicare HMO | Source: Ambulatory Visit | Attending: Internal Medicine | Admitting: Internal Medicine

## 2020-10-18 DIAGNOSIS — Z7951 Long term (current) use of inhaled steroids: Secondary | ICD-10-CM | POA: Diagnosis not present

## 2020-10-18 DIAGNOSIS — G43119 Migraine with aura, intractable, without status migrainosus: Secondary | ICD-10-CM | POA: Diagnosis not present

## 2020-10-18 DIAGNOSIS — M797 Fibromyalgia: Secondary | ICD-10-CM | POA: Diagnosis not present

## 2020-10-18 DIAGNOSIS — I10 Essential (primary) hypertension: Secondary | ICD-10-CM | POA: Diagnosis not present

## 2020-10-18 DIAGNOSIS — K219 Gastro-esophageal reflux disease without esophagitis: Secondary | ICD-10-CM | POA: Diagnosis not present

## 2020-10-18 DIAGNOSIS — R69 Illness, unspecified: Secondary | ICD-10-CM | POA: Diagnosis not present

## 2020-10-18 DIAGNOSIS — J45909 Unspecified asthma, uncomplicated: Secondary | ICD-10-CM | POA: Diagnosis not present

## 2020-10-18 DIAGNOSIS — S82842D Displaced bimalleolar fracture of left lower leg, subsequent encounter for closed fracture with routine healing: Secondary | ICD-10-CM | POA: Diagnosis not present

## 2020-10-18 DIAGNOSIS — Z20822 Contact with and (suspected) exposure to covid-19: Secondary | ICD-10-CM | POA: Insufficient documentation

## 2020-10-18 DIAGNOSIS — Z7982 Long term (current) use of aspirin: Secondary | ICD-10-CM | POA: Diagnosis not present

## 2020-10-18 DIAGNOSIS — Z87891 Personal history of nicotine dependence: Secondary | ICD-10-CM | POA: Diagnosis not present

## 2020-10-18 DIAGNOSIS — M5416 Radiculopathy, lumbar region: Secondary | ICD-10-CM | POA: Diagnosis not present

## 2020-10-18 DIAGNOSIS — G40909 Epilepsy, unspecified, not intractable, without status epilepticus: Secondary | ICD-10-CM | POA: Diagnosis not present

## 2020-10-18 DIAGNOSIS — Z9981 Dependence on supplemental oxygen: Secondary | ICD-10-CM | POA: Diagnosis not present

## 2020-10-18 DIAGNOSIS — Z01812 Encounter for preprocedural laboratory examination: Secondary | ICD-10-CM | POA: Diagnosis not present

## 2020-10-18 DIAGNOSIS — G35 Multiple sclerosis: Secondary | ICD-10-CM | POA: Diagnosis not present

## 2020-10-18 DIAGNOSIS — G4733 Obstructive sleep apnea (adult) (pediatric): Secondary | ICD-10-CM | POA: Diagnosis not present

## 2020-10-18 DIAGNOSIS — D649 Anemia, unspecified: Secondary | ICD-10-CM | POA: Diagnosis not present

## 2020-10-18 DIAGNOSIS — Z9181 History of falling: Secondary | ICD-10-CM | POA: Diagnosis not present

## 2020-10-18 DIAGNOSIS — M1991 Primary osteoarthritis, unspecified site: Secondary | ICD-10-CM | POA: Diagnosis not present

## 2020-10-18 DIAGNOSIS — M25512 Pain in left shoulder: Secondary | ICD-10-CM | POA: Diagnosis not present

## 2020-10-18 LAB — SARS CORONAVIRUS 2 (TAT 6-24 HRS): SARS Coronavirus 2: NEGATIVE

## 2020-10-20 NOTE — Progress Notes (Signed)
Subjective:    Patient ID: [name], female    DOB: 04/07/1961, 60 y.o.   MRN: [id number] HPI F former smoker followed for asthma and allergic rhinitis, complicated by GERD, depression, VCD, seizure disorder PFT 07/05/08- FVC 3.51/ 88%, FEV1 2.38/ 78%, R 0.68, 25-75% 1.45/ 44%, TLC 83%, DLCO 68% Allergy Profile 01/18/2013-total IgE 242.4 with broad elevations for common inhalant allergens. -----------------------------------------------------------------------------------   12/22/2018- 57 yoF  former smoker followed for asthma and allergic rhinitis, complicated by GERD, depression, VCD, seizure disorder O2 2L sleep/ [employer] Advair 500, ProAir hfa, Epipen, ------last seen in 2015, pt states breathing has not been stable, having lots of wheezing  She tells me she had done very well fora long time, out of her inhaled meds. Then she cleaned dusty closets using Lysol spray and had flare for which she called requesting meds till this appointment. Now feels almost back to baseline. Asthma not disturbing sleep.  Has slept w O2 2L for years. Aware of seasonal allergy triggers- pollens.Denies heart problems, edema, palpitation, fever.  Activity limited by herniated disk- rolling walker. CXR 01/09/18- Lungs are clear. Heart size and pulmonary vascularity are normal. No adenopathy. No bone lesions. IMPRESSION: No edema or consolidation.  10/21/20-59 yoF  former smoker followed for asthma and allergic rhinitis, complicated by GERD, Depression, VCD, seizure disorder, Obesity,  O2 2L sleep/ [employer] -Advair 500, ProAir hfa, Epipen, Fractured L ankle- January.  Covid vax- 2 Phizer Flu vax- had ACT score 23  -----Patient feels that breathing is doing very good. No concerns Feeling very well now with no cough, wheeze or unusual dyspnea. No recent acute illness. She is pending orthopedic surgery and asks clearance.  Continues to sleep with O2, but not needed in daytime. Started remotely for nocturnal  hypoxemia attributed to obesity hypoventilation. Routinely uses Advair 500. Denies thrush.   PFT 10/21/20- Moderate obstructive airways disease with slight respnse to bronchodilator. Normal Diffusion. FEV!/FVC 0.75.   CXR 04/30/20- IMPRESSION: Unremarkable radiographs of the chest.  No acute findings.   ROS-see HPI  + = positive Constitutional:   No-   weight loss, night sweats, fevers, chills, fatigue, lassitude. HEENT:   No-  headaches, difficulty swallowing, tooth/dental problems, sore throat,       +sneezing, itching, ear ache, nasal congestion, post nasal drip,  CV:  No-   chest pain, orthopnea, PND, swelling in lower extremities, anasarca, dizziness, palpitations Resp: No-   shortness of breath with exertion or at rest.              No-   productive cough,  No non-productive cough,  No- coughing up of blood.              No-   change in color of mucus.  No- wheezing now.   Skin:  GI:  No-   heartburn, indigestion, abdominal pain, nausea, vomiting,  GU:  MS:  No-   joint pain or swelling.   Neuro-     nothing unusual Psych:  No- change in mood or affect. No depression or anxiety.  No memory loss.  Objective:  OBJ- Physical Exam General- Alert, Oriented, Affect-appropriate, Distress- none acute. + obese Skin- rash-none, lesions- none, excoriation- none Lymphadenopathy- none Head- atraumatic            Eyes- Gross vision intact, PERRLA, conjunctivae and secretions clear            Ears- Hearing, canals-normal            Nose-  Clear, no-Septal dev, mucus, polyps, erosion, perforation             Throat- Mallampati II-III , mucosa clear , drainage- none, tonsils- atrophic Neck- flexible , trachea midline, no stridor , thyroid nl, carotid no bruit Chest - symmetrical excursion , unlabored           Heart/CV- RRR , no murmur , no gallop  , no rub, nl s1 s2                           - JVD- none , edema- none, stasis changes- none, varices- none           Lung- clear to P&A,  wheeze- none, cough- none , dullness-none, rub- none           Chest wall-  Abd-  Br/ Gen/ Rectal- Not done, not indicated Extrem- +boot on left lower leg Neuro- grossly intact to observation  Assessment & Plan:

## 2020-10-21 ENCOUNTER — Ambulatory Visit (INDEPENDENT_AMBULATORY_CARE_PROVIDER_SITE_OTHER): Payer: Medicare HMO | Admitting: Internal Medicine

## 2020-10-21 ENCOUNTER — Other Ambulatory Visit: Payer: Self-pay

## 2020-10-21 ENCOUNTER — Encounter: Payer: Self-pay | Admitting: Internal Medicine

## 2020-10-21 ENCOUNTER — Ambulatory Visit: Payer: Medicare HMO | Admitting: Internal Medicine

## 2020-10-21 DIAGNOSIS — J9611 Chronic respiratory failure with hypoxia: Secondary | ICD-10-CM

## 2020-10-21 DIAGNOSIS — J4540 Moderate persistent asthma, uncomplicated: Secondary | ICD-10-CM

## 2020-10-21 LAB — PULMONARY FUNCTION TEST
DL/VA % pred: 135 %
DL/VA: 5.52 ml/min/mmHg/L
DLCO unc % pred: 98 %
DLCO unc: 23.75 ml/min/mmHg
FEF 25-75 Post: 1.56 L/sec
FEF 25-75 Pre: 1.06 L/sec
FEF2575-%Change-Post: 46 %
FEF2575-%Pred-Post: 61 %
FEF2575-%Pred-Pre: 42 %
FEV1-%Change-Post: 12 %
FEV1-%Pred-Post: 72 %
FEV1-%Pred-Pre: 64 %
FEV1-Post: 1.89 L
FEV1-Pre: 1.69 L
FEV1FVC-%Change-Post: 9 %
FEV1FVC-%Pred-Pre: 85 %
FEV6-%Change-Post: 2 %
FEV6-%Pred-Post: 79 %
FEV6-%Pred-Pre: 77 %
FEV6-Post: 2.53 L
FEV6-Pre: 2.48 L
FEV6FVC-%Pred-Post: 102 %
FEV6FVC-%Pred-Pre: 102 %
FVC-%Change-Post: 2 %
FVC-%Pred-Post: 76 %
FVC-%Pred-Pre: 75 %
FVC-Post: 2.53 L
FVC-Pre: 2.48 L
Post FEV1/FVC ratio: 75 %
Post FEV6/FVC ratio: 100 %
Pre FEV1/FVC ratio: 68 %
Pre FEV6/FVC Ratio: 100 %

## 2020-10-21 MED ORDER — TRELEGY ELLIPTA 100-62.5-25 MCG/INH IN AEPB: 1.0000 | INHALATION_SPRAY | Freq: Every day | RESPIRATORY_TRACT | 0 refills | Status: DC

## 2020-10-21 NOTE — Patient Instructions (Addendum)
Order- sample Trelegy 100 inhaler   Inhale 1 puff, then rinse mouth, once daily. Try this instead of Advair. If you can tell it really works better for your breathing, please leet [place] know. Otherwise, when the sample runs out, go back to Advair..  You can still use the Proair rescue inhaler 2 puff, every 6 hours, If needed, as before.  Please call if we can help  Addendum:  Stable with low to moderate risk of respiratory complication during planned surgery.

## 2020-10-21 NOTE — Patient Instructions (Signed)
Pulmonary function test performed today. 

## 2020-10-21 NOTE — Progress Notes (Signed)
Full PFT performed today. Patient had great difficulty performing test.

## 2020-10-21 NOTE — Assessment & Plan Note (Signed)
Stable, uncomplicated. She feels very well. With routine operative and postoperative surveillance I think risk of respiratory complication during planned surgery is low to moderate.  We will let her try sample Trelegy inhaler for comparison with Advair.

## 2020-10-22 ENCOUNTER — Other Ambulatory Visit: Payer: Self-pay | Admitting: Family Medicine

## 2020-10-22 DIAGNOSIS — S82842D Displaced bimalleolar fracture of left lower leg, subsequent encounter for closed fracture with routine healing: Secondary | ICD-10-CM | POA: Diagnosis not present

## 2020-10-22 DIAGNOSIS — M797 Fibromyalgia: Secondary | ICD-10-CM | POA: Diagnosis not present

## 2020-10-22 DIAGNOSIS — D649 Anemia, unspecified: Secondary | ICD-10-CM | POA: Diagnosis not present

## 2020-10-22 DIAGNOSIS — M25512 Pain in left shoulder: Secondary | ICD-10-CM | POA: Diagnosis not present

## 2020-10-22 DIAGNOSIS — G35 Multiple sclerosis: Secondary | ICD-10-CM | POA: Diagnosis not present

## 2020-10-22 DIAGNOSIS — G4733 Obstructive sleep apnea (adult) (pediatric): Secondary | ICD-10-CM | POA: Diagnosis not present

## 2020-10-22 DIAGNOSIS — G40909 Epilepsy, unspecified, not intractable, without status epilepticus: Secondary | ICD-10-CM | POA: Diagnosis not present

## 2020-10-22 DIAGNOSIS — M1991 Primary osteoarthritis, unspecified site: Secondary | ICD-10-CM | POA: Diagnosis not present

## 2020-10-22 DIAGNOSIS — Z7982 Long term (current) use of aspirin: Secondary | ICD-10-CM | POA: Diagnosis not present

## 2020-10-22 DIAGNOSIS — R69 Illness, unspecified: Secondary | ICD-10-CM | POA: Diagnosis not present

## 2020-10-22 DIAGNOSIS — Z9981 Dependence on supplemental oxygen: Secondary | ICD-10-CM | POA: Diagnosis not present

## 2020-10-22 DIAGNOSIS — I10 Essential (primary) hypertension: Secondary | ICD-10-CM

## 2020-10-22 DIAGNOSIS — Z7951 Long term (current) use of inhaled steroids: Secondary | ICD-10-CM | POA: Diagnosis not present

## 2020-10-22 DIAGNOSIS — G43119 Migraine with aura, intractable, without status migrainosus: Secondary | ICD-10-CM | POA: Diagnosis not present

## 2020-10-22 DIAGNOSIS — M5416 Radiculopathy, lumbar region: Secondary | ICD-10-CM | POA: Diagnosis not present

## 2020-10-22 DIAGNOSIS — Z9181 History of falling: Secondary | ICD-10-CM | POA: Diagnosis not present

## 2020-10-22 DIAGNOSIS — K219 Gastro-esophageal reflux disease without esophagitis: Secondary | ICD-10-CM | POA: Diagnosis not present

## 2020-10-22 DIAGNOSIS — Z87891 Personal history of nicotine dependence: Secondary | ICD-10-CM | POA: Diagnosis not present

## 2020-10-22 DIAGNOSIS — J45909 Unspecified asthma, uncomplicated: Secondary | ICD-10-CM | POA: Diagnosis not present

## 2020-10-24 DIAGNOSIS — G4733 Obstructive sleep apnea (adult) (pediatric): Secondary | ICD-10-CM | POA: Diagnosis not present

## 2020-10-24 DIAGNOSIS — Z9181 History of falling: Secondary | ICD-10-CM | POA: Diagnosis not present

## 2020-10-24 DIAGNOSIS — M5416 Radiculopathy, lumbar region: Secondary | ICD-10-CM | POA: Diagnosis not present

## 2020-10-24 DIAGNOSIS — J45909 Unspecified asthma, uncomplicated: Secondary | ICD-10-CM | POA: Diagnosis not present

## 2020-10-24 DIAGNOSIS — M797 Fibromyalgia: Secondary | ICD-10-CM | POA: Diagnosis not present

## 2020-10-24 DIAGNOSIS — G43119 Migraine with aura, intractable, without status migrainosus: Secondary | ICD-10-CM | POA: Diagnosis not present

## 2020-10-24 DIAGNOSIS — R69 Illness, unspecified: Secondary | ICD-10-CM | POA: Diagnosis not present

## 2020-10-24 DIAGNOSIS — G40909 Epilepsy, unspecified, not intractable, without status epilepticus: Secondary | ICD-10-CM | POA: Diagnosis not present

## 2020-10-24 DIAGNOSIS — I10 Essential (primary) hypertension: Secondary | ICD-10-CM | POA: Diagnosis not present

## 2020-10-24 DIAGNOSIS — Z7982 Long term (current) use of aspirin: Secondary | ICD-10-CM | POA: Diagnosis not present

## 2020-10-24 DIAGNOSIS — Z7951 Long term (current) use of inhaled steroids: Secondary | ICD-10-CM | POA: Diagnosis not present

## 2020-10-24 DIAGNOSIS — M25512 Pain in left shoulder: Secondary | ICD-10-CM | POA: Diagnosis not present

## 2020-10-24 DIAGNOSIS — M1991 Primary osteoarthritis, unspecified site: Secondary | ICD-10-CM | POA: Diagnosis not present

## 2020-10-24 DIAGNOSIS — Z9981 Dependence on supplemental oxygen: Secondary | ICD-10-CM | POA: Diagnosis not present

## 2020-10-24 DIAGNOSIS — G35 Multiple sclerosis: Secondary | ICD-10-CM | POA: Diagnosis not present

## 2020-10-24 DIAGNOSIS — S82842D Displaced bimalleolar fracture of left lower leg, subsequent encounter for closed fracture with routine healing: Secondary | ICD-10-CM | POA: Diagnosis not present

## 2020-10-24 DIAGNOSIS — D649 Anemia, unspecified: Secondary | ICD-10-CM | POA: Diagnosis not present

## 2020-10-24 DIAGNOSIS — K219 Gastro-esophageal reflux disease without esophagitis: Secondary | ICD-10-CM | POA: Diagnosis not present

## 2020-10-24 DIAGNOSIS — Z87891 Personal history of nicotine dependence: Secondary | ICD-10-CM | POA: Diagnosis not present

## 2020-10-25 DIAGNOSIS — J309 Allergic rhinitis, unspecified: Secondary | ICD-10-CM | POA: Diagnosis not present

## 2020-10-25 DIAGNOSIS — I10 Essential (primary) hypertension: Secondary | ICD-10-CM | POA: Diagnosis not present

## 2020-10-25 DIAGNOSIS — G8929 Other chronic pain: Secondary | ICD-10-CM | POA: Diagnosis not present

## 2020-10-25 DIAGNOSIS — J449 Chronic obstructive pulmonary disease, unspecified: Secondary | ICD-10-CM | POA: Diagnosis not present

## 2020-10-25 DIAGNOSIS — R69 Illness, unspecified: Secondary | ICD-10-CM | POA: Diagnosis not present

## 2020-10-25 DIAGNOSIS — G4733 Obstructive sleep apnea (adult) (pediatric): Secondary | ICD-10-CM | POA: Diagnosis not present

## 2020-10-25 DIAGNOSIS — R251 Tremor, unspecified: Secondary | ICD-10-CM | POA: Diagnosis not present

## 2020-10-28 DIAGNOSIS — M1991 Primary osteoarthritis, unspecified site: Secondary | ICD-10-CM | POA: Diagnosis not present

## 2020-10-28 DIAGNOSIS — M25512 Pain in left shoulder: Secondary | ICD-10-CM | POA: Diagnosis not present

## 2020-10-28 DIAGNOSIS — M5416 Radiculopathy, lumbar region: Secondary | ICD-10-CM | POA: Diagnosis not present

## 2020-10-28 DIAGNOSIS — R69 Illness, unspecified: Secondary | ICD-10-CM | POA: Diagnosis not present

## 2020-10-28 DIAGNOSIS — Z87891 Personal history of nicotine dependence: Secondary | ICD-10-CM | POA: Diagnosis not present

## 2020-10-28 DIAGNOSIS — G40909 Epilepsy, unspecified, not intractable, without status epilepticus: Secondary | ICD-10-CM | POA: Diagnosis not present

## 2020-10-28 DIAGNOSIS — J45909 Unspecified asthma, uncomplicated: Secondary | ICD-10-CM | POA: Diagnosis not present

## 2020-10-28 DIAGNOSIS — Z7982 Long term (current) use of aspirin: Secondary | ICD-10-CM | POA: Diagnosis not present

## 2020-10-28 DIAGNOSIS — S82842D Displaced bimalleolar fracture of left lower leg, subsequent encounter for closed fracture with routine healing: Secondary | ICD-10-CM | POA: Diagnosis not present

## 2020-10-28 DIAGNOSIS — G35 Multiple sclerosis: Secondary | ICD-10-CM | POA: Diagnosis not present

## 2020-10-28 DIAGNOSIS — Z9981 Dependence on supplemental oxygen: Secondary | ICD-10-CM | POA: Diagnosis not present

## 2020-10-28 DIAGNOSIS — Z7951 Long term (current) use of inhaled steroids: Secondary | ICD-10-CM | POA: Diagnosis not present

## 2020-10-28 DIAGNOSIS — G43119 Migraine with aura, intractable, without status migrainosus: Secondary | ICD-10-CM | POA: Diagnosis not present

## 2020-10-28 DIAGNOSIS — I10 Essential (primary) hypertension: Secondary | ICD-10-CM | POA: Diagnosis not present

## 2020-10-28 DIAGNOSIS — G4733 Obstructive sleep apnea (adult) (pediatric): Secondary | ICD-10-CM | POA: Diagnosis not present

## 2020-10-28 DIAGNOSIS — K219 Gastro-esophageal reflux disease without esophagitis: Secondary | ICD-10-CM | POA: Diagnosis not present

## 2020-10-28 DIAGNOSIS — Z9181 History of falling: Secondary | ICD-10-CM | POA: Diagnosis not present

## 2020-10-28 DIAGNOSIS — M797 Fibromyalgia: Secondary | ICD-10-CM | POA: Diagnosis not present

## 2020-10-28 DIAGNOSIS — D649 Anemia, unspecified: Secondary | ICD-10-CM | POA: Diagnosis not present

## 2020-11-12 DIAGNOSIS — R69 Illness, unspecified: Secondary | ICD-10-CM | POA: Diagnosis not present

## 2020-11-12 DIAGNOSIS — M5416 Radiculopathy, lumbar region: Secondary | ICD-10-CM | POA: Diagnosis not present

## 2020-11-12 DIAGNOSIS — Z87891 Personal history of nicotine dependence: Secondary | ICD-10-CM | POA: Diagnosis not present

## 2020-11-12 DIAGNOSIS — J45909 Unspecified asthma, uncomplicated: Secondary | ICD-10-CM | POA: Diagnosis not present

## 2020-11-12 DIAGNOSIS — M25512 Pain in left shoulder: Secondary | ICD-10-CM | POA: Diagnosis not present

## 2020-11-12 DIAGNOSIS — G35 Multiple sclerosis: Secondary | ICD-10-CM | POA: Diagnosis not present

## 2020-11-12 DIAGNOSIS — S82842D Displaced bimalleolar fracture of left lower leg, subsequent encounter for closed fracture with routine healing: Secondary | ICD-10-CM | POA: Diagnosis not present

## 2020-11-12 DIAGNOSIS — G43119 Migraine with aura, intractable, without status migrainosus: Secondary | ICD-10-CM | POA: Diagnosis not present

## 2020-11-12 DIAGNOSIS — M1991 Primary osteoarthritis, unspecified site: Secondary | ICD-10-CM | POA: Diagnosis not present

## 2020-11-12 DIAGNOSIS — Z9981 Dependence on supplemental oxygen: Secondary | ICD-10-CM | POA: Diagnosis not present

## 2020-11-12 DIAGNOSIS — G4733 Obstructive sleep apnea (adult) (pediatric): Secondary | ICD-10-CM | POA: Diagnosis not present

## 2020-11-12 DIAGNOSIS — Z9181 History of falling: Secondary | ICD-10-CM | POA: Diagnosis not present

## 2020-11-12 DIAGNOSIS — G40909 Epilepsy, unspecified, not intractable, without status epilepticus: Secondary | ICD-10-CM | POA: Diagnosis not present

## 2020-11-12 DIAGNOSIS — Z7951 Long term (current) use of inhaled steroids: Secondary | ICD-10-CM | POA: Diagnosis not present

## 2020-11-12 DIAGNOSIS — K219 Gastro-esophageal reflux disease without esophagitis: Secondary | ICD-10-CM | POA: Diagnosis not present

## 2020-11-12 DIAGNOSIS — I10 Essential (primary) hypertension: Secondary | ICD-10-CM | POA: Diagnosis not present

## 2020-11-12 DIAGNOSIS — Z7982 Long term (current) use of aspirin: Secondary | ICD-10-CM | POA: Diagnosis not present

## 2020-11-12 DIAGNOSIS — D649 Anemia, unspecified: Secondary | ICD-10-CM | POA: Diagnosis not present

## 2020-11-12 DIAGNOSIS — M797 Fibromyalgia: Secondary | ICD-10-CM | POA: Diagnosis not present

## 2020-11-15 DIAGNOSIS — S82892B Other fracture of left lower leg, initial encounter for open fracture type I or II: Secondary | ICD-10-CM | POA: Diagnosis not present

## 2020-11-16 DIAGNOSIS — K219 Gastro-esophageal reflux disease without esophagitis: Secondary | ICD-10-CM | POA: Diagnosis not present

## 2020-11-16 DIAGNOSIS — J45909 Unspecified asthma, uncomplicated: Secondary | ICD-10-CM | POA: Diagnosis not present

## 2020-11-16 DIAGNOSIS — Z9981 Dependence on supplemental oxygen: Secondary | ICD-10-CM | POA: Diagnosis not present

## 2020-11-16 DIAGNOSIS — M5416 Radiculopathy, lumbar region: Secondary | ICD-10-CM | POA: Diagnosis not present

## 2020-11-16 DIAGNOSIS — Z7982 Long term (current) use of aspirin: Secondary | ICD-10-CM | POA: Diagnosis not present

## 2020-11-16 DIAGNOSIS — M1991 Primary osteoarthritis, unspecified site: Secondary | ICD-10-CM | POA: Diagnosis not present

## 2020-11-16 DIAGNOSIS — D649 Anemia, unspecified: Secondary | ICD-10-CM | POA: Diagnosis not present

## 2020-11-16 DIAGNOSIS — S82842D Displaced bimalleolar fracture of left lower leg, subsequent encounter for closed fracture with routine healing: Secondary | ICD-10-CM | POA: Diagnosis not present

## 2020-11-16 DIAGNOSIS — G4733 Obstructive sleep apnea (adult) (pediatric): Secondary | ICD-10-CM | POA: Diagnosis not present

## 2020-11-16 DIAGNOSIS — R69 Illness, unspecified: Secondary | ICD-10-CM | POA: Diagnosis not present

## 2020-11-16 DIAGNOSIS — M25512 Pain in left shoulder: Secondary | ICD-10-CM | POA: Diagnosis not present

## 2020-11-16 DIAGNOSIS — M797 Fibromyalgia: Secondary | ICD-10-CM | POA: Diagnosis not present

## 2020-11-16 DIAGNOSIS — G40909 Epilepsy, unspecified, not intractable, without status epilepticus: Secondary | ICD-10-CM | POA: Diagnosis not present

## 2020-11-16 DIAGNOSIS — Z9181 History of falling: Secondary | ICD-10-CM | POA: Diagnosis not present

## 2020-11-16 DIAGNOSIS — G35 Multiple sclerosis: Secondary | ICD-10-CM | POA: Diagnosis not present

## 2020-11-16 DIAGNOSIS — I10 Essential (primary) hypertension: Secondary | ICD-10-CM | POA: Diagnosis not present

## 2020-11-16 DIAGNOSIS — G43119 Migraine with aura, intractable, without status migrainosus: Secondary | ICD-10-CM | POA: Diagnosis not present

## 2020-11-16 DIAGNOSIS — Z87891 Personal history of nicotine dependence: Secondary | ICD-10-CM | POA: Diagnosis not present

## 2020-11-16 DIAGNOSIS — Z7951 Long term (current) use of inhaled steroids: Secondary | ICD-10-CM | POA: Diagnosis not present

## 2020-11-18 ENCOUNTER — Telehealth: Payer: Self-pay | Admitting: Family Medicine

## 2020-11-18 NOTE — Telephone Encounter (Signed)
Left message for patient to call back and schedule Medicare Annual Wellness Visit (AWV) either virtually or in office.   Last [facility] 09/01/18 please schedule at anytime with [facility] Nurse Health Advisor 1 or 2   This should be a 45 minute visit.

## 2020-11-19 ENCOUNTER — Telehealth: Payer: Medicare HMO

## 2020-12-03 ENCOUNTER — Telehealth: Payer: Medicare HMO | Admitting: Family Medicine

## 2020-12-03 ENCOUNTER — Ambulatory Visit (INDEPENDENT_AMBULATORY_CARE_PROVIDER_SITE_OTHER): Payer: Medicare HMO

## 2020-12-03 ENCOUNTER — Ambulatory Visit
Admission: EM | Admit: 2020-12-03 | Discharge: 2020-12-03 | Disposition: A | Discharge status: Home / Self Care | Payer: Medicare HMO | Attending: Family Medicine | Admitting: Family Medicine

## 2020-12-03 ENCOUNTER — Other Ambulatory Visit: Payer: Self-pay

## 2020-12-03 ENCOUNTER — Encounter: Payer: Self-pay | Admitting: Emergency Medicine

## 2020-12-03 ENCOUNTER — Telehealth (INDEPENDENT_AMBULATORY_CARE_PROVIDER_SITE_OTHER): Payer: Medicare HMO | Admitting: Family Medicine

## 2020-12-03 ENCOUNTER — Encounter: Payer: Self-pay | Admitting: Family Medicine

## 2020-12-03 VITALS — BP 113/80 | Temp 101.0°F | Wt 255.0 lb

## 2020-12-03 DIAGNOSIS — Z7689 Persons encountering health services in other specified circumstances: Secondary | ICD-10-CM | POA: Diagnosis not present

## 2020-12-03 DIAGNOSIS — R0789 Other chest pain: Secondary | ICD-10-CM

## 2020-12-03 DIAGNOSIS — R0602 Shortness of breath: Secondary | ICD-10-CM

## 2020-12-03 DIAGNOSIS — R6889 Other general symptoms and signs: Secondary | ICD-10-CM | POA: Diagnosis not present

## 2020-12-03 DIAGNOSIS — R509 Fever, unspecified: Secondary | ICD-10-CM

## 2020-12-03 DIAGNOSIS — R079 Chest pain, unspecified: Secondary | ICD-10-CM | POA: Diagnosis not present

## 2020-12-03 MED ORDER — DEXAMETHASONE SODIUM PHOSPHATE 10 MG/ML IJ SOLN
10.0000 mg | Freq: Once | INTRAMUSCULAR | Status: AC
  Administered 2020-12-03: 10 mg via INTRAMUSCULAR

## 2020-12-03 MED ORDER — ALBUTEROL SULFATE HFA 108 (90 BASE) MCG/ACT IN AERS
2.0000 | INHALATION_SPRAY | Freq: Once | RESPIRATORY_TRACT | Status: AC
  Administered 2020-12-03: 2 via RESPIRATORY_TRACT

## 2020-12-03 MED ORDER — DOXYCYCLINE HYCLATE 100 MG PO CAPS: 100.0000 mg | ORAL_CAPSULE | Freq: Two times a day (BID) | ORAL | 0 refills | Status: DC

## 2020-12-03 MED ORDER — PREDNISONE 20 MG PO TABS: 40.0000 mg | ORAL_TABLET | Freq: Every day | ORAL | 0 refills | Status: DC

## 2020-12-03 MED ORDER — ALBUTEROL SULFATE (2.5 MG/3ML) 0.083% IN NEBU: 3.0000 mL | INHALATION_SOLUTION | RESPIRATORY_TRACT | 12 refills | Status: AC | PRN

## 2020-12-03 NOTE — Discharge Instructions (Addendum)
Given that your chest pain has been present for several hours throughout the day I do recommend going to the emergency department for further work-up and evaluation to ensure that you are not experiencing any type of cardiovascular event.  Your EKG here today is nonspecific and if your chest pain continues you will need evaluation in the emergency department so that she can obtain cardiac enzymes rule out an active coronary event. Continue Tylenol and ibuprofen for management of fever Continue home inhalers. Am starting you on prednisone 40 mg once daily for 5 days and doxycycline 100 mg twice daily for 10 days. Your COVID 19 results should result within 3-4 days. Negative results are immediately resulted to Mychart. Positive results will receive a follow-up call from our clinic. If symptoms are present, I recommend home quarantine until results are known.  Alternate Tylenol and ibuprofen as needed for body aches and fever.  Symptom management per recommendations discussed today.  If any breathing difficulty or chest pain develops go immediately to the closest emergency department for evaluation.

## 2020-12-03 NOTE — Progress Notes (Signed)
Virtual Visit via Video Note  I connected with [name]  on 12/03/20 at  5:40 PM EDT by a video enabled telemedicine application and verified that I am speaking with the correct person using two identifiers.  Location patient: home, [place] Location provider:work or home office Persons participating in the virtual visit: patient, provider  I discussed the limitations of evaluation and management by telemedicine and the availability of in person appointments. The patient expressed understanding and agreed to proceed.   HPI:  Acute telemedicine visit for flu like symptoms: -Onset: 5 days ago -Symptoms include: sore throat, nasal congestion, fever 102 today, cough, some body aches, CP (reports she gets this with her asthma sometimes), SOB, she is requiring her albuterol every 2 hours, not eating -Denies: nausea, inability to drink -Pertinent past medical history:see below -Pertinent medication allergies: Allergies  Allergen Reactions  . Bee Venom Anaphylaxis  . Depakote [Divalproex Sodium] Shortness Of Breath and Rash  . Haloperidol Lactate Swelling  . Hornet Venom Anaphylaxis  . Latex Anaphylaxis    mild rash/itching, wheezing/sob  . Peanut-Containing Drug Products Anaphylaxis  . Penicillins Anaphylaxis and Swelling  . Quetiapine Anaphylaxis and Other (See Comments)  . Shellfish-Derived Products Anaphylaxis  . Valproic Acid Swelling  . Amitriptyline Hcl Other (See Comments)    HALLUCINATIONS  . Butorphanol Tartrate Other (See Comments)    Hallucinations  . Gabapentin Other (See Comments)    Pt reports severe back and side pain  . Iodine     Flushing and fainting  . Pregabalin Rash  . Terbutaline Rash  -COVID-19 vaccine status: vaccinated - two doses in chart records  ROS: See pertinent positives and negatives per HPI.  Past Medical History:  Diagnosis Date  . Arthritis    oa  . Arthropathy, unspecified, site unspecified   . Asthma   . Backache, unspecified   . Bipolar I  disorder 06/13/2008   Qualifier: Diagnosis of  By: Burt Knack CMA, Cecille Rubin    . Closed left ankle fracture   . Fibromyalgia   . Generalized anxiety disorder 04/05/2012  . GERD (gastroesophageal reflux disease) 07/05/2008   Qualifier: Diagnosis of  By: Joya Gaskins MD, Burnett Harry   . Hypertension   . Intractable migraine with aura 11/03/2013  . Lumbar radiculopathy 08/30/2012  . Major depressive disorder 09/08/2019  . Major depressive disorder, recurrent, severe with psychotic features 04/05/2012  . Mild neurocognitive disorder, unclear etiology 09/08/2019  . Multiple sclerosis 11/03/2013   remission at present(past hx. bedridden) -ambulates with cane-left side weaker  . Obstructive sleep apnea 06/13/2008   oxygen 2liters prn at hs  . On home oxygen therapy    nightly at 2l/m prn  . PONV (postoperative nausea and vomiting)   . Seizure disorder    last seizure 2014    Past Surgical History:  Procedure Laterality Date  . APPENDECTOMY  1988   laparoscopic  . BREAST LUMPECTOMY WITH NEEDLE LOCALIZATION Left 06/06/2015   Procedure: LEFT BREAST LUMPECTOMY WITH NEEDLE LOCALIZATION TIMES TWO;  Surgeon: Erroll Luna, MD;  Location: [facility];  Service: General;  Laterality: Left;  . CESAREAN SECTION  1988   twins  . CHOLECYSTECTOMY  yrs ago  . COLONOSCOPY N/A 06/12/2013   Procedure: COLONOSCOPY;  Surgeon: Irene Shipper, MD;  Location: [facility];  Service: Endoscopy;  Laterality: N/A;  . HERNIA REPAIR  yrs ago   umbilical  . ORIF ANKLE FRACTURE Left 07/16/2020   Procedure: OPEN REDUCTION INTERNAL FIXATION (ORIF) ANKLE FRACTURE;  Surgeon: Edmonia Lynch  D, MD;  Location: [facility];  Service: Orthopedics;  Laterality: Left;  . TUBAL LIGATION  1988  . VAGINAL HYSTERECTOMY  2005   complete     Current Outpatient Medications:  .  acetaminophen (TYLENOL) 500 MG tablet, Take 1,000 mg by mouth every 6 (six) hours as needed for moderate pain., Disp: , Rfl:  .  albuterol (PROAIR  HFA) 108 (90 Base) MCG/ACT inhaler, INHALE 2 PUFFS BY MOUTH EVERY 4 HOURS AS NEEDED FOR WHEEZING OR SHORTNESS OF BREATH (Patient taking differently: Inhale 2 puffs into the lungs every 4 (four) hours as needed for wheezing or shortness of breath.), Disp: 18 g, Rfl: 12 .  albuterol (PROVENTIL) (2.5 MG/3ML) 0.083% nebulizer solution, Take 3 mLs by nebulization every 4 (four) hours as needed for wheezing or shortness of breath., Disp: 75 mL, Rfl: 12 .  aspirin EC 81 MG EC tablet, Take 1 tablet (81 mg total) by mouth 2 (two) times daily. Swallow whole., Disp: 30 tablet, Rfl: 11 .  benztropine (COGENTIN) 0.5 MG tablet, Take 0.5 mg by mouth daily. , Disp: , Rfl:  .  EPINEPHrine 0.3 mg/0.3 mL IJ SOAJ injection, Inject 0.3 mg into the muscle daily as needed for anaphylaxis., Disp: , Rfl:  .  famotidine (PEPCID) 20 MG tablet, Take 1 tablet (20 mg total) by mouth 2 (two) times daily. For stomach acid. (Patient taking differently: Take 20 mg by mouth daily.), Disp: 60 tablet, Rfl: 0 .  FLUoxetine (PROZAC) 40 MG capsule, Take 80 mg by mouth daily., Disp: , Rfl:  .  Fluticasone-Salmeterol (ADVAIR) 500-50 MCG/DOSE AEPB, Inhale 1 puff into the lungs 2 (two) times daily., Disp: , Rfl:  .  Fluticasone-Umeclidin-Vilant (TRELEGY ELLIPTA) 100-62.5-25 MCG/INH AEPB, Inhale 1 puff into the lungs daily., Disp: 28 each, Rfl: 0 .  HYDROcodone-acetaminophen (NORCO/VICODIN) 5-325 MG tablet, Take 1 tablet by mouth every 4 (four) hours as needed for moderate pain., Disp: 20 tablet, Rfl: 0 .  HYDROcodone-acetaminophen (NORCO/VICODIN) 5-325 MG tablet, Take 1-2 tablets by mouth every 4 (four) hours as needed for moderate pain (pain score 4-6)., Disp: 30 tablet, Rfl: 0 .  hydrOXYzine (ATARAX/VISTARIL) 25 MG tablet, Take 25 mg by mouth 4 (four) times daily as needed for anxiety or itching., Disp: , Rfl: 2 .  lamoTRIgine (LAMICTAL) 200 MG tablet, Take 200 mg by mouth at bedtime., Disp: , Rfl:  .  metoprolol succinate (TOPROL-XL) 50 MG 24  hr tablet, TAKE 1 TABLET(50 MG) BY MOUTH DAILY WITH OR IMMEDIATELY FOLLOWING A MEAL, Disp: 90 tablet, Rfl: 1 .  mirtazapine (REMERON) 15 MG tablet, Take 15 mg by mouth at bedtime., Disp: , Rfl:  .  Multiple Vitamin (MULTIVITAMIN WITH MINERALS) TABS, Take 1 tablet by mouth daily. For nutritional supplementation. (Patient taking differently: Take 1 tablet by mouth daily.), Disp: 30 tablet, Rfl: 0 .  ondansetron (ZOFRAN) 4 MG tablet, Take 1 tablet (4 mg total) by mouth every 6 (six) hours as needed for nausea., Disp: 20 tablet, Rfl: 0 .  polyethylene glycol powder (GLYCOLAX/MIRALAX) 17 GM/SCOOP powder, Take 17 g by mouth daily as needed., Disp: 1350 g, Rfl: 3 .  tiZANidine (ZANAFLEX) 4 MG tablet, Take 4 mg by mouth 3 (three) times daily as needed for muscle spasms., Disp: , Rfl:  .  VITAMIN D PO, Take 1 capsule by mouth daily. , Disp: , Rfl:  .  ziprasidone (GEODON) 20 MG capsule, Take 20 mg by mouth 2 (two) times daily with a meal., Disp: , Rfl:  EXAM:  VITALS per patient if applicable:  GENERAL: alert, oriented, appears well and in no acute distress  HEENT: atraumatic, conjunttiva clear, no obvious abnormalities on inspection of external nose and ears  NECK: normal movements of the head and neck  LUNGS: on inspection no signs of respiratory distress, breathing rate appears normal, no obvious gross SOB, gasping or wheezing  CV: no obvious cyanosis  MS: moves all visible extremities without noticeable abnormality  PSYCH/NEURO: pleasant and cooperative, no obvious depression or anxiety, speech and thought processing grossly intact  ASSESSMENT AND PLAN:  Discussed the following assessment and plan:  Flu-like symptoms  Shortness of breath  Chest discomfort  -we discussed possible serious and likely etiologies, options for evaluation and workup, limitations of telemedicine visit vs in person visit, treatment, treatment risks and precautions. Pt prefers to treat via telemedicine  empirically rather than in person at this moment.  However, given the concerning symptoms she is having, advised prompt in person evaluation right away.  Discussed options for evaluation.  She prefers to take private vehicle to a local urgent care/med center.  She reports her son will drive her.  She agrees to go right away. I discussed the assessment and treatment plan with the patient. The patient was provided an opportunity to ask questions and all were answered. The patient agreed with the plan and demonstrated an understanding of the instructions.     Lucretia Kern, DO

## 2020-12-03 NOTE — ED Triage Notes (Signed)
Pt is present today with centralized chest pain, SOB, bilateral ear pain, sore throat, cough, and fever. Pt states that her sx started last week

## 2020-12-03 NOTE — ED Provider Notes (Signed)
[facility]    CSN: [id number] Arrival date & time: 12/03/20  1833      History   Chief Complaint Chief Complaint  Patient presents with  . Shortness of Breath  . Chest Pain    HPI [name] is a 60 y.o. female.   HPI  Patient presents today with shortness of breath and chest pain and fever.  She had a virtual visit earlier today and was advised if her symptoms worsen to come into urgent care for evaluation.  Patient has a history of OSA, obesity, and has used home oxygen listed on her problem list as needed at nighttime.  She is febrile and having active wheezing.  She report her chest pain is mid chest without radiation.  The pain is present whether she is breathing and deep or not.  She has no known history of cardiovascular disease.  She has been using her albuterol inhaler frequently without any improvement of shortness of breath Past Medical History:  Diagnosis Date  . Arthritis    oa  . Arthropathy, unspecified, site unspecified   . Asthma   . Backache, unspecified   . Bipolar I disorder 06/13/2008   Qualifier: Diagnosis of  By: Burt Knack CMA, Cecille Rubin    . Closed left ankle fracture   . Fibromyalgia   . Generalized anxiety disorder 04/05/2012  . GERD (gastroesophageal reflux disease) 07/05/2008   Qualifier: Diagnosis of  By: Joya Gaskins MD, Burnett Harry   . Hypertension   . Intractable migraine with aura 11/03/2013  . Lumbar radiculopathy 08/30/2012  . Major depressive disorder 09/08/2019  . Major depressive disorder, recurrent, severe with psychotic features 04/05/2012  . Mild neurocognitive disorder, unclear etiology 09/08/2019  . Multiple sclerosis 11/03/2013   remission at present(past hx. bedridden) -ambulates with cane-left side weaker  . Obstructive sleep apnea 06/13/2008   oxygen 2liters prn at hs  . On home oxygen therapy    nightly at 2l/m prn  . PONV (postoperative nausea and vomiting)   . Seizure disorder    last seizure 2014    Patient Active  Problem List   Diagnosis Date Noted  . Status post open reduction with internal fixation (ORIF) of fracture of ankle 08/26/2020  . Open left ankle fracture 07/16/2020  . Chronic respiratory failure with hypoxia ([place]) 05/06/2020  . Schizoaffective disorder, bipolar type ([place]) 10/04/2019  . Mixed hyperlipidemia 10/04/2019  . Vitamin D deficiency 10/04/2019  . Mild neurocognitive disorder, unclear etiology 09/08/2019  . Major depressive disorder   . Female pelvic peritoneal adhesions 12/19/2018  . Galactorrhea not associated with childbirth 12/19/2018  . Heart disease 12/19/2018  . Class 3 severe obesity due to excess calories with serious comorbidity and body mass index (BMI) of 40.0 to 44.9 in adult ([place]) 12/19/2018  . Pain in pelvis 12/19/2018  . Oropharyngeal dysphagia 03/01/2018  . Weakness 09/11/2016  . Seizure disorder ([place]) 12/30/2013  . Disorder of sacrum 11/03/2013  . Hiatal hernia 11/03/2013  . Hypertension 11/03/2013  . Intractable migraine with aura 11/03/2013  . Lumbosacral spondylosis 11/03/2013  . Multiple sclerosis ([place]) 11/03/2013  . Fibromyalgia 11/03/2013  . DDD (degenerative disc disease), lumbosacral 10/03/2013  . Thyroid disease 10/03/2013  . Lumbar radiculopathy 08/30/2012  . Generalized anxiety disorder 04/05/2012  . Elevated LFTs 10/15/2010  . Abdominal pain 09/27/2009  . Asthma, moderate persistent 06/04/2009  . Vocal cord disorder 04/02/2009  . Personal history of allergy to seafood 10/10/2008  . Allergic rhinitis 09/20/2008  . ALLERGY TO INSECTS  AND ARACHNIDS 09/20/2008  . Dysphagia, pharyngoesophageal phase 09/12/2008  . GERD (gastroesophageal reflux disease) 07/05/2008  . Bipolar I disorder ([place]) 06/13/2008  . OSA (obstructive sleep apnea) 06/13/2008  . Arthritis 06/13/2008  . Chronic back pain 06/13/2008  . Hyperglycemia 06/13/2008    Past Surgical History:  Procedure Laterality Date  . APPENDECTOMY  1988   laparoscopic  . BREAST  LUMPECTOMY WITH NEEDLE LOCALIZATION Left 06/06/2015   Procedure: LEFT BREAST LUMPECTOMY WITH NEEDLE LOCALIZATION TIMES TWO;  Surgeon: Erroll Luna, MD;  Location: [place];  Service: General;  Laterality: Left;  . CESAREAN SECTION  1988   twins  . CHOLECYSTECTOMY  yrs ago  . COLONOSCOPY N/A 06/12/2013   Procedure: COLONOSCOPY;  Surgeon: Irene Shipper, MD;  Location: [facility];  Service: Endoscopy;  Laterality: N/A;  . HERNIA REPAIR  yrs ago   umbilical  . ORIF ANKLE FRACTURE Left 07/16/2020   Procedure: OPEN REDUCTION INTERNAL FIXATION (ORIF) ANKLE FRACTURE;  Surgeon: Renette Butters, MD;  Location: [facility];  Service: Orthopedics;  Laterality: Left;  . TUBAL LIGATION  1988  . VAGINAL HYSTERECTOMY  2005   complete    OB History    Gravida  2   Para  2   Term  1   Preterm  1   AB      Living  3     SAB      IAB      Ectopic      Multiple  1   Live Births               Home Medications    Prior to Admission medications   Medication Sig Start Date End Date Taking? Authorizing Provider  acetaminophen (TYLENOL) 500 MG tablet Take 1,000 mg by mouth every 6 (six) hours as needed for moderate pain.    [provider]  albuterol (PROAIR HFA) 108 (90 Base) MCG/ACT inhaler INHALE 2 PUFFS BY MOUTH EVERY 4 HOURS AS NEEDED FOR WHEEZING OR SHORTNESS OF BREATH Patient taking differently: Inhale 2 puffs into the lungs every 4 (four) hours as needed for wheezing or shortness of breath. 07/24/19   Baird Lyons D, MD  albuterol (PROVENTIL) (2.5 MG/3ML) 0.083% nebulizer solution Take 3 mLs by nebulization every 4 (four) hours as needed for wheezing or shortness of breath. 07/18/20   Rachael Fee, PA-C  aspirin EC 81 MG EC tablet Take 1 tablet (81 mg total) by mouth 2 (two) times daily. Swallow whole. 07/18/20   Rachael Fee, PA-C  benztropine (COGENTIN) 0.5 MG tablet Take 0.5 mg by mouth daily.  01/07/18   [provider]  EPINEPHrine 0.3 mg/0.3 mL IJ SOAJ injection Inject 0.3 mg into the muscle daily as needed for anaphylaxis.    [provider]  famotidine (PEPCID) 20 MG tablet Take 1 tablet (20 mg total) by mouth 2 (two) times daily. For stomach acid. Patient taking differently: Take 20 mg by mouth daily. 04/07/12   Readling, Milana Huntsman, MD  FLUoxetine (PROZAC) 40 MG capsule Take 80 mg by mouth daily.    [provider]  Fluticasone-Salmeterol (ADVAIR) 500-50 MCG/DOSE AEPB Inhale 1 puff into the lungs 2 (two) times daily.    [provider]  Fluticasone-Umeclidin-Vilant (TRELEGY ELLIPTA) 100-62.5-25 MCG/INH AEPB Inhale 1 puff into the lungs daily. 10/21/20   Deneise Lever, MD  HYDROcodone-acetaminophen (NORCO/VICODIN) 5-325 MG tablet Take 1 tablet by mouth every 4 (four) hours as needed for  moderate pain. 07/16/20 07/16/21  Rachael Fee, PA-C  HYDROcodone-acetaminophen (NORCO/VICODIN) 5-325 MG tablet Take 1-2 tablets by mouth every 4 (four) hours as needed for moderate pain (pain score 4-6). 07/18/20   Rachael Fee, PA-C  hydrOXYzine (ATARAX/VISTARIL) 25 MG tablet Take 25 mg by mouth 4 (four) times daily as needed for anxiety or itching. 12/18/17   [provider]  lamoTRIgine (LAMICTAL) 200 MG tablet Take 200 mg by mouth at bedtime.    [provider]  metoprolol succinate (TOPROL-XL) 50 MG 24 hr tablet TAKE 1 TABLET(50 MG) BY MOUTH DAILY WITH OR IMMEDIATELY FOLLOWING A MEAL 10/23/20   Billie Ruddy, MD  mirtazapine (REMERON) 15 MG tablet Take 15 mg by mouth at bedtime. 03/01/20   [provider]  Multiple Vitamin (MULTIVITAMIN WITH MINERALS) TABS Take 1 tablet by mouth daily. For nutritional supplementation. Patient taking differently: Take 1 tablet by mouth daily. 04/07/12   Readling, Milana Huntsman, MD  ondansetron (ZOFRAN) 4 MG tablet Take 1 tablet (4 mg total) by mouth every 6 (six) hours as needed for nausea. 07/18/20   Rachael Fee, PA-C   polyethylene glycol powder (GLYCOLAX/MIRALAX) 17 GM/SCOOP powder Take 17 g by mouth daily as needed. 08/07/20   Billie Ruddy, MD  tiZANidine (ZANAFLEX) 4 MG tablet Take 4 mg by mouth 3 (three) times daily as needed for muscle spasms.    [provider]  VITAMIN D PO Take 1 capsule by mouth daily.     [provider]  ziprasidone (GEODON) 20 MG capsule Take 20 mg by mouth 2 (two) times daily with a meal.    [provider]    Family History Family History  Problem Relation Age of Onset  . Heart disease Father   . Ovarian cancer Mother   . Breast cancer Mother   . Alzheimer's disease Sister 56  . Colon cancer Neg Hx   . Esophageal cancer Neg Hx   . Stomach cancer Neg Hx   . Rectal cancer Neg Hx     Social History Social History   Tobacco Use  . Smoking status: Former Smoker    Packs/day: 1.00    Years: 5.00    Pack years: 5.00    Quit date: 07/07/2007    Years since quitting: 13.4  . Smokeless tobacco: Never Used  Vaping Use  . Vaping Use: Never used  Substance Use Topics  . Alcohol use: No  . Drug use: No     Allergies   Bee venom, Depakote [divalproex sodium], Haloperidol lactate, Hornet venom, Latex, Peanut-containing drug products, Penicillins, Quetiapine, Shellfish-derived products, Valproic acid, Amitriptyline hcl, Butorphanol tartrate, Gabapentin, Iodine, Pregabalin, and Terbutaline   Review of Systems Review of Systems Pertinent negatives listed in HPI   Physical Exam Triage Vital Signs ED Triage Vitals  Enc Vitals Group     BP 12/03/20 1842 138/81     Pulse Rate 12/03/20 1842 86     Resp 12/03/20 1842 18     Temp 12/03/20 1849 (!) 100.5 F (38.1 C)     Temp Source 12/03/20 1842 Oral     SpO2 12/03/20 1842 95 %     Weight --      Height --      Head Circumference --      Peak Flow --      Pain Score 12/03/20 1840 10     Pain Loc --      Pain Edu? --  Excl. in GC? --    No data found.  Updated Vital Signs BP  138/81 (BP Location: Left Arm)   Pulse 86   Temp (!) 100.5 F (38.1 C) (Oral)   Resp 18   SpO2 95%   Visual Acuity Right Eye Distance:   Left Eye Distance:   Bilateral Distance:    Right Eye Near:   Left Eye Near:    Bilateral Near:     Physical Exam Constitutional:      General: She is not in acute distress.    Appearance: She is obese. She is ill-appearing. She is not toxic-appearing.  HENT:     Head: Normocephalic and atraumatic.  Cardiovascular:     Rate and Rhythm: Normal rate and regular rhythm.  Pulmonary:     Effort: Tachypnea and accessory muscle usage present.     Breath sounds: Wheezing present. No rhonchi or rales.  Chest:     Chest wall: Tenderness present.    Musculoskeletal:     Cervical back: Normal range of motion.     Right lower leg: No edema.     Left lower leg: No edema.  Psychiatric:        Attention and Perception: Attention normal.        Mood and Affect: Mood normal.        Speech: Speech normal.        Behavior: Behavior normal.      UC Treatments / Results  Labs (all labs ordered are listed, but only abnormal results are displayed) Labs Reviewed - No data to display  EKG NSR, non specific T-wave without ST elevated or depression   Radiology DG Chest 2 View  Result Date: 12/03/2020 CLINICAL DATA:  Chest pain, fever EXAM: CHEST - 2 VIEW COMPARISON:  04/30/2020 FINDINGS: The heart size and mediastinal contours are within normal limits. Both lungs are clear. The visualized skeletal structures are unremarkable. IMPRESSION: No active cardiopulmonary disease. Electronically Signed   By: Rolm Baptise M.D.   On: 12/03/2020 19:08    Procedures Procedures (including critical care time)  Medications Ordered in UC Medications - No data to display  Initial Impression / Assessment and Plan / UC Course  I have reviewed the triage vital signs and the nursing notes.  Pertinent labs & imaging results that were available during my care of the  patient were reviewed by me and considered in my medical decision making (see chart for details).    Chest x-ray negative. Patient is febrile. COVID/Flu negative. ECG negative for any acute changes. Pt has chronic respiratory failure with home oxygen which she wears at bedtime. Oxygen level as high as 98% on RA which is reassuring. Wheezing improved after puffs of albuterol and steroid injection. Treatment per discharge medication orders. Strict ER precautions given. Patient verbalized unde standing and agreement with plan   Final Clinical Impressions(s) / UC Diagnoses   Final diagnoses:  Other chest pain  Fever, unspecified  Shortness of breath   Discharge Instructions   None    ED Prescriptions    None     PDMP not reviewed this encounter.   [place] Jun, FNP 12/05/20 [phone]

## 2020-12-03 NOTE — Patient Instructions (Signed)
Seek in person care right away as we discussed.  If you are having worsening symptoms, severe or life-threatening symptoms or feel that you cannot make it to the urgent care or hospital for evaluation, please call 911 right away.  I hope you are feeling better soon!  It was nice to meet you today. I help [facility] out with telemedicine visits on Tuesdays and Thursdays and am available for visits on those days. If you have any concerns or questions following this visit please schedule a follow up visit with your Primary Care doctor or seek care at a local urgent care clinic to avoid delays in care.

## 2020-12-04 LAB — COVID-19, FLU A+B NAA
Influenza A, NAA: NOT DETECTED
Influenza B, NAA: NOT DETECTED
SARS-CoV-2, NAA: NOT DETECTED

## 2020-12-16 DIAGNOSIS — S82892B Other fracture of left lower leg, initial encounter for open fracture type I or II: Secondary | ICD-10-CM | POA: Diagnosis not present

## 2021-01-13 ENCOUNTER — Ambulatory Visit (INDEPENDENT_AMBULATORY_CARE_PROVIDER_SITE_OTHER): Payer: Medicare HMO

## 2021-01-13 ENCOUNTER — Telehealth: Payer: Self-pay | Admitting: Family Medicine

## 2021-01-13 DIAGNOSIS — Z0000 Encounter for general adult medical examination without abnormal findings: Secondary | ICD-10-CM

## 2021-01-13 DIAGNOSIS — Z1231 Encounter for screening mammogram for malignant neoplasm of breast: Secondary | ICD-10-CM

## 2021-01-13 NOTE — Progress Notes (Signed)
Subjective:   [name] is a 60 y.o. female who presents for an Initial Medicare Annual Wellness Visit. Patient unable to connect no wifi , completed telephone visit.  Virtual Visit via Video Note  I connected with [name] by a video enabled telemedicine application and verified that I am speaking with the correct person using two identifiers.  Location: Patient: Home Provider: Office Persons participating in the virtual visit: patient, provider   I discussed the limitations of evaluation and management by telemedicine and the availability of in person appointments. The patient expressed understanding and agreed to proceed.     Mickel Baas Arush Gatliff,LPN  Review of Systems    N/a       Objective:    There were no vitals filed for this visit. There is no height or weight on file to calculate BMI.  Advanced Directives 07/16/2020 07/16/2020 04/12/2020 01/23/2020 05/02/2019 12/19/2018 09/01/2018  Does Patient Have a Medical Advance Directive? No No No No No No No  Would patient like information on creating a medical advance directive? No - Patient declined No - Patient declined No - Patient declined No - Patient declined No - Patient declined - No - Patient declined  Pre-existing out of facility DNR order (yellow form or pink MOST form) - - - - - - -  Some encounter information is confidential and restricted. Go to Review Flowsheets activity to see all data.    Current Medications (verified) Outpatient Encounter Medications as of 01/13/2021  Medication Sig   acetaminophen (TYLENOL) 500 MG tablet Take 1,000 mg by mouth every 6 (six) hours as needed for moderate pain.   albuterol (PROVENTIL) (2.5 MG/3ML) 0.083% nebulizer solution Take 3 mLs by nebulization every 4 (four) hours as needed for wheezing or shortness of breath.   aspirin EC 81 MG EC tablet Take 1 tablet (81 mg total) by mouth 2 (two) times daily. Swallow whole.   benztropine (COGENTIN) 0.5 MG tablet Take 0.5 mg by  mouth daily.    doxycycline (VIBRAMYCIN) 100 MG capsule Take 1 capsule (100 mg total) by mouth 2 (two) times daily.   EPINEPHrine 0.3 mg/0.3 mL IJ SOAJ injection Inject 0.3 mg into the muscle daily as needed for anaphylaxis.   famotidine (PEPCID) 20 MG tablet Take 1 tablet (20 mg total) by mouth 2 (two) times daily. For stomach acid. (Patient taking differently: Take 20 mg by mouth daily.)   FLUoxetine (PROZAC) 40 MG capsule Take 80 mg by mouth daily.   Fluticasone-Salmeterol (ADVAIR) 500-50 MCG/DOSE AEPB Inhale 1 puff into the lungs 2 (two) times daily.   Fluticasone-Umeclidin-Vilant (TRELEGY ELLIPTA) 100-62.5-25 MCG/INH AEPB Inhale 1 puff into the lungs daily.   HYDROcodone-acetaminophen (NORCO/VICODIN) 5-325 MG tablet Take 1 tablet by mouth every 4 (four) hours as needed for moderate pain.   HYDROcodone-acetaminophen (NORCO/VICODIN) 5-325 MG tablet Take 1-2 tablets by mouth every 4 (four) hours as needed for moderate pain (pain score 4-6).   hydrOXYzine (ATARAX/VISTARIL) 25 MG tablet Take 25 mg by mouth 4 (four) times daily as needed for anxiety or itching.   lamoTRIgine (LAMICTAL) 200 MG tablet Take 200 mg by mouth at bedtime.   metoprolol succinate (TOPROL-XL) 50 MG 24 hr tablet TAKE 1 TABLET(50 MG) BY MOUTH DAILY WITH OR IMMEDIATELY FOLLOWING A MEAL   mirtazapine (REMERON) 15 MG tablet Take 15 mg by mouth at bedtime.   Multiple Vitamin (MULTIVITAMIN WITH MINERALS) TABS Take 1 tablet by mouth daily. For nutritional supplementation. (Patient taking differently: Take 1 tablet by  mouth daily.)   ondansetron (ZOFRAN) 4 MG tablet Take 1 tablet (4 mg total) by mouth every 6 (six) hours as needed for nausea.   polyethylene glycol powder (GLYCOLAX/MIRALAX) 17 GM/SCOOP powder Take 17 g by mouth daily as needed.   predniSONE (DELTASONE) 20 MG tablet Take 2 tablets (40 mg total) by mouth daily with breakfast.   tiZANidine (ZANAFLEX) 4 MG tablet Take 4 mg by mouth 3 (three) times daily as needed for muscle  spasms.   VITAMIN D PO Take 1 capsule by mouth daily.    ziprasidone (GEODON) 20 MG capsule Take 20 mg by mouth 2 (two) times daily with a meal.   No facility-administered encounter medications on file as of 01/13/2021.    Allergies (verified) Bee venom, Depakote [divalproex sodium], Haloperidol lactate, Hornet venom, Latex, Peanut-containing drug products, Penicillins, Quetiapine, Shellfish-derived products, Valproic acid, Amitriptyline hcl, Butorphanol tartrate, Gabapentin, Iodine, Pregabalin, and Terbutaline   History: Past Medical History:  Diagnosis Date   Arthritis    oa   Arthropathy, unspecified, site unspecified    Asthma    Backache, unspecified    Bipolar I disorder 06/13/2008   Qualifier: Diagnosis of  By: Burt Knack CMA, Lori     Closed left ankle fracture    Fibromyalgia    Generalized anxiety disorder 04/05/2012   GERD (gastroesophageal reflux disease) 07/05/2008   Qualifier: Diagnosis of  By: Joya Gaskins MD, Burnett Harry    Hypertension    Intractable migraine with aura 11/03/2013   Lumbar radiculopathy 08/30/2012   Major depressive disorder 09/08/2019   Major depressive disorder, recurrent, severe with psychotic features 04/05/2012   Mild neurocognitive disorder, unclear etiology 09/08/2019   Multiple sclerosis 11/03/2013   remission at present(past hx. bedridden) -ambulates with cane-left side weaker   Obstructive sleep apnea 06/13/2008   oxygen 2liters prn at hs   On home oxygen therapy    nightly at 2l/m prn   PONV (postoperative nausea and vomiting)    Seizure disorder    last seizure 2014   Past Surgical History:  Procedure Laterality Date   APPENDECTOMY  1988   laparoscopic   BREAST LUMPECTOMY WITH NEEDLE LOCALIZATION Left 06/06/2015   Procedure: LEFT BREAST LUMPECTOMY WITH NEEDLE LOCALIZATION TIMES TWO;  Surgeon: Erroll Luna, MD;  Location: [facility];  Service: General;  Laterality: Left;   [place]   twins   CHOLECYSTECTOMY  yrs ago    COLONOSCOPY N/A 06/12/2013   Procedure: COLONOSCOPY;  Surgeon: Irene Shipper, MD;  Location: [facility];  Service: Endoscopy;  Laterality: N/A;   HERNIA REPAIR  yrs ago   umbilical   ORIF ANKLE FRACTURE Left 07/16/2020   Procedure: OPEN REDUCTION INTERNAL FIXATION (ORIF) ANKLE FRACTURE;  Surgeon: Renette Butters, MD;  Location: [facility];  Service: Orthopedics;  Laterality: Left;   TUBAL LIGATION  1988   VAGINAL HYSTERECTOMY  2005   complete   Family History  Problem Relation Age of Onset   Heart disease Father    Ovarian cancer Mother    Breast cancer Mother    Alzheimer's disease Sister 53   Colon cancer Neg Hx    Esophageal cancer Neg Hx    Stomach cancer Neg Hx    Rectal cancer Neg Hx    Social History   Socioeconomic History   Marital status: Divorced    Spouse name: Not on file   Number of children: 3   Years of education: 14   Highest education level:  Some college, no degree  Occupational History   Occupation: Disabled  Tobacco Use   Smoking status: Former    Packs/day: 1.00    Years: 5.00    Pack years: 5.00    Types: Cigarettes    Quit date: 07/07/2007    Years since quitting: 13.5   Smokeless tobacco: Never  Vaping Use   Vaping Use: Never used  Substance and Sexual Activity   Alcohol use: No   Drug use: No   Sexual activity: Not Currently    Birth control/protection: Surgical    Comment: X 1 year  Other Topics Concern   Not on file  Social History Narrative   Single, 3 children   Right handed   Associates degree   occ 1 cup daily      09/01/2018: Lives with sister, Rise Paganini, who helps manage medications, in 2 story house, and a pit bull   Has one daughter in [place], 2 sons in [place]; 7 grandchildren   Currently not exercising, but wants to return to water aerobics at [place] Strain: Not on file  Food Insecurity: Not on file  Transportation Needs: Not on file  Physical Activity: Not on  file  Stress: Not on file  Social Connections: Not on file    Tobacco Counseling Counseling given: Not Answered   Clinical Intake:                 Diabetic?no         Activities of Daily Living In your present state of health, do you have any difficulty performing the following activities: 07/16/2020 07/16/2020  Hearing? N N  Vision? N N  Difficulty concentrating or making decisions? N N  Walking or climbing stairs? Y Y  Dressing or bathing? N N  Doing errands, shopping? N -  Some recent data might be hidden    Patient Care Team: Billie Ruddy, MD as PCP - General (Family Medicine) Elsie Stain, MD (Pulmonary Disease) Ena Dawley, MD as Consulting Physician (Obstetrics and Gynecology) Pieter Partridge, DO as Consulting Physician (Neurology)  Indicate any recent Medical Services you may have received from other than [facility] providers in the past year (date may be approximate).     Assessment:   This is a routine wellness examination for [place].  Hearing/Vision screen No results found.  Dietary issues and exercise activities discussed:     Goals Addressed   None    Depression Screen PHQ 2/9 Scores 10/04/2019 09/01/2018  PHQ - 2 Score 1 2  PHQ- 9 Score 6 9    Fall Risk Fall Risk  01/23/2020 05/02/2019 12/19/2018 09/14/2018 09/01/2018  Falls in the past year? 1 1 1 1 1   Number falls in past yr: 1 0 1 1 1   Injury with Fall? 1 1 1  0 -  Risk for fall due to : Impaired balance/gait - - - Impaired balance/gait;Impaired vision;Impaired mobility;History of fall(s);Medication side effect  Follow up - - - Falls evaluation completed Education provided;Falls prevention discussed;Follow up appointment  Comment - - - - pt. to see neurology 3/11    FALL RISK PREVENTION PERTAINING TO THE HOME:  Any stairs in or around the home? No  If so, are there any without handrails? Yes  Home free of loose throw rugs in walkways, pet beds, electrical cords, etc? Yes   Adequate lighting in your home to reduce risk of falls? Yes   ASSISTIVE DEVICES UTILIZED  TO PREVENT FALLS:  Life alert? No  Use of a cane, walker or w/c? Yes  Grab bars in the bathroom? Yes  Shower chair or bench in shower? No  Elevated toilet seat or a handicapped toilet? No    Cognitive Function: Normal cognitive status assessed by direct observation by this Nurse Health Advisor. No abnormalities found.   MMSE - Mini Mental State Exam 09/14/2018 09/01/2018  Orientation to time 4 4  Orientation to Place 5 5  Registration 3 2  Attention/ Calculation 3 0  Recall 1 1  Language- name 2 objects 2 2  Language- repeat 0 1  Language- follow 3 step command 2 1  Language- read & follow direction 1 1  Write a sentence 1 1  Copy design 0 0  Total score 22 18        Immunizations Immunization History  Administered Date(s) Administered   Influenza Split 04/06/2012, 03/06/2013   Influenza Whole 04/08/2009, 04/05/2010   Influenza,inj,Quad PF,6+ Mos 03/22/2013, 09/01/2018, 04/24/2020   PFIZER(Purple Top)SARS-COV-2 Vaccination 09/30/2019, 10/21/2019   Pneumococcal Polysaccharide-23 04/15/2009, 07/07/2011, 04/06/2012    TDAP status: Due, Education has been provided regarding the importance of this vaccine. Advised may receive this vaccine at local pharmacy or Health Dept. Aware to provide a copy of the vaccination record if obtained from local pharmacy or Health Dept. Verbalized acceptance and understanding.  Flu Vaccine status: Up to date  Pneumococcal vaccine status: Up to date  Covid-19 vaccine status: Completed vaccines  Qualifies for Shingles Vaccine? Yes   Zostavax completed No   Shingrix Completed?: No.    Education has been provided regarding the importance of this vaccine. Patient has been advised to call insurance company to determine out of pocket expense if they have not yet received this vaccine. Advised may also receive vaccine at local pharmacy or Health Dept.  Verbalized acceptance and understanding.  Screening Tests Health Maintenance  Topic Date Due   TETANUS/TDAP  Never done   Zoster Vaccines- Shingrix (1 of 2) Never done   COVID-19 Vaccine (3 - Booster for Pfizer series) 03/22/2020   INFLUENZA VACCINE  02/03/2021   MAMMOGRAM  12/03/2021   COLONOSCOPY (Pts 45-39yrs Insurance coverage will need to be confirmed)  06/13/2023   Hepatitis C Screening  Completed   HIV Screening  Completed   Pneumococcal Vaccine 73-38 Years old  Aged Out   HPV VACCINES  Aged Out   PAP SMEAR-Modifier  Discontinued    Health Maintenance  Health Maintenance Due  Topic Date Due   TETANUS/TDAP  Never done   Zoster Vaccines- Shingrix (1 of 2) Never done   COVID-19 Vaccine (3 - Booster for Pfizer series) 03/22/2020    Colorectal cancer screening: Type of screening: Colonoscopy. Completed 06/12/2013. Repeat every 10 years  Mammogram status: Ordered 01/13/2021. Pt provided with contact info and advised to call to schedule appt.   Bone Density status: Completed only 59 not ordered . Results reflect: Bone density results: NORMAL. Repeat every not old enough years.  Lung Cancer Screening: (Low Dose CT Chest recommended if Age 64-80 years, 30 pack-year currently smoking OR have quit w/in 15years.) does not qualify.   Lung Cancer Screening Referral: n/a  Additional Screening:  Hepatitis C Screening: does not qualify; Completed   Vision Screening: Recommended annual ophthalmology exams for early detection of glaucoma and other disorders of the eye. Is the patient up to date with their annual eye exam?  Yes  Who is the provider or what is the name  of the office in which the patient attends annual eye exams? Dr.heckler If pt is not established with a provider, would they like to be referred to a provider to establish care? No .   Dental Screening: Recommended annual dental exams for proper oral hygiene  Community Resource Referral / Chronic Care Management: CRR  required this visit?  No   CCM required this visit?  No      Plan:     I have personally reviewed and noted the following in the patient's chart:   Medical and social history Use of alcohol, tobacco or illicit drugs  Current medications and supplements including opioid prescriptions. Patient is not currently taking opioid prescriptions. Functional ability and status Nutritional status Physical activity Advanced directives List of other physicians Hospitalizations, surgeries, and ER visits in previous 12 months Vitals Screenings to include cognitive, depression, and falls Referrals and appointments  In addition, I have reviewed and discussed with patient certain preventive protocols, quality metrics, and best practice recommendations. A written personalized care plan for preventive services as well as general preventive health recommendations were provided to patient.     Randel Pigg, LPN   6/60/6004   Nurse Notes: none

## 2021-01-13 NOTE — Telephone Encounter (Signed)
The patient needs a refill on ibuprofen (ADVIL) 600 MG tablet   [id number] [address] [place], [place] [address] Phone:  [phone]  Fax:  [phone]

## 2021-01-13 NOTE — Patient Instructions (Signed)
[name] , Thank you for taking time to come for your Medicare Wellness Visit. I appreciate your ongoing commitment to your health goals. Please review the following plan we discussed and let me know if I can assist you in the future.   Screening recommendations/referrals: Colonoscopy: 06/12/2013  due 2024 Mammogram: order completed 01/03/2021 Bone Density: age 60  Recommended yearly ophthalmology/optometry visit for glaucoma screening and checkup Recommended yearly dental visit for hygiene and checkup  Vaccinations: Influenza vaccine: due in fall 2022 Pneumococcal vaccine: due 2nd dose Tdap vaccine: will obtain local  pharmacy  Shingles vaccine: will obtain local pharmacy     Advanced directives: will provide copies   Conditions/risks identified: none   Next appointment: none   Preventive Care 40-64 Years, Female Preventive care refers to lifestyle choices and visits with your health care provider that can promote health and wellness. What does preventive care include? A yearly physical exam. This is also called an annual well check. Dental exams once or twice a year. Routine eye exams. Ask your health care provider how often you should have your eyes checked. Personal lifestyle choices, including: Daily care of your teeth and gums. Regular physical activity. Eating a healthy diet. Avoiding tobacco and drug use. Limiting alcohol use. Practicing safe sex. Taking low-dose aspirin daily starting at age 29. Taking vitamin and mineral supplements as recommended by your health care provider. What happens during an annual well check? The services and screenings done by your health care provider during your annual well check will depend on your age, overall health, lifestyle risk factors, and family history of disease. Counseling  Your health care provider may ask you questions about your: Alcohol use. Tobacco use. Drug use. Emotional well-being. Home and relationship  well-being. Sexual activity. Eating habits. Work and work Statistician. Method of birth control. Menstrual cycle. Pregnancy history. Screening  You may have the following tests or measurements: Height, weight, and BMI. Blood pressure. Lipid and cholesterol levels. These may be checked every 5 years, or more frequently if you are over 60 years old. Skin check. Lung cancer screening. You may have this screening every year starting at age 82 if you have a 30-pack-year history of smoking and currently smoke or have quit within the past 15 years. Fecal occult blood test (FOBT) of the stool. You may have this test every year starting at age 67. Flexible sigmoidoscopy or colonoscopy. You may have a sigmoidoscopy every 5 years or a colonoscopy every 10 years starting at age 44. Hepatitis C blood test. Hepatitis B blood test. Sexually transmitted disease (STD) testing. Diabetes screening. This is done by checking your blood sugar (glucose) after you have not eaten for a while (fasting). You may have this done every 1-3 years. Mammogram. This may be done every 1-2 years. Talk to your health care provider about when you should start having regular mammograms. This may depend on whether you have a family history of breast cancer. BRCA-related cancer screening. This may be done if you have a family history of breast, ovarian, tubal, or peritoneal cancers. Pelvic exam and Pap test. This may be done every 3 years starting at age 63. Starting at age 38, this may be done every 5 years if you have a Pap test in combination with an HPV test. Bone density scan. This is done to screen for osteoporosis. You may have this scan if you are at high risk for osteoporosis. Discuss your test results, treatment options, and if necessary, the need for  more tests with your health care provider. Vaccines  Your health care provider may recommend certain vaccines, such as: Influenza vaccine. This is recommended every  year. Tetanus, diphtheria, and acellular pertussis (Tdap, Td) vaccine. You may need a Td booster every 10 years. Zoster vaccine. You may need this after age 37. Pneumococcal 13-valent conjugate (PCV13) vaccine. You may need this if you have certain conditions and were not previously vaccinated. Pneumococcal polysaccharide (PPSV23) vaccine. You may need one or two doses if you smoke cigarettes or if you have certain conditions. Talk to your health care provider about which screenings and vaccines you need and how often you need them. This information is not intended to replace advice given to you by your health care provider. Make sure you discuss any questions you have with your health care provider. Document Released: 07/19/2015 Document Revised: 03/11/2016 Document Reviewed: 04/23/2015 Elsevier Interactive Patient Education  2017 [place] Prevention in the Home Falls can cause injuries. They can happen to people of all ages. There are many things you can do to make your home safe and to help prevent falls. What can I do on the outside of my home? Regularly fix the edges of walkways and driveways and fix any cracks. Remove anything that might make you trip as you walk through a door, such as a raised step or threshold. Trim any bushes or trees on the path to your home. Use bright outdoor lighting. Clear any walking paths of anything that might make someone trip, such as rocks or tools. Regularly check to see if handrails are loose or broken. Make sure that both sides of any steps have handrails. Any raised decks and porches should have guardrails on the edges. Have any leaves, snow, or ice cleared regularly. Use sand or salt on walking paths during winter. Clean up any spills in your garage right away. This includes oil or grease spills. What can I do in the bathroom? Use night lights. Install grab bars by the toilet and in the tub and shower. Do not use towel bars as grab  bars. Use non-skid mats or decals in the tub or shower. If you need to sit down in the shower, use a plastic, non-slip stool. Keep the floor dry. Clean up any water that spills on the floor as soon as it happens. Remove soap buildup in the tub or shower regularly. Attach bath mats securely with double-sided non-slip rug tape. Do not have throw rugs and other things on the floor that can make you trip. What can I do in the bedroom? Use night lights. Make sure that you have a light by your bed that is easy to reach. Do not use any sheets or blankets that are too big for your bed. They should not hang down onto the floor. Have a firm chair that has side arms. You can use this for support while you get dressed. Do not have throw rugs and other things on the floor that can make you trip. What can I do in the kitchen? Clean up any spills right away. Avoid walking on wet floors. Keep items that you use a lot in easy-to-reach places. If you need to reach something above you, use a strong step stool that has a grab bar. Keep electrical cords out of the way. Do not use floor polish or wax that makes floors slippery. If you must use wax, use non-skid floor wax. Do not have throw rugs and other things  on the floor that can make you trip. What can I do with my stairs? Do not leave any items on the stairs. Make sure that there are handrails on both sides of the stairs and use them. Fix handrails that are broken or loose. Make sure that handrails are as long as the stairways. Check any carpeting to make sure that it is firmly attached to the stairs. Fix any carpet that is loose or worn. Avoid having throw rugs at the top or bottom of the stairs. If you do have throw rugs, attach them to the floor with carpet tape. Make sure that you have a light switch at the top of the stairs and the bottom of the stairs. If you do not have them, ask someone to add them for you. What else can I do to help prevent  falls? Wear shoes that: Do not have high heels. Have rubber bottoms. Are comfortable and fit you well. Are closed at the toe. Do not wear sandals. If you use a stepladder: Make sure that it is fully opened. Do not climb a closed stepladder. Make sure that both sides of the stepladder are locked into place. Ask someone to hold it for you, if possible. Clearly mark and make sure that you can see: Any grab bars or handrails. First and last steps. Where the edge of each step is. Use tools that help you move around (mobility aids) if they are needed. These include: Canes. Walkers. Scooters. Crutches. Turn on the lights when you go into a dark area. Replace any light bulbs as soon as they burn out. Set up your furniture so you have a clear path. Avoid moving your furniture around. If any of your floors are uneven, fix them. If there are any pets around you, be aware of where they are. Review your medicines with your doctor. Some medicines can make you feel dizzy. This can increase your chance of falling. Ask your doctor what other things that you can do to help prevent falls. This information is not intended to replace advice given to you by your health care provider. Make sure you discuss any questions you have with your health care provider. Document Released: 04/18/2009 Document Revised: 11/28/2015 Document Reviewed: 07/27/2014 Elsevier Interactive Patient Education  2017 [employer].

## 2021-01-15 DIAGNOSIS — S82892B Other fracture of left lower leg, initial encounter for open fracture type I or II: Secondary | ICD-10-CM | POA: Diagnosis not present

## 2021-01-21 ENCOUNTER — Telehealth: Payer: Self-pay | Admitting: Family Medicine

## 2021-01-21 DIAGNOSIS — R69 Illness, unspecified: Secondary | ICD-10-CM | POA: Diagnosis not present

## 2021-01-21 NOTE — Telephone Encounter (Signed)
Pt is calling in stating that she needs a refill on Ibuprofen 600 MG  Pharm:  [facility] [address]

## 2021-01-21 NOTE — Telephone Encounter (Signed)
Okay to refill? Not on med list.

## 2021-01-23 ENCOUNTER — Encounter: Payer: Medicare HMO | Admitting: Psychology

## 2021-01-23 NOTE — Telephone Encounter (Signed)
Available OTC

## 2021-01-28 ENCOUNTER — Telehealth: Payer: Self-pay | Admitting: Family Medicine

## 2021-01-28 DIAGNOSIS — I10 Essential (primary) hypertension: Secondary | ICD-10-CM

## 2021-01-28 MED ORDER — METOPROLOL SUCCINATE ER 50 MG PO TB24: ORAL_TABLET | ORAL | 0 refills | Status: DC

## 2021-01-28 NOTE — Telephone Encounter (Signed)
Spoke with patient, advised needs physical and 90 day of metoprolol succinate was sent in to pharmacy.

## 2021-01-28 NOTE — Telephone Encounter (Signed)
Pt is calling in to get a refill on Rx's metoprolol succinate (TOPROL-XL) 50 MG and ibuprofen (MOTRIN) 600 MG   Pharm:  [facility] on [address].

## 2021-01-30 ENCOUNTER — Encounter: Payer: Medicare HMO | Admitting: Psychology

## 2021-02-03 NOTE — Progress Notes (Signed)
NEUROLOGY FOLLOW UP OFFICE NOTE  [name] [id number]  Assessment/Plan:   Mild neurocognitive disorder, unclear etiology.  She has psychiatric comorbidities but has family history of Alzheimer's Seizure disorder, stable.  EEG normal.  Off AED. Migraine without aura, without status migrainosus, not intractable White matter lesions on brain MRI.  Prior diagnosis of MS.  Never was on DMT.  My assessment is that findings are chronic small vessel ischemic changes and not demyelinating disease Elevated blood pressure  Will repeat neuropsychological evaluation.   Follow up in one year or sooner pending neuropsychological evaluation results. Follow up with PCP regarding blood pressure   Subjective:  [name] is a 60 year old right-handed woman with seizure disorder, questionable multiple sclerosis, fibromyalgia, Bipolar disorder, depression and OSA who follows up for seizure disorder, migraines and MCI.   UPDATE: Formal eye exam in March showed worsening nuclear sclerosis and cataract in the left eye.  Underwent cataract surgery.  She is doing well.      Over the past year, she reports slight worsening in memory.  She reports that sometimes she doesn't recognize close family members.  She may not remember being in houses that she has been to in the past.  She lives with two of her sisters.  Her sister and daughter manage her bills and finances.  She sometimes sees people in the house who aren't there.  Sometimes, she makes paraphasic errors.  She may call a tree by a different name.  She does not drive.  Appetite is good.  Sleep is variable.        HISTORY: I  SEIZURE DISORDER: She has had history of seizures since childhood.  Previously took phenobarbital until age 13.  She was seizure-free for years until December 2014, in which she had 2 suspected seizures followed by a third seizure in February 2015.  She was noted by her sister to be unresponsive, shaking with eyes rolled  back with tongue biting lasting 3 minutes.  No bowel or bladder incontinence.  Afterwards, she was confused and agitated.  She was started on Keppra '500mg'$  twice daily.  No recurrent seizures since 2015.  Keppra was subsequently discontinued after several years.  She had a repeat MRI of brain on 01/20/2019 which demonstrated mild generalized atrophy and again mild nonspecific cerebral white matter disease, similar to prior MRI from 10/18/2013. Awake and asleep EEG from 05/05/2019 was normal.  Therefore, Keppra was not restarted.   II QUESTIONABLE HISTORY OF MULTIPLE SCLEROSIS, UNLIKELY.  MRI BRAIN FINDINGS MORE CONSISTENT WITH SMALL VESSEL DISEASE:  She was diagnosed with MS in 2000 in [place] after experiencing visual changes, unsteady gait and difficulty with fine motor skills.  She took Betaseron until 2005.  MRI of brain with and without contrast from 11/03/04 reportedly demonstrated "no lesion indentified in the brain which would be characteristic for multiple sclerosis."  MRI of brain with and without contrast from 01/29/11 was personally reviewed and demonstrated mild non-enhancing nonspecific punctate T2 hyperintensities in the subcortical white matter, mildly increased on repeat study on 10/18/13.  She was admitted to [facility] on 09/11/16 for bilateral lower extremity weakness with LLQ abdominal pain and nausea.  MRI of cervical, thoracic and lumbar spine were personally reviewed and demonstrated no demyelinating spinal cord disease with mild disc bulging and facet hypertrophy at L3-4, L4-5 and L5-S1, but no significant spinal stenosis.  HIV testing was negative.   III SUBJECTIVE MEMORY DEFICITS: She started having short-term memory problems in  December 2019, however I presume symptoms started earlier.  At the time, her family noticed the problem.  She will lose her train of thought mid-sentence or she will forget why she walked into a room.  She is told that she keeps repeating questions.  She  lives with her sister.  Her sister took away her car keys because she got confused driving on a familiar route.  One time, she gave a cab driver an extra [id number].  She is now having trouble paying her bills and requires help from her sister.  She reports needing help bathing and getting dress.  She started putting on clothes backwards.  Her daughter got married in Spring 2019 and she cannot remember the wedding.  She reports hallucinations.  She woke up from a dream and turned over to briefly find a baby in her bed.  She underwent neuropsychological testing at [facility] on 11/23/18.  She did demonstrated reduced effort but no clear evidence of Alzheimer's disease or impairment often associated with MS.  Likely subjective memory deficits related to psychogenic factors but ultimately unclear.  She underwent repeat neuropsychological testing on 09/07/2019 demonstrated diffuse mild neurocognitive disorder of unclear etiology, possibly due to psychiatric factors as she demonstrated severe anxiety and moderate depression, but not definite.  She appeared to exhibit visual deficits on exam, skipping many items unintentionally and often circled responses of the bottom half of one answer and the top half of another answer.     Labs from 07/20/18 include B12 479, folate 17.8, TSH 0.43.  CBC and CMP were unremarkable.   Both of her parents had Alzheimer's disease.  IV CHRONIC LOW-BACK PAIN: She has chronic right sided low back pain radiating down the right leg.  MRI of lumbar spine in 2018 demonstrated mild disc bulging and facet hypertrophy at L3-4, L4-5 and L5-S1.  She was evaluated by orthopedics at [facility] and underwent conservative management.  Repeat MRI lumbar spine from 01/15/2020 showed interval progression of multilevel spondylosis with central L5-S1 disc protrusion abutting left greater than right S1 nerve roots and left extraforaminal L4-5 annular fissuring.  She is undergoing injections.              V  HEADACHES: She has history of migraines but headaches started getting worse in September 2019.  She reports severe frontal or temporal pressure pain.  No preceding aura.  There is associated nausea, photophobia but no vomiting, phonophobia, visual disturbance or unilateral numbness or weakness.  They usually last 3 to 4 hours.  They are infrequent and she hasn't had one in several months.  She usually treats it with Motrin and Flexeril.   Current NSAIDs:  Motrin Current analgesics:  none Current muscle relaxant:  Flexeril Current antihypertensive:  metoprolol Current antidepressant:  Doxepin '20mg'$  at bedtime, Prozac Current antiepileptic:  Lamotrigine '25mg'$  at bedtime Current sleep aid:  Trazodone Other medications:  Trazodone, Stelazine, Cogentin   Past antiemetic:  Reglan Past muscle relaxant:  Robaxin Past antidepressant:  Cymbalta, amitriptyline Past antiepileptic:  topiramate '100mg'$  twice daily, gabapentin Other past medications:  Latuda, Seroquel, Sonata, Ambien  PAST MEDICAL HISTORY: Past Medical History:  Diagnosis Date   Arthritis    oa   Arthropathy, unspecified, site unspecified    Asthma    Backache, unspecified    Bipolar I disorder 06/13/2008   Qualifier: Diagnosis of  By: Burt Knack CMA, Lori     Closed left ankle fracture    Fibromyalgia    Generalized anxiety disorder 04/05/2012  GERD (gastroesophageal reflux disease) 07/05/2008   Qualifier: Diagnosis of  By: Joya Gaskins MD, Burnett Harry    Hypertension    Intractable migraine with aura 11/03/2013   Lumbar radiculopathy 08/30/2012   Major depressive disorder 09/08/2019   Major depressive disorder, recurrent, severe with psychotic features 04/05/2012   Mild neurocognitive disorder, unclear etiology 09/08/2019   Multiple sclerosis 11/03/2013   remission at present(past hx. bedridden) -ambulates with cane-left side weaker   Obstructive sleep apnea 06/13/2008   oxygen 2liters prn at hs   On home oxygen therapy    nightly at 2l/m  prn   PONV (postoperative nausea and vomiting)    Seizure disorder    last seizure 2014    MEDICATIONS: Current Outpatient Medications on File Prior to Visit  Medication Sig Dispense Refill   acetaminophen (TYLENOL) 500 MG tablet Take 1,000 mg by mouth every 6 (six) hours as needed for moderate pain.     albuterol (PROVENTIL) (2.5 MG/3ML) 0.083% nebulizer solution Take 3 mLs by nebulization every 4 (four) hours as needed for wheezing or shortness of breath. 75 mL 12   aspirin EC 81 MG EC tablet Take 1 tablet (81 mg total) by mouth 2 (two) times daily. Swallow whole. 30 tablet 11   benztropine (COGENTIN) 0.5 MG tablet Take 0.5 mg by mouth daily.      doxycycline (VIBRAMYCIN) 100 MG capsule Take 1 capsule (100 mg total) by mouth 2 (two) times daily. 20 capsule 0   EPINEPHrine 0.3 mg/0.3 mL IJ SOAJ injection Inject 0.3 mg into the muscle daily as needed for anaphylaxis.     famotidine (PEPCID) 20 MG tablet Take 1 tablet (20 mg total) by mouth 2 (two) times daily. For stomach acid. (Patient taking differently: Take 20 mg by mouth daily.) 60 tablet 0   FLUoxetine (PROZAC) 40 MG capsule Take 80 mg by mouth daily.     Fluticasone-Salmeterol (ADVAIR) 500-50 MCG/DOSE AEPB Inhale 1 puff into the lungs 2 (two) times daily.     Fluticasone-Umeclidin-Vilant (TRELEGY ELLIPTA) 100-62.5-25 MCG/INH AEPB Inhale 1 puff into the lungs daily. 28 each 0   HYDROcodone-acetaminophen (NORCO/VICODIN) 5-325 MG tablet Take 1 tablet by mouth every 4 (four) hours as needed for moderate pain. 20 tablet 0   HYDROcodone-acetaminophen (NORCO/VICODIN) 5-325 MG tablet Take 1-2 tablets by mouth every 4 (four) hours as needed for moderate pain (pain score 4-6). 30 tablet 0   hydrOXYzine (ATARAX/VISTARIL) 25 MG tablet Take 25 mg by mouth 4 (four) times daily as needed for anxiety or itching.  2   lamoTRIgine (LAMICTAL) 200 MG tablet Take 200 mg by mouth at bedtime.     metoprolol succinate (TOPROL-XL) 50 MG 24 hr tablet TAKE 1  TABLET(50 MG) BY MOUTH DAILY WITH OR IMMEDIATELY FOLLOWING A MEAL 90 tablet 0   mirtazapine (REMERON) 15 MG tablet Take 15 mg by mouth at bedtime.     Multiple Vitamin (MULTIVITAMIN WITH MINERALS) TABS Take 1 tablet by mouth daily. For nutritional supplementation. (Patient taking differently: Take 1 tablet by mouth daily.) 30 tablet 0   ondansetron (ZOFRAN) 4 MG tablet Take 1 tablet (4 mg total) by mouth every 6 (six) hours as needed for nausea. 20 tablet 0   polyethylene glycol powder (GLYCOLAX/MIRALAX) 17 GM/SCOOP powder Take 17 g by mouth daily as needed. 1350 g 3   predniSONE (DELTASONE) 20 MG tablet Take 2 tablets (40 mg total) by mouth daily with breakfast. 10 tablet 0   tiZANidine (ZANAFLEX) 4 MG tablet Take 4 mg  by mouth 3 (three) times daily as needed for muscle spasms.     VITAMIN D PO Take 1 capsule by mouth daily.      ziprasidone (GEODON) 20 MG capsule Take 20 mg by mouth 2 (two) times daily with a meal.     No current facility-administered medications on file prior to visit.    ALLERGIES: Allergies  Allergen Reactions   Bee Venom Anaphylaxis   Depakote [Divalproex Sodium] Shortness Of Breath and Rash   Haloperidol Lactate Swelling   Hornet Venom Anaphylaxis   Latex Anaphylaxis    mild rash/itching, wheezing/sob   Peanut-Containing Drug Products Anaphylaxis   Penicillins Anaphylaxis and Swelling   Quetiapine Anaphylaxis and Other (See Comments)   Shellfish-Derived Products Anaphylaxis   Valproic Acid Swelling   Amitriptyline Hcl Other (See Comments)    HALLUCINATIONS   Butorphanol Tartrate Other (See Comments)    Hallucinations   Gabapentin Other (See Comments)    Pt reports severe back and side pain   Iodine     Flushing and fainting   Pregabalin Rash   Terbutaline Rash    FAMILY HISTORY: Family History  Problem Relation Age of Onset   Heart disease Father    Ovarian cancer Mother    Breast cancer Mother    Alzheimer's disease Sister 5   Colon cancer Neg  Hx    Esophageal cancer Neg Hx    Stomach cancer Neg Hx    Rectal cancer Neg Hx       Objective:  Blood pressure (!) 174/94, pulse 91, height '5\' 9"'$  (1.753 m), weight 258 lb 9.6 oz (117.3 kg), SpO2 99 %. General: No acute distress.  Patient appears well-groomed.   Head:  Normocephalic/atraumatic Eyes:  Fundi examined but not visualized Neck: supple, no paraspinal tenderness, full range of motion Heart:  Regular rate and rhythm Lungs:  Clear to auscultation bilaterally Back: No paraspinal tenderness Neurological Exam:  [employer] Mental Exam 02/04/2021 02/04/2021  Weekday Correct 0 0  Current year - 1  What state are we in? - 1  Amount spent - 1  Amount left - 0  # of Animals - 1  5 objects recall - 1  Number series - 1  Hour markers - 2  Time correct - 0  Placed X in triangle correctly - 1  Largest Figure - 1  Name of female - 2  Date back to work - 0  Type of work - [employer] she lived in - 2  Total score - 14   CN II-XII intact. Bulk and tone normal, muscle strength 5/5 throughout.  Sensation to light touch intact.  Deep tendon reflexes 2+ throughout, toes downgoing.  Finger to nose testing intact.  Gait normal, Romberg negative.   Metta Clines, DO  CC: Grier Mitts, MD

## 2021-02-04 ENCOUNTER — Ambulatory Visit: Payer: Medicare HMO | Admitting: Neurology

## 2021-02-04 ENCOUNTER — Other Ambulatory Visit: Payer: Self-pay

## 2021-02-04 ENCOUNTER — Encounter: Payer: Self-pay | Admitting: Neurology

## 2021-02-04 VITALS — BP 174/94 | HR 91 | Ht 69.0 in | Wt 258.6 lb

## 2021-02-04 DIAGNOSIS — R030 Elevated blood-pressure reading, without diagnosis of hypertension: Secondary | ICD-10-CM | POA: Diagnosis not present

## 2021-02-04 DIAGNOSIS — G43009 Migraine without aura, not intractable, without status migrainosus: Secondary | ICD-10-CM | POA: Diagnosis not present

## 2021-02-04 DIAGNOSIS — Z8669 Personal history of other diseases of the nervous system and sense organs: Secondary | ICD-10-CM | POA: Diagnosis not present

## 2021-02-04 DIAGNOSIS — G3184 Mild cognitive impairment, so stated: Secondary | ICD-10-CM

## 2021-02-04 DIAGNOSIS — R9082 White matter disease, unspecified: Secondary | ICD-10-CM

## 2021-02-04 NOTE — Patient Instructions (Signed)
I would like to repeat neurocognitive testing.  Follow up in one year or sooner pending the testing.

## 2021-02-15 DIAGNOSIS — S82892B Other fracture of left lower leg, initial encounter for open fracture type I or II: Secondary | ICD-10-CM | POA: Diagnosis not present

## 2021-03-18 ENCOUNTER — Ambulatory Visit
Admission: RE | Admit: 2021-03-18 | Discharge: 2021-03-18 | Disposition: A | Discharge status: Home / Self Care | Payer: Medicare HMO | Source: Ambulatory Visit | Attending: Family Medicine | Admitting: Family Medicine

## 2021-03-18 ENCOUNTER — Other Ambulatory Visit: Payer: Self-pay

## 2021-03-18 DIAGNOSIS — Z1231 Encounter for screening mammogram for malignant neoplasm of breast: Secondary | ICD-10-CM

## 2021-03-18 DIAGNOSIS — S82892B Other fracture of left lower leg, initial encounter for open fracture type I or II: Secondary | ICD-10-CM | POA: Diagnosis not present

## 2021-04-08 ENCOUNTER — Other Ambulatory Visit: Payer: Self-pay | Admitting: Family Medicine

## 2021-04-08 DIAGNOSIS — I10 Essential (primary) hypertension: Secondary | ICD-10-CM

## 2021-04-17 DIAGNOSIS — S82892B Other fracture of left lower leg, initial encounter for open fracture type I or II: Secondary | ICD-10-CM | POA: Diagnosis not present

## 2021-05-08 DIAGNOSIS — F250 Schizoaffective disorder, bipolar type: Secondary | ICD-10-CM | POA: Diagnosis not present

## 2021-05-08 DIAGNOSIS — R69 Illness, unspecified: Secondary | ICD-10-CM | POA: Diagnosis not present

## 2021-05-13 ENCOUNTER — Encounter: Payer: Medicare HMO | Admitting: Psychology

## 2021-05-20 ENCOUNTER — Other Ambulatory Visit: Payer: Self-pay | Admitting: Family Medicine

## 2021-05-20 DIAGNOSIS — I10 Essential (primary) hypertension: Secondary | ICD-10-CM

## 2021-06-04 ENCOUNTER — Encounter: Payer: Medicare HMO | Admitting: Psychology

## 2021-07-23 ENCOUNTER — Telehealth: Payer: Self-pay | Admitting: Internal Medicine

## 2021-07-24 ENCOUNTER — Telehealth: Payer: Self-pay | Admitting: Internal Medicine

## 2021-07-24 NOTE — Telephone Encounter (Signed)
Fax received from Dr. Edmonia Lynch with [facility] to perform a Right total knee replacement on patient.  Patient needs surgery clearance. Patient was seen on 10/21/20 and was scheduled for OV on 08/26/2021 Office protocol is a risk assessment can be sent to surgeon if patient has been seen in 60 days or less.   Sending to Dr. Annamaria Boots for risk assessment

## 2021-07-25 DIAGNOSIS — F250 Schizoaffective disorder, bipolar type: Secondary | ICD-10-CM | POA: Diagnosis not present

## 2021-07-25 DIAGNOSIS — R69 Illness, unspecified: Secondary | ICD-10-CM | POA: Diagnosis not present

## 2021-07-25 DIAGNOSIS — F315 Bipolar disorder, current episode depressed, severe, with psychotic features: Secondary | ICD-10-CM | POA: Diagnosis not present

## 2021-07-25 NOTE — Telephone Encounter (Signed)
Ok to bring her in with me or APP for surgical clearance. Won't need to see me in Feb if seen in office sooner- could reschedule that for 1 year.

## 2021-07-25 NOTE — Telephone Encounter (Signed)
Called and spoke with patient to get her scheduled for OV for surgical clearance. Patient has been scheduled with Dr. Annamaria Boots. Nothing further needed at this time.  Next Appt With Pulmonology Baird Lyons, MD) 08/26/2021 at 4:00 PM

## 2021-08-04 ENCOUNTER — Ambulatory Visit (INDEPENDENT_AMBULATORY_CARE_PROVIDER_SITE_OTHER): Payer: Medicare HMO | Admitting: Family Medicine

## 2021-08-04 ENCOUNTER — Other Ambulatory Visit: Payer: Self-pay | Admitting: Family Medicine

## 2021-08-04 ENCOUNTER — Encounter: Payer: Self-pay | Admitting: Family Medicine

## 2021-08-04 VITALS — BP 138/80 | HR 106 | Temp 96.9°F | Ht 69.0 in | Wt 259.5 lb

## 2021-08-04 DIAGNOSIS — H6981 Other specified disorders of Eustachian tube, right ear: Secondary | ICD-10-CM | POA: Diagnosis not present

## 2021-08-04 DIAGNOSIS — E782 Mixed hyperlipidemia: Secondary | ICD-10-CM

## 2021-08-04 DIAGNOSIS — I10 Essential (primary) hypertension: Secondary | ICD-10-CM

## 2021-08-04 DIAGNOSIS — Z0001 Encounter for general adult medical examination with abnormal findings: Secondary | ICD-10-CM

## 2021-08-04 DIAGNOSIS — M25512 Pain in left shoulder: Secondary | ICD-10-CM | POA: Diagnosis not present

## 2021-08-04 DIAGNOSIS — R7303 Prediabetes: Secondary | ICD-10-CM | POA: Diagnosis not present

## 2021-08-04 DIAGNOSIS — Z0000 Encounter for general adult medical examination without abnormal findings: Secondary | ICD-10-CM

## 2021-08-04 DIAGNOSIS — G3184 Mild cognitive impairment, so stated: Secondary | ICD-10-CM | POA: Diagnosis not present

## 2021-08-04 DIAGNOSIS — M25532 Pain in left wrist: Secondary | ICD-10-CM

## 2021-08-04 DIAGNOSIS — E876 Hypokalemia: Secondary | ICD-10-CM

## 2021-08-04 DIAGNOSIS — E559 Vitamin D deficiency, unspecified: Secondary | ICD-10-CM

## 2021-08-04 DIAGNOSIS — Z820 Family history of epilepsy and other diseases of the nervous system: Secondary | ICD-10-CM

## 2021-08-04 DIAGNOSIS — H6991 Unspecified Eustachian tube disorder, right ear: Secondary | ICD-10-CM

## 2021-08-04 LAB — CBC WITH DIFFERENTIAL/PLATELET
Basophils Absolute: 0 10*3/uL (ref 0.0–0.1)
Basophils Relative: 0.8 % (ref 0.0–3.0)
Eosinophils Absolute: 0.2 10*3/uL (ref 0.0–0.7)
Eosinophils Relative: 3.4 % (ref 0.0–5.0)
HCT: 40.7 % (ref 36.0–46.0)
Hemoglobin: 13.2 g/dL (ref 12.0–15.0)
Lymphocytes Relative: 37.7 % (ref 12.0–46.0)
Lymphs Abs: 2.3 10*3/uL (ref 0.7–4.0)
MCHC: 32.5 g/dL (ref 30.0–36.0)
MCV: 79.4 fl (ref 78.0–100.0)
Monocytes Absolute: 0.4 10*3/uL (ref 0.1–1.0)
Monocytes Relative: 6.4 % (ref 3.0–12.0)
Neutro Abs: 3.1 10*3/uL (ref 1.4–7.7)
Neutrophils Relative %: 51.7 % (ref 43.0–77.0)
Platelets: 330 10*3/uL (ref 150.0–400.0)
RBC: 5.12 Mil/uL — ABNORMAL HIGH (ref 3.87–5.11)
RDW: 16.9 % — ABNORMAL HIGH (ref 11.5–15.5)
WBC: 6.1 10*3/uL (ref 4.0–10.5)

## 2021-08-04 LAB — COMPREHENSIVE METABOLIC PANEL
ALT: 17 U/L (ref 0–35)
AST: 21 U/L (ref 0–37)
Albumin: 4.2 g/dL (ref 3.5–5.2)
Alkaline Phosphatase: 66 U/L (ref 39–117)
BUN: 13 mg/dL (ref 6–23)
CO2: 26 mEq/L (ref 19–32)
Calcium: 9.7 mg/dL (ref 8.4–10.5)
Chloride: 99 mEq/L (ref 96–112)
Creatinine, Ser: 1.1 mg/dL (ref 0.40–1.20)
GFR: 55.03 mL/min — ABNORMAL LOW (ref >60.00)
Glucose, Bld: 112 mg/dL — ABNORMAL HIGH (ref 70–99)
Potassium: 3.4 mEq/L — ABNORMAL LOW (ref 3.5–5.1)
Sodium: 138 mEq/L (ref 135–145)
Total Bilirubin: 0.4 mg/dL (ref 0.2–1.2)
Total Protein: 8.3 g/dL (ref 6.0–8.3)

## 2021-08-04 LAB — LIPID PANEL
Cholesterol: 242 mg/dL — ABNORMAL HIGH (ref 0–200)
HDL: 67.9 mg/dL (ref >39.00)
LDL Cholesterol: 153 mg/dL — ABNORMAL HIGH (ref 0–99)
NonHDL: 173.71
Total CHOL/HDL Ratio: 4
Triglycerides: 102 mg/dL (ref 0.0–149.0)
VLDL: 20.4 mg/dL (ref 0.0–40.0)

## 2021-08-04 LAB — VITAMIN D 25 HYDROXY (VIT D DEFICIENCY, FRACTURES): VITD: 22.71 ng/mL — ABNORMAL LOW (ref 30.00–100.00)

## 2021-08-04 LAB — T4, FREE: Free T4: 0.84 ng/dL (ref 0.60–1.60)

## 2021-08-04 LAB — TSH: TSH: 0.54 u[IU]/mL (ref 0.35–5.50)

## 2021-08-04 LAB — HEMOGLOBIN A1C: Hgb A1c MFr Bld: 6 % (ref 4.6–6.5)

## 2021-08-04 LAB — VITAMIN B12: Vitamin B-12: 640 pg/mL (ref 211–911)

## 2021-08-04 MED ORDER — VITAMIN D (ERGOCALCIFEROL) 1.25 MG (50000 UNIT) PO CAPS: 50000.0000 [IU] | ORAL_CAPSULE | ORAL | 0 refills | Status: DC

## 2021-08-04 MED ORDER — POTASSIUM CHLORIDE CRYS ER 20 MEQ PO TBCR: 20.0000 meq | EXTENDED_RELEASE_TABLET | Freq: Every day | ORAL | 0 refills | Status: DC

## 2021-08-04 MED ORDER — METOPROLOL SUCCINATE ER 50 MG PO TB24: ORAL_TABLET | ORAL | 1 refills | Status: DC

## 2021-08-04 NOTE — Patient Instructions (Addendum)
You can schedule your appointment with OB/Gyn for your yearly exam and pap.  You can also follow up with Ortho regarding your left wrist pain and swelling.  An ultrasound would help to see if there is a lipoma (an overgrowth of a fat cell) there.  You can try using Flonase or saline nasal spray for the discomfort in your right ear.  You can also try using an over-the-counter antihistamine such as Allegra or Zyrtec to help with the symptoms.

## 2021-08-04 NOTE — Progress Notes (Signed)
Subjective:     [name] is a 61 y.o. female and is here for a comprehensive physical exam. The patient reports problems including joint pain and memory issues.  Pt followed by Ortho advised has a tear in the right shoulder.  Received an injection.  Patient also notes left wrist edema, pain, numbness and tingling that has increased in severity since a fall 1 year ago.  Pain is described as sharp and constant.  Patient endorses weakness in left hand.  Tried wrist splint to cause increased edema.  Also endorses ankle pain s/p L fibular fracture from that same fall.  Taking Tylenol and ibuprofen, 2 tablets every 4-6 hours regularly.  Tried water aerobics, ice, Voltaren gel.  Patient notes a family history of rheumatoid arthritis in her sister.  Patient states in the past she was told she did not have RA.  Endorses personal h/o arthritis in spine.  Hoping to get surgery on left shoulder.  Patient notes elevated BP 2/2 being out of metoprolol a week or more.  States BP was controlled 120-130/60 while on medication.  Was as high as 160/100 a few weeks ago.  States is decreasing sodium intake.  Denies recent Pap.  Needs to schedule appointment with OB/GYN.  Seeing neurology for ongoing memory issues.  States has upcoming appointment for testing.  Patient now relies on others to help her with ADLs as she is experiencing short-term memory deficit.  Patient notes her mother, father, and eldest sister with history of Alzheimer's dz.  Patient endorses R ear pain x2-3 days.  Denies current rhinorrhea, fever, chills, nasal congestion, cough, sore throat.  Endorses recovering from viral illness 1-2 weeks ago.  Social History   Socioeconomic History   Marital status: Divorced    Spouse name: Not on file   Number of children: 3   Years of education: 14   Highest education level: Some college, no degree  Occupational History   Occupation: Disabled  Tobacco Use   Smoking status: Former    Packs/day: 1.00     Years: 5.00    Pack years: 5.00    Types: Cigarettes    Quit date: 07/07/2007    Years since quitting: 14.0   Smokeless tobacco: Never  Vaping Use   Vaping Use: Never used  Substance and Sexual Activity   Alcohol use: No   Drug use: No   Sexual activity: Not Currently    Birth control/protection: Surgical    Comment: X 1 year  Other Topics Concern   Not on file  Social History Narrative   Single, 3 children   Right handed   Associates degree   occ 1 cup daily      09/01/2018: Lives with sister, [name], who helps manage medications, in 2 story house, and a pit bull   Has one daughter in [place], 2 sons in [place]; 7 grandchildren   Currently not exercising, but wants to return to water aerobics at [employer] [facility]   Financial Resource Strain: Medium Risk   Difficulty of Paying Living Expenses: Somewhat hard  Food Insecurity: No Food Insecurity   Worried About Charity fundraiser in the Last Year: Never true   Arboriculturist in the Last Year: Never true  Transportation Needs: No Transportation Needs   Lack of Transportation (Medical): No   Lack of Transportation (Non-Medical): No  Physical Activity: Insufficiently Active   Days of Exercise per Week: 4 days   Minutes  of Exercise per Session: 20 min  Stress: No Stress Concern Present   Feeling of Stress : Only a little  Social Connections: Moderately Isolated   Frequency of Communication with Friends and Family: Three times a week   Frequency of Social Gatherings with Friends and Family: Three times a week   Attends Religious Services: More than 4 times per year   Active Member of Clubs or Organizations: No   Attends Archivist Meetings: Never   Marital Status: Divorced  Human resources officer Violence: Not At Risk   Fear of Current or Ex-Partner: No   Emotionally Abused: No   Physically Abused: No   Sexually Abused: No   Health Maintenance  Topic Date Due   COVID-19 Vaccine (3 - Booster for [place]  series) 08/20/2021 (Originally 12/16/2019)   TETANUS/TDAP  09/12/2021 (Originally 02/25/1980)   INFLUENZA VACCINE  10/03/2021 (Originally 02/03/2021)   Zoster Vaccines- Shingrix (1 of 2) 07/10/2022 (Originally 02/26/2012)   MAMMOGRAM  03/18/2022   COLONOSCOPY (Pts 45-75yrs Insurance coverage will need to be confirmed)  06/13/2023   Hepatitis C Screening  Completed   HIV Screening  Completed   HPV VACCINES  Aged Out   PAP SMEAR-Modifier  Discontinued    The following portions of the patient's history were reviewed and updated as appropriate: allergies, current medications, past family history, past medical history, past social history, past surgical history, and problem list.  Review of Systems Pertinent items noted in HPI and remainder of comprehensive ROS otherwise negative.   Objective:    BP 138/80    Pulse (!) 106    Temp (!) 96.9 F (36.1 C) (Temporal)    Ht 5\' 9"  (1.753 m)    Wt 259 lb 8 oz (117.7 kg)    SpO2 97%    BMI 38.32 kg/m  General appearance: alert, cooperative, no distress, and slowed mentation Head: Normocephalic, without obvious abnormality, atraumatic Eyes: conjunctivae/corneas clear. PERRL, EOM's intact. Fundi benign. Ears: normal TM's and external ear canals both ears Nose: Nares normal. Septum midline. Mucosa normal. No drainage or sinus tenderness. Throat: lips, mucosa, and tongue normal; teeth and gums normal mild right postauricular TTP into right lateral neck.. Neck: no adenopathy, no carotid bruit, no JVD, supple, symmetrical, trachea midline, and thyroid not enlarged, symmetric, no tenderness/mass/nodules Lungs: clear to auscultation bilaterally Heart: regular rate and rhythm, S1, S2 normal, no murmur, click, rub or gallop Abdomen: soft, non-tender; bowel sounds normal; no masses,  no organomegaly Extremities: extremities normal, atraumatic, no cyanosis or edema.  Left wrist>R wrist.  Large visible area of fullness/edema, soft mobile mass on dorsum of left  wrist.  Negative Tinel and Phalen's. No deformities of fingers.  Grip in bilateral hands 4/5. Pulses: 2+ and symmetric Skin: Skin color, texture, turgor normal. No rashes or lesions Lymph nodes: Cervical, supraclavicular, and axillary nodes normal. Neurologic: Alert and oriented X 3, normal strength and tone. Normal symmetric reflexes. Normal coordination and gait Mental status: slowed cognition, difficulty with word finding, short term memory deficit.  Starts a response, then forgets the initial question that was asked.     Assessment:    Healthy female exam with multiple joint pain, left wrist edema.   Plan:    Anticipatory guidance given including wearing seatbelts, smoke detectors in the home, increasing physical activity, increasing p.o. intake of water and vegetables. -labs -pt to schedule pap with OB/Gyn -mammogram up to date, done 03/18/21 -Colonoscopy done 06/12/2013. -Immunizations reviewed -Given handout -Next CPE in 1 year See  After Visit Summary for Counseling Recommendations   Pain in joint of left shoulder -MRI left shoulder without contrast 09/29/2020: Partial-thickness articular surface tear of distal anterior supraspinatus tendon with moderate supraspinatus tendinopathy.  Mild infraspinatus and subscapularis tendinopathy.  Moderate degenerative glenohumeral arthropathy with moderate humeral head spurring and a central non-fragment osteochondral lesion along the glenoid.  Degenerative subcortical cystic lesion along the upper lateral margin of the humeral head.  Trace subacromial subdeltoid bursitis. -continue supportive care.  Discussed limiting NSAID and Tylenol use.  Avoid taking NSAIDs without food. -Continue follow-up with Ortho -Surgery pending - Plan: CBC with Differential/Platelet, Rheumatoid Factor, Lupus (SLE) Analysis  Left wrist pain  -Fullness in left wrist -Discussed possible causes including lipoma, carpal tunnel, arthritis, autoimmune disorder -Discussed  follow-up with Ortho for further evaluation and ultrasound -Supportive care including topical analgesics, NSAIDs, stretching exercises, heat, wrist splint - Plan: CBC with Differential/Platelet, Rheumatoid Factor, Lupus (SLE) Analysis  Mild neurocognitive disorder, unclear etiology  -short term memory deficit noticed on exam -family members helping with finances and other ADLs -continue f/u with Neurology for continued evaluation - Plan: CBC with Differential/Platelet, TSH, T4, Free, CMP, Vitamin D, 25-hydroxy, Vitamin B12  Essential hypertension  -elevated -Rx for Toprol XL 50 mg daily refilled -Lifestyle modifications encouraged -Continue checking blood pressure at home and keeping a log to bring with you to clinic - Plan: CMP, Toprol XL  Mixed hyperlipidemia -Total cholesterol 262, LDL 170 on 04/24/2020 -Lifestyle modification strongly encouraged - Plan: Lipid panel  Prediabetes -Hemoglobin A1c 5.9% on 04/24/2020 -Lifestyle modifications - Plan: Hemoglobin A1c  Morbid obesity ([place])  -Continue lifestyle modifications including water aerobics - Plan: TSH, T4, Free, Hemoglobin A1c, Vitamin D, 25-hydroxy  Family history of Alzheimer's disease -Continue follow-up with neurology for evaluation  Eustachian tube dysfunction, right -OTC Flonase or p.o. antihistamine such as Allegra  F/u in 1 month for HTN  Grier Mitts, MD

## 2021-08-05 ENCOUNTER — Ambulatory Visit (INDEPENDENT_AMBULATORY_CARE_PROVIDER_SITE_OTHER): Payer: Medicare HMO | Admitting: Psychology

## 2021-08-05 ENCOUNTER — Ambulatory Visit: Payer: Medicare HMO

## 2021-08-05 ENCOUNTER — Encounter: Payer: Self-pay | Admitting: Psychology

## 2021-08-05 ENCOUNTER — Other Ambulatory Visit: Payer: Self-pay

## 2021-08-05 DIAGNOSIS — M797 Fibromyalgia: Secondary | ICD-10-CM | POA: Diagnosis not present

## 2021-08-05 DIAGNOSIS — F332 Major depressive disorder, recurrent severe without psychotic features: Secondary | ICD-10-CM | POA: Diagnosis not present

## 2021-08-05 DIAGNOSIS — R4189 Other symptoms and signs involving cognitive functions and awareness: Secondary | ICD-10-CM

## 2021-08-05 DIAGNOSIS — F0280 Dementia in other diseases classified elsewhere without behavioral disturbance: Secondary | ICD-10-CM | POA: Insufficient documentation

## 2021-08-05 DIAGNOSIS — F411 Generalized anxiety disorder: Secondary | ICD-10-CM

## 2021-08-05 HISTORY — DX: Dementia in other diseases classified elsewhere, unspecified severity, without behavioral disturbance, psychotic disturbance, mood disturbance, and anxiety: F02.80

## 2021-08-05 LAB — RHEUMATOID FACTOR: Rheumatoid fact SerPl-aCnc: <14 IU/mL (ref <14)

## 2021-08-05 NOTE — Progress Notes (Signed)
° °  Psychometrician Note   Cognitive testing was administered to [name] by Cruzita Lederer, B.S. (psychometrist) under the supervision of Dr. Christia Reading, Ph.D., licensed psychologist on 08/05/2021. [name] did not appear overtly distressed by the testing session per behavioral observation or responses across self-report questionnaires. Rest breaks were offered.    The battery of tests administered was selected by Dr. Christia Reading, Ph.D. with consideration to [name]'s current level of functioning, the nature of her symptoms, emotional and behavioral responses during interview, level of literacy, observed level of motivation/effort, and the nature of the referral question. This battery was communicated to the psychometrist. Communication between Dr. Christia Reading, Ph.D. and the psychometrist was ongoing throughout the evaluation and Dr. Christia Reading, Ph.D. was immediately accessible at all times. Dr. Christia Reading, Ph.D. provided supervision to the psychometrist on the date of this service to the extent necessary to assure the quality of all services provided.    [name] will return within approximately 1-2 weeks for an interactive feedback session with Dr. Melvyn Novas at which time her test performances, clinical impressions, and treatment recommendations will be reviewed in detail. [name] understands she can contact our office should she require our assistance before this time.  A total of 160 minutes of billable time were spent face-to-face with [name] by the psychometrist. This includes both test administration and scoring time. Billing for these services is reflected in the clinical report generated by Dr. Christia Reading, Ph.D.  This note reflects time spent with the psychometrician and does not include test scores or any clinical interpretations made by Dr. Melvyn Novas. The full report will follow in a separate note.

## 2021-08-05 NOTE — Progress Notes (Addendum)
NEUROPSYCHOLOGICAL EVALUATION [facility]  Date of Evaluation: August 05, 2021  Reason for Referral:   [name] is a 61 y.o. right-handed African-American female referred by Metta Clines, D.O., to characterize her current cognitive functioning and assist with diagnostic clarity and treatment planning in the context of mild cognitive impairment, concerns for progressive cognitive deterioration, and a family history of early-onset neurodegenerative illness.   Assessment and Plan:   Clinical Impression(s): Scores across stand-alone and embedded performance validity measures were variable. While this suggests it would be prudent to interpret obtained test scores with some caution, I do believe that there is a significant degree of ongoing cognitive impairment. As she already receives disability benefits, I also am unaware of any other secondary gain which [name] could hope to obtain by purposefully performing poorly or not fully engaging. As such, the results of the current evaluation are believed to be a generally adequate representation of [name]'s current cognitive functioning.  [name] pattern of performance is suggestive of diffuse and quite severe cognitive impairment, with the vast majority of performances scoring in the exceptionally low to well below average normative ranges relative to age-matched peers. A relative strength was seen across basic attention and some variability seen across more complex attention/concentration. However, performances across all other assessed cognitive domains were impaired. This included processing speed, executive functioning, receptive and expressive language, visuospatial abilities, and all aspects of learning and memory.   Relative to her previous evaluation, some decline across reasoning abilities and verbal memory was exhibited. The remaining domains exhibited stability  (however, this represents stable severe impairment in a majority of these cases). Based upon her report and record review, there has been some functional decline in that she no longer is able to manage her medications and has fully stopped driving. Given ongoing cognitive and functional decline, I do feel that she meets criteria for a Major Neurocognitive Disorder ("dementia") at the present time.  The etiology of ongoing impairment is unclear, very difficult to determine given diffuse impairment and the lack of strong patterns of strengths and weakness, and likely multifactorial in nature. I have concerns surrounding the presence of Lewy body dementia. Behaviorally, she exhibited tremors in her upper extremities during testing, reported ongoing REM sleep behaviors which have been present and increasing in frequency for the past 2.5 years, and reported newly emerging fully formed hallucinations in the time since her previous evaluation. From a cognitive perspective, [name] greatest areas of impairment surround visuospatial abilities and executive functioning. All of these factors represent classic symptoms of Lewy body dementia and certainly increase concern. In addition to this, prominent visuospatial impairment and amnestic memory would also raise concerns for a more rare posterior cortical atrophy variant of Alzheimer's disease, especially given her age and family history. It is worth stating that Lewy body dementia and Alzheimer's disease co-occur fairly often and it may be that a combination of these illnesses best represents the cause for ongoing decline.   11/24/21 ADDENDUM: DaTscan on 10/22/2021 revealed asymmetric decreased radiotracer activity in the right striatum, which can be found within Lewy body dementia presentations, as well as other parkinsonian syndromes. 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 Creutzfeldt-Jakob disease (CJD) is an extremely rare illness and represents the most common prion disease. Much of [name]'s symptoms do align with this sort of illness (  i.e., age of onset, seemingly rapid progressing cognitive decline, vision decline, jerking behaviors, and visual hallucinations). Generally speaking, CJD is fatal in 12-15 months with the more slow moving variant and fatal in 6-9 months with a typical presentation. There are extremely rare prior diseases (Gerstmann-Straussler-Scheinker [GSS] Syndrome and variably protease-sensitive prionopathy [VPSPr]) which do have typical life expectancies of 2-5 years. Given lab findings, these etiologies must also remain under consideration.   Ongoing psychiatric distress certainly can create and maintain cognitive difficulties. She reported acute symptoms of severe depression and moderate anxiety across mood-related questionnaires. A history of bipolar disorder is often associated with executive dysfunction and, in some cases, visuospatial deficits across cognitive testing. While this could certainly account for some of her ongoing impairment, it is my opinion that these factors, as well as ongoing chronic pain, sleep dysfunction, and other medical ailments, serve to worsen the presentation of an underlying neurological disease process. Continued medical monitoring will be important moving forward.  Recommendations: [name] could discuss cognitive-based medication options with Dr. Tomi Likens. It is important to highlight that these medications have been shown to slow functional decline in some individuals. There is no current treatment which can stop or reverse cognitive decline when caused by a neurodegenerative illness.   If further diagnostic clarity is desired, she could discuss additional options with Dr. Tomi Likens. These would likely include additional imaging (FDG-PET or a DaTscan) or a lumbar puncture.    [name] is encouraged to speak with her prescribing physician regarding medication adjustments to optimally manage these symptoms as current medications do not seem sufficient based upon her symptom reporting.  Performance across neurocognitive testing is not a strong predictor of an individual's safety operating a motor vehicle. However, based upon current performances, I would strongly advise against operating a motor vehicle. Should her family wish to pursue a formalized driving evaluation, they could reach out to the following agencies: [employer] in [place]: [phone] [employer] [facility]: [facility]: [place]: [phone] or [phone]  She will likely benefit from the establishment and maintenance of a routine in order to maximize functional abilities over time.  It will be important for Ms. Hebard to have another person with her when in situations where she may need to process information, weigh the pros and cons of different options, and make decisions, in order to ensure that she fully understands and recalls all information to be considered.  If not already done, Ms. Ellingwood and her family may want to discuss her wishes regarding durable power of attorney and medical decision making, so that she can have input into these choices. Additionally, they may wish to discuss future plans for caretaking and seek out community options for in home/residential care should they become necessary.  Ms. Blakesley is encouraged to attend to lifestyle factors for brain health (e.g., regular physical exercise, good nutrition habits, regular participation in cognitively-stimulating activities, and general stress management techniques), which are likely to have benefits for both emotional adjustment and cognition. In fact, in addition to promoting good general health, regular exercise incorporating aerobic activities (e.g., brisk  walking, jogging, cycling, etc.) has been demonstrated to be a very effective treatment for depression and stress, with similar efficacy rates to both antidepressant medication and psychotherapy. Optimal control of vascular risk factors (including safe cardiovascular exercise and adherence to dietary recommendations) is encouraged. Continued participation in activities which provide mental stimulation and social interaction is also recommended.   Important information should be provided to  Ms. Bachicha in written format in all instances. This information should be placed in a highly frequented and easily visible location within her home to promote recall. External strategies such as written notes in a consistently used memory journal, visual and nonverbal auditory cues such as a calendar on the refrigerator or appointments with alarm, such as on a cell phone, can also help maximize recall.  To address problems with processing speed, she may wish to consider:   -Ensuring that she is alerted when essential material or instructions are being presented   -Adjusting the speed at which new information is presented   -Allowing for more time in comprehending, processing, and responding in conversation  To address problems with fluctuating attention, she may wish to consider:   -Avoiding external distractions when needing to concentrate   -Limiting exposure to fast paced environments with multiple sensory demands   -Writing down complicated information and using checklists   -Attempting and completing one task at a time (i.e., no multi-tasking)   -Verbalizing aloud each step of a task to maintain focus   -Reducing the amount of information considered at one time  Review of Records:   Ms. Birman completed a comprehensive neuropsychological evaluation Howard Pouch, Ph.D.) on 11/23/2018. Notably, this evaluation was conducted virtually rather than in a traditional face-to-face format due to the ongoing  COVID-19 pandemic. Concerns about test validity were reported. For example, the sound quality over the telephone and occasional auditory lag made it difficult to understand some of the Ms. Shepperson's answers. The examiner also heard echoes of what was being said, potentially due to technical difficulties or there being another person in the room assisting Ms. Joya Gaskins with testing procedures. Test results were said to require caution when interpreting. If taken at face value, weaknesses were said to surround executive functioning, with performance variability exhibited across auditory comprehension. Overall, the etiology of cognitive weaknesses was said to be unclear and potentially related to her medical and psychiatric comorbidities. Memory testing did not suggest Alzheimer's disease and there was no reported evidence of cognitive inefficiency which is often seen in MS.    Ms. Almon was seen by [facility] Metta Clines, D.O.) on 05/02/2019 for follow-up of several conditions. Briefly, she has had a history of seizures since childhood. She previously took phenobarbital until age 5. She was seizure-free for many years until December 2014, where she had two suspected seizures followed by a third seizure in February 2015. She was noted by her sister to be unresponsive, shaking with eyes rolled back and tongue biting, lasting approximately 3 minutes. No bowel or bladder incontinence was noted. Afterwards, she was confused and agitated. She was started on Keppra 51m twice daily. No recurrent seizures since 2015 were reported. Keppra was subsequently discontinued after several years. [place], un[place]y gait, and difficulty with fine motor skills. She took Betaseron until 2005. Brain MRI with and without contrast from 11/03/2004 reportedly demonstrated "no lesion identified in the brain which would be characteristic for multiple  sclerosis." Brain MRI with and without contrast from 01/29/2011 demonstrated mild non-enhancing nonspecific punctate T2 hyperintensities in the subcortical white matter, mildly increased on repeat study on 10/18/2013. She was admitted to [facility] 09/11/2016 for bilateral lower extremity weakness with LLQ abdominal pain and nausea. MRI of the cervical, thoracic and lumbar spine demonstrated no demyelinating spinal cord disease with mild disc bulging but no significant spinal stenosis. HIV testing  was negative.   Regarding cognitive dysfunction, she started having short-term memory problems in December 2019 (this is when the family noticed difficulties). She reported losing her train of thought mid-sentence or will forget why she walked into a room. She is also told by others that she asks repetitive questions. Her sister took away her car keys because Ms. Koogler got confused driving on a familiar route. She reported trouble paying her bills and requires help from her sister. She also reported not being able to remember her sister's wedding during Spring 2019.  She completed a comprehensive in-person evaluation with myself on 09/07/2019. There were again some concerns surrounding performance validity testing. If taken at face value, her pattern of performance was suggestive of diffuse cognitive impairment, often in the well below average and exceptionally low normative ranges. Relative strengths were exhibited across basic attention and verbal abstract reasoning. However, all other scores were uniformly below expectation. Concerns were expressed surrounding an early-onset neurodegenerative condition; however, no discernable patterns could be identified. Responses across mood-related questionnaires suggested severe anxiety and moderate depression over the past 1-2 weeks, as well as mild sleep dysfunction. She was ultimately diagnosed with a mild neurocognitive disorder ("mild cognitive impairment"). Repeat  testing in 12-18 months was recommended.  She most recently met with Dr. Tomi Likens on 02/04/2021 for follow-up. She completed a formal eye examination in March 2022 which revealed worsening nuclear sclerosis and a cataract in the left eye. She underwent cataract surgery was reported doing well. Her seizure disorder was said to be stable and she has been taken off AED medications. Ultimately, Ms. Bortner was referred for a repeat neuropsychological evaluation to characterize her cognitive abilities and to assist with diagnostic clarity and treatment planning.   Past Medical History:  Diagnosis Date   Abdominal pain 09/27/2009   Allergic rhinitis 09/20/2008   Allergy skin test 10/10/08 Allergy Profile 01/18/2013-total IgE 242.4 with broad elevations for common inhalant allergens.    Arthritis    oa   Asthma, moderate persistent 06/04/2009   Allergy Profile 01/18/2013-total IgE 242.4 with broad elevations for common inhalant allergens. PFT 07/05/08- FVC 3.51/ 88%, FEV1 2.38/ 78%, R 0.68, 25-75% 1.45/ 44%, TLC 83%, DLCO 68% PFT 10/21/20- Moderate obstructive airways disease with slight respnse to bronchodilator. Normal Diffusion. FEV!/FVC 0.75    Backache, unspecified    Bipolar I disorder 06/13/2008   Chronic back pain 06/13/2008   Chronic respiratory failure with hypoxia 05/06/2020   Stable with routine use of O2 2L during sleep. Nocturnal hypoxemia with normal daytime oxygenation on room air.    Class 3 severe obesity due to excess calories with serious comorbidity and body mass index (BMI) of 40.0 to 44.9 in adult 12/19/2018   Closed left ankle fracture    DDD (degenerative disc disease), lumbosacral 10/03/2013   Disorder of sacrum 11/03/2013   Dysphagia, pharyngoesophageal phase 09/12/2008   Elevated LFTs 10/15/2010   Female pelvic peritoneal adhesions 12/19/2018   Fibromyalgia    Galactorrhea not associated with childbirth 12/19/2018   Generalized anxiety disorder 04/05/2012   GERD  (gastroesophageal reflux disease) 07/05/2008   Heart disease 12/19/2018   Hiatal hernia 11/03/2013   Hyperglycemia 06/13/2008   Hypertension    Intractable migraine with aura 11/03/2013   Lumbar radiculopathy 08/30/2012   Lumbosacral spondylosis 11/03/2013   Major depressive disorder 09/08/2019   Mild neurocognitive disorder, unclear etiology 09/08/2019   Mixed hyperlipidemia 10/04/2019   On home oxygen therapy    nightly at 2l/m prn   Open left  ankle fracture 07/16/2020   Oropharyngeal dysphagia 03/01/2018   OSA (obstructive sleep apnea) 06/13/2008   Pain in pelvis 12/19/2018   PONV (postoperative nausea and vomiting)    Schizoaffective disorder, bipolar type 10/04/2019   Seizure disorder    last seizure 2014   Status post open reduction with internal fixation (ORIF) of fracture of ankle 08/26/2020   L ankle   Thyroid disease 10/03/2013   Vitamin D deficiency 10/04/2019   Vocal cord disorder 04/02/2009   Weakness 09/11/2016    Past Surgical History:  Procedure Laterality Date   APPENDECTOMY  1988   laparoscopic   BREAST LUMPECTOMY WITH NEEDLE LOCALIZATION Left 06/06/2015   Procedure: LEFT BREAST LUMPECTOMY WITH NEEDLE LOCALIZATION TIMES TWO;  Surgeon: Erroll Luna, MD;  Location: [place];  Service: General;  Laterality: Left;   [place]   twins   CHOLECYSTECTOMY  yrs ago   COLONOSCOPY N/A 06/12/2013   Procedure: COLONOSCOPY;  Surgeon: Irene Shipper, MD;  Location: [facility];  Service: Endoscopy;  Laterality: N/A;   HERNIA REPAIR  yrs ago   umbilical   ORIF ANKLE FRACTURE Left 07/16/2020   Procedure: OPEN REDUCTION INTERNAL FIXATION (ORIF) ANKLE FRACTURE;  Surgeon: Renette Butters, MD;  Location: [place];  Service: Orthopedics;  Laterality: Left;   TUBAL LIGATION  1988   VAGINAL HYSTERECTOMY  2005   complete    Current Outpatient Medications:    acetaminophen (TYLENOL) 500 MG tablet, Take 1,000 mg by mouth every 6  (six) hours as needed for moderate pain., Disp: , Rfl:    albuterol (PROVENTIL) (2.5 MG/3ML) 0.083% nebulizer solution, Take 3 mLs by nebulization every 4 (four) hours as needed for wheezing or shortness of breath., Disp: 75 mL, Rfl: 12   aspirin EC 81 MG EC tablet, Take 1 tablet (81 mg total) by mouth 2 (two) times daily. Swallow whole., Disp: 30 tablet, Rfl: 11   benztropine (COGENTIN) 0.5 MG tablet, Take 0.5 mg by mouth daily. , Disp: , Rfl:    EPINEPHrine 0.3 mg/0.3 mL IJ SOAJ injection, Inject 0.3 mg into the muscle daily as needed for anaphylaxis., Disp: , Rfl:    famotidine (PEPCID) 20 MG tablet, Take 1 tablet (20 mg total) by mouth 2 (two) times daily. For stomach acid. (Patient taking differently: Take 20 mg by mouth daily.), Disp: 60 tablet, Rfl: 0   FLUoxetine (PROZAC) 40 MG capsule, Take 80 mg by mouth daily., Disp: , Rfl:    Fluticasone-Salmeterol (ADVAIR) 500-50 MCG/DOSE AEPB, Inhale 1 puff into the lungs 2 (two) times daily., Disp: , Rfl:    Fluticasone-Umeclidin-Vilant (TRELEGY ELLIPTA) 100-62.5-25 MCG/INH AEPB, Inhale 1 puff into the lungs daily., Disp: 28 each, Rfl: 0   HYDROcodone-acetaminophen (NORCO/VICODIN) 5-325 MG tablet, Take 1-2 tablets by mouth every 4 (four) hours as needed for moderate pain (pain score 4-6)., Disp: 30 tablet, Rfl: 0   hydrOXYzine (ATARAX/VISTARIL) 25 MG tablet, Take 25 mg by mouth 4 (four) times daily as needed for anxiety or itching., Disp: , Rfl: 2   lamoTRIgine (LAMICTAL) 200 MG tablet, Take 200 mg by mouth at bedtime., Disp: , Rfl:    metoprolol succinate (TOPROL-XL) 50 MG 24 hr tablet, Take with or immediately following a meal., Disp: 90 tablet, Rfl: 1   mirtazapine (REMERON) 15 MG tablet, Take 15 mg by mouth at bedtime., Disp: , Rfl:    Multiple Vitamin (MULTIVITAMIN WITH MINERALS) TABS, Take 1 tablet by mouth daily. For nutritional supplementation. (Patient taking  differently: Take 1 tablet by mouth daily.), Disp: 30 tablet, Rfl: 0   ondansetron  (ZOFRAN) 4 MG tablet, Take 1 tablet (4 mg total) by mouth every 6 (six) hours as needed for nausea., Disp: 20 tablet, Rfl: 0   polyethylene glycol powder (GLYCOLAX/MIRALAX) 17 GM/SCOOP powder, Take 17 g by mouth daily as needed., Disp: 1350 g, Rfl: 3   potassium chloride SA (KLOR-CON M) 20 MEQ tablet, Take 1 tablet (20 mEq total) by mouth daily., Disp: 3 tablet, Rfl: 0   tiZANidine (ZANAFLEX) 4 MG tablet, Take 4 mg by mouth 3 (three) times daily as needed for muscle spasms., Disp: , Rfl:    VITAMIN D PO, Take 1 capsule by mouth daily. , Disp: , Rfl:    Vitamin D, Ergocalciferol, (DRISDOL) 1.25 MG (50000 UNIT) CAPS capsule, Take 1 capsule (50,000 Units total) by mouth every 7 (seven) days., Disp: 12 capsule, Rfl: 0   ziprasidone (GEODON) 20 MG capsule, Take 20 mg by mouth 2 (two) times daily with a meal., Disp: , Rfl:   Clinical Interview:   The following information was obtained during a clinical interview with Ms. Younkin during her previous evaluation in March 2021. Sections have been updated with new information as necessary.  Cognitive Symptoms: Decreased short-term memory: Endorsed. She previously reported trouble remembering the details of previous conversations, asking repetitive questions, trouble remembering the names of familiar individuals, and misplacing things around the home. Difficulties were said to be present for the past five years and have continued to progressively worsen, including in the time since her previous evaluation.  Decreased long-term memory: Denied. Decreased attention/concentration: Endorsed. She previously noted frequently losing her train of thought mid-sentence, as well as trouble staying focused and being more easily distracted. These difficulties were said to have been present for the past three years and have also progressively worsened over time.  Reduced processing speed: Endorsed. She previously described her processing speed as seeming "slowed down." This was  said to be consistent.  Difficulties with executive functions: Endorsed. She previously reported primary difficulties surrounding organization, complex planning, and multi-tasking. She denied difficulties with impulsivity or using good judgment. Overt personality changes (outside of some mildly increased agitation or frustration) were denied. This was said to be stable.  Difficulties with emotion regulation: Denied. Difficulties with receptive language: Endorsed. Difficulties were said to be particularly present when the individual speaking to her speaks too quickly for her to process. This was said to be stable.  Difficulties with word finding: Endorsed. Decreased visuoperceptual ability: Denied.   Difficulties completing ADLs: Endorsed. Previously, she denied difficulties with medication management. However, in the time since her previous evaluation, her sister has taken over medication management out of necessity. She previously reported that her sister had taken over bill paying and financial management, which has remained consistent. Her sister also reportedly took away Ms. Paez's keys after an instance where she got turned around on a familiar route over two years ago. She has not driven since that time.  Additional Medical History: History of traumatic brain injury/concussion: Denied. History of stroke: Denied. History of seizure activity: Endorsed (see above). Ms. Heitzenrater reported that her most recent seizure occurred approximately well over five years ago. History of known exposure to toxins: Denied. Symptoms of chronic pain: Endorsed. Medical records suggest a history of fibromyalgia. She previously described prominent back pain and was using OTC medications for pain relief. These were said to "tone it down" but never eliminate pain symptoms. This was said to  be stable.  Experience of frequent headaches/migraines: Endorsed. While she previously reported headache symptoms occurring  approximately once per week, she noted that these difficulties had improved and diminished in their frequency. Medical records suggest a remote history of migraine headaches. Frequent instances of dizziness/vertigo: Endorsed. Previously, symptoms were said to be present upon first awakening and when standing too quickly from a previously seated or prone position. They have contributed to several falls in the past, including a somewhat recent fall where she hit her face, resulting in a black eye and bloody nose. No loss of consciousness was reported. This was said to be stable.    Sensory changes: Since her previous evaluation, she reported having a formal eye exam and undergoing cataract surgery. While she reported improved vision following this procedure, she still acknowledged that her vision "is not what it should be." Other sensory changes/difficulties (e.g., hearing, taste, or smell) were denied.  Balance/coordination difficulties: Endorsed. She previously reported ongoing balance instability. Symptoms were said to be a combination of feeling dizzy/lightheaded, experiencing shortness of breath, chronic back pain, and feeling weakness in her lower extremities (right worse than left). She previously estimated an average of two falls per month. However, during the current evaluation, she denied any recent falls.  Other motor difficulties: Endorsed. She previously reported bilateral tremulous behaviors in her hands, but only during periods of heightened stress or anxiety. This was said to be stable.   Sleep History: Estimated hours obtained each night: 7 hours.  Difficulties falling asleep: Endorsed. She previously acknowledged it occasionally taking her a long amount of time to initially fall asleep. This was said to be stable. Difficulties staying asleep: Endorsed. She previously described her sleep as very "broken," stating that she often wakes throughout the night due to pain symptoms, as well as  times where the reason for her awakening is unclear. This was said to be stable.  Feels rested and refreshed upon awakening: Variably so.   History of snoring: Endorsed. History of waking up gasping for air: Endorsed. Witnessed breath cessation while asleep: Endorsed. She reported being previously diagnosed with mild obstructive sleep apnea. She stated that she was not prescribed a CPAP machine, but uses an oxygen machine nightly. This was said to be stable.    History of vivid dreaming: Endorsed. She previously reported a recent recurring dream in which she finds a baby in her bed. She described the baby as "pretty" at first, but upon further inspection, reports it having a disfigured face. She is unclear regarding potential reasons for this recurring dream, but noted that she only sees this child while asleep.  Excessive movement while asleep: Endorsed. Instances of acting out her dreams: Endorsed. She previously reported a history of talking in her sleep (i.e., asking and answering her own questions), as well as instances where she moves her hands around. She further reported sometimes "tear[ing] the bed up" and waking up with her "sheets everywhere." These behaviors were said to have persisted and become more frequent relative to her previous evaluation. Total symptom duration appears to be around 2.5 years.   Psychiatric/Behavioral Health History: Depression: Endorsed. Ms. Gosser previously acknowledged a history of bipolar I disorder, with her most recent depressive episode occurring around the time of her past evaluation. She also endorsed prior suicidal ideation with intent, and attempted suicide via prescription medication overdose 4-5 years prior. She reported calling for help after she ingested this mediation. Currently, she described her mood as "pretty good" and reported her belief that  her bipolar symptoms are well managed. Anxiety: Endorsed. She previously reported longstanding symptoms  of generalized anxiety, as well as more recent symptoms surrounding her subjective cognitive decline. This was said to be stable.  Mania: Endorsed. She reported that her most recent manic episode occurred approximately 2.5 years prior.  Trauma History: Endorsed. She noted during the previous evaluation that she had been dealing with the abrupt and unexpected passing of several young family members over a relatively short period of time.  Visual/auditory hallucinations: Endorsed. She described instances where she will look at a chair in her environment and see a fully-formed individual. This was a new symptom relative to her previous evaluation and has recently been occurring more frequently.  Delusional thoughts: Denied.   Tobacco: Denied. Alcohol: She denied current alcohol consumption as well as a history of prior alcohol abuse or dependence.  Recreational drugs: Denied.  Family History: Problem Relation Age of Onset   Heart disease Father    Ovarian cancer Mother    Breast cancer Mother    Alzheimer's disease Sister 37   Colon cancer Neg Hx    Esophageal cancer Neg Hx    Stomach cancer Neg Hx    Rectal cancer Neg Hx    This information was confirmed by Ms. Joya Gaskins.  Academic/Vocational History: Highest level of educational attainment: 14 years. She graduated from high school and completed an additional two years of college. She described herself as a good (A/B) student in academic settings. Math was noted as a relative weakness.  History of developmental delay: Denied. History of grade repetition: Denied. Enrollment in special education courses: Denied. History of LD/ADHD: Denied.   Employment: Ms. Escajeda currently receives disability benefits due to her various medical and psychiatric ailments. 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   Evaluation Results:   Behavioral Observations: Ms. Choate was accompanied in the waiting  room but was unaccompanied for the current interview. She arrived to her appointment on time and was appropriately dressed and groomed. She appeared alert and oriented. She ambulated with the assistance of a rolling walker but moved at an appropriate speed and did not exhibit frank balance instability. Gross motor functioning appeared intact upon informal observation and no abnormal movements (e.g., tremors) were noted. Her affect was generally relaxed and positive. Spontaneous speech was fluent and word finding difficulties were not observed during the clinical interview. There was a single instance where she appeared to lose her train of thought while answering questions. Thought processes were coherent, organized, and normal in content. Insight into her cognitive difficulties appeared adequate.   During testing, mild tremors were observed bilaterally. Sustained attention was diminished at times and she appeared to unintentionally lose her place or her train of thought often. Some of this was likely related to memory dysfunction as she would forget task instructions during tasks. There was a notable response latency after being provided instructions in general, thought to be related to diminished processing speed and ability. Task engagement was adequate and she persisted when challenged. The psychometrist felt that she worked very hard during testing despite performance across validity tasks (see below). At the end, Ms. Guarino acknowledged frustration surrounding her inability to remember and think like she used to. Overall, Ms. Pistole was cooperative with the clinical interview and subsequent testing procedures.   Adequacy of Effort: The validity of neuropsychological testing is limited by the extent to which the individual being tested may be assumed to have  exerted adequate effort during testing. Ms. Wolaver expressed her intention to perform to the best of her abilities and exhibited adequate task  engagement and persistence. Scores across stand-alone and embedded performance validity measures were variable. While this suggests it would be prudent to interpret obtained test scores with some caution, I do believe that there is a significant degree of ongoing cognitive impairment. As she already receives disability benefits, I also am unaware of any other secondary gain which Ms. Mangrum could hope to obtain by purposefully performing poorly. As such, the results of the current evaluation are believed to be a generally adequate representation of Ms. Clermont's current cognitive functioning.  Test Results: Ms. Liebig was fully oriented at the time of the current evaluation.  Intellectual abilities based upon educational and vocational attainment were estimated to be in the below average to average range. Premorbid abilities were estimated to be within the well below average range based upon a single-word reading test.   Processing speed was exceptionally low. Basic attention was below average. More complex attention (e.g., working memory) was variable, ranging from the exceptionally low to below average normative ranges. Executive functioning was exceptionally low to well below average.  Assessed receptive language abilities were exceptionally low. She exhibited difficulties across all aspects of this task, including understanding conceptual information, sequencing, understanding complex sentence structure, and following multi-step commands. Assessed expressive language (e.g., verbal fluency and confrontation naming) was exceptionally low to well below average.     Assessed visuospatial/visuoconstructional abilities were largely exceptionally low to well below average. Points were lost for her combining the numbers 10 and 11 into a single number, as well as there being no size differentiation between clock hands. Points were lost on her copy of a complex figure due to very significant visual distortions,  including Ms. Vandenheuvel drawing more individual shapes rather than a single, coherent figure.    Learning (i.e., encoding) of novel verbal and visual information was exceptionally low. Spontaneous delayed recall (i.e., retrieval) of previously learned information was also exceptionally low. Retention rates were 100% (taw score of 1) across a story learning task, 38% across a list learning task, and 0% across a shape learning task. Performance across recognition tasks was below expectation, suggesting minimal evidence for information consolidation.   Results of emotional screening instruments suggested that recent symptoms of generalized anxiety were in the moderate range, while symptoms of depression were within the severe range. A screening instrument assessing recent sleep quality suggested the presence of mild sleep dysfunction.  Tables of Scores:   Note: This summary of test scores accompanies the interpretive report and should not be considered in isolation without reference to the appropriate sections in the text. Descriptors are based on appropriate normative data and may be adjusted based on clinical judgment. Terms such as "Within Normal Limits" and "Outside Normal Limits" are used when a more specific description of the test score cannot be determined. Descriptors refer to the current evaluation only.         Percentile - Normative Descriptor > 98 - Exceptionally High 91-97 - Well Above Average 75-90 - Above Average 25-74 - Average 9-24 - Below Average 2-8 - Well Below Average < 2 - Exceptionally Low        Validity:    DESCRIPTOR   March 2021 Current    ACS Word Choice: --- --- --- Outside Normal Limits  Dot Counting Test: --- --- --- Outside Normal Limits  WAIS-IV Reliable Digit Span: --- --- --- Within Normal  Limits  HVLT-R Recognition Discrimination Index: --- --- --- Within Normal Limits  BVMT-R Retention Percentage: --- --- --- Outside Normal Limits        Orientation:        Raw Score Raw Score Percentile   NAB Orientation, Form 1 26/29 29/29 --- ---        Cognitive Screening:       Raw Score Raw Score Percentile   SLUMS: --- 12/30 --- ---        Intellectual Functioning:       Standard Score Standard Score Percentile   Test of Premorbid Functioning: 78 75 5 Well Below Average        Memory:      Wechsler Memory Scale (WMS-IV):                       Raw Score (Scaled Score) Raw Score (Scaled Score) Percentile     Logical Memory I 12/50 (4) 1/50 (1) <1 Exceptionally Low    Logical Memory II 3/50 (2) 1/50 (1) <1 Exceptionally Low    Logical Memory Recognition 20/30 18/30 3-9 Well Below Average        Hopkins Verbal Learning Test (HVLT-R), Form 1: Raw Score (T Score) Raw Score (T Score) Percentile     Total Trials 1-3 16/36 (22) 12/36 (<20) <1 Exceptionally Low    Delayed Recall 2/12 (<20) 3/12 (<20) <1 Exceptionally Low    Recognition Discrimination Index 4 (<20) 7 (20) <1 Exceptionally Low      True Positives 6 9 --- ---      False Positives 2 2 --- ---         Brief Visuospatial Memory Test (BVMT-R), Form 1: Raw Score (T Score) Raw Score (T Score) Percentile     Total Trials 1-3 1/36 (20) 1/36 (20) <1 Exceptionally Low    Delayed Recall 0/12 (20) 0/12 (20) <1 Exceptionally Low    Recognition Discrimination Index 3 5 11-16 Below Average      Recognition Hits 6/6 5/6 11-16 Below Average      False Positive Errors 3 0 >16 Within Normal Limits         Attention/Executive Function:      Trail Making Test (TMT): Raw Score (T Score) Raw Score (T Score) Percentile     Part A 81 secs.,  0 errors (23) 136 secs.,  0 errors (19) <1 Exceptionally Low    Part B Discontinued Discontinued --- Impaired         Symbol Digit Modalities Test (SDMT): Raw Score (Z-Score) Raw Score (Z-Score) Percentile     Written 18 (-3.56) 10 (-4.52) <1 Exceptionally Low    Oral 20 (-3.86) 16 (-4.3) <1 Exceptionally Low         Scaled Score Scaled Score Percentile    WAIS-IV Digit Span: _0 Well Below Average    Forward _1 Below Average    Backward _2 Below Average    Sequencing 2 2 <1 Exceptionally Low         Scaled Score Scaled Score Percentile   WAIS-IV Similarities: _3 Well Below Average        D-KEFS Color-Word Interference Test: Raw Score (Scaled Score) Raw Score (Scaled Score) Percentile     Color Naming 45 secs. (4) 76 secs. (1) <1 Exceptionally Low    Word Reading 35 secs. (3) 71 secs. (1) <1 Exceptionally Low    Inhibition 174 secs. (  1) 136 secs. (1) <1 Exceptionally Low      Total Errors 21 errors (1) 15 errors (1) <1 Exceptionally Low    Inhibition/Switching 112 secs. (4) 152 secs. (1) <1 Exceptionally Low      Total Errors 8 errors (4) 27 errors (1) <1 Exceptionally Low        D-KEFS Verbal Fluency Test: Raw Score (Scaled Score) Raw Score (Scaled Score) Percentile     Letter Total Correct 11 (2) 18 (4) 2 Well Below Average    Category Total Correct 17 (2) 16 (1) <1 Exceptionally Low    Category Switching Total Correct 8 (4) 10 (6) 9 Below Average    Category Switching Accuracy 7 (5) 9 (7) 16 Below Average      Total Set Loss Errors 3 (9) 5 (6) 9 Below Average      Total Repetition Errors 1 (12) 2 (11) 63 Average        Language:      Verbal Fluency Test: Raw Score (T Score) Raw Score (T Score) Percentile     Phonemic Fluency (FAS) 11 (23) 18 (34) 5 Well Below Average    Animal Fluency 7 (24) 7 (24) <1 Exceptionally Low         NAB Language Module, Form 1: T Score T Score Percentile     Auditory Comprehension 19 19 <1 Exceptionally Low    Naming 26/31 (27) 27/31 (30) 2 Well Below Average        Visuospatial/Visuoconstruction:       Raw Score Raw Score Percentile   Clock Drawing: 9/10 7/10 --- Within Normal Limits  BVMT-R, Copy: --- 1/12 --- Outside Normal Limits        NAB Spatial Module, Form 1: T Score T Score Percentile     Visual Discrimination --- 30 2 Well Below Average    Figure Drawing Copy _0 Exceptionally Low        Mood and Personality:       Raw Score Raw Score Percentile   Beck Depression Inventory - II: 20 40 --- Severe  Beck Anxiety Inventory: 38 21 --- Moderate        Additional Questionnaires:       Raw Score Raw Score Percentile   PROMIS Sleep Disturbance Questionnaire: 28 27 --- Mild   Informed Consent and Coding/Compliance:   The current evaluation represents a clinical evaluation for the purposes previously outlined by the referral source and is in no way reflective of a forensic evaluation.   Ms. Kinnaird was provided with a verbal description of the nature and purpose of the present neuropsychological evaluation. Also reviewed were the foreseeable risks and/or discomforts and benefits of the procedure, limits of confidentiality, and mandatory reporting requirements of this provider. The patient was given the opportunity to ask questions and receive answers about the evaluation. Oral consent to participate was provided by the patient.   This evaluation was conducted by Christia Reading, Ph.D., ABPP-CN, board certified clinical neuropsychologist. Ms. Cure completed a clinical interview with Dr. Melvyn Novas, billed as one unit [phone], and 160 minutes of cognitive testing and scoring, billed as one unit [phone] and four additional units [id number]. Psychometrist Cruzita Lederer, B.S., assisted Dr. Melvyn Novas with test administration and scoring procedures. As a separate and discrete service, Dr. Melvyn Novas spent a total of 160 minutes in interpretation and report writing billed as one unit [phone] and two units 96133.

## 2021-08-06 ENCOUNTER — Other Ambulatory Visit: Payer: Self-pay | Admitting: Family Medicine

## 2021-08-06 DIAGNOSIS — D841 Defects in the complement system: Secondary | ICD-10-CM

## 2021-08-06 DIAGNOSIS — G8929 Other chronic pain: Secondary | ICD-10-CM

## 2021-08-06 LAB — LUPUS (SLE) ANALYSIS
Anti Nuclear Antibody (ANA): NEGATIVE
Anti-striation Abs: NEGATIVE
Complement C4, Serum: 62 mg/dL — ABNORMAL HIGH (ref 12–38)
ENA RNP Ab: <0.2 AI (ref 0.0–0.9)
ENA SM Ab Ser-aCnc: <0.2 AI (ref 0.0–0.9)
ENA SSA (RO) Ab: <0.2 AI (ref 0.0–0.9)
ENA SSB (LA) Ab: <0.2 AI (ref 0.0–0.9)
Mitochondrial Ab: <20 Units (ref 0.0–20.0)
Parietal Cell Ab: 6.8 Units (ref 0.0–20.0)
Scleroderma (Scl-70) (ENA) Antibody, IgG: <0.2 AI (ref 0.0–0.9)
Smooth Muscle Ab: 9 Units (ref 0–19)
Thyroperoxidase Ab SerPl-aCnc: <9 IU/mL (ref 0–34)
dsDNA Ab: <1 IU/mL (ref 0–9)

## 2021-08-08 ENCOUNTER — Telehealth: Payer: Self-pay | Admitting: Family Medicine

## 2021-08-08 NOTE — Telephone Encounter (Signed)
Left voicemail for patient to call the office 

## 2021-08-08 NOTE — Telephone Encounter (Addendum)
Pt has some questions about her blood work results she had on 08-04-2021

## 2021-08-11 NOTE — Telephone Encounter (Signed)
Pt is calling back

## 2021-08-13 ENCOUNTER — Telehealth: Payer: Self-pay

## 2021-08-13 DIAGNOSIS — E049 Nontoxic goiter, unspecified: Secondary | ICD-10-CM | POA: Diagnosis not present

## 2021-08-13 DIAGNOSIS — Z01419 Encounter for gynecological examination (general) (routine) without abnormal findings: Secondary | ICD-10-CM | POA: Diagnosis not present

## 2021-08-13 DIAGNOSIS — Z1272 Encounter for screening for malignant neoplasm of vagina: Secondary | ICD-10-CM | POA: Diagnosis not present

## 2021-08-13 NOTE — Telephone Encounter (Signed)
LVM instructions for pt to return call.

## 2021-08-13 NOTE — Telephone Encounter (Signed)
Fax received from [facility] stating 3rd request for surgery clearance for a right total knee replacement (surgery date pending). Forms received signed with date of 04/17/20. Last OV 08/04/21  Claiborne Billings states it was dated in 2021 b/c pt had to lose weight, which she has done. This is the 1st attempt they have sent since the pt has lost weight.   Will send form to PCP for completion.

## 2021-08-14 ENCOUNTER — Telehealth: Payer: Self-pay | Admitting: Nurse Practitioner

## 2021-08-14 ENCOUNTER — Ambulatory Visit: Payer: Medicare HMO | Admitting: Nurse Practitioner

## 2021-08-14 ENCOUNTER — Other Ambulatory Visit: Payer: Self-pay

## 2021-08-14 ENCOUNTER — Other Ambulatory Visit: Payer: Self-pay | Admitting: Obstetrics and Gynecology

## 2021-08-14 ENCOUNTER — Ambulatory Visit (INDEPENDENT_AMBULATORY_CARE_PROVIDER_SITE_OTHER): Payer: Medicare HMO

## 2021-08-14 ENCOUNTER — Encounter: Payer: Self-pay | Admitting: Nurse Practitioner

## 2021-08-14 VITALS — BP 120/76 | HR 83 | Temp 98.4°F | Ht 69.0 in | Wt 260.0 lb

## 2021-08-14 DIAGNOSIS — T782XXA Anaphylactic shock, unspecified, initial encounter: Secondary | ICD-10-CM | POA: Insufficient documentation

## 2021-08-14 DIAGNOSIS — Z01818 Encounter for other preprocedural examination: Secondary | ICD-10-CM | POA: Diagnosis not present

## 2021-08-14 DIAGNOSIS — J4540 Moderate persistent asthma, uncomplicated: Secondary | ICD-10-CM

## 2021-08-14 DIAGNOSIS — E049 Nontoxic goiter, unspecified: Secondary | ICD-10-CM

## 2021-08-14 DIAGNOSIS — G4734 Idiopathic sleep related nonobstructive alveolar hypoventilation: Secondary | ICD-10-CM | POA: Diagnosis not present

## 2021-08-14 DIAGNOSIS — J309 Allergic rhinitis, unspecified: Secondary | ICD-10-CM

## 2021-08-14 DIAGNOSIS — T782XXS Anaphylactic shock, unspecified, sequela: Secondary | ICD-10-CM

## 2021-08-14 DIAGNOSIS — Z01811 Encounter for preprocedural respiratory examination: Secondary | ICD-10-CM | POA: Insufficient documentation

## 2021-08-14 MED ORDER — EPINEPHRINE 0.3 MG/0.3ML IJ SOAJ: 0.3000 mg | Freq: Every day | INTRAMUSCULAR | 3 refills | Status: DC | PRN

## 2021-08-14 MED ORDER — FLUTICASONE-UMECLIDIN-VILANT 100-62.5-25 MCG/ACT IN AEPB: 1.0000 | INHALATION_SPRAY | Freq: Every day | RESPIRATORY_TRACT | 3 refills | Status: DC

## 2021-08-14 NOTE — Progress Notes (Signed)
@Patient  ID: [name], female    DOB: 07-31-1960, 61 y.o.   MRN: [id number]  Chief Complaint  Patient presents with   Follow-up    She reports that she is doing well wit her breathing and needs a refill on her inhaler and epi pen. She is having a Right total knee replacement.      Referring provider: Billie Ruddy, MD  HPI: 61 year old female, former smoker (5 pack years) followed for moderate persistent asthma, allergic rhinitis, chronic respiratory failure. She is a patient of Dr. Janee Morn and last seen in office on 10/21/2020. Past medical history significant for HTN, neurocognitive disorder, GERD, vocal cord disorder, thyroid disease, seizure disorder, bipolar I, GAD, chronic back pain, obesity, fibromyalgia, schizoaffective, HLD.   TEST/EVENTS:  10/21/2020 PFTs: FVC 2.53 (76), FEV1 1.89 (72), ratio 75, DLCO 98%. +BD  10/21/2020: OV with Dr. Annamaria Boots for f/u after PFTs. Trial Trelegy 100 daily - stepped up from Advair. Deemed low to moderate postop complication risk. Continued 2 lpm nocturnal O2  08/14/2021: Today - surgical clearance Patient presents today for surgical clearance for RTKA, which has not been scheduled yet. Dr. Percell Miller will be performing her surgery. She is not sure about type of anesthesia yet. She reports her breathing has been stable and she has not had any recent acute flares. She continues on Trelegy daily. She has been using her Flovent maybe once or twice a month. She denies wheezing, cough, orthopnea, PND, chest pain or lower extremity swelling. Her allergy and GERD symptoms are well-controlled. She has stopped using her nocturnal oxygen. Previously sleep study without OSA per pt. She denies daytime fatigue, morning headaches, snoring, or drowsy driving.  Allergies  Allergen Reactions   Bee Venom Anaphylaxis   Depakote [Divalproex Sodium] Shortness Of Breath and Rash   Haloperidol Lactate Swelling   Hornet Venom Anaphylaxis   Latex Anaphylaxis    mild  rash/itching, wheezing/sob   Peanut-Containing Drug Products Anaphylaxis   Penicillins Anaphylaxis and Swelling   Quetiapine Anaphylaxis and Other (See Comments)   Shellfish-Derived Products Anaphylaxis   Valproic Acid Swelling   Amitriptyline Hcl Other (See Comments)    HALLUCINATIONS   Butorphanol Tartrate Other (See Comments)    Hallucinations   Gabapentin Other (See Comments)    Pt reports severe back and side pain   Iodine Other (See Comments)    Flushing and fainting   Pregabalin Rash   Terbutaline Rash    Immunization History  Administered Date(s) Administered   Influenza Split 04/06/2012, 03/06/2013   Influenza Whole 04/08/2009, 04/05/2010   Influenza,inj,Quad PF,6+ Mos 03/22/2013, 09/01/2018, 04/24/2020   PFIZER(Purple Top)SARS-COV-2 Vaccination 09/30/2019, 10/21/2019   Pneumococcal Polysaccharide-23 04/15/2009, 07/07/2011, 04/06/2012    Past Medical History:  Diagnosis Date   Abdominal pain 09/27/2009   Allergic rhinitis 09/20/2008   Allergy skin test 10/10/08 Allergy Profile 01/18/2013-total IgE 242.4 with broad elevations for common inhalant allergens.    Arthritis    oa   Asthma, moderate persistent 06/04/2009   Allergy Profile 01/18/2013-total IgE 242.4 with broad elevations for common inhalant allergens. PFT 07/05/08- FVC 3.51/ 88%, FEV1 2.38/ 78%, R 0.68, 25-75% 1.45/ 44%, TLC 83%, DLCO 68% PFT 10/21/20- Moderate obstructive airways disease with slight respnse to bronchodilator. Normal Diffusion. FEV!/FVC 0.75    Backache, unspecified    Bipolar I disorder 06/13/2008   Chronic back pain 06/13/2008   Chronic respiratory failure with hypoxia 05/06/2020   Stable with routine use of O2 2L during sleep. Nocturnal hypoxemia with  normal daytime oxygenation on room air.    Class 3 severe obesity due to excess calories with serious comorbidity and body mass index (BMI) of 40.0 to 44.9 in adult 12/19/2018   Closed left ankle fracture    DDD (degenerative disc disease),  lumbosacral 10/03/2013   Disorder of sacrum 11/03/2013   Dysphagia, pharyngoesophageal phase 09/12/2008   Elevated LFTs 10/15/2010   Female pelvic peritoneal adhesions 12/19/2018   Fibromyalgia    Galactorrhea not associated with childbirth 12/19/2018   Generalized anxiety disorder 04/05/2012   GERD (gastroesophageal reflux disease) 07/05/2008   Heart disease 12/19/2018   Hiatal hernia 11/03/2013   Hyperglycemia 06/13/2008   Hypertension    Intractable migraine with aura 11/03/2013   Lumbar radiculopathy 08/30/2012   Lumbosacral spondylosis 11/03/2013   Major depressive disorder 09/08/2019   Major neurocognitive disorder due to multiple etiologies 08/05/2021   Mixed hyperlipidemia 10/04/2019   On home oxygen therapy    nightly at 2l/m prn   Open left ankle fracture 07/16/2020   Oropharyngeal dysphagia 03/01/2018   OSA (obstructive sleep apnea) 06/13/2008   Pain in pelvis 12/19/2018   PONV (postoperative nausea and vomiting)    Schizoaffective disorder, bipolar type 10/04/2019   Seizure disorder    last seizure 2014   Status post open reduction with internal fixation (ORIF) of fracture of ankle 08/26/2020   L ankle   Thyroid disease 10/03/2013   Vitamin D deficiency 10/04/2019   Vocal cord disorder 04/02/2009   Weakness 09/11/2016    Tobacco History: Social History   Tobacco Use  Smoking Status Former   Packs/day: 1.00   Years: 5.00   Pack years: 5.00   Types: Cigarettes   Quit date: 07/07/2007   Years since quitting: 14.1  Smokeless Tobacco Never   Counseling given: Not Answered   Outpatient Medications Prior to Visit  Medication Sig Dispense Refill   acetaminophen (TYLENOL) 500 MG tablet Take 1,000 mg by mouth every 6 (six) hours as needed for moderate pain.     albuterol (PROVENTIL) (2.5 MG/3ML) 0.083% nebulizer solution Take 3 mLs by nebulization every 4 (four) hours as needed for wheezing or shortness of breath. 75 mL 12   aspirin EC 81 MG EC tablet Take 1  tablet (81 mg total) by mouth 2 (two) times daily. Swallow whole. 30 tablet 11   benztropine (COGENTIN) 0.5 MG tablet Take 0.5 mg by mouth daily.      famotidine (PEPCID) 20 MG tablet Take 1 tablet (20 mg total) by mouth 2 (two) times daily. For stomach acid. (Patient taking differently: Take 20 mg by mouth daily.) 60 tablet 0   FLUoxetine (PROZAC) 40 MG capsule Take 80 mg by mouth daily.     HYDROcodone-acetaminophen (NORCO/VICODIN) 5-325 MG tablet Take 1-2 tablets by mouth every 4 (four) hours as needed for moderate pain (pain score 4-6). 30 tablet 0   hydrOXYzine (ATARAX/VISTARIL) 25 MG tablet Take 25 mg by mouth 4 (four) times daily as needed for anxiety or itching.  2   lamoTRIgine (LAMICTAL) 200 MG tablet Take 200 mg by mouth at bedtime.     metoprolol succinate (TOPROL-XL) 50 MG 24 hr tablet Take with or immediately following a meal. 90 tablet 1   mirtazapine (REMERON) 15 MG tablet Take 15 mg by mouth at bedtime.     Multiple Vitamin (MULTIVITAMIN WITH MINERALS) TABS Take 1 tablet by mouth daily. For nutritional supplementation. (Patient taking differently: Take 1 tablet by mouth daily.) 30 tablet 0   ondansetron (ZOFRAN)  4 MG tablet Take 1 tablet (4 mg total) by mouth every 6 (six) hours as needed for nausea. 20 tablet 0   polyethylene glycol powder (GLYCOLAX/MIRALAX) 17 GM/SCOOP powder Take 17 g by mouth daily as needed. 1350 g 3   potassium chloride SA (KLOR-CON M) 20 MEQ tablet Take 1 tablet (20 mEq total) by mouth daily. 3 tablet 0   tiZANidine (ZANAFLEX) 4 MG tablet Take 4 mg by mouth 3 (three) times daily as needed for muscle spasms.     VITAMIN D PO Take 1 capsule by mouth daily.      Vitamin D, Ergocalciferol, (DRISDOL) 1.25 MG (50000 UNIT) CAPS capsule Take 1 capsule (50,000 Units total) by mouth every 7 (seven) days. 12 capsule 0   ziprasidone (GEODON) 20 MG capsule Take 20 mg by mouth 2 (two) times daily with a meal.     EPINEPHrine 0.3 mg/0.3 mL IJ SOAJ injection Inject 0.3 mg  into the muscle daily as needed for anaphylaxis.     Fluticasone-Salmeterol (ADVAIR) 500-50 MCG/DOSE AEPB Inhale 1 puff into the lungs 2 (two) times daily.     Fluticasone-Umeclidin-Vilant (TRELEGY ELLIPTA) 100-62.5-25 MCG/INH AEPB Inhale 1 puff into the lungs daily. 28 each 0   No facility-administered medications prior to visit.     Review of Systems:   Constitutional: No weight loss or gain, night sweats, fevers, chills, fatigue, or lassitude. HEENT: No headaches, difficulty swallowing, tooth/dental problems, or sore throat. No sneezing, itching, ear ache, nasal congestion, or post nasal drip CV:  No chest pain, orthopnea, PND, swelling in lower extremities, anasarca, dizziness, palpitations, syncope Resp: No shortness of breath with exertion or at rest. No excess mucus or change in color of mucus. No productive or non-productive. No hemoptysis. No wheezing.  No chest wall deformity GI:  No heartburn, indigestion, abdominal pain, nausea, vomiting, diarrhea, change in bowel habits, loss of appetite, bloody stools.  GU: No dysuria, change in color of urine, urgency or frequency.  No flank pain, no hematuria  Skin: No rash, lesions, ulcerations MSK:  R chronic knee pain. No joint swelling.  No decreased range of motion.  No back pain. Neuro: No dizziness or lightheadedness.  Psych: No depression or anxiety. Mood stable.     Physical Exam:  BP 120/76 (BP Location: Left Arm, Patient Position: Sitting, Cuff Size: Large)    Pulse 83    Temp 98.4 F (36.9 C) (Oral)    Ht 5\' 9"  (1.753 m)    Wt 260 lb (117.9 kg)    SpO2 98%    BMI 38.40 kg/m   GEN: Pleasant, interactive, well-appearing; obese; in no acute distress. HEENT:  Normocephalic and atraumatic. EACs patent bilaterally. TM pearly gray with present light reflex bilaterally. PERRLA. Sclera white. Nasal turbinates pink, moist and patent bilaterally. No rhinorrhea present. Oropharynx pink and moist, without exudate or edema. No lesions,  ulcerations, or postnasal drip.  NECK:  Supple w/ fair ROM. No JVD present. Normal carotid impulses w/o bruits. Thyroid symmetrical with no goiter or nodules palpated. No lymphadenopathy.   CV: RRR, no m/r/g, no peripheral edema. Pulses intact, +2 bilaterally. No cyanosis, pallor or clubbing. PULMONARY:  Unlabored, regular breathing. Clear bilaterally A&P w/o wheezes/rales/rhonchi. No accessory muscle use. No dullness to percussion. GI: BS present and normoactive. Soft, non-tender to palpation. No organomegaly or masses detected. No CVA tenderness. MSK: No erythema, warmth or tenderness. Cap refil <2 sec all extrem. No deformities or joint swelling noted.  Neuro: A/Ox3. No focal deficits noted.  Skin: Warm, no lesions or rashe Psych: Normal affect and behavior. Judgement and thought content appropriate.     Lab Results:  CBC    Component Value Date/Time   WBC 6.1 08/04/2021 1050   RBC 5.12 (H) 08/04/2021 1050   HGB 13.2 08/04/2021 1050   HCT 40.7 08/04/2021 1050   PLT 330.0 08/04/2021 1050   MCV 79.4 08/04/2021 1050   MCH 26.5 07/09/2020 2008   MCHC 32.5 08/04/2021 1050   RDW 16.9 (H) 08/04/2021 1050   LYMPHSABS 2.3 08/04/2021 1050   MONOABS 0.4 08/04/2021 1050   EOSABS 0.2 08/04/2021 1050   BASOSABS 0.0 08/04/2021 1050    BMET    Component Value Date/Time   NA 138 08/04/2021 1050   K 3.4 (L) 08/04/2021 1050   CL 99 08/04/2021 1050   CO2 26 08/04/2021 1050   GLUCOSE 112 (H) 08/04/2021 1050   BUN 13 08/04/2021 1050   CREATININE 1.10 08/04/2021 1050   CREATININE 1.10 (H) 04/24/2020 1107   CALCIUM 9.7 08/04/2021 1050   GFRNONAA >60 07/09/2020 2008   GFRNONAA 55 (L) 04/24/2020 1107   GFRAA 64 04/24/2020 1107    BNP No results found for: BNP   Imaging:  No results found.    PFT Results Latest Ref Rng & Units 10/21/2020  FVC-Pre L 2.48  FVC-Predicted Pre % 75  FVC-Post L 2.53  FVC-Predicted Post % 76  Pre FEV1/FVC % % 68  Post FEV1/FCV % % 75  FEV1-Pre L  1.69  FEV1-Predicted Pre % 64  FEV1-Post L 1.89  DLCO uncorrected ml/min/mmHg 23.75  DLCO UNC% % 98  DLVA Predicted % 135    No results found for: NITRICOXIDE      Assessment & Plan:   Asthma, moderate persistent Stable. No recent flares. Switched from Advair to Trelegy at last visit with improvement. Continue Trelegy today and PRN albuterol.   Patient Instructions  Continue Trelegy 1 puff daily, brush tongue and rinse mouth afterwards -Continue Albuterol inhaler 2 puffs or 3 mL neb every 6 hours as needed for shortness of breath or wheezing. Notify if symptoms persist despite rescue inhaler/neb use. -Continue famotidine (pepcid) 20 mg Twice daily as needed for reflux  Asthma Action Plan in place Rinse mouth after inhaled corticosteroid use.  Avoid triggers, when able.  Exercise encouraged. Notify if worsening symptoms upon exertion.  Notify and seek help if symptoms unrelieved by rescue inhaler.  Chest x ray today for surgical clearance.  Overnight oximetry study to determine if you need to continue your supplemental oxygen at night  Take your inhalers with you when you go in for surgery. Please let us know if you need anything before or after. Ensure you are up and mobile as soon as possible after surgery. Use your incentive spirometer 10x/hr while awake. I hope everything goes well!  Follow up in 6 months with Dr. Annamaria Boots or Alanson Aly. If symptoms do not improve or worsen, please contact office for sooner follow up or seek emergency care.    Nocturnal hypoxia Previously ordered to be on 2 lpm at night; although, I can not find a study indicating the necessity. Has not been wearing for a while. Will obtain ONO to ensure she is appropriate to be on room air at night.   Allergic rhinitis Well-controlled. Notify of any flare in symptoms going into allergy season.  Anaphylaxis Hx of anaphylaxis r/t bee sting. Epi pen expired. Refill sent today.  Preoperative  respiratory examination RTKA planned for upcoming  months with Dr. Percell Miller. Asthma has been stable with no recent flares. Low to moderate surgical risk. Factors that increase the risk for postoperative pulmonary complications are asthma, nocturnal hypoxia, and obesity. CXR today  Respiratory complications generally occur in 1% of ASA Class I patients, 5% of ASA Class II and 10% of ASA Class III-IV patients These complications rarely result in mortality and include postoperative pneumonia, atelectasis, pulmonary embolism, ARDS and increased time requiring postoperative mechanical ventilation.   Overall, I recommend proceeding with the surgery if the risk for respiratory complications are outweighed by the potential benefits. This will need to be discussed between the patient and surgeon.   To reduce risks of respiratory complications, I recommend: --Pre- and post-operative incentive spirometry performed frequently while awake; aggressive pulm toileting with O2 as needed. DVT prophylaxis  --Continued use of prescribed inhalers and PRN bronchodilators. --Avoiding use of pancuronium during anesthesia and short duration of surgery as much as possible. --OOB, encourage mobility post-op   1) RISK FOR PROLONGED MECHANICAL VENTILAION - > 48h  1A) Arozullah - Prolonged mech ventilation risk Arozullah Postperative Pulmonary Risk Score - for mech ventilation dependence >48h [employer], Ann Surg 2000, major non-cardiac surgery) Comment Score  Type of surgery - abd ao aneurysm (27), thoracic (21), neurosurgery / upper abdominal / vascular (21), neck (11) RTKA 0  Emergency Surgery - (11)  0  ALbumin < 3 or poor nutritional state - (9)  0  BUN > 30 -  (8)  0  Partial or completely dependent functional status - (7)  0  COPD -  (6)  6  Age - 60 to 69 (4), > 70  (6)  0  TOTAL  6  Risk Stratifcation scores  - < 10 (0.5%), 11-19 (1.8%), 20-27 (4.2%), 28-40 (10.1%), >40 (26.6%)  0.5%      Clayton Bibles, NP 08/14/2021  Pt aware and understands NP's role.

## 2021-08-14 NOTE — Assessment & Plan Note (Signed)
Well-controlled. Notify of any flare in symptoms going into allergy season.

## 2021-08-14 NOTE — Patient Instructions (Addendum)
Continue Trelegy 1 puff daily, brush tongue and rinse mouth afterwards -Continue Albuterol inhaler 2 puffs or 3 mL neb every 6 hours as needed for shortness of breath or wheezing. Notify if symptoms persist despite rescue inhaler/neb use. -Continue famotidine (pepcid) 20 mg Twice daily as needed for reflux  Asthma Action Plan in place Rinse mouth after inhaled corticosteroid use.  Avoid triggers, when able.  Exercise encouraged. Notify if worsening symptoms upon exertion.  Notify and seek help if symptoms unrelieved by rescue inhaler.  Chest x ray today for surgical clearance.  Overnight oximetry study to determine if you need to continue your supplemental oxygen at night  Take your inhalers with you when you go in for surgery. Please let us know if you need anything before or after. Ensure you are up and mobile as soon as possible after surgery. Use your incentive spirometer 10x/hr while awake. I hope everything goes well!  Follow up in 6 months with [name] or [name]. If symptoms do not improve or worsen, please contact office for sooner follow up or seek emergency care.

## 2021-08-14 NOTE — Assessment & Plan Note (Signed)
Stable. No recent flares. Switched from Advair to Trelegy at last visit with improvement. Continue Trelegy today and PRN albuterol.   Patient Instructions  Continue Trelegy 1 puff daily, brush tongue and rinse mouth afterwards -Continue Albuterol inhaler 2 puffs or 3 mL neb every 6 hours as needed for shortness of breath or wheezing. Notify if symptoms persist despite rescue inhaler/neb use. -Continue famotidine (pepcid) 20 mg Twice daily as needed for reflux  Asthma Action Plan in place Rinse mouth after inhaled corticosteroid use.  Avoid triggers, when able.  Exercise encouraged. Notify if worsening symptoms upon exertion.  Notify and seek help if symptoms unrelieved by rescue inhaler.  Chest x ray today for surgical clearance.  Overnight oximetry study to determine if you need to continue your supplemental oxygen at night  Take your inhalers with you when you go in for surgery. Please let us know if you need anything before or after. Ensure you are up and mobile as soon as possible after surgery. Use your incentive spirometer 10x/hr while awake. I hope everything goes well!  Follow up in 6 months with Dr. Annamaria Boots or Alanson Aly. If symptoms do not improve or worsen, please contact office for sooner follow up or seek emergency care.

## 2021-08-14 NOTE — Assessment & Plan Note (Signed)
Hx of anaphylaxis r/t bee sting. Epi pen expired. Refill sent today.

## 2021-08-14 NOTE — Assessment & Plan Note (Addendum)
RTKA planned for upcoming months with Dr. Percell Miller. Asthma has been stable with no recent flares. Low to moderate surgical risk. Factors that increase the risk for postoperative pulmonary complications are asthma, nocturnal hypoxia, and obesity. CXR today  Respiratory complications generally occur in 1% of ASA Class I patients, 5% of ASA Class II and 10% of ASA Class III-IV patients These complications rarely result in mortality and include postoperative pneumonia, atelectasis, pulmonary embolism, ARDS and increased time requiring postoperative mechanical ventilation.   Overall, I recommend proceeding with the surgery if the risk for respiratory complications are outweighed by the potential benefits. This will need to be discussed between the patient and surgeon.   To reduce risks of respiratory complications, I recommend: --Pre- and post-operative incentive spirometry performed frequently while awake; aggressive pulm toileting with O2 as needed. DVT prophylaxis  --Continued use of prescribed inhalers and PRN bronchodilators. --Avoiding use of pancuronium during anesthesia and short duration of surgery as much as possible. --OOB, encourage mobility post-op   1) RISK FOR PROLONGED MECHANICAL VENTILAION - > 48h  1A) Arozullah - Prolonged mech ventilation risk Arozullah Postperative Pulmonary Risk Score - for mech ventilation dependence >48h [employer], Ann Surg 2000, major non-cardiac surgery) Comment Score  Type of surgery - abd ao aneurysm (27), thoracic (21), neurosurgery / upper abdominal / vascular (21), neck (11) RTKA 0  Emergency Surgery - (11)  0  ALbumin < 3 or poor nutritional state - (9)  0  BUN > 30 -  (8)  0  Partial or completely dependent functional status - (7)  0  COPD -  (6)  6  Age - 60 to 69 (4), > 70  (6)  0  TOTAL  6  Risk Stratifcation scores  - < 10 (0.5%), 11-19 (1.8%), 20-27 (4.2%), 28-40 (10.1%), >40 (26.6%)  0.5%

## 2021-08-14 NOTE — Telephone Encounter (Signed)
OV notes and clearance form have been faxed back to [facility]. Nothing further needed at this time.  °

## 2021-08-14 NOTE — Telephone Encounter (Signed)
Attempted to call pt but unable to reach. Left message for her to return call. 

## 2021-08-14 NOTE — Assessment & Plan Note (Signed)
Previously ordered to be on 2 lpm at night; although, I can not find a study indicating the necessity. Has not been wearing for a while. Will obtain ONO to ensure she is appropriate to be on room air at night.

## 2021-08-15 NOTE — Progress Notes (Signed)
Clear lungs. No acute disease. Can send to normal results pool, thanks!

## 2021-08-18 NOTE — Addendum Note (Signed)
Addended by: Dessie Coma on: 08/18/2021 12:35 PM   Modules accepted: Orders

## 2021-08-19 ENCOUNTER — Ambulatory Visit (INDEPENDENT_AMBULATORY_CARE_PROVIDER_SITE_OTHER): Payer: Medicare HMO | Admitting: Psychology

## 2021-08-19 ENCOUNTER — Other Ambulatory Visit: Payer: Self-pay

## 2021-08-19 DIAGNOSIS — F332 Major depressive disorder, recurrent severe without psychotic features: Secondary | ICD-10-CM | POA: Diagnosis not present

## 2021-08-19 DIAGNOSIS — F0280 Dementia in other diseases classified elsewhere without behavioral disturbance: Secondary | ICD-10-CM

## 2021-08-19 DIAGNOSIS — F411 Generalized anxiety disorder: Secondary | ICD-10-CM | POA: Diagnosis not present

## 2021-08-19 NOTE — Progress Notes (Signed)
° °  Neuropsychology Feedback Session Tillie Rung. [place] Department of Neurology  Reason for Referral:   [name] is a 61 y.o. right-handed African-American female referred by Metta Clines, D.O., to characterize her current cognitive functioning and assist with diagnostic clarity and treatment planning in the context of mild cognitive impairment, concerns for progressive cognitive deterioration, and a family history of early-onset neurodegenerative illness.   Feedback:   Ms. Demilio completed a comprehensive neuropsychological evaluation on 08/05/2021. Please refer to that encounter for the full report and recommendations. Briefly, results suggested diffuse and quite severe cognitive impairment, with the vast majority of performances scoring in the exceptionally low to well below average normative ranges relative to age-matched peers. A relative strength was seen across basic attention and some variability seen across more complex attention/concentration. However, performances across all other assessed cognitive domains were impaired. This included processing speed, executive functioning, receptive and expressive language, visuospatial abilities, and all aspects of learning and memory.   The etiology of ongoing impairment is unclear, very difficult to determine given diffuse impairment and the lack of strong patterns of strengths and weakness, and likely multifactorial in nature. I have concerns surrounding the presence of Lewy body dementia. Behaviorally, she exhibited tremors in her upper extremities during testing, reported ongoing REM sleep behaviors which have been present and increasing in frequency for the past 2.5 years, and reported newly emerging fully formed hallucinations in the time since her previous evaluation. From a cognitive perspective, Ms. Aramburo greatest areas of impairment surround visuospatial abilities and executive functioning. All of these factors  represent classic symptoms of Lewy body dementia and certainly increase concern. In addition to this, Alzheimer's disease certainly remains a possibility. She was essentially amnestic across memory testing and did not benefit strongly from the provision of cues, suggesting an evolving storage deficit. As Alzheimer's disease progresses, diffuse impairment is seen. She also has a family history of early-onset Alzheimer's disease, making this sort of presentation more likely. Prominent visuospatial impairment would also raise concerns for a more rare posterior cortical atrophy variant of this disease process. It is worth stating that Lewy body dementia and Alzheimer's disease co-occur fairly often and it may be that a combination of these illnesses best represents the cause for ongoing decline.   Ms. Beitz was unaccompanied during the current feedback session. Content of the current session focused on the results of her neuropsychological evaluation. Ms. Gethers was given the opportunity to ask questions and her questions were answered. She was encouraged to reach out should additional questions arise. A copy of her report was provided at the conclusion of the visit.      20 minutes were spent conducting the current feedback session with Ms. Venn, billed as one unit [phone].

## 2021-08-21 ENCOUNTER — Ambulatory Visit
Admission: RE | Admit: 2021-08-21 | Discharge: 2021-08-21 | Disposition: A | Discharge status: Home / Self Care | Payer: Medicare HMO | Source: Ambulatory Visit | Attending: Obstetrics and Gynecology | Admitting: Obstetrics and Gynecology

## 2021-08-21 DIAGNOSIS — E049 Nontoxic goiter, unspecified: Secondary | ICD-10-CM

## 2021-08-26 ENCOUNTER — Telehealth: Payer: Self-pay | Admitting: Family Medicine

## 2021-08-26 ENCOUNTER — Ambulatory Visit: Payer: Medicare HMO | Admitting: Internal Medicine
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
## 2021-08-26 NOTE — Telephone Encounter (Signed)
Error/njr °

## 2021-08-28 ENCOUNTER — Ambulatory Visit: Payer: Self-pay | Admitting: Family Medicine

## 2021-08-28 ENCOUNTER — Ambulatory Visit: Payer: Medicare HMO | Admitting: Neurology

## 2021-09-03 ENCOUNTER — Ambulatory Visit: Payer: Medicare HMO | Admitting: Family Medicine

## 2021-09-08 ENCOUNTER — Ambulatory Visit: Payer: Medicare HMO | Admitting: Family Medicine

## 2021-09-10 ENCOUNTER — Encounter: Payer: Self-pay | Admitting: Family Medicine

## 2021-09-10 ENCOUNTER — Ambulatory Visit (INDEPENDENT_AMBULATORY_CARE_PROVIDER_SITE_OTHER): Payer: Medicare HMO | Admitting: Family Medicine

## 2021-09-10 VITALS — BP 127/79 | HR 68 | Temp 98.1°F | Wt 262.0 lb

## 2021-09-10 DIAGNOSIS — N1831 Chronic kidney disease, stage 3a: Secondary | ICD-10-CM

## 2021-09-10 DIAGNOSIS — Z01818 Encounter for other preprocedural examination: Secondary | ICD-10-CM | POA: Diagnosis not present

## 2021-09-10 NOTE — Progress Notes (Signed)
Chief Complaint  Patient presents with   Follow-up    Nneds EKGfor surgery    HPI:  Patient is seen for optimization of general medical care prior to surgery. Surgery type:  R TKR Date of surgery: TBD  Kidney disease? No Prior surgeries/Issues following anesthesia?  Hx MI, heart arrythmia, CHF, angina or stroke? none Epilepsy or Seizures? H/o seizures Arthritis or problems with neck or jaw? none Thyroid disease? none Liver disease? none Asthma, COPD or chronic lung disease?  History of asthma, chronic respiratory failure with nocturnal hypoxia on 2 L O2 at night Diabetes? none (Needs to be evaluated by anesthesia if yes to these questions.)  Other: Poor nutrition, Frail or other: no  METS:  ?Can take care of self, such as eat, dress, or use the toilet (1 MET). yes ?Can walk up a flight of steps or a hill (4 METs).yes ?Can do heavy work around the house such as scrubbing floors or lifting or moving heavy furniture (between 4 and 10 METs). yes ?Can participate in strenuous sports such as swimming, singles tennis, football, basketball, and skiing (>10 METs) . AHA Risks: Major predictors that require intensive management and may lead to delay in or cancellation of the operative procedure unless emergent: NONE   Unstable coronary syndromes including unstable or severe angina or recent MI   Decompensated heart failure including NYHA functional class IV or worsening or new-onset HF   Significant arrhythmias including high grade AV block, symptomatic ventricular arrhythmias, supraventricular arrhythmias with ventricular rate >100 bpm at rest, symptomatic bradycardia, and newly recognized ventricular tachycardia   Severe heart valve disease including severe aortic stenosis or symptomatic mitral stenosis   Other clinical predictors that warrant careful assessment of current status: NONE   History of ischemic heart disease  History of cerebrovascular disease   History of compensated  heart failure or prior heart failure   Diabetes mellitus   Renal insufficiency  Type of surgery and Risk: 1) High risk (reported risk of cardiac death or nonfatal myocardial infarction [MI] often greater than 5 percent):   Aortic and other major vascular surgery   Peripheral artery surgery   2)Intermediate risk (reported risk of cardiac death or nonfatal MI generally 1 to 5 percent):   Carotid endarterectomy   Head and neck surgery   Intraperitoneal and intrathoracic surgery   Orthopedic surgery   Prostate surgery   3)Low risk (reported risk of cardiac death or nonfatal MI generally less than 1 percent):   Ambulatory surgery   Endoscopic procedures   Superficial procedure   Cataract surgery   Breast surgery  Medications that need to be addressed prior to surgery: None Discontinue acei/arbs/non-statin lipid lowering drugs day of surgery ASA stop 7 days before or discuss with cardiology if CV risks, other anticoagulants discuss with cardiology.   ROS: See pertinent positives and negatives per HPI. 11 point ROS negative except where noted.  Past Medical History:  Diagnosis Date   Abdominal pain 09/27/2009   Allergic rhinitis 09/20/2008   Allergy skin test 10/10/08 Allergy Profile 01/18/2013-total IgE 242.4 with broad elevations for common inhalant allergens.    Arthritis    oa   Asthma, moderate persistent 06/04/2009   Allergy Profile 01/18/2013-total IgE 242.4 with broad elevations for common inhalant allergens. PFT 07/05/08- FVC 3.51/ 88%, FEV1 2.38/ 78%, R 0.68, 25-75% 1.45/ 44%, TLC 83%, DLCO 68% PFT 10/21/20- Moderate obstructive airways disease with slight respnse to bronchodilator. Normal Diffusion. FEV!/FVC 0.75    Backache, unspecified  Bipolar I disorder 06/13/2008   Chronic back pain 06/13/2008   Chronic respiratory failure with hypoxia 05/06/2020   Stable with routine use of O2 2L during sleep. Nocturnal hypoxemia with normal daytime oxygenation on room air.     Class 3 severe obesity due to excess calories with serious comorbidity and body mass index (BMI) of 40.0 to 44.9 in adult 12/19/2018   Closed left ankle fracture    DDD (degenerative disc disease), lumbosacral 10/03/2013   Disorder of sacrum 11/03/2013   Dysphagia, pharyngoesophageal phase 09/12/2008   Elevated LFTs 10/15/2010   Female pelvic peritoneal adhesions 12/19/2018   Fibromyalgia    Galactorrhea not associated with childbirth 12/19/2018   Generalized anxiety disorder 04/05/2012   GERD (gastroesophageal reflux disease) 07/05/2008   Heart disease 12/19/2018   Hiatal hernia 11/03/2013   Hyperglycemia 06/13/2008   Hypertension    Intractable migraine with aura 11/03/2013   Lumbar radiculopathy 08/30/2012   Lumbosacral spondylosis 11/03/2013   Major depressive disorder 09/08/2019   Major neurocognitive disorder due to multiple etiologies 08/05/2021   Mixed hyperlipidemia 10/04/2019   On home oxygen therapy    nightly at 2l/m prn   Open left ankle fracture 07/16/2020   Oropharyngeal dysphagia 03/01/2018   OSA (obstructive sleep apnea) 06/13/2008   Pain in pelvis 12/19/2018   PONV (postoperative nausea and vomiting)    Schizoaffective disorder, bipolar type 10/04/2019   Seizure disorder    last seizure 2014   Status post open reduction with internal fixation (ORIF) of fracture of ankle 08/26/2020   L ankle   Thyroid disease 10/03/2013   Vitamin D deficiency 10/04/2019   Vocal cord disorder 04/02/2009   Weakness 09/11/2016    Past Surgical History:  Procedure Laterality Date   APPENDECTOMY  1988   laparoscopic   BREAST LUMPECTOMY WITH NEEDLE LOCALIZATION Left 06/06/2015   Procedure: LEFT BREAST LUMPECTOMY WITH NEEDLE LOCALIZATION TIMES TWO;  Surgeon: Erroll Luna, MD;  Location: [facility];  Service: General;  Laterality: Left;   [place]   twins   CHOLECYSTECTOMY  yrs ago   COLONOSCOPY N/A 06/12/2013   Procedure: COLONOSCOPY;  Surgeon:  Irene Shipper, MD;  Location: [facility];  Service: Endoscopy;  Laterality: N/A;   HERNIA REPAIR  yrs ago   umbilical   ORIF ANKLE FRACTURE Left 07/16/2020   Procedure: OPEN REDUCTION INTERNAL FIXATION (ORIF) ANKLE FRACTURE;  Surgeon: Renette Butters, MD;  Location: [facility];  Service: Orthopedics;  Laterality: Left;   TUBAL LIGATION  1988   VAGINAL HYSTERECTOMY  2005   complete    Family History  Problem Relation Age of Onset   Heart disease Father    Ovarian cancer Mother    Breast cancer Mother    Alzheimer's disease Sister 94   Colon cancer Neg Hx    Esophageal cancer Neg Hx    Stomach cancer Neg Hx    Rectal cancer Neg Hx     Social History   Socioeconomic History   Marital status: Divorced    Spouse name: Not on file   Number of children: 3   Years of education: 14   Highest education level: Some college, no degree  Occupational History   Occupation: Disabled  Tobacco Use   Smoking status: Former    Packs/day: 1.00    Years: 5.00    Pack years: 5.00    Types: Cigarettes    Quit date: 07/07/2007    Years since quitting: 38.1  Smokeless tobacco: Never  Vaping Use   Vaping Use: Never used  Substance and Sexual Activity   Alcohol use: No   Drug use: No   Sexual activity: Not Currently    Birth control/protection: Surgical  Other Topics Concern   Not on file  Social History Narrative   Single, 3 children   Right handed   Associates degree   occ 1 cup daily      09/01/2018: Lives with sister, Rise Paganini, who helps manage medications, in 2 story house, and a pit bull   Has one daughter in [place], 2 sons in [place]; 7 grandchildren   Currently not exercising, but wants to return to water aerobics at [employer] [facility]   Financial Resource Strain: Medium Risk   Difficulty of Paying Living Expenses: Somewhat hard  Food Insecurity: No Food Insecurity   Worried About Charity fundraiser in the Last Year: Never true   Arboriculturist  in the Last Year: Never true  Transportation Needs: No Transportation Needs   Lack of Transportation (Medical): No   Lack of Transportation (Non-Medical): No  Physical Activity: Insufficiently Active   Days of Exercise per Week: 4 days   Minutes of Exercise per Session: 20 min  Stress: No Stress Concern Present   Feeling of Stress : Only a little  Social Connections: Moderately Isolated   Frequency of Communication with Friends and Family: Three times a week   Frequency of Social Gatherings with Friends and Family: Three times a week   Attends Religious Services: More than 4 times per year   Active Member of Clubs or Organizations: No   Attends Archivist Meetings: Never   Marital Status: Divorced     Current Outpatient Medications:    acetaminophen (TYLENOL) 500 MG tablet, Take 1,000 mg by mouth every 6 (six) hours as needed for moderate pain., Disp: , Rfl:    albuterol (PROVENTIL) (2.5 MG/3ML) 0.083% nebulizer solution, Take 3 mLs by nebulization every 4 (four) hours as needed for wheezing or shortness of breath., Disp: 75 mL, Rfl: 12   aspirin EC 81 MG EC tablet, Take 1 tablet (81 mg total) by mouth 2 (two) times daily. Swallow whole., Disp: 30 tablet, Rfl: 11   benztropine (COGENTIN) 0.5 MG tablet, Take 0.5 mg by mouth daily. , Disp: , Rfl:    EPINEPHrine 0.3 mg/0.3 mL IJ SOAJ injection, Inject 0.3 mg into the muscle daily as needed for anaphylaxis., Disp: 1 each, Rfl: 3   famotidine (PEPCID) 20 MG tablet, Take 1 tablet (20 mg total) by mouth 2 (two) times daily. For stomach acid. (Patient taking differently: Take 20 mg by mouth daily.), Disp: 60 tablet, Rfl: 0   FLUoxetine (PROZAC) 40 MG capsule, Take 80 mg by mouth daily., Disp: , Rfl:    Fluticasone-Umeclidin-Vilant (TRELEGY ELLIPTA) 100-62.5-25 MCG/ACT AEPB, Inhale 1 puff into the lungs daily., Disp: 28 each, Rfl: 3   HYDROcodone-acetaminophen (NORCO/VICODIN) 5-325 MG tablet, Take 1-2 tablets by mouth every 4 (four)  hours as needed for moderate pain (pain score 4-6)., Disp: 30 tablet, Rfl: 0   hydrOXYzine (ATARAX/VISTARIL) 25 MG tablet, Take 25 mg by mouth 4 (four) times daily as needed for anxiety or itching., Disp: , Rfl: 2   lamoTRIgine (LAMICTAL) 200 MG tablet, Take 200 mg by mouth at bedtime., Disp: , Rfl:    metoprolol succinate (TOPROL-XL) 50 MG 24 hr tablet, Take with or immediately following a meal., Disp: 90 tablet, Rfl:  1   mirtazapine (REMERON) 15 MG tablet, Take 15 mg by mouth at bedtime., Disp: , Rfl:    Multiple Vitamin (MULTIVITAMIN WITH MINERALS) TABS, Take 1 tablet by mouth daily. For nutritional supplementation. (Patient taking differently: Take 1 tablet by mouth daily.), Disp: 30 tablet, Rfl: 0   ondansetron (ZOFRAN) 4 MG tablet, Take 1 tablet (4 mg total) by mouth every 6 (six) hours as needed for nausea., Disp: 20 tablet, Rfl: 0   polyethylene glycol powder (GLYCOLAX/MIRALAX) 17 GM/SCOOP powder, Take 17 g by mouth daily as needed., Disp: 1350 g, Rfl: 3   potassium chloride SA (KLOR-CON M) 20 MEQ tablet, Take 1 tablet (20 mEq total) by mouth daily., Disp: 3 tablet, Rfl: 0   tiZANidine (ZANAFLEX) 4 MG tablet, Take 4 mg by mouth 3 (three) times daily as needed for muscle spasms., Disp: , Rfl:    VITAMIN D PO, Take 1 capsule by mouth daily. , Disp: , Rfl:    Vitamin D, Ergocalciferol, (DRISDOL) 1.25 MG (50000 UNIT) CAPS capsule, Take 1 capsule (50,000 Units total) by mouth every 7 (seven) days., Disp: 12 capsule, Rfl: 0   ziprasidone (GEODON) 20 MG capsule, Take 20 mg by mouth 2 (two) times daily with a meal., Disp: , Rfl:   EXAM:  Vitals:   09/10/21 1043  BP: 127/79  Pulse: 68  Temp: 98.1 F (36.7 C)  SpO2: 100%    Body mass index is 38.69 kg/m.  GENERAL: vitals reviewed and listed above, alert, oriented, appears well hydrated and in no acute distress  HEENT: atraumatic, conjunttiva clear, no obvious abnormalities on inspection of external nose and ears  NECK: no obvious masses  on inspection, no carotid bruits  LUNGS: clear to auscultation bilaterally, no wheezes, rales or rhonchi, good air movement  CV: HRRR, no peripheral edema, no JVD, BP normal range, normal radial pulses  MS: moves all extremities without noticeable abnormality  PSYCH: pleasant and cooperative, no obvious depression or anxiety  ASSESSMENT AND PLAN:  Discussed the following assessment and plan:  Preop examination -Right TKR planned -EKG done this visit similar to previous study on 07/10/2020.  NSR with nonspecific T wave abnormality in lateral leads. -Form completed and sent to Ortho office.  Stage 3a chronic kidney disease (HCC) -Stable -eGFR 55 and creatinine 1.1 on 08/04/2021 -Discussed increasing water intake, limiting NSAID use and other medications that can worsen renal function. -Given handout  Morbid obesity ([place]) -[facility] -[facility]  Assessment: -Risk factors: none -Surgery Risks:intermediate -age, nutritional status, fraility: good nutritional status, age<65, no fraility -functional capacity: > 4 METs without symptoms Patient Specific Risks: patient is low risk for intermediate risks surgery  Recommendations for optimizing general medical care prior to surgery: -advised patient to discuss specific risks morbidity and mortality of surgery with surgeon, CV risks discussed with patient -advised patient will defer to surgeon for post-op DVT prophylaxis and post op care -no specific medical recommendations for this patient at this time and no recommendations to defer surgery or for further CV testing prior to surgery -form for pre-op optimization of general medical care prior to surgery faxed to surgeon office  -Patient advised to return or notify a doctor immediately if symptoms worsen or persist or new concerns arise.   Billie Ruddy

## 2021-09-12 DIAGNOSIS — E042 Nontoxic multinodular goiter: Secondary | ICD-10-CM | POA: Diagnosis not present

## 2021-09-17 ENCOUNTER — Other Ambulatory Visit: Payer: Self-pay | Admitting: Obstetrics and Gynecology

## 2021-09-17 ENCOUNTER — Telehealth: Payer: Self-pay

## 2021-09-17 ENCOUNTER — Telehealth: Payer: Medicare HMO

## 2021-09-17 DIAGNOSIS — E041 Nontoxic single thyroid nodule: Secondary | ICD-10-CM

## 2021-09-17 NOTE — Telephone Encounter (Signed)
Contacted patient on preferred number listed in notes for scheduled AWV. Patient stated she would prefer to reschedule AWV due to several other scheduled appointments. ?

## 2021-09-18 ENCOUNTER — Encounter: Payer: Self-pay | Admitting: Family Medicine

## 2021-09-18 ENCOUNTER — Ambulatory Visit (INDEPENDENT_AMBULATORY_CARE_PROVIDER_SITE_OTHER): Payer: Medicare HMO | Admitting: Family Medicine

## 2021-09-18 VITALS — BP 110/69 | HR 68 | Temp 98.6°F | Wt 260.4 lb

## 2021-09-18 DIAGNOSIS — M545 Low back pain, unspecified: Secondary | ICD-10-CM | POA: Diagnosis not present

## 2021-09-18 DIAGNOSIS — Z7189 Other specified counseling: Secondary | ICD-10-CM

## 2021-09-18 DIAGNOSIS — F0280 Dementia in other diseases classified elsewhere without behavioral disturbance: Secondary | ICD-10-CM

## 2021-09-18 DIAGNOSIS — G8929 Other chronic pain: Secondary | ICD-10-CM

## 2021-09-18 NOTE — Progress Notes (Signed)
Subjective:  ? ? Patient ID: [name], female    DOB: [birth year], 61 y.o.   MRN: [id number] ? ?Chief Complaint  ?Patient presents with  ? wheelchair  ?  Discuss a power wheelcahir  ? ? ?HPI ?Patient was seen today for f/u.  Pt inquires about obtaining a power wheelchair.  Currently using rollator.  Endorses h/o spinal stenosis causing low back pain with increased walking such as in the grocery store or at certain doctors visits.  Patient has a R TKR scheduled 09/24/2021 with Ortho.   ? ?Patient states she was recently diagnosed with early stage Alzheimer's dementia.  Has a neurology in a few months.  Pt's parents and her eldest sister have h/o Alzheimer's.  Patient considering moving to [place] with her daughter.  Living with her sister, but endorses feeling guilty that others are having to care for her.  Patient had a discussion with her daughter regarding her wishes/setting up HCPOA and  POA.  Pt states she would want chest compressions, breathing tube, etc, but if scans showed no brain activity she would not want extra measures to keep her alive. ? ?Pt mentions being nervous about having a biopsy of a thyroid nodule. ? ?Past Medical History:  ?Diagnosis Date  ? Abdominal pain 09/27/2009  ? Allergic rhinitis 09/20/2008  ? Allergy skin test 10/10/08 Allergy Profile 01/18/2013-total IgE 242.4 with broad elevations for common inhalant allergens.   ? Arthritis   ? oa  ? Asthma, moderate persistent 06/04/2009  ? Allergy Profile 01/18/2013-total IgE 242.4 with broad elevations for common inhalant allergens. PFT 07/05/08- FVC 3.51/ 88%, FEV1 2.38/ 78%, R 0.68, 25-75% 1.45/ 44%, TLC 83%, DLCO 68% PFT 10/21/20- Moderate obstructive airways disease with slight respnse to bronchodilator. Normal Diffusion. FEV!/FVC 0.75   ? Backache, unspecified   ? Bipolar I disorder 06/13/2008  ? Chronic back pain 06/13/2008  ? Chronic respiratory failure with hypoxia 05/06/2020  ? Stable with routine use of O2 2L during sleep.  Nocturnal hypoxemia with normal daytime oxygenation on room air.   ? Class 3 severe obesity due to excess calories with serious comorbidity and body mass index (BMI) of 40.0 to 44.9 in adult 12/19/2018  ? Closed left ankle fracture   ? DDD (degenerative disc disease), lumbosacral 10/03/2013  ? Disorder of sacrum 11/03/2013  ? Dysphagia, pharyngoesophageal phase 09/12/2008  ? Elevated LFTs 10/15/2010  ? Female pelvic peritoneal adhesions 12/19/2018  ? Fibromyalgia   ? Galactorrhea not associated with childbirth 12/19/2018  ? Generalized anxiety disorder 04/05/2012  ? GERD (gastroesophageal reflux disease) 07/05/2008  ? Heart disease 12/19/2018  ? Hiatal hernia 11/03/2013  ? Hyperglycemia 06/13/2008  ? Hypertension   ? Intractable migraine with aura 11/03/2013  ? Lumbar radiculopathy 08/30/2012  ? Lumbosacral spondylosis 11/03/2013  ? Major depressive disorder 09/08/2019  ? Major neurocognitive disorder due to multiple etiologies 08/05/2021  ? Mixed hyperlipidemia 10/04/2019  ? On home oxygen therapy   ? nightly at 2l/m prn  ? Open left ankle fracture 07/16/2020  ? Oropharyngeal dysphagia 03/01/2018  ? OSA (obstructive sleep apnea) 06/13/2008  ? Pain in pelvis 12/19/2018  ? PONV (postoperative nausea and vomiting)   ? Schizoaffective disorder, bipolar type 10/04/2019  ? Seizure disorder   ? last seizure 2014  ? Status post open reduction with internal fixation (ORIF) of fracture of ankle 08/26/2020  ? L ankle  ? Thyroid disease 10/03/2013  ? Vitamin D deficiency 10/04/2019  ? Vocal cord disorder 04/02/2009  ? Weakness 09/11/2016  ? ? ?  Allergies  ?Allergen Reactions  ? Bee Venom Anaphylaxis  ? Depakote [Divalproex Sodium] Shortness Of Breath and Rash  ? Haloperidol Lactate Swelling  ? Hornet Venom Anaphylaxis  ? Latex Anaphylaxis  ?  mild rash/itching, wheezing/sob  ? Peanut-Containing Drug Products Anaphylaxis  ? Penicillins Anaphylaxis and Swelling  ? Quetiapine Anaphylaxis and Other (See Comments)  ?  Shellfish-Derived Products Anaphylaxis  ? Valproic Acid Swelling  ? Amitriptyline Hcl Other (See Comments)  ?  HALLUCINATIONS  ? Butorphanol Tartrate Other (See Comments)  ?  Hallucinations  ? Gabapentin Other (See Comments)  ?  Pt reports severe back and side pain  ? Iodine Other (See Comments)  ?  Flushing and fainting  ? Pregabalin Rash  ? Terbutaline Rash  ? ? ?ROS ?General: Denies fever, chills, night sweats, changes in weight, changes in appetite + decreased memory ?HEENT: Denies headaches, ear pain, changes in vision, rhinorrhea, sore throat ?CV: Denies CP, palpitations, SOB, orthopnea ?Pulm: Denies SOB, cough, wheezing ?GI: Denies abdominal pain, nausea, vomiting, diarrhea, constipation ?GU: Denies dysuria, hematuria, frequency, vaginal discharge ?Msk: Denies muscle cramps, joint pains + back pain, right knee pain ?Neuro: Denies weakness, numbness, tingling ?Skin: Denies rashes, bruising ?Psych: Denies depression, anxiety, hallucinations ? ?   ?Objective:  ?  ?Blood pressure 110/69, pulse 68, temperature 98.6 ?F (37 ?C), temperature source Oral, weight 260 lb 6.4 oz (118.1 kg), SpO2 100 %. ? ?Gen. Pleasant, well-nourished, in no distress, normal affect   ?HEENT: [place]/AT, face symmetric, conjunctiva clear, no scleral icterus, PERRLA, EOMI, nares patent without drainage, ?Lungs: no accessory muscle use ?Cardiovascular: RRR, no peripheral edema ?Musculoskeletal: No deformities, no cyanosis or clubbing, normal tone ?Neuro:  A&Ox3, CN II-XII intact, ambulating with rollator ?Skin:  Warm, no lesions/ rash ? ? ?Wt Readings from Last 3 Encounters:  ?09/18/21 260 lb 6.4 oz (118.1 kg)  ?09/10/21 262 lb (118.8 kg)  ?08/14/21 260 lb (117.9 kg)  ? ? ?Lab Results  ?Component Value Date  ? WBC 6.1 08/04/2021  ? HGB 13.2 08/04/2021  ? HCT 40.7 08/04/2021  ? PLT 330.0 08/04/2021  ? GLUCOSE 112 (H) 08/04/2021  ? CHOL 242 (H) 08/04/2021  ? TRIG 102.0 08/04/2021  ? HDL 67.90 08/04/2021  ? LDLCALC 153 (H) 08/04/2021  ? ALT 17  08/04/2021  ? AST 21 08/04/2021  ? NA 138 08/04/2021  ? K 3.4 (L) 08/04/2021  ? CL 99 08/04/2021  ? CREATININE 1.10 08/04/2021  ? BUN 13 08/04/2021  ? CO2 26 08/04/2021  ? TSH 0.54 08/04/2021  ? INR 0.99 12/25/2011  ? HGBA1C 6.0 08/04/2021  ? ? ?Assessment/Plan: ? ?Major neurocognitive disorder due to multiple etiologies ?-Etiology of ongoing impairment difficult to determine given diffuse impairment and the lack of strong patterns of strengths and weaknesses per neuropsych testing done 08/19/2021 ?-Early onset Alzheimer's dementia possible 2/2 family history, but also concerns for Lewy body dementia  ?-Advanced directive counseling done this visit ?-Discussed possible medications to help slow progression of dementia ?-Patient encouraged to keep follow-up with neurology ? ?Chronic low back pain without sciatica, unspecified back pain laterality ?-MR lumbar spine without contrast 01/15/2020 with multilevel spondylosis ?-CT lumbar spine with contrast 04/12/2020 with facet osteoarthritis of the right more than the left at L3-4 to L5-S1, mild disc bulging also noted at these levels.  No neural compression. ?-Patient encouraged to stay mobile as long as possible.  Having increased pain with walking unsure power wheelchair would be best right now given p's upcoming R TKR scheduled in  2 weeks.  Evaluation for need of power wheelchair after TKR rehab advised.  Will send to PM&R at that time for evaluation. ?-Continue current treatment of symptoms ?-Continue follow-up with Ortho ? ?Advanced directives, counseling/discussion ?-Patient given advanced directive packet to review with family ? ?F/u in 2-3 months, sooner if needed ? ?Grier Mitts, MD ?

## 2021-09-23 ENCOUNTER — Ambulatory Visit: Payer: Medicare HMO | Admitting: Internal Medicine

## 2021-09-24 ENCOUNTER — Telehealth: Payer: Self-pay | Admitting: Nurse Practitioner

## 2021-09-25 MED ORDER — ALBUTEROL SULFATE HFA 108 (90 BASE) MCG/ACT IN AERS: 2.0000 | INHALATION_SPRAY | Freq: Four times a day (QID) | RESPIRATORY_TRACT | 5 refills | Status: DC | PRN

## 2021-09-25 NOTE — Telephone Encounter (Signed)
Called and spoke with patient. She stated that she needed a refill on her albuterol to be sent to [facility] on [address]. I advised her that I would go ahead and send in the refill for her. She verbalized understanding.  ? ?Nothing further needed at time of call.  ?

## 2021-09-30 DIAGNOSIS — F250 Schizoaffective disorder, bipolar type: Secondary | ICD-10-CM | POA: Diagnosis not present

## 2021-10-06 ENCOUNTER — Encounter: Payer: Self-pay | Admitting: Family Medicine

## 2021-10-06 ENCOUNTER — Telehealth (INDEPENDENT_AMBULATORY_CARE_PROVIDER_SITE_OTHER): Payer: Medicare HMO | Admitting: Family Medicine

## 2021-10-06 DIAGNOSIS — M2550 Pain in unspecified joint: Secondary | ICD-10-CM | POA: Diagnosis not present

## 2021-10-06 DIAGNOSIS — E041 Nontoxic single thyroid nodule: Secondary | ICD-10-CM | POA: Diagnosis not present

## 2021-10-06 DIAGNOSIS — F0280 Dementia in other diseases classified elsewhere without behavioral disturbance: Secondary | ICD-10-CM | POA: Diagnosis not present

## 2021-10-06 NOTE — Progress Notes (Signed)
Virtual Visit via Telephone Note ? ?I connected with [name] on 10/06/21 at 10:00 AM EDT by telephone and verified that I am speaking with the correct person using two identifiers. ?  ?I discussed the limitations, risks, security and privacy concerns of performing an evaluation and management service by telephone and the availability of in person appointments. I also discussed with the patient that there may be a patient responsible charge related to this service. The patient expressed understanding and agreed to proceed. ? ?Location patient: home ?Location provider: work or home office ?Participants present for the call: patient, provider ?Patient did not have a visit in the prior 7 days to address this/these issue(s). ? ?Chief Complaint  ?Patient presents with  ? Follow-up  ?  Motorized scooter  ? ? ?History of Present Illness: ?Pt is a 61 yo with pmh sig for HTN, CAD, allergies, asthma, chronic resp failure with hypoxia, OSA, GERD, lumbar radiculopathy, seizure d/o, thyroid dz, major neurocognitive d/o, DDD, lumbosacral spondylosis, bipolar 1 d/o, anxiety, fibromyalgia, obesity.  Seen for f/u. ? ?Pt states R TKR surgery was rescheduled.  Unsure of exact date of surgery planned.   ? ?Several thyroid nodules noted on U/S at OB/Gyn office.  Pt has an appt for bxp of one of the nodules coming up. ? ?Still having joint pain. States OTC med were working but not right now.  Pt's Lshoulder, L elbow, L ankle and L wrist are worse at night.  Pt endorses intermittent paresthesias in L hand.   ?  ?Observations/Objective: ?Patient sounds cheerful and well on the phone. ?I do not appreciate any SOB. ?Speech and thought processing are grossly intact. ?Patient reported vitals: ? ?Assessment and Plan: ?Thyroid nodule ?-several thyroid nodules noted on ultrasound thyroid from 08/21/2021 ?-FNA biopsy of thyroid nodule in L lobe 4.1 x 2.4 x 2.2 cm solid hypoechoic on u/s scheduled 10/20/21. ?-continue f/u with  Endo ? ?Major neurocognitive disorder due to multiple etiologies ?-continue f/u with Neruology ? ?Arthralgia of multiple joints ?-continue treatment of symptoms with heat, stretching, topical analgesics, massage, OTC meds ?-f/u with Ortho and rheumatology prn ? ?Follow Up Instructions: ? F/u prn ? ?99441 5-10 ?99442 11-20 ?9443 21-30 ?I did not refer this patient for an OV in the next 24 hours for this/these issue(s). ? ?I discussed the assessment and treatment plan with the patient. The patient was provided an opportunity to ask questions and all were answered. The patient agreed with the plan and demonstrated an understanding of the instructions. ?  ?The patient was advised to call back or seek an in-person evaluation if the symptoms worsen or if the condition fails to improve as anticipated. ? ?I provided 6:26 minutes of non-face-to-face time during this encounter. ? ? ?Billie Ruddy, MD ? ?

## 2021-10-08 ENCOUNTER — Ambulatory Visit: Payer: Self-pay | Admitting: Family Medicine

## 2021-10-13 NOTE — Progress Notes (Signed)
? ?NEUROLOGY FOLLOW UP OFFICE NOTE ? ?[name] ?[id number] ? ?Assessment/Plan:  ? ?Major neurocognitive disorder, unclear etiology but likely multifactorial.  She has cerebrovascular disease and psychiatric comorbidities but there are concern for LBD and Alzheimer's which she could have both. ?Seizure disorder, stable.  EEG normal.  Off AED. ?Migraine without aura, without status migrainosus, not intractable ?White matter lesions on brain MRI.  Prior diagnosis of MS.  Never was on DMT.  My assessment is that findings are chronic small vessel ischemic changes and not demyelinating disease ? ?  ?Check serum CBC, CMP, RPR, ammonia, ESR, CRP, ANA with reflex, HIV. ?Order LP for CSF analysis:  Cell count, protein, glucose, gram stain and culture, fungal culture, CMV, cryptococcal antigen, ANA, cytology, HSV PCR, VDRL, IgG, oligoclonal bands and [facility] labs (ADEVL Alzheimer's panel and DMC2 autoimmune panel and 14-3-3) ?Repeat MRI of brain with and without contrast (will need to be performed prior to LP. ?Check DAT scan ?Pending results, will consider referral to the dementia clinic at [facility]. ?Follow up after testing ?  ?  ?Subjective:  ?[name] is a 61 year old right-handed woman with seizure disorder, questionable multiple sclerosis, fibromyalgia, Bipolar disorder, depression and OSA who follows up for seizure disorder, migraines and MCI. ?  ?UPDATE: ?Due to worsening memory deficits, paraphasic errors and visual hallucinations, she underwent repeat neuropsychological evaluation on 08/05/2021.  Results discussed with Dr. Melvyn Novas.  She demonstrated significant cognitive decline compared to prior evaluation with diffuse and severe cognitive impairment meeting criteria for major neurocognitive disorder.  Etiology unclear and likely multifactorial but there is concern for Lewy Body Dementia (formed hallucinations, REM sleep behavior disorder, visual spatial and executive dysfunction) as well as possible  Alzheimer's disease (amnestic across memory testing) ?  ?Lives with sister who drives her to appointments and helps with her daily activities. ?  ?HISTORY: ?I  SEIZURE DISORDER: ?She has had history of seizures since childhood.  Previously took phenobarbital until age 63.  She was seizure-free for years until December 2014, in which she had 2 suspected seizures followed by a third seizure in February 2015.  She was noted by her sister to be unresponsive, shaking with eyes rolled back with tongue biting lasting 3 minutes.  No bowel or bladder incontinence.  Afterwards, she was confused and agitated.  She was started on Keppra 544m twice daily.  No recurrent seizures since 2015.  Keppra was subsequently discontinued after several years.  She had a repeat MRI of brain on 01/20/2019 which demonstrated mild generalized atrophy and again mild nonspecific cerebral white matter disease, similar to prior MRI from 10/18/2013. Awake and asleep EEG from 05/05/2019 was normal.  Therefore, Keppra was not restarted. ?  ?II QUESTIONABLE HISTORY OF MULTIPLE SCLEROSIS, UNLIKELY.  MRI BRAIN FINDINGS MORE CONSISTENT WITH SMALL VESSEL DISEASE: ? [place], [place]steady [place] and difficulty with fine motor skills.  She took Betaseron until 2005.  MRI of brain with and without contrast from 11/03/04 reportedly demonstrated "no lesion indentified in the brain which would be characteristic for multiple sclerosis."  MRI of brain with and without contrast from 01/29/11 was personally reviewed and demonstrated mild non-enhancing nonspecific punctate T2 hyperintensities in the subcortical white matter, mildly increased on repeat study on 10/18/13.  [facility] weakness with LLQ abdominal pain and nausea.  MRI of cervical, thoracic and lumbar spine were personally reviewed  and demonstrated no demyelinating spinal cord  disease with mild disc bulging and facet hypertrophy at L3-4, L4-5 and L5-S1, but no significant spinal stenosis.  HIV testing was negative. ?  ?III MEMORY DEFICITS: ?She started having short-term memory problems in December 2019, however I presume symptoms started earlier.  At the time, her family noticed the problem.  She will lose her train of thought mid-sentence or she will forget why she walked into a room.  She is told that she keeps repeating questions.  She lives with her sister.  Her sister took away her car keys because she got confused driving on a familiar route.  One time, she gave a cab driver an extra $92.  She is now having trouble paying her bills and requires help from her sister.  She reports needing help bathing and getting dress.  She started putting on clothes backwards.  Her daughter got married in Spring 2019 and she cannot remember the wedding.  She reports hallucinations.  She woke up from a dream and turned over to briefly find a baby in her bed.  She underwent neuropsychological testing at [facility] on 11/23/18.  She did demonstrated reduced effort but no clear evidence of Alzheimer's disease or impairment often associated with MS.  Likely subjective memory deficits related to psychogenic factors but ultimately unclear.  She underwent repeat neuropsychological testing on 09/07/2019 demonstrated diffuse mild neurocognitive disorder of unclear etiology, possibly due to psychiatric factors as she demonstrated severe anxiety and moderate depression, but not definite.  She appeared to exhibit visual deficits on exam, skipping many items unintentionally and often circled responses of the bottom half of one answer and the top half of another answer.  In 2022, she reports slight worsening in memory.  She reports that sometimes she doesn't recognize close family members.  She may not remember being in houses that she has been to in the past.  She lives with two of her sisters.  Her sister and  daughter manage her bills and finances.  She sometimes sees people in the house who aren't there.  Sometimes, she makes paraphasic errors.  She may call a tree by a different name.  She does not drive.  ?  ?Labs from 07/20/18 include B12 479, folate 17.8, TSH 0.43.  CBC and CMP were unremarkable. ?  ?Both of her parents had Alzheimer's disease (in their 24s) and her sister was diagnosed in her early 57s.. ?  ?IV CHRONIC LOW-BACK PAIN: ?She has chronic right sided low back pain radiating down the right leg.  MRI of lumbar spine in 2018 demonstrated mild disc bulging and facet hypertrophy at L3-4, L4-5 and L5-S1.  She was evaluated by orthopedics at [facility] and underwent conservative management.  Repeat MRI lumbar spine from 01/15/2020 showed interval progression of multilevel spondylosis with central L5-S1 disc protrusion abutting left greater than right S1 nerve roots and left extraforaminal L4-5 annular fissuring.  She is undergoing injections. ?             ?V HEADACHES: ?She has history of migraines but headaches started getting worse in September 2019.  She reports severe frontal or temporal pressure pain.  No preceding aura.  There is associated nausea, photophobia but no vomiting, phonophobia, visual disturbance or unilateral numbness or weakness.  They usually last 3 to 4 hours.  They are infrequent and she hasn't had one in several months.  She usually treats it with Motrin and Flexeril. ?  ?Current NSAIDs:  Motrin ?Current  analgesics:  none ?Current muscle relaxant:  Flexeril ?Current antihypertensive:  metoprolol ?Current antidepressant:  Doxepin 41m at bedtime, Prozac ?Current antiepileptic:  Lamotrigine 216mat bedtime ?Current sleep aid:  Trazodone ?Other medications:  Trazodone, Stelazine, Cogentin ?  ?Past antiemetic:  Reglan ?Past muscle relaxant:  Robaxin ?Past antidepressant:  Cymbalta, amitriptyline ?Past antiepileptic:  topiramate 1004mwice daily, gabapentin ?Other past medications:   Latuda, Seroquel, Sonata, Ambien ? ?PAST MEDICAL HISTORY: ?Past Medical History:  ?Diagnosis Date  ? Abdominal pain 09/27/2009  ? Allergic rhinitis 09/20/2008  ? Allergy skin test 10/10/08 Allergy Profile 01/18/2013-total Ig

## 2021-10-14 ENCOUNTER — Other Ambulatory Visit (INDEPENDENT_AMBULATORY_CARE_PROVIDER_SITE_OTHER): Payer: Medicare HMO

## 2021-10-14 ENCOUNTER — Encounter: Payer: Self-pay | Admitting: Neurology

## 2021-10-14 ENCOUNTER — Ambulatory Visit (INDEPENDENT_AMBULATORY_CARE_PROVIDER_SITE_OTHER): Payer: Medicare HMO | Admitting: Neurology

## 2021-10-14 VITALS — BP 132/84 | HR 78 | Ht 69.0 in | Wt 266.4 lb

## 2021-10-14 DIAGNOSIS — F0280 Dementia in other diseases classified elsewhere without behavioral disturbance: Secondary | ICD-10-CM | POA: Diagnosis not present

## 2021-10-14 DIAGNOSIS — R443 Hallucinations, unspecified: Secondary | ICD-10-CM | POA: Diagnosis not present

## 2021-10-14 DIAGNOSIS — R413 Other amnesia: Secondary | ICD-10-CM | POA: Diagnosis not present

## 2021-10-14 DIAGNOSIS — R9082 White matter disease, unspecified: Secondary | ICD-10-CM | POA: Diagnosis not present

## 2021-10-14 DIAGNOSIS — Z820 Family history of epilepsy and other diseases of the nervous system: Secondary | ICD-10-CM | POA: Diagnosis not present

## 2021-10-14 DIAGNOSIS — M2550 Pain in unspecified joint: Secondary | ICD-10-CM | POA: Diagnosis not present

## 2021-10-14 DIAGNOSIS — R251 Tremor, unspecified: Secondary | ICD-10-CM

## 2021-10-14 DIAGNOSIS — R7301 Impaired fasting glucose: Secondary | ICD-10-CM | POA: Diagnosis not present

## 2021-10-14 DIAGNOSIS — E049 Nontoxic goiter, unspecified: Secondary | ICD-10-CM | POA: Diagnosis not present

## 2021-10-14 DIAGNOSIS — Z808 Family history of malignant neoplasm of other organs or systems: Secondary | ICD-10-CM | POA: Diagnosis not present

## 2021-10-14 LAB — CBC
HCT: 39.4 % (ref 36.0–46.0)
Hemoglobin: 12.9 g/dL (ref 12.0–15.0)
MCHC: 32.7 g/dL (ref 30.0–36.0)
MCV: 82.3 fl (ref 78.0–100.0)
Platelets: 282 10*3/uL (ref 150.0–400.0)
RBC: 4.78 Mil/uL (ref 3.87–5.11)
RDW: 15.6 % — ABNORMAL HIGH (ref 11.5–15.5)
WBC: 4.4 10*3/uL (ref 4.0–10.5)

## 2021-10-14 LAB — C-REACTIVE PROTEIN: CRP: 1.3 mg/dL (ref 0.5–20.0)

## 2021-10-14 LAB — COMPREHENSIVE METABOLIC PANEL
ALT: 25 U/L (ref 0–35)
AST: 22 U/L (ref 0–37)
Albumin: 4.1 g/dL (ref 3.5–5.2)
Alkaline Phosphatase: 60 U/L (ref 39–117)
BUN: 11 mg/dL (ref 6–23)
CO2: 26 mEq/L (ref 19–32)
Calcium: 9.4 mg/dL (ref 8.4–10.5)
Chloride: 100 mEq/L (ref 96–112)
Creatinine, Ser: 0.97 mg/dL (ref 0.40–1.20)
GFR: 63.91 mL/min (ref >60.00)
Glucose, Bld: 121 mg/dL — ABNORMAL HIGH (ref 70–99)
Potassium: 4.1 mEq/L (ref 3.5–5.1)
Sodium: 137 mEq/L (ref 135–145)
Total Bilirubin: 0.3 mg/dL (ref 0.2–1.2)
Total Protein: 7.1 g/dL (ref 6.0–8.3)

## 2021-10-14 LAB — SEDIMENTATION RATE: Sed Rate: 70 mm/hr — ABNORMAL HIGH (ref 0–30)

## 2021-10-14 NOTE — Patient Instructions (Addendum)
Will order a spinal tap to check spinal fluid ANA, cell count, CMV, cryptococcal antigen, GS and culture, cytology, fungal culture, glucose, protein, hsV PCR, IgG, oligoclonal bands, VDRL,  ?and via [facility] labs, ADEVL Alzheimer panel and DMC2 autoimmune panel and 14-3-3.  Will get MRI of brain with and without contrast prior to spinal tap ?For blood work, check CBC, CMP, RPR, ammonia, ESR, CRP, ANA with reflex and HIV  ?Check DAT scan ?Further recommendations pending results. ?Follow up after testing ?

## 2021-10-14 NOTE — Telephone Encounter (Signed)
Called and spoke with pt to see if she was ever able to get a refill of her Ventolin inhaler and she stated that she was able to. Nothing further needed. ?

## 2021-10-15 ENCOUNTER — Ambulatory Visit: Payer: Medicare HMO | Admitting: Internal Medicine

## 2021-10-15 LAB — RPR: RPR Ser Ql: NONREACTIVE

## 2021-10-15 LAB — HIV ANTIBODY (ROUTINE TESTING W REFLEX): HIV 1&2 Ab, 4th Generation: NONREACTIVE

## 2021-10-15 LAB — AMMONIA: Ammonia: 40 umol/L (ref <=72)

## 2021-10-16 ENCOUNTER — Telehealth: Payer: Self-pay | Admitting: Neurology

## 2021-10-16 LAB — ANA W/REFLEX: Anti Nuclear Antibody (ANA): NEGATIVE

## 2021-10-16 NOTE — Telephone Encounter (Signed)
Patient called and stated she has some questions about a few appointments that were set up at other offices.   ?

## 2021-10-16 NOTE — Progress Notes (Signed)
PA Approved for 10/16/21-11/15/21. ?[id number] approval number.  ? ? ?Appt scheduled for 10/22/21 at 4 pm at  [facility]. Please arrive at 3:30 pm. ? ?Pt to schedule the LP with [facility] after the MRI. ? ?DATScan, david to call patient.  ? ? ? ?Pt advised.  ?

## 2021-10-17 NOTE — Telephone Encounter (Signed)
Called patient left voicemail message 

## 2021-10-20 ENCOUNTER — Other Ambulatory Visit (HOSPITAL_COMMUNITY)
Admission: RE | Admit: 2021-10-20 | Discharge: 2021-10-20 | Disposition: A | Discharge status: Home / Self Care | Payer: Medicare HMO | Source: Ambulatory Visit | Attending: Interventional Radiology | Admitting: Interventional Radiology

## 2021-10-20 ENCOUNTER — Ambulatory Visit
Admission: RE | Admit: 2021-10-20 | Discharge: 2021-10-20 | Disposition: A | Discharge status: Home / Self Care | Payer: Medicare HMO | Source: Ambulatory Visit | Attending: Obstetrics and Gynecology | Admitting: Obstetrics and Gynecology

## 2021-10-20 DIAGNOSIS — E041 Nontoxic single thyroid nodule: Secondary | ICD-10-CM

## 2021-10-20 DIAGNOSIS — D34 Benign neoplasm of thyroid gland: Secondary | ICD-10-CM | POA: Insufficient documentation

## 2021-10-21 LAB — CYTOLOGY - NON PAP

## 2021-10-22 ENCOUNTER — Encounter (HOSPITAL_COMMUNITY)
Admission: RE | Admit: 2021-10-22 | Discharge: 2021-10-22 | Disposition: A | Discharge status: Home / Self Care | Payer: Medicare HMO | Source: Ambulatory Visit | Attending: Neurology | Admitting: Neurology

## 2021-10-22 ENCOUNTER — Ambulatory Visit (HOSPITAL_COMMUNITY)
Admission: RE | Admit: 2021-10-22 | Discharge: 2021-10-22 | Disposition: A | Discharge status: Home / Self Care | Payer: Medicare HMO | Source: Ambulatory Visit | Attending: Neurology | Admitting: Neurology

## 2021-10-22 DIAGNOSIS — R569 Unspecified convulsions: Secondary | ICD-10-CM | POA: Diagnosis not present

## 2021-10-22 DIAGNOSIS — G20 Parkinson's disease: Secondary | ICD-10-CM | POA: Diagnosis not present

## 2021-10-22 DIAGNOSIS — R413 Other amnesia: Secondary | ICD-10-CM | POA: Diagnosis not present

## 2021-10-22 DIAGNOSIS — R251 Tremor, unspecified: Secondary | ICD-10-CM | POA: Insufficient documentation

## 2021-10-22 DIAGNOSIS — F0280 Dementia in other diseases classified elsewhere without behavioral disturbance: Secondary | ICD-10-CM | POA: Insufficient documentation

## 2021-10-22 DIAGNOSIS — G35 Multiple sclerosis: Secondary | ICD-10-CM | POA: Diagnosis not present

## 2021-10-22 DIAGNOSIS — F319 Bipolar disorder, unspecified: Secondary | ICD-10-CM | POA: Diagnosis not present

## 2021-10-22 MED ORDER — IOFLUPANE I 123 185 MBQ/2.5ML IV SOLN
5.0000 | Freq: Once | INTRAVENOUS | Status: AC | PRN
Start: 2021-10-22 — End: 2021-10-22
  Administered 2021-10-22: 5 via INTRAVENOUS

## 2021-10-22 MED ORDER — POTASSIUM IODIDE (ANTIDOTE) 130 MG PO TABS
ORAL_TABLET | ORAL | Status: AC
  Filled 2021-10-22: qty 1

## 2021-10-22 MED ORDER — GADOBUTROL 1 MMOL/ML IV SOLN
10.0000 mL | Freq: Once | INTRAVENOUS | Status: AC | PRN
  Administered 2021-10-22: 10 mL via INTRAVENOUS

## 2021-10-22 NOTE — Progress Notes (Deleted)
Subjective:    Patient ID: [name], female    DOB: November 01, 1960, 61 y.o.   MRN: [id number] HPI F former smoker followed for asthma and allergic rhinitis, complicated by GERD, depression, VCD, seizure disorder PFT 07/05/08- FVC 3.51/ 88%, FEV1 2.38/ 78%, R 0.68, 25-75% 1.45/ 44%, TLC 83%, DLCO 68% Allergy Profile 01/18/2013-total IgE 242.4 with broad elevations for common inhalant allergens. -----------------------------------------------------------------------------------   10/21/20-61 yoF  former smoker followed for asthma and allergic rhinitis, complicated by GERD, Depression, VCD, seizure disorder, Obesity,  O2 2L sleep/ [employer] -Advair 500, ProAir hfa, Epipen, Fractured L ankle- January.  Covid vax- 2 Phizer Flu vax- had ACT score 23  -----Patient feels that breathing is doing very good. No concerns Feeling very well now with no cough, wheeze or unusual dyspnea. No recent acute illness. She is pending orthopedic surgery and asks clearance.  Continues to sleep with O2, but not needed in daytime. Started remotely for nocturnal hypoxemia attributed to obesity hypoventilation. Routinely uses Advair 500. Denies thrush.  PFT 10/21/20- Moderate obstructive airways disease with slight respnse to bronchodilator. Normal Diffusion. FEV!/FVC 0.75.  CXR 04/30/20- IMPRESSION: Unremarkable radiographs of the chest.  No acute findings.  10/23/21- 61 yoF  former smoker followed for asthma and allergic rhinitis, complicated by GERD, Depression, VCD, seizure disorder, Obesity,  O2 2L sleep/ [employer] -Advair 500, ProAir hfa, Epipen, Fractured L ankle- January.  Covid vax- 2 Phizer Flu vax- had ACT score 23     ROS-see HPI  + = positive Constitutional:   No-   weight loss, night sweats, fevers, chills, fatigue, lassitude. HEENT:   No-  headaches, difficulty swallowing, tooth/dental problems, sore throat,       +sneezing, itching, ear ache, nasal congestion, post nasal drip,  CV:  No-   chest  pain, orthopnea, PND, swelling in lower extremities, anasarca, dizziness, palpitations Resp: No-   shortness of breath with exertion or at rest.              No-   productive cough,  No non-productive cough,  No- coughing up of blood.              No-   change in color of mucus.  No- wheezing now.   Skin:  GI:  No-   heartburn, indigestion, abdominal pain, nausea, vomiting,  GU:  MS:  No-   joint pain or swelling.   Neuro-     nothing unusual Psych:  No- change in mood or affect. No depression or anxiety.  No memory loss.  Objective:  OBJ- Physical Exam General- Alert, Oriented, Affect-appropriate, Distress- none acute. + obese Skin- rash-none, lesions- none, excoriation- none Lymphadenopathy- none Head- atraumatic            Eyes- Gross vision intact, PERRLA, conjunctivae and secretions clear            Ears- Hearing, canals-normal            Nose- Clear, no-Septal dev, mucus, polyps, erosion, perforation             Throat- Mallampati II-III , mucosa clear , drainage- none, tonsils- atrophic Neck- flexible , trachea midline, no stridor , thyroid nl, carotid no bruit Chest - symmetrical excursion , unlabored           Heart/CV- RRR , no murmur , no gallop  , no rub, nl s1 s2                           -  JVD- none , edema- none, stasis changes- none, varices- none           Lung- clear to P&A, wheeze- none, cough- none , dullness-none, rub- none           Chest wall-  Abd-  Br/ Gen/ Rectal- Not done, not indicated Extrem- +boot on left lower leg Neuro- grossly intact to observation  Assessment & Plan:

## 2021-10-23 ENCOUNTER — Ambulatory Visit: Payer: Medicare HMO | Admitting: Internal Medicine

## 2021-10-28 DIAGNOSIS — F250 Schizoaffective disorder, bipolar type: Secondary | ICD-10-CM | POA: Diagnosis not present

## 2021-11-05 ENCOUNTER — Other Ambulatory Visit: Payer: Self-pay | Admitting: Family Medicine

## 2021-11-05 DIAGNOSIS — E559 Vitamin D deficiency, unspecified: Secondary | ICD-10-CM

## 2021-11-05 NOTE — Progress Notes (Signed)
? ?Office Visit Note ? ?Patient: [name]             ?Date of Birth: 09/05/1960           ?MRN: [id number]             ?PCP: Billie Ruddy, MD ?Referring: Billie Ruddy, MD ?Visit Date: 11/06/2021 ? ? ?Subjective:  ? ?History of Present Illness: [name] is a 61 y.o. female here for evaluation of joint pain and swelling with abnormal lab results. She has a history of joint pains and fibromyalgia but reportedly increase shoulder and wrist pain and some swelling involved.  These left arm symptoms and increase in joint swelling have been going on for at least 6 months.  She sustained left shoulder injury when falling and breaking her ankle that have MRI in March showing multiple abnormalities with rotator cuff tear tendinopathy moderate degenerative changes and some bursitis.  She reports treatment with intra-articular steroid injection that did not give an appreciable benefit.  She worked with physical therapy at the time but was mostly focused on her ankle and mobility.  She takes Tylenol and ibuprofen which are partially helpful.  He is trying to limit use of the ibuprofen on account of medical problems including CKD stage III.  She sees Dr. Percell Miller for orthopedic treatment reports planning right knee arthroplasty for advanced osteoarthritis. ?Her history is significant for newly identified major cognitive disorder for which she has ongoing neurology follow up.  This includes considerable memory and concentration difficulty, also having tremors and reports weakness coming and going.  She has numbness in the left hand also worsening during this time symptoms are most severe at nighttime.  Left wrist swelling has been present intermittently with worsening of associated symptoms wrist pain and hand numbness at that time.  Today the swelling is doing pretty well.  Her work-up so far shown some lesions on brain MRI imaging without definitive diagnosis so far.  She had inflammatory markers  elevated with high white blood cell count complement C4 and sedimentation rate test.  Specific antibody tests for ANA were all negative. ? ? ?Labs reviewed ?10/2021 ?ANA neg ?ESR 70 ?CRP 1.3 ? ?07/2021 ?Complement C4 62 ?ANA neg ?dsDNA, parietal cell, RNP, SM, ASMA, AMA, Scl-70, SSA, SSB, TPO Abs neg ? ?Imaging reviewed ?09/29/20 MRI left shoulder ?IMPRESSION: ?1. Partial thickness articular surface tear of the distal anterior supraspinatus tendon with moderate supraspinatus tendinopathy. ?2. Mild infraspinatus and subscapularis tendinopathy. ?3. Moderate degenerative glenohumeral arthropathy with moderate humeral head spurring and a central non-fragmented osteochondral lesion along the glenoid. Degenerative subcortical cystic lesion along the upper lateral margin of the humeral head. ?4. Trace subacromial subdeltoid bursitis. ? ?Activities of Daily Living:  ?Patient reports morning stiffness for 1 hour.   ?Patient Reports nocturnal pain.  ?Difficulty dressing/grooming: Reports ?Difficulty climbing stairs: Reports ?Difficulty getting out of chair: Reports ?Difficulty using hands for taps, buttons, cutlery, and/or writing: Reports ? ?Review of Systems  ?Constitutional:  Positive for fatigue.  ?HENT:  Positive for mouth sores, mouth dryness and nose dryness.   ?Eyes:  Positive for dryness. Negative for pain and itching.  ?Respiratory:  Positive for shortness of breath. Negative for difficulty breathing.   ?Cardiovascular:  Positive for chest pain and palpitations.  ?Gastrointestinal:  Negative for blood in stool, constipation and diarrhea.  ?Endocrine: Negative for increased urination.  ?Genitourinary:  Negative for difficulty urinating.  ?Musculoskeletal:  Positive for joint pain, joint pain, joint swelling and  morning stiffness. Negative for myalgias, muscle tenderness and myalgias.  ?Skin:  Negative for color change, rash and redness.  ?Allergic/Immunologic: Positive for susceptible to infections.  ?Neurological:   Positive for dizziness, numbness, headaches and weakness.  ?Hematological:  Positive for bruising/bleeding tendency.  ?Psychiatric/Behavioral:  Negative for sleep disturbance.   ? ?PMFS History:  ?Patient Active Problem List  ? Diagnosis Date Noted  ? Sedimentation rate elevation 11/06/2021  ? Nocturnal hypoxia 08/14/2021  ? Anaphylaxis 08/14/2021  ? Preoperative respiratory examination 08/14/2021  ? Major neurocognitive disorder due to multiple etiologies 08/05/2021  ? Chronic respiratory failure with hypoxia 05/06/2020  ? Schizoaffective disorder, bipolar type 10/04/2019  ? Mixed hyperlipidemia 10/04/2019  ? Vitamin D deficiency 10/04/2019  ? Major depressive disorder   ? Female pelvic peritoneal adhesions 12/19/2018  ? Galactorrhea not associated with childbirth 12/19/2018  ? Heart disease 12/19/2018  ? Class 3 severe obesity due to excess calories with serious comorbidity and body mass index (BMI) of 40.0 to 44.9 in adult 12/19/2018  ? Pain in pelvis 12/19/2018  ? Oropharyngeal dysphagia 03/01/2018  ? Weakness 09/11/2016  ? Seizure disorder ([place]) 12/30/2013  ? Disorder of sacrum 11/03/2013  ? Hiatal hernia 11/03/2013  ? Hypertension 11/03/2013  ? Intractable migraine with aura 11/03/2013  ? Lumbosacral spondylosis 11/03/2013  ? Fibromyalgia 11/03/2013  ? DDD (degenerative disc disease), lumbosacral 10/03/2013  ? Thyroid disease 10/03/2013  ? Lumbar radiculopathy 08/30/2012  ? Generalized anxiety disorder 04/05/2012  ? Elevated LFTs 10/15/2010  ? Abdominal pain 09/27/2009  ? Asthma, moderate persistent 06/04/2009  ? Vocal cord disorder 04/02/2009  ? Personal history of allergy to seafood 10/10/2008  ? Allergic rhinitis 09/20/2008  ? Dysphagia, pharyngoesophageal phase 09/12/2008  ? GERD (gastroesophageal reflux disease) 07/05/2008  ? Bipolar I disorder ([place]) 06/13/2008  ? OSA (obstructive sleep apnea) 06/13/2008  ? Arthritis 06/13/2008  ? Chronic back pain 06/13/2008  ? Hyperglycemia 06/13/2008  ?  ?Past Medical  History:  ?Diagnosis Date  ? Abdominal pain 09/27/2009  ? Allergic rhinitis 09/20/2008  ? Allergy skin test 10/10/08 Allergy Profile 01/18/2013-total IgE 242.4 with broad elevations for common inhalant allergens.   ? Arthritis   ? oa  ? Asthma, moderate persistent 06/04/2009  ? Allergy Profile 01/18/2013-total IgE 242.4 with broad elevations for common inhalant allergens. PFT 07/05/08- FVC 3.51/ 88%, FEV1 2.38/ 78%, R 0.68, 25-75% 1.45/ 44%, TLC 83%, DLCO 68% PFT 10/21/20- Moderate obstructive airways disease with slight respnse to bronchodilator. Normal Diffusion. FEV!/FVC 0.75   ? Backache, unspecified   ? Bipolar I disorder 06/13/2008  ? Chronic back pain 06/13/2008  ? Chronic respiratory failure with hypoxia 05/06/2020  ? Stable with routine use of O2 2L during sleep. Nocturnal hypoxemia with normal daytime oxygenation on room air.   ? Class 3 severe obesity due to excess calories with serious comorbidity and body mass index (BMI) of 40.0 to 44.9 in adult 12/19/2018  ? Closed left ankle fracture   ? DDD (degenerative disc disease), lumbosacral 10/03/2013  ? Disorder of sacrum 11/03/2013  ? Dysphagia, pharyngoesophageal phase 09/12/2008  ? Elevated LFTs 10/15/2010  ? Female pelvic peritoneal adhesions 12/19/2018  ? Fibromyalgia   ? Galactorrhea not associated with childbirth 12/19/2018  ? Generalized anxiety disorder 04/05/2012  ? GERD (gastroesophageal reflux disease) 07/05/2008  ? Heart disease 12/19/2018  ? Hiatal hernia 11/03/2013  ? Hyperglycemia 06/13/2008  ? Hypertension   ? Intractable migraine with aura 11/03/2013  ? Lumbar radiculopathy 08/30/2012  ? Lumbosacral spondylosis 11/03/2013  ? Major depressive  disorder 09/08/2019  ? Major neurocognitive disorder due to multiple etiologies 08/05/2021  ? Mixed hyperlipidemia 10/04/2019  ? On home oxygen therapy   ? nightly at 2l/m prn  ? Open left ankle fracture 07/16/2020  ? Oropharyngeal dysphagia 03/01/2018  ? OSA (obstructive sleep apnea) 06/13/2008  ? Pain in  pelvis 12/19/2018  ? PONV (postoperative nausea and vomiting)   ? Schizoaffective disorder, bipolar type 10/04/2019  ? Seizure disorder   ? last seizure 2014  ? Status post open reduction with internal fixat

## 2021-11-06 ENCOUNTER — Encounter: Payer: Self-pay | Admitting: Internal Medicine

## 2021-11-06 ENCOUNTER — Ambulatory Visit: Payer: Medicare HMO | Admitting: Internal Medicine

## 2021-11-06 VITALS — BP 149/90 | HR 103 | Ht 67.0 in | Wt 265.0 lb

## 2021-11-06 DIAGNOSIS — F0280 Dementia in other diseases classified elsewhere without behavioral disturbance: Secondary | ICD-10-CM | POA: Diagnosis not present

## 2021-11-06 DIAGNOSIS — M797 Fibromyalgia: Secondary | ICD-10-CM | POA: Diagnosis not present

## 2021-11-06 DIAGNOSIS — M1990 Unspecified osteoarthritis, unspecified site: Secondary | ICD-10-CM

## 2021-11-06 DIAGNOSIS — R700 Elevated erythrocyte sedimentation rate: Secondary | ICD-10-CM | POA: Insufficient documentation

## 2021-11-10 ENCOUNTER — Other Ambulatory Visit (HOSPITAL_COMMUNITY)
Admission: RE | Admit: 2021-11-10 | Discharge: 2021-11-10 | Disposition: A | Discharge status: Home / Self Care | Payer: Medicare HMO | Source: Ambulatory Visit | Attending: Neurology | Admitting: Neurology

## 2021-11-10 ENCOUNTER — Ambulatory Visit
Admission: RE | Admit: 2021-11-10 | Discharge: 2021-11-10 | Disposition: A | Discharge status: Home / Self Care | Payer: Medicare HMO | Source: Ambulatory Visit | Attending: Neurology | Admitting: Neurology

## 2021-11-10 DIAGNOSIS — Z820 Family history of epilepsy and other diseases of the nervous system: Secondary | ICD-10-CM

## 2021-11-10 DIAGNOSIS — F0280 Dementia in other diseases classified elsewhere without behavioral disturbance: Secondary | ICD-10-CM

## 2021-11-10 DIAGNOSIS — Z0389 Encounter for observation for other suspected diseases and conditions ruled out: Secondary | ICD-10-CM | POA: Diagnosis not present

## 2021-11-10 DIAGNOSIS — F0390 Unspecified dementia without behavioral disturbance: Secondary | ICD-10-CM | POA: Diagnosis not present

## 2021-11-10 NOTE — Discharge Instructions (Signed)

## 2021-11-10 NOTE — Progress Notes (Signed)
1 vial of blood drawn from pts RAC to be sent off with LP lab work. 1 successful attempt. Pt tolerated well. Gauze and tape applied after.  ?

## 2021-11-11 LAB — TIQ-MISC

## 2021-11-11 LAB — CYTOLOGY - NON PAP

## 2021-11-12 DIAGNOSIS — M25561 Pain in right knee: Secondary | ICD-10-CM | POA: Diagnosis not present

## 2021-11-13 LAB — HERPES SIMPLEX VIRUS, TYPE 1 AND 2 DNA,QUAL,RT PCR
HSV 1 DNA: NOT DETECTED
HSV 2 DNA: NOT DETECTED

## 2021-11-14 ENCOUNTER — Other Ambulatory Visit: Payer: Self-pay

## 2021-11-14 DIAGNOSIS — R4189 Other symptoms and signs involving cognitive functions and awareness: Secondary | ICD-10-CM

## 2021-11-14 DIAGNOSIS — G3184 Mild cognitive impairment, so stated: Secondary | ICD-10-CM

## 2021-11-19 LAB — MAYO MISC ORDER,CSF: PRICE:: 1240

## 2021-11-19 LAB — MAYO MISC ORDER 2: PRICE:: 0

## 2021-11-20 DIAGNOSIS — M25561 Pain in right knee: Secondary | ICD-10-CM | POA: Diagnosis not present

## 2021-11-24 ENCOUNTER — Other Ambulatory Visit: Payer: Self-pay | Admitting: Family Medicine

## 2021-11-24 DIAGNOSIS — I10 Essential (primary) hypertension: Secondary | ICD-10-CM

## 2021-11-24 DIAGNOSIS — M25561 Pain in right knee: Secondary | ICD-10-CM | POA: Diagnosis not present

## 2021-11-25 ENCOUNTER — Telehealth: Payer: Self-pay

## 2021-11-25 NOTE — Telephone Encounter (Signed)
Spoke to [place] at [facility], Wanted to make sure patient supposed to have a second LP done for the same labs. Explained that the y tried their best to get to at least 20 cc of Spinal fluid, unable to reach the target and received 15 cc. Per Tillie Rung she made sure that the Alzheimer  panel was drawn first the results are in the sytem now as of 11/24/21. As for the [facility] panel it looks like not enough sample was sent .  Spoke to [facility] since Dr.Jaffe is out of the office, go ahead and have the LP done for the [facility] panel can be ran. Patient's Protein 14-3-3 elevated.    Telephone call back to [place], GO ahead and have the patient keep her appt with you guys on 11/27/21.

## 2021-11-26 ENCOUNTER — Telehealth: Payer: Self-pay | Admitting: Family Medicine

## 2021-11-26 NOTE — Telephone Encounter (Signed)
[facility] call and need a new RX for metoprolol succinate (TOPROL-XL) 50 MG 24 hr tablet  sent to [facility].

## 2021-11-27 ENCOUNTER — Ambulatory Visit
Admission: RE | Admit: 2021-11-27 | Discharge: 2021-11-27 | Disposition: A | Discharge status: Home / Self Care | Payer: Medicare HMO | Source: Ambulatory Visit | Attending: Neurology | Admitting: Neurology

## 2021-11-27 VITALS — BP 137/84 | HR 65

## 2021-11-27 DIAGNOSIS — G3184 Mild cognitive impairment, so stated: Secondary | ICD-10-CM | POA: Diagnosis not present

## 2021-11-27 DIAGNOSIS — R4189 Other symptoms and signs involving cognitive functions and awareness: Secondary | ICD-10-CM

## 2021-11-27 DIAGNOSIS — F0390 Unspecified dementia without behavioral disturbance: Secondary | ICD-10-CM | POA: Diagnosis not present

## 2021-11-27 NOTE — Discharge Instructions (Signed)

## 2021-11-27 NOTE — Telephone Encounter (Signed)
ATC pt, VM full. Medication was just refilled to local pharmacy. Need to know what pharmacy she prefers. Was she able to get Rx from local pharmacy?

## 2021-11-28 NOTE — Telephone Encounter (Signed)
Pt stated that she received her metoprolol. No further actions needed.

## 2021-12-05 LAB — MAYO MISC ORDER: PRICE:: 1691.3

## 2021-12-09 NOTE — Progress Notes (Deleted)
Subjective:    Patient ID: [name], female    DOB: [birth year], 61 y.o.   MRN: [id number] HPI F former smoker followed for asthma and allergic rhinitis, complicated by GERD, depression, VCD, seizure disorder PFT 07/05/08- FVC 3.51/ 88%, FEV1 2.38/ 78%, R 0.68, 25-75% 1.45/ 44%, TLC 83%, DLCO 68% Allergy Profile 01/18/2013-total IgE 242.4 with broad elevations for common inhalant allergens. PFT 10/21/20- Moderate obstructive airways disease with slight respnse to bronchodilator. Normal Diffusion. FEV!/FVC 0.75. ----------------------------------------------------------------------------------- 12/22/2018- 61 yoF  former smoker followed for asthma and allergic rhinitis, complicated by GERD, depression, VCD, seizure disorder O2 2L sleep/ [employer] Advair 500, ProAir hfa, Epipen, ------last seen in 2015, pt states breathing has not been stable, having lots of wheezing  She tells me she had done very well fora long time, out of her inhaled meds. Then she cleaned dusty closets using Lysol spray and had flare for which she called requesting meds till this appointment. Now feels almost back to baseline. Asthma not disturbing sleep.  Has slept w O2 2L for years. Aware of seasonal allergy triggers- pollens.Denies heart problems, edema, palpitation, fever.  Activity limited by herniated disk- rolling walker. CXR 01/09/18- Lungs are clear. Heart size and pulmonary vascularity are normal. No adenopathy. No bone lesions. IMPRESSION: No edema or consolidation.  10/21/20-61 yoF  former smoker followed for COPD,  Allergic Rhinitis, complicated by GERD, SchizoAffective Disorder, BiPolar,  Depression, VCD, Seizure disorder, Obesity,  O2 2L sleep/ [employer] -Trelegy 100, ProAir hfa, Epipen, Neb albuterol,  Fractured L ankle- January.  Covid vax- 2 Phizer Flu vax- had ACT score 23  -----Patient feels that breathing is doing very good. No concerns Feeling very well now with no cough, wheeze or unusual dyspnea. No  recent acute illness. She is pending orthopedic surgery and asks clearance.  Continues to sleep with O2, but not needed in daytime. Started remotely for nocturnal hypoxemia attributed to obesity hypoventilation. Routinely uses Advair 500. Denies thrush.  PFT 10/21/20- Moderate obstructive airways disease with slight respnse to bronchodilator. Normal Diffusion. FEV!/FVC 0.75.  CXR 04/30/20- IMPRESSION: Unremarkable radiographs of the chest.  No acute findings.  12/11/21- 61 yoF  former smoker followed for COPD,  Allergic Rhinitis, complicated by GERD, Depression, VCD, Seizure disorder, Obesity,  O2 2L sleep/ [employer] -Trelegy 100, ProAir hfa, Epipen, Neb albuterol,  Fractured L ankle- January.  Covid vax- 2 Phizer Flu vax- had   ROS-see HPI  + = positive Constitutional:   No-   weight loss, night sweats, fevers, chills, fatigue, lassitude. HEENT:   No-  headaches, difficulty swallowing, tooth/dental problems, sore throat,       +sneezing, itching, ear ache, nasal congestion, post nasal drip,  CV:  No-   chest pain, orthopnea, PND, swelling in lower extremities, anasarca, dizziness, palpitations Resp: No-   shortness of breath with exertion or at rest.              No-   productive cough,  No non-productive cough,  No- coughing up of blood.              No-   change in color of mucus.  No- wheezing now.   Skin:  GI:  No-   heartburn, indigestion, abdominal pain, nausea, vomiting,  GU:  MS:  No-   joint pain or swelling.   Neuro-     nothing unusual Psych:  No- change in mood or affect. No depression or anxiety.  No memory loss.  Objective:  OBJ- Physical Exam General-  Alert, Oriented, Affect-appropriate, Distress- none acute. + obese Skin- rash-none, lesions- none, excoriation- none Lymphadenopathy- none Head- atraumatic            Eyes- Gross vision intact, PERRLA, conjunctivae and secretions clear            Ears- Hearing, canals-normal            Nose- Clear, no-Septal dev, mucus,  polyps, erosion, perforation             Throat- Mallampati II-III , mucosa clear , drainage- none, tonsils- atrophic Neck- flexible , trachea midline, no stridor , thyroid nl, carotid no bruit Chest - symmetrical excursion , unlabored           Heart/CV- RRR , no murmur , no gallop  , no rub, nl s1 s2                           - JVD- none , edema- none, stasis changes- none, varices- none           Lung- clear to P&A, wheeze- none, cough- none , dullness-none, rub- none           Chest wall-  Abd-  Br/ Gen/ Rectal- Not done, not indicated Extrem- +boot on left lower leg Neuro- grossly intact to observation  Assessment & Plan:

## 2021-12-11 ENCOUNTER — Ambulatory Visit: Payer: Medicare HMO | Admitting: Internal Medicine

## 2021-12-16 ENCOUNTER — Telehealth: Payer: Self-pay | Admitting: Neurology

## 2021-12-16 NOTE — Telephone Encounter (Signed)
Patient called and stated she had a lumbar puncture and bloodwork a while back.  She was calling to get the results.

## 2021-12-17 ENCOUNTER — Other Ambulatory Visit (HOSPITAL_COMMUNITY): Payer: Medicare HMO

## 2021-12-19 NOTE — Progress Notes (Unsigned)
NEUROLOGY FOLLOW UP OFFICE NOTE  [name] [id number]  Assessment/Plan:   Major neurocognitive disorder, unclear etiology but likely multifactorial.  She has cerebrovascular disease and psychiatric comorbidities but there are concern for LBD and Alzheimer's which she could have both. Seizure disorder, stable.  EEG normal.  Off AED. Migraine without aura, without status migrainosus, not intractable White matter lesions on brain MRI.  Prior diagnosis of MS.  Never was on DMT.  My assessment is that findings are chronic small vessel ischemic changes and not demyelinating disease     Check serum CBC, CMP, RPR, ammonia, ESR, CRP, ANA with reflex, HIV. Order LP for CSF analysis:  Cell count, protein, glucose, gram stain and culture, fungal culture, CMV, cryptococcal antigen, ANA, cytology, HSV PCR, VDRL, IgG, oligoclonal bands and [facility] labs (ADEVL Alzheimer's panel and DMC2 autoimmune panel and 14-3-3) Repeat MRI of brain with and without contrast (will need to be performed prior to LP. Check DAT scan Pending results, will consider referral to the [facility] clinic at [facility]. Follow up after testing     Subjective:  [name] is a 61 year old right-handed woman with seizure disorder, questionable multiple sclerosis, fibromyalgia, Bipolar disorder, depression and OSA who follows up for seizure disorder, migraines and MCI.   UPDATE: Underwent further dementia workup. DaT scan on 10/22/2021 personally reviewed revealed asymmetric decreased radiotracer activity in the right striatum.  Serum labs from 10/14/2021 demonstrated elevated sed rate 70 with normal CRP 1.3, normal ammonia 40, non-reactive HIV, non-reactive RPR, negative ANA, normal CBC, normal CMP (except elevated glucose 121).  Underwent LP for CSF analysis in May which demonstrated cell count 4; protein 46; glucose 74; negative VDRL; normal IgG synthesis rate and index; greater than 5 oligoclonal bands in both CSF and serum;  negative cryptococcal antigen; <0.2% probability of prion disease with elevated 14-3-3 protein of 5.056 but negative RT-QuIC, and T-TAU protein 299; negative culture; negative fungal culture; negative HSV 1/2 DNA; negative [facility] dementia autoimmune paraneoplastic panel; negative [facility] Alzheimer's Disease evaluation (p-Tau/Abeta42 0.013, Abeta42 1232, total-Tau 176, Phospho-Tau 16.5); negative cytology.   Lives with sister who drives her to appointments and helps with her daily activities.   HISTORY: I  SEIZURE DISORDER: She has had history of seizures since childhood.  Previously took phenobarbital until age 61.  She was seizure-free for years until December 2014, in which she had 2 suspected seizures followed by a third seizure in February 2015.  She was noted by her sister to be unresponsive, shaking with eyes rolled back with tongue biting lasting 3 minutes.  No bowel or bladder incontinence.  Afterwards, she was confused and agitated.  She was started on Keppra 537m twice daily.  No recurrent seizures since 2015.  Keppra was subsequently discontinued after several years.  She had a repeat MRI of brain on 01/20/2019 which demonstrated mild generalized atrophy and again mild nonspecific cerebral white matter disease, similar to prior MRI from 10/18/2013. Awake and asleep EEG from 05/05/2019 was normal.  Therefore, Keppra was not restarted.   II QUESTIONABLE HISTORY OF MULTIPLE SCLEROSIS, UNLIKELY.  MRI BRAIN FINDINGS MORE CONSISTENT WITH SMALL VESSEL DISEASE:  [place], unsteady gait and difficulty with fine motor skills.  She took Betaseron until 2005.  MRI of brain with and without contrast from 11/03/04 reportedly demonstrated "no lesion indentified in the brain which would be characteristic for multiple sclerosis."  MRI of brain with and without contrast  from 01/29/11 was personally reviewed and demonstrated mild non-enhancing  nonspecific punctate T2 hyperintensities in the subcortical white matter, mildly increased on repeat study on 10/18/13.  She was admitted to [facility] on 09/11/16 for bilateral lower extremity weakness with LLQ abdominal pain and nausea.  MRI of cervical, thoracic and lumbar spine were personally reviewed and demonstrated no demyelinating spinal cord disease with mild disc bulging and facet hypertrophy at L3-4, L4-5 and L5-S1, but no significant spinal stenosis.  HIV testing was negative.   III MEMORY DEFICITS: She started having short-term memory problems in December 2019, however I presume symptoms started earlier.  At the time, her family noticed the problem.  She will lose her train of thought mid-sentence or she will forget why she walked into a room.  She is told that she keeps repeating questions.  She lives with her sister.  Her sister took away her car keys because she got confused driving on a familiar route.  One time, she gave a cab driver an extra $88.  She is now having trouble paying her bills and requires help from her sister.  She reports needing help bathing and getting dress.  She started putting on clothes backwards.  Her daughter got married in Spring 2019 and she cannot remember the wedding.  She reports hallucinations.  She woke up from a dream and turned over to briefly find a baby in her bed.  She underwent neuropsychological testing at [facility] on 11/23/18.  She did demonstrated reduced effort but no clear evidence of Alzheimer's disease or impairment often associated with MS.  Likely subjective memory deficits related to psychogenic factors but ultimately unclear.  She underwent repeat neuropsychological testing on 09/07/2019 demonstrated diffuse mild neurocognitive disorder of unclear etiology, possibly due to psychiatric factors as she demonstrated severe anxiety and moderate depression, but not definite.  She appeared to exhibit visual deficits on exam, skipping many items  unintentionally and often circled responses of the bottom half of one answer and the top half of another answer.  In 2022, she reports slight worsening in memory.  She reports that sometimes she doesn't recognize close family members.  She may not remember being in houses that she has been to in the past.  She lives with two of her sisters.  Her sister and daughter manage her bills and finances.  She sometimes sees people in the house who aren't there.  Sometimes, she makes paraphasic errors.  She may call a tree by a different name.  She does not drive.  She underwent repeat neuropsychological evaluation on 08/05/2021 which demonstrated significant cognitive decline compared to prior evaluation with diffuse and severe cognitive impairment meeting criteria for major neurocognitive disorder.  Etiology unclear and likely multifactorial but there is concern for Lewy Body Dementia (formed hallucinations, REM sleep behavior disorder, visual spatial and executive dysfunction) as well as possible Alzheimer's disease (amnestic across memory testing)   Labs from 07/20/18 include B12 479, folate 17.8, TSH 0.43.  CBC and CMP were unremarkable.   Both of her parents had Alzheimer's disease (in their 31s) and her sister was diagnosed in her early 4s..   IV CHRONIC LOW-BACK PAIN: She has chronic right sided low back pain radiating down the right leg.  MRI of lumbar spine in 2018 demonstrated mild disc bulging and facet hypertrophy at L3-4, L4-5 and L5-S1.  She was evaluated by orthopedics at [facility] and underwent conservative management.  Repeat MRI lumbar spine from 01/15/2020 showed interval progression of  multilevel spondylosis with central L5-S1 disc protrusion abutting left greater than right S1 nerve roots and left extraforaminal L4-5 annular fissuring.  She is undergoing injections.              V HEADACHES: She has history of migraines but headaches started getting worse in September 2019.  She reports  severe frontal or temporal pressure pain.  No preceding aura.  There is associated nausea, photophobia but no vomiting, phonophobia, visual disturbance or unilateral numbness or weakness.  They usually last 3 to 4 hours.  They are infrequent and she hasn't had one in several months.  She usually treats it with Motrin and Flexeril.   Current NSAIDs:  Motrin Current analgesics:  none Current muscle relaxant:  Flexeril Current antihypertensive:  metoprolol Current antidepressant:  Doxepin 53m at bedtime, Prozac Current antiepileptic:  Lamotrigine 285mat bedtime Current sleep aid:  Trazodone Other medications:  Trazodone, Stelazine, Cogentin   Past antiemetic:  Reglan Past muscle relaxant:  Robaxin Past antidepressant:  Cymbalta, amitriptyline Past antiepileptic:  topiramate 10075mwice daily, gabapentin Other past medications:  Latuda, Seroquel, Sonata, Ambien  PAST MEDICAL HISTORY: Past Medical History:  Diagnosis Date   Abdominal pain 09/27/2009   Allergic rhinitis 09/20/2008   Allergy skin test 10/10/08 Allergy Profile 01/18/2013-total IgE 242.4 with broad elevations for common inhalant allergens.    Arthritis    oa   Asthma, moderate persistent 06/04/2009   Allergy Profile 01/18/2013-total IgE 242.4 with broad elevations for common inhalant allergens. PFT 07/05/08- FVC 3.51/ 88%, FEV1 2.38/ 78%, R 0.68, 25-75% 1.45/ 44%, TLC 83%, DLCO 68% PFT 10/21/20- Moderate obstructive airways disease with slight respnse to bronchodilator. Normal Diffusion. FEV!/FVC 0.75    Backache, unspecified    Bipolar I disorder 06/13/2008   Chronic back pain 06/13/2008   Chronic respiratory failure with hypoxia 05/06/2020   Stable with routine use of O2 2L during sleep. Nocturnal hypoxemia with normal daytime oxygenation on room air.    Class 3 severe obesity due to excess calories with serious comorbidity and body mass index (BMI) of 40.0 to 44.9 in adult 12/19/2018   Closed left ankle fracture    DDD  (degenerative disc disease), lumbosacral 10/03/2013   Disorder of sacrum 11/03/2013   Dysphagia, pharyngoesophageal phase 09/12/2008   Elevated LFTs 10/15/2010   Female pelvic peritoneal adhesions 12/19/2018   Fibromyalgia    Galactorrhea not associated with childbirth 12/19/2018   Generalized anxiety disorder 04/05/2012   GERD (gastroesophageal reflux disease) 07/05/2008   Heart disease 12/19/2018   Hiatal hernia 11/03/2013   Hyperglycemia 06/13/2008   Hypertension    Intractable migraine with aura 11/03/2013   Lumbar radiculopathy 08/30/2012   Lumbosacral spondylosis 11/03/2013   Major depressive disorder 09/08/2019   Major neurocognitive disorder due to multiple etiologies 08/05/2021   Mixed hyperlipidemia 10/04/2019   On home oxygen therapy    nightly at 2l/m prn   Open left ankle fracture 07/16/2020   Oropharyngeal dysphagia 03/01/2018   OSA (obstructive sleep apnea) 06/13/2008   Pain in pelvis 12/19/2018   PONV (postoperative nausea and vomiting)    Schizoaffective disorder, bipolar type 10/04/2019   Seizure disorder    last seizure 2014   Status post open reduction with internal fixation (ORIF) of fracture of ankle 08/26/2020   L ankle   Thyroid disease 10/03/2013   Vitamin D deficiency 10/04/2019   Vocal cord disorder 04/02/2009   Weakness 09/11/2016    MEDICATIONS: Current Outpatient Medications on File Prior to Visit  Medication Sig  Dispense Refill   acetaminophen (TYLENOL) 500 MG tablet Take 1,000 mg by mouth every 6 (six) hours as needed for moderate pain.     albuterol (PROVENTIL) (2.5 MG/3ML) 0.083% nebulizer solution Take 3 mLs by nebulization every 4 (four) hours as needed for wheezing or shortness of breath. 75 mL 12   albuterol (VENTOLIN HFA) 108 (90 Base) MCG/ACT inhaler Inhale 2 puffs into the lungs every 6 (six) hours as needed for wheezing or shortness of breath. 8 g 5   aspirin EC 81 MG EC tablet Take 1 tablet (81 mg total) by mouth 2 (two) times  daily. Swallow whole. 30 tablet 11   benztropine (COGENTIN) 0.5 MG tablet Take 0.5 mg by mouth daily.      EPINEPHrine 0.3 mg/0.3 mL IJ SOAJ injection Inject 0.3 mg into the muscle daily as needed for anaphylaxis. 1 each 3   famotidine (PEPCID) 20 MG tablet Take 1 tablet (20 mg total) by mouth 2 (two) times daily. For stomach acid. (Patient taking differently: Take 20 mg by mouth daily.) 60 tablet 0   FLUoxetine (PROZAC) 40 MG capsule Take 80 mg by mouth daily.     Fluticasone-Umeclidin-Vilant (TRELEGY ELLIPTA) 100-62.5-25 MCG/ACT AEPB Inhale 1 puff into the lungs daily. 28 each 3   HYDROcodone-acetaminophen (NORCO/VICODIN) 5-325 MG tablet Take 1-2 tablets by mouth every 4 (four) hours as needed for moderate pain (pain score 4-6). (Patient not taking: Reported on 11/06/2021) 30 tablet 0   hydrOXYzine (ATARAX/VISTARIL) 25 MG tablet Take 25 mg by mouth 4 (four) times daily as needed for anxiety or itching.  2   lamoTRIgine (LAMICTAL) 200 MG tablet Take 200 mg by mouth at bedtime.     metoprolol succinate (TOPROL-XL) 50 MG 24 hr tablet TAKE 1 TABLET BY MOUTH EVERY DAY TAKE WITH OR IMMEDIATELY FOLLOWING A MEAL. 90 tablet 1   mirtazapine (REMERON) 15 MG tablet Take 15 mg by mouth at bedtime.     Multiple Vitamin (MULTIVITAMIN WITH MINERALS) TABS Take 1 tablet by mouth daily. For nutritional supplementation. (Patient taking differently: Take 1 tablet by mouth daily.) 30 tablet 0   ondansetron (ZOFRAN) 4 MG tablet Take 1 tablet (4 mg total) by mouth every 6 (six) hours as needed for nausea. 20 tablet 0   polyethylene glycol powder (GLYCOLAX/MIRALAX) 17 GM/SCOOP powder Take 17 g by mouth daily as needed. 1350 g 3   potassium chloride SA (KLOR-CON M) 20 MEQ tablet Take 1 tablet (20 mEq total) by mouth daily. 3 tablet 0   tiZANidine (ZANAFLEX) 4 MG tablet Take 4 mg by mouth 3 (three) times daily as needed for muscle spasms.     VITAMIN D PO Take 1 capsule by mouth daily.      Vitamin D, Ergocalciferol,  (DRISDOL) 1.25 MG (50000 UNIT) CAPS capsule Take 1 capsule (50,000 Units total) by mouth every 7 (seven) days. (Patient not taking: Reported on 11/06/2021) 12 capsule 0   ziprasidone (GEODON) 20 MG capsule Take 20 mg by mouth 2 (two) times daily with a meal.     No current facility-administered medications on file prior to visit.    ALLERGIES: Allergies  Allergen Reactions   Bee Venom Anaphylaxis   Depakote [Divalproex Sodium] Shortness Of Breath and Rash   Haloperidol Lactate Swelling   Hornet Venom Anaphylaxis   Latex Anaphylaxis    mild rash/itching, wheezing/sob   Peanut-Containing Drug Products Anaphylaxis   Penicillins Anaphylaxis and Swelling   Quetiapine Anaphylaxis and Other (See Comments)   Shellfish-Derived Products  Anaphylaxis   Valproic Acid Swelling   Amitriptyline Hcl Other (See Comments)    HALLUCINATIONS   Butorphanol Tartrate Other (See Comments)    Hallucinations   Gabapentin Other (See Comments)    Pt reports severe back and side pain   Iodine Other (See Comments)    Flushing and fainting   Pregabalin Rash   Terbutaline Rash    FAMILY HISTORY: Family History  Problem Relation Age of Onset   Heart disease Father    Ovarian cancer Mother    Breast cancer Mother    Alzheimer's disease Sister 38   Colon cancer Neg Hx    Esophageal cancer Neg Hx    Stomach cancer Neg Hx    Rectal cancer Neg Hx       Objective:  *** General: No acute distress.  Patient appears ***-groomed.   Head:  Normocephalic/atraumatic Eyes:  Fundi examined but not visualized Neck: supple, no paraspinal tenderness, full range of motion Heart:  Regular rate and rhythm Lungs:  Clear to auscultation bilaterally Back: No paraspinal tenderness Neurological Exam: alert and oriented to person, place, and time.  Speech fluent and not dysarthric, language intact.  CN II-XII intact. Bulk and tone normal, muscle strength 5/5 throughout.  Sensation to light touch intact.  Deep tendon  reflexes 2+ throughout, toes downgoing.  Finger to nose testing intact.  Gait normal, Romberg negative.   Metta Clines, DO  CC: ***

## 2021-12-22 ENCOUNTER — Encounter: Payer: Self-pay | Admitting: Neurology

## 2021-12-22 ENCOUNTER — Ambulatory Visit (INDEPENDENT_AMBULATORY_CARE_PROVIDER_SITE_OTHER): Payer: Medicare HMO | Admitting: Neurology

## 2021-12-22 VITALS — BP 101/71 | HR 84 | Ht 68.0 in | Wt 268.0 lb

## 2021-12-22 DIAGNOSIS — G40909 Epilepsy, unspecified, not intractable, without status epilepticus: Secondary | ICD-10-CM

## 2021-12-22 DIAGNOSIS — R9082 White matter disease, unspecified: Secondary | ICD-10-CM | POA: Diagnosis not present

## 2021-12-22 DIAGNOSIS — G3183 Dementia with Lewy bodies: Secondary | ICD-10-CM | POA: Diagnosis not present

## 2021-12-22 DIAGNOSIS — F02A2 Dementia in other diseases classified elsewhere, mild, with psychotic disturbance: Secondary | ICD-10-CM | POA: Diagnosis not present

## 2021-12-22 MED ORDER — RIVASTIGMINE TARTRATE 1.5 MG PO CAPS: 1.5000 mg | ORAL_CAPSULE | Freq: Two times a day (BID) | ORAL | 0 refills | Status: DC

## 2021-12-22 NOTE — Patient Instructions (Signed)
Start rivastigmine 1.'5mg'$  twice daily.  Contact me in 4 weeks and I will increase dose Follow up 6 months.    Lewy Body Dementia Lewy body dementia, also called dementia with Lewy bodies, is a condition that affects the way the brain functions. It is one form of dementia. In this condition, proteins called Lewy bodies build up in certain areas of the brain. This causes problems with: Memory. Decision making. Behavior. Speaking. Thinking and problem solving. Movements and balance. This condition is progressive, which means that it gets worse with time (is degenerative) and cannot be reversed. What are the causes? This condition is caused by the buildup of Lewy bodies in brain cells in areas of the brain that control memory, thinking, and movement. It is not known what causes the Lewy bodies to build up. What increases the risk? You are more likely to develop this condition if you: Have a family history of Lewy body dementia or Parkinson's disease. Are 45 years old or older. Are female. What are the signs or symptoms? Symptoms of this condition may include: Seeing things that are not there (hallucinating). Sleep problems, such as acting out dreams while you are asleep. Changes in memory, attention, and concentration that come and go (fluctuate). Symptoms of dementia, such as: Trouble with memory. Trouble paying attention. Problems with planning and organizing. Problems with judgment. Behavioral problems. Symptoms of Parkinson's disease, such as: Shaking movements that you cannot control (tremor). Tremors usually start in a hand or foot when you are resting (resting tremor). Stooped posture. Slowing of movement. Stiff muscles (rigidity). Loss of balance and stability when standing. How is this diagnosed? This condition is diagnosed by a specialist who diagnoses and treats this condition (neurologist). Your health care provider will talk with you and your family, friends, or  caregivers about your history and symptoms. A thorough medical history will be taken, and you will have a physical exam and tests. Tests may include: Lab tests, such as blood or urine tests. Imaging tests, such as a CT scan, a PET scan, or an MRI. A test that involves removing and testing a small amount of the fluid that surrounds the brain and spinal cord (lumbar puncture). Tests that evaluate brain function, such as memory tests, cognitive tests, and neuropsychological tests. How is this treated? There is no cure for this condition. Treatment focuses on managing your symptoms. Treatment may include: Medicines to help with hallucinations, sleep, and behavioral problems. Speech, occupational, and physical therapy. Your health care provider can help direct you to support groups, organizations, and other health care providers who can help with decisions about your care. Follow these instructions at home: Medicines Take over-the-counter and prescription medicines only as told by your health care provider. To help you manage your medicines, use a pill organizer or pill reminder. Avoid taking medicines that can affect thinking, such as pain medicines or certain sleeping medicines. Always check with your health care provider before taking any new medicines. Lifestyle Make healthy lifestyle choices: Be physically active as told by your health care provider. Do not use any products that contain nicotine or tobacco, such as cigarettes, e-cigarettes, and chewing tobacco. If you need help quitting, ask your health care provider. Try to practice stress-management techniques when you experience stress, such as mindfulness, yoga, or deep breathing. Stay socially connected. Talk regularly with other people, such as family, friends, and neighbors. Make sure you sleep well. These tips can help you get a good night's rest: Avoid napping during the  day. Keep your sleeping area dark and cool. Avoid exercising a  few hours before you go to bed. Avoid caffeine products in the evening. Eating and drinking Do not drink alcohol. Drink enough fluid to keep your urine pale yellow. Eat a healthy diet. Safety  Work with your health care provider to determine what you need help with and what your safety needs are. If you have trouble moving around, use a cane or walker as told by your health care provider. Make sure your home environment is safe. To do this: Remove things that can be a tripping hazard, such as throw rugs or clutter. Install grab bars and railings in your home to prevent falls. Talk with your health care provider about whether it is safe for you to drive. If you were given a bracelet that identifies you as a person with memory loss or tracks your location, make sure to wear it at all times. General instructions  Work with your family to make important decisions, such as advance directives, medical power of attorney, or a living will. Keep all follow-up visits. This is important. Where to find more information [employer] of Neurological Disorders and Stroke: [employer] Contact a health care provider if you have: New or worsening confusion. New or worsening trouble with sleeping or increased daytime sleepiness. Problems with choking or swallowing. Get help right away if: You feel depressed or sad, or feel that you want to harm yourself. Your family members become concerned for your safety. If you ever feel like you may hurt yourself or others, or have thoughts about taking your own life, get help right away. Go to your nearest emergency department or: Call your local emergency services (911 in the [place] Call a suicide crisis helpline, such as the [facility] at 518-851-0451 or 988 in the [place]. This is open 24 hours a day in the [place] Text the Crisis Text Line at 213 481 9925 (in the [place].). Summary Lewy body dementia is a condition that affects the way  the brain functions. It causes problems with functions such as memory and thinking. Lewy body dementia gets worse with time. This condition is caused by the buildup of proteins called Lewy bodies in brain cells. It is not known what causes the Lewy bodies to build up. Work with your health care provider to determine what you need help with and what your safety needs are. Your health care provider can help direct you to support groups, organizations, and other health care providers who can help with decisions about your care. This information is not intended to replace advice given to you by your health care provider. Make sure you discuss any questions you have with your health care provider. Document Revised: 01/15/2021 Document Reviewed: 11/06/2019 Elsevier Patient Education  [place].

## 2021-12-29 ENCOUNTER — Ambulatory Visit (INDEPENDENT_AMBULATORY_CARE_PROVIDER_SITE_OTHER): Payer: Medicare HMO | Admitting: Internal Medicine

## 2021-12-29 ENCOUNTER — Encounter: Payer: Self-pay | Admitting: Internal Medicine

## 2021-12-29 ENCOUNTER — Ambulatory Visit: Payer: Medicare HMO | Admitting: Family Medicine

## 2021-12-29 DIAGNOSIS — Z6841 Body Mass Index (BMI) 40.0 and over, adult: Secondary | ICD-10-CM

## 2021-12-29 DIAGNOSIS — G4734 Idiopathic sleep related nonobstructive alveolar hypoventilation: Secondary | ICD-10-CM

## 2021-12-29 DIAGNOSIS — G4733 Obstructive sleep apnea (adult) (pediatric): Secondary | ICD-10-CM

## 2021-12-29 NOTE — Assessment & Plan Note (Signed)
She is no longer using oxygen and feels she is sleeping comfortably.  With her body habitus, obesity hypoventilation and/or OSA are issues to watch.

## 2021-12-29 NOTE — Assessment & Plan Note (Signed)
She will likely need external support to accomplish lifestyle changes sufficient to normalize body weight.

## 2021-12-30 ENCOUNTER — Ambulatory Visit: Admit: 2021-12-30 | Payer: Medicare HMO | Admitting: Orthopedic Surgery

## 2021-12-30 SURGERY — ARTHROPLASTY, KNEE, TOTAL
Anesthesia: Choice | Site: Knee | Laterality: Right

## 2022-01-01 ENCOUNTER — Ambulatory Visit (INDEPENDENT_AMBULATORY_CARE_PROVIDER_SITE_OTHER): Payer: Medicare HMO | Admitting: Family Medicine

## 2022-01-01 ENCOUNTER — Encounter: Payer: Self-pay | Admitting: Family Medicine

## 2022-01-01 VITALS — BP 120/86 | HR 72 | Temp 97.7°F | Wt 271.6 lb

## 2022-01-01 DIAGNOSIS — G8929 Other chronic pain: Secondary | ICD-10-CM | POA: Diagnosis not present

## 2022-01-01 DIAGNOSIS — G3183 Dementia with Lewy bodies: Secondary | ICD-10-CM | POA: Diagnosis not present

## 2022-01-01 DIAGNOSIS — F0282 Dementia in other diseases classified elsewhere, unspecified severity, with psychotic disturbance: Secondary | ICD-10-CM

## 2022-01-01 DIAGNOSIS — F250 Schizoaffective disorder, bipolar type: Secondary | ICD-10-CM

## 2022-01-01 DIAGNOSIS — F02A2 Dementia in other diseases classified elsewhere, mild, with psychotic disturbance: Secondary | ICD-10-CM

## 2022-01-01 DIAGNOSIS — M2550 Pain in unspecified joint: Secondary | ICD-10-CM | POA: Diagnosis not present

## 2022-01-01 MED ORDER — TRAMADOL HCL 50 MG PO TABS
50.0000 mg | ORAL_TABLET | Freq: Every evening | ORAL | 0 refills | Status: DC | PRN
Start: 2022-01-01 — End: 2022-01-15

## 2022-01-01 NOTE — Progress Notes (Signed)
leweSubjective:    Patient ID: [name], female    DOB: 1961/02/22, 61 y.o.   MRN: [id number]  Chief Complaint  Patient presents with   Follow-up    On x-ray resul, lab work    HPI Patient was seen today for f/u and review of test results from neurology.  Patient states she had an MRI and lumbar puncture which showed concern for Lewy body dementia.  Patient believed to have a diagnosis.  Patient endorses continued chronic pain from DDD, lumbar radiculopathy.  Patient inquires about medication to help relieve pain.  Seen by rheumatology, Dr. Benjamine Mola however states waiting on neuro before deciding next steps.  Patient planning on moving to [place] with her daughter later in the year.  Past Medical History:  Diagnosis Date   Abdominal pain 09/27/2009   Allergic rhinitis 09/20/2008   Allergy skin test 10/10/08 Allergy Profile 01/18/2013-total IgE 242.4 with broad elevations for common inhalant allergens.    Arthritis    oa   Asthma, moderate persistent 06/04/2009   Allergy Profile 01/18/2013-total IgE 242.4 with broad elevations for common inhalant allergens. PFT 07/05/08- FVC 3.51/ 88%, FEV1 2.38/ 78%, R 0.68, 25-75% 1.45/ 44%, TLC 83%, DLCO 68% PFT 10/21/20- Moderate obstructive airways disease with slight respnse to bronchodilator. Normal Diffusion. FEV!/FVC 0.75    Backache, unspecified    Bipolar I disorder 06/13/2008   Chronic back pain 06/13/2008   Chronic respiratory failure with hypoxia 05/06/2020   Stable with routine use of O2 2L during sleep. Nocturnal hypoxemia with normal daytime oxygenation on room air.    Class 3 severe obesity due to excess calories with serious comorbidity and body mass index (BMI) of 40.0 to 44.9 in adult 12/19/2018   Closed left ankle fracture    DDD (degenerative disc disease), lumbosacral 10/03/2013   Disorder of sacrum 11/03/2013   Dysphagia, pharyngoesophageal phase 09/12/2008   Elevated LFTs 10/15/2010   Female pelvic peritoneal adhesions  12/19/2018   Fibromyalgia    Galactorrhea not associated with childbirth 12/19/2018   Generalized anxiety disorder 04/05/2012   GERD (gastroesophageal reflux disease) 07/05/2008   Heart disease 12/19/2018   Hiatal hernia 11/03/2013   Hyperglycemia 06/13/2008   Hypertension    Intractable migraine with aura 11/03/2013   Lumbar radiculopathy 08/30/2012   Lumbosacral spondylosis 11/03/2013   Major depressive disorder 09/08/2019   Major neurocognitive disorder due to multiple etiologies 08/05/2021   Mixed hyperlipidemia 10/04/2019   On home oxygen therapy    nightly at 2l/m prn   Open left ankle fracture 07/16/2020   Oropharyngeal dysphagia 03/01/2018   OSA (obstructive sleep apnea) 06/13/2008   Pain in pelvis 12/19/2018   PONV (postoperative nausea and vomiting)    Schizoaffective disorder, bipolar type 10/04/2019   Seizure disorder    last seizure 2014   Status post open reduction with internal fixation (ORIF) of fracture of ankle 08/26/2020   L ankle   Thyroid disease 10/03/2013   Vitamin D deficiency 10/04/2019   Vocal cord disorder 04/02/2009   Weakness 09/11/2016    Allergies  Allergen Reactions   Bee Venom Anaphylaxis   Depakote [Divalproex Sodium] Shortness Of Breath and Rash   Haloperidol Lactate Swelling   Hornet Venom Anaphylaxis   Latex Anaphylaxis    mild rash/itching, wheezing/sob   Peanut-Containing Drug Products Anaphylaxis   Penicillins Anaphylaxis and Swelling   Quetiapine Anaphylaxis and Other (See Comments)   Shellfish-Derived Products Anaphylaxis   Valproic Acid Swelling   Amitriptyline Hcl Other (See Comments)  HALLUCINATIONS   Butorphanol Tartrate Other (See Comments)    Hallucinations   Gabapentin Other (See Comments)    Pt reports severe back and side pain   Iodine Other (See Comments)    Flushing and fainting   Pregabalin Rash   Terbutaline Rash    ROS General: Denies fever, chills, night sweats, changes in weight, changes in appetite   + memory changes HEENT: Denies headaches, ear pain, changes in vision, rhinorrhea, sore throat CV: Denies CP, palpitations, SOB, orthopnea Pulm: Denies SOB, cough, wheezing GI: Denies abdominal pain, nausea, vomiting, diarrhea, constipation GU: Denies dysuria, hematuria, frequency, vaginal discharge Msk: Denies muscle cramps, joint pains  + chronic pain Neuro: Denies weakness, numbness, tingling Skin: Denies rashes, bruising Psych: Denies anxiety, hallucinations + depression     Objective:    Blood pressure 120/86, pulse 72, temperature 97.7 F (36.5 C), temperature source Oral, weight 271 lb 9.6 oz (123.2 kg), SpO2 96 %.  Gen. Pleasant, well-nourished, in no distress, normal affect   HEENT: [place]/AT, face symmetric, conjunctiva clear, no scleral icterus, PERRLA, EOMI, nares patent without drainage Lungs: no accessory muscle use, CTAB, no wheezes or rales Cardiovascular: RRR, no m/r/g, no peripheral edema Musculoskeletal: No deformities, no cyanosis or clubbing, normal tone Neuro:  A&Ox3, CN II-XII intact, normal gait Skin:  Warm, no lesions/ rash   Wt Readings from Last 3 Encounters:  01/01/22 271 lb 9.6 oz (123.2 kg)  12/29/21 270 lb 3.2 oz (122.6 kg)  12/23/21 268 lb (121.6 kg)    Lab Results  Component Value Date   WBC 4.4 10/14/2021   HGB 12.9 10/14/2021   HCT 39.4 10/14/2021   PLT 282.0 10/14/2021   GLUCOSE 121 (H) 10/14/2021   CHOL 242 (H) 08/04/2021   TRIG 102.0 08/04/2021   HDL 67.90 08/04/2021   LDLCALC 153 (H) 08/04/2021   ALT 25 10/14/2021   AST 22 10/14/2021   NA 137 10/14/2021   K 4.1 10/14/2021   CL 100 10/14/2021   CREATININE 0.97 10/14/2021   BUN 11 10/14/2021   CO2 26 10/14/2021   TSH 0.54 08/04/2021   INR 0.99 12/25/2011   HGBA1C 6.0 08/04/2021    Assessment/Plan:  Lewy body dementia with psychotic disturbance, unspecified dementia severity ([place]) -MRI brain with without contrast on 10/22/2021 with chronic small vessel ischemia.  No advanced  atrophy. -Lumbar puncture in 5/8 and 5/25 with 14-3-3 protein elevated at 5056.  No growth of bacteria or fungus noted on culture of CSF -Continue follow-up with neurology.  Schizoaffective disorder, bipolar type -Stable -Continue current medications -Continue follow-up psychiatry  Chronic pain of multiple joints  -Continue stretching, heat, massage and topical analgesics for chronic pain. -Consider alternative therapies like water aerobics chair exercises, dry needling, or acupuncture -Limited supply of tramadol given.  If tramadol gives relief will provide additional refill - Plan:traMADol (ULTRAM) 50 MG tablet  F/u in 2-3 months, sooner if needed  Grier Mitts, MD

## 2022-01-01 NOTE — Patient Instructions (Signed)
Remember to contact your endocrinologist and your rheumatologist in regards to scheduling follow-up appointments with them.

## 2022-01-09 ENCOUNTER — Other Ambulatory Visit: Payer: Self-pay | Admitting: Neurology

## 2022-01-09 ENCOUNTER — Other Ambulatory Visit: Payer: Self-pay | Admitting: Family Medicine

## 2022-01-09 DIAGNOSIS — G8929 Other chronic pain: Secondary | ICD-10-CM

## 2022-01-09 DIAGNOSIS — E559 Vitamin D deficiency, unspecified: Secondary | ICD-10-CM

## 2022-01-09 MED ORDER — RIVASTIGMINE TARTRATE 3 MG PO CAPS: 3.0000 mg | ORAL_CAPSULE | Freq: Two times a day (BID) | ORAL | 5 refills | Status: DC

## 2022-01-09 NOTE — Progress Notes (Signed)
Patient advised to D/C.

## 2022-01-09 NOTE — Telephone Encounter (Signed)
Advised patient refill request received for Ex[facility] from [facility]. Patient take 1.5 mg for four weeks and plan to increase.   Per patient right now the medication makes her feel funny. Heart palpitations.

## 2022-01-12 ENCOUNTER — Other Ambulatory Visit: Payer: Self-pay | Admitting: Neurology

## 2022-01-13 ENCOUNTER — Telehealth: Payer: Self-pay | Admitting: Family Medicine

## 2022-01-13 NOTE — Telephone Encounter (Signed)
Left message for patient to call back and schedule Medicare Annual Wellness Visit (AWV) either virtually or in office. Left  my [name] number [phone]   Last AWV ;01/13/21  please schedule at anytime with [facility] Nurse Health Advisor 1 or 2

## 2022-01-14 ENCOUNTER — Telehealth: Payer: Self-pay | Admitting: Family Medicine

## 2022-01-14 NOTE — Telephone Encounter (Signed)
Pt is calling and need a refill on tramadol 50 mg send to  [id number] Phone:  [phone] Fax:  [phone]

## 2022-01-14 NOTE — Telephone Encounter (Signed)
Lov:01/01/2022  Okay for refill?

## 2022-01-16 ENCOUNTER — Encounter: Payer: Self-pay | Admitting: Family Medicine

## 2022-01-19 DIAGNOSIS — M25561 Pain in right knee: Secondary | ICD-10-CM | POA: Diagnosis not present

## 2022-02-02 ENCOUNTER — Telehealth: Payer: Self-pay | Admitting: Family Medicine

## 2022-02-02 NOTE — Telephone Encounter (Signed)
Pt is calling and she has dementia and lost her medications . Pt had about 12 pill of traMADol (ULTRAM) 50 MG tablet left and also needs metoprolol succinate (TOPROL-XL) 50 MG 24 hr tablet  [id number] [place] [facility] [address] Phone:  [phone] Fax:  [phone]

## 2022-02-04 ENCOUNTER — Ambulatory Visit: Payer: Medicare HMO | Admitting: Neurology

## 2022-02-04 ENCOUNTER — Telehealth: Payer: Self-pay | Admitting: Family Medicine

## 2022-02-04 NOTE — Telephone Encounter (Signed)
Pt called very flustered and upset and wants her medication ASAP!! Pt states its been 5 days and she needs them now.  Pt is requesting the following refills:  benztropine (COGENTIN) 0.5 MG tablet hydrOXYzine (ATARAX/VISTARIL) 25 MG tablet lamoTRIgine (LAMICTAL) 200 MG tablet metoprolol succinate (TOPROL-XL) 50 MG 24 hr tablet  traMADol (ULTRAM) 50 MG tablet  mirtazapine (REMERON) 15 MG tablet ondansetron (ZOFRAN) 4 MG tablet   [id number] [place] [facility] [address] Phone:  [phone] Fax:  [phone]

## 2022-02-10 ENCOUNTER — Telehealth: Payer: Self-pay | Admitting: Family Medicine

## 2022-02-10 NOTE — Telephone Encounter (Signed)
Left message for patient to call back and schedule Medicare Annual Wellness Visit (AWV) either virtually or in office. Left  my [name] number [phone]   Last AWV ;01/13/21  please schedule at anytime with [facility] Nurse Health Advisor 1 or 2

## 2022-02-10 NOTE — Telephone Encounter (Signed)
Per patient chart, Dr Volanda Napoleon has recently refilled the metoprolol and the tramadol. These 2 are the the only 2 medications Dr Volanda Napoleon has sent in, she sees someone else for the others per Dr Volanda Napoleon. Not able to refill the other medications.

## 2022-02-11 DIAGNOSIS — G35 Multiple sclerosis: Secondary | ICD-10-CM | POA: Diagnosis not present

## 2022-02-11 DIAGNOSIS — Z961 Presence of intraocular lens: Secondary | ICD-10-CM | POA: Diagnosis not present

## 2022-02-11 DIAGNOSIS — H04123 Dry eye syndrome of bilateral lacrimal glands: Secondary | ICD-10-CM | POA: Diagnosis not present

## 2022-02-11 DIAGNOSIS — H524 Presbyopia: Secondary | ICD-10-CM | POA: Diagnosis not present

## 2022-02-13 ENCOUNTER — Ambulatory Visit: Payer: Self-pay

## 2022-02-13 NOTE — Patient Outreach (Signed)
  Care Coordination   02/13/2022 Name: [name] MRN: [id number] DOB: 13-Mar-1961   Care Coordination Outreach Attempts:  An unsuccessful telephone outreach was attempted today to offer the patient information about available care coordination services as a benefit of their health plan.   Follow Up Plan:  Additional outreach attempts will be made to offer the patient care coordination information and services.   Encounter Outcome:  No Answer  Care Coordination Interventions Activated:  No   Care Coordination Interventions:  No, not indicated    Daneen Schick, BSW, CDP Social Worker, Certified Dementia Practitioner Care Coordination [phone]

## 2022-02-18 ENCOUNTER — Telehealth: Payer: Self-pay

## 2022-02-18 NOTE — Telephone Encounter (Signed)
Contacted patient on preferred number listed in notes for scheduled AWV. Patient stated she would like to reschedule.

## 2022-02-19 ENCOUNTER — Ambulatory Visit: Payer: Self-pay

## 2022-02-19 NOTE — Patient Outreach (Signed)
  Care Coordination   02/19/2022 Name: [name] MRN: [id number] DOB: 01/23/1961   Care Coordination Outreach Attempts:  A second unsuccessful outreach was attempted today to offer the patient with information about available care coordination services as a benefit of their health plan.     Follow Up Plan:  Additional outreach attempts will be made to offer the patient care coordination information and services.   Encounter Outcome:  No Answer  Care Coordination Interventions Activated:  No   Care Coordination Interventions:  No, not indicated    Daneen Schick, BSW, CDP Social Worker, Certified Dementia Practitioner Care Coordination [phone]

## 2022-02-20 ENCOUNTER — Other Ambulatory Visit: Payer: Self-pay | Admitting: Family Medicine

## 2022-02-20 ENCOUNTER — Other Ambulatory Visit: Payer: Self-pay | Admitting: Neurology

## 2022-02-20 DIAGNOSIS — G8929 Other chronic pain: Secondary | ICD-10-CM

## 2022-02-23 ENCOUNTER — Other Ambulatory Visit: Payer: Self-pay | Admitting: *Deleted

## 2022-02-23 ENCOUNTER — Other Ambulatory Visit: Payer: Self-pay

## 2022-02-23 DIAGNOSIS — I10 Essential (primary) hypertension: Secondary | ICD-10-CM

## 2022-02-23 MED ORDER — ALBUTEROL SULFATE HFA 108 (90 BASE) MCG/ACT IN AERS: 2.0000 | INHALATION_SPRAY | Freq: Four times a day (QID) | RESPIRATORY_TRACT | 3 refills | Status: DC | PRN

## 2022-02-23 MED ORDER — METOPROLOL SUCCINATE ER 50 MG PO TB24: ORAL_TABLET | ORAL | 1 refills | Status: DC

## 2022-02-24 ENCOUNTER — Telehealth: Payer: Self-pay | Admitting: Family Medicine

## 2022-02-24 NOTE — Telephone Encounter (Signed)
Error/njr °

## 2022-02-25 ENCOUNTER — Other Ambulatory Visit: Payer: Self-pay | Admitting: *Deleted

## 2022-02-25 NOTE — Patient Outreach (Signed)
  Care Coordination   02/25/2022 Name: Olamae Ferrara MRN: [id number] DOB: 10-26-1960   Care Coordination Outreach Attempts:  A third unsuccessful outreach was attempted today to offer the patient with information about available care coordination services as a benefit of their health plan.   Follow Up Plan:  No further outreach attempts will be made at this time. We have been unable to contact the patient to offer or enroll patient in care coordination services  Encounter Outcome:  No Answer  Care Coordination Interventions Activated:  No   Care Coordination Interventions:  No, not indicated    Raina Mina, RN Care Management Coordinator [place] Office [phone]

## 2022-02-26 NOTE — Progress Notes (Signed)
Office Visit Note  Patient: [name]             Date of Birth: [birth year]           MRN: [id number]             PCP: Billie Ruddy, MD Referring: Billie Ruddy, MD Visit Date: 03/03/2022   Subjective:  Follow-up (Follow-up after seeing neurologist. Joint pain and swelling.)   History of Present Illness: [name] is a 61 y.o. female here for follow up for evaluation of joint pain and swelling with abnormal lab results.  Since our initial visit she was able to follow-up with neurology and additional studies including CSF analysis combined with her symptoms apparently consistent with mild Lewy body dementia.  Her neuroimaging and fluid analysis did not indicate evidence of current multiple sclerosis.  She does continue having joint pain and stiffness in multiple areas particularly bad at this time in her shoulder.  She is using a cane for assistive device due to the knee pain and also her worsened balance instability.   Previous HPI 11/06/2021 [name] is a 61 y.o. female here for evaluation of joint pain and swelling with abnormal lab results. She has a history of joint pains and fibromyalgia but reportedly increase shoulder and wrist pain and some swelling involved.  These left arm symptoms and increase in joint swelling have been going on for at least 6 months.  She sustained left shoulder injury when falling and breaking her ankle that have MRI in March showing multiple abnormalities with rotator cuff tear tendinopathy moderate degenerative changes and some bursitis.  She reports treatment with intra-articular steroid injection that did not give an appreciable benefit.  She worked with physical therapy at the time but was mostly focused on her ankle and mobility.  She takes Tylenol and ibuprofen which are partially helpful.  He is trying to limit use of the ibuprofen on account of medical problems including CKD stage III.  She sees Dr. Percell Miller for orthopedic  treatment reports planning right knee arthroplasty for advanced osteoarthritis. Her history is significant for newly identified major cognitive disorder for which she has ongoing neurology follow up.  This includes considerable memory and concentration difficulty, also having tremors and reports weakness coming and going.  She has numbness in the left hand also worsening during this time symptoms are most severe at nighttime.  Left wrist swelling has been present intermittently with worsening of associated symptoms wrist pain and hand numbness at that time.  Today the swelling is doing pretty well.  Her work-up so far shown some lesions on brain MRI imaging without definitive diagnosis so far.  She had inflammatory markers elevated with high white blood cell count complement C4 and sedimentation rate test.  Specific antibody tests for ANA were all negative.     Labs reviewed 10/2021 ANA neg ESR 70 CRP 1.3   07/2021 Complement C4 62 ANA neg dsDNA, parietal cell, RNP, SM, ASMA, AMA, Scl-70, SSA, SSB, TPO Abs neg   Imaging reviewed 09/29/20 MRI left shoulder IMPRESSION: 1. Partial thickness articular surface tear of the distal anterior supraspinatus tendon with moderate supraspinatus tendinopathy. 2. Mild infraspinatus and subscapularis tendinopathy. 3. Moderate degenerative glenohumeral arthropathy with moderate humeral head spurring and a central non-fragmented osteochondral lesion along the glenoid. Degenerative subcortical cystic lesion along the upper lateral margin of the humeral head. 4. Trace subacromial subdeltoid bursitis.   Activities of Daily Living:  Patient reports morning  stiffness for 1 hour.   Patient Reports nocturnal pain.  Difficulty dressing/grooming: Reports Difficulty climbing stairs: Reports Difficulty getting out of chair: Reports Difficulty using hands for taps, buttons, cutlery, and/or writing: Reports   Review of Systems  Constitutional:  Positive for fatigue.   HENT:  Positive for mouth sores and mouth dryness.   Respiratory:  Positive for shortness of breath.   Cardiovascular:  Positive for chest pain and palpitations.  Gastrointestinal:  Negative for blood in stool, constipation and diarrhea.  Endocrine: Positive for increased urination.  Genitourinary:  Negative for involuntary urination.  Musculoskeletal:  Positive for joint pain, gait problem, joint pain, joint swelling, myalgias, muscle weakness, morning stiffness, muscle tenderness and myalgias.  Skin:  Positive for sensitivity to sunlight. Negative for color change, rash and hair loss.  Allergic/Immunologic: Positive for susceptible to infections.  Neurological:  Positive for headaches. Negative for dizziness.  Hematological:  Negative for swollen glands.  Psychiatric/Behavioral:  Positive for depressed mood and sleep disturbance. The patient is nervous/anxious.     PMFS History:  Patient Active Problem List   Diagnosis Date Noted   Pain in right shoulder 03/10/2022   Unilateral primary osteoarthritis, right knee 03/10/2022   Sedimentation rate elevation 11/06/2021   Nocturnal hypoxia 08/14/2021   Anaphylaxis 08/14/2021   Preoperative respiratory examination 08/14/2021   Major neurocognitive disorder due to multiple etiologies 08/05/2021   Chronic respiratory failure with hypoxia 05/06/2020   Schizoaffective disorder, bipolar type 10/04/2019   Mixed hyperlipidemia 10/04/2019   Vitamin D deficiency 10/04/2019   Major depressive disorder    Female pelvic peritoneal adhesions 12/19/2018   Galactorrhea not associated with childbirth 12/19/2018   Heart disease 12/19/2018   Class 3 severe obesity due to excess calories with serious comorbidity and body mass index (BMI) of 40.0 to 44.9 in adult 12/19/2018   Pain in pelvis 12/19/2018   Oropharyngeal dysphagia 03/01/2018   Weakness 09/11/2016   Seizure disorder ([place]) 12/30/2013   Disorder of sacrum 11/03/2013   Hiatal hernia  11/03/2013   Hypertension 11/03/2013   Intractable migraine with aura 11/03/2013   Lumbosacral spondylosis 11/03/2013   Fibromyalgia 11/03/2013   DDD (degenerative disc disease), lumbosacral 10/03/2013   Thyroid disease 10/03/2013   Lumbar radiculopathy 08/30/2012   Generalized anxiety disorder 04/05/2012   Elevated LFTs 10/15/2010   Abdominal pain 09/27/2009   Asthma, moderate persistent 06/04/2009   Vocal cord disorder 04/02/2009   Personal history of allergy to seafood 10/10/2008   Allergic rhinitis 09/20/2008   Dysphagia, pharyngoesophageal phase 09/12/2008   GERD (gastroesophageal reflux disease) 07/05/2008   Bipolar I disorder ([place]) 06/13/2008   OSA (obstructive sleep apnea) 06/13/2008   Arthritis 06/13/2008   Chronic back pain 06/13/2008   Hyperglycemia 06/13/2008    Past Medical History:  Diagnosis Date   Abdominal pain 09/27/2009   Allergic rhinitis 09/20/2008   Allergy skin test 10/10/08 Allergy Profile 01/18/2013-total IgE 242.4 with broad elevations for common inhalant allergens.    Arthritis    oa   Asthma, moderate persistent 06/04/2009   Allergy Profile 01/18/2013-total IgE 242.4 with broad elevations for common inhalant allergens. PFT 07/05/08- FVC 3.51/ 88%, FEV1 2.38/ 78%, R 0.68, 25-75% 1.45/ 44%, TLC 83%, DLCO 68% PFT 10/21/20- Moderate obstructive airways disease with slight respnse to bronchodilator. Normal Diffusion. FEV!/FVC 0.75    Backache, unspecified    Bipolar I disorder 06/13/2008   Chronic back pain 06/13/2008   Chronic respiratory failure with hypoxia 05/06/2020   Stable with routine use of O2 2L during  sleep. Nocturnal hypoxemia with normal daytime oxygenation on room air.    Class 3 severe obesity due to excess calories with serious comorbidity and body mass index (BMI) of 40.0 to 44.9 in adult 12/19/2018   Closed left ankle fracture    DDD (degenerative disc disease), lumbosacral 10/03/2013   Disorder of sacrum 11/03/2013   Dysphagia,  pharyngoesophageal phase 09/12/2008   Elevated LFTs 10/15/2010   Female pelvic peritoneal adhesions 12/19/2018   Fibromyalgia    Galactorrhea not associated with childbirth 12/19/2018   Generalized anxiety disorder 04/05/2012   GERD (gastroesophageal reflux disease) 07/05/2008   Heart disease 12/19/2018   Hiatal hernia 11/03/2013   Hyperglycemia 06/13/2008   Hypertension    Intractable migraine with aura 11/03/2013   LBD (Lewy body dementia) ([place])    Lumbar radiculopathy 08/30/2012   Lumbosacral spondylosis 11/03/2013   Major depressive disorder 09/08/2019   Major neurocognitive disorder due to multiple etiologies 08/05/2021   Mixed hyperlipidemia 10/04/2019   On home oxygen therapy    nightly at 2l/m prn   Open left ankle fracture 07/16/2020   Oropharyngeal dysphagia 03/01/2018   OSA (obstructive sleep apnea) 06/13/2008   Pain in pelvis 12/19/2018   PONV (postoperative nausea and vomiting)    Schizoaffective disorder, bipolar type 10/04/2019   Seizure disorder    last seizure 2014   Status post open reduction with internal fixation (ORIF) of fracture of ankle 08/26/2020   L ankle   Thyroid disease 10/03/2013   Vitamin D deficiency 10/04/2019   Vocal cord disorder 04/02/2009   Weakness 09/11/2016    Family History  Problem Relation Age of Onset   Heart disease Father    Ovarian cancer Mother    Breast cancer Mother    Alzheimer's disease Sister 15   Colon cancer Neg Hx    Esophageal cancer Neg Hx    Stomach cancer Neg Hx    Rectal cancer Neg Hx    Past Surgical History:  Procedure Laterality Date   APPENDECTOMY  1988   laparoscopic   BREAST LUMPECTOMY WITH NEEDLE LOCALIZATION Left 06/06/2015   Procedure: LEFT BREAST LUMPECTOMY WITH NEEDLE LOCALIZATION TIMES TWO;  Surgeon: Erroll Luna, MD;  Location: [facility];  Service: General;  Laterality: Left;   CESAREAN SECTION  1988   twins   CHOLECYSTECTOMY  yrs ago   COLONOSCOPY N/A 06/12/2013    Procedure: COLONOSCOPY;  Surgeon: Irene Shipper, MD;  Location: [facility];  Service: Endoscopy;  Laterality: N/A;   HERNIA REPAIR  yrs ago   umbilical   ORIF ANKLE FRACTURE Left 07/16/2020   Procedure: OPEN REDUCTION INTERNAL FIXATION (ORIF) ANKLE FRACTURE;  Surgeon: Renette Butters, MD;  Location: [facility];  Service: Orthopedics;  Laterality: Left;   TUBAL LIGATION  1988   VAGINAL HYSTERECTOMY  2005   complete   Social History   Social History Narrative   Single, 3 children   Right handed   Associates degree   occ 1 cup daily      09/01/2018: Lives with sister, Rise Paganini, who helps manage medications, in 2 story house, and a pit bull   Has one daughter in [place], 2 sons in [place]; 7 grandchildren   Currently not exercising, but wants to return to water aerobics at [employer] History  Administered Date(s) Administered   Influenza Split 04/06/2012, 03/06/2013   Influenza Whole 04/08/2009, 04/05/2010   Influenza,inj,Quad PF,6+ Mos 03/22/2013, 09/01/2018, 04/24/2020   PFIZER(Purple Top)SARS-COV-2 Vaccination 09/30/2019, 10/21/2019  Pneumococcal Polysaccharide-23 04/15/2009, 07/07/2011, 04/06/2012     Objective: Vital Signs: BP 113/78 (BP Location: Left Arm, Patient Position: Sitting, Cuff Size: Large)   Pulse 96   Resp 16   Ht 5' 9" (1.753 m)   Wt 265 lb 6.4 oz (120.4 kg)   BMI 39.19 kg/m    Physical Exam Constitutional:      Appearance: She is obese.  Cardiovascular:     Rate and Rhythm: Normal rate and regular rhythm.  Pulmonary:     Effort: Pulmonary effort is normal.     Breath sounds: Normal breath sounds.  Musculoskeletal:     Right lower leg: Edema present.     Left lower leg: Edema present.  Skin:    General: Skin is warm and dry.  Neurological:     Mental Status: She is alert.      Musculoskeletal Exam:  Shoulders right shoulder range of motion is intact but tenderness to pressure and with overhead abduction, Elbows full ROM no  tenderness or swelling Wrists full ROM no tenderness or swelling Fingers full ROM no tenderness or swelling Right knee tenderness to pressure anterior and laterally along joint line and patellofemoral crepitus present no palpable effusion   Investigation: No additional findings.  Imaging: No results found.  Recent Labs: Lab Results  Component Value Date   WBC 4.4 10/14/2021   HGB 12.9 10/14/2021   PLT 282.0 10/14/2021   NA 137 10/14/2021   K 4.1 10/14/2021   CL 100 10/14/2021   CO2 26 10/14/2021   GLUCOSE 121 (H) 10/14/2021   BUN 11 10/14/2021   CREATININE 0.97 10/14/2021   BILITOT 0.3 10/14/2021   ALKPHOS 60 10/14/2021   AST 22 10/14/2021   ALT 25 10/14/2021   PROT 7.1 10/14/2021   ALBUMIN 3.7 11/10/2021   CALCIUM 9.4 10/14/2021   GFRAA 64 04/24/2020    Speciality Comments: No specialty comments available.  Procedures:  Large Joint Inj: R subacromial bursa on 03/03/2022 3:00 PM Indications: pain Details: 25 G 1.5 in needle, lateral approach Medications: 3 mL lidocaine 1 %; 40 mg triamcinolone acetonide 40 MG/ML Outcome: tolerated well, no immediate complications Consent was given by the patient. Immediately prior to procedure a time out was called to verify the correct patient, procedure, equipment, support staff and site/side marked as required. Patient was prepped and draped in the usual sterile fashion.     Allergies: Bee venom, Depakote [divalproex sodium], Haloperidol lactate, Hornet venom, Latex, Peanut-containing drug products, Penicillins, Quetiapine, Shellfish-derived products, Valproic acid, Amitriptyline hcl, Butorphanol tartrate, Gabapentin, Iodine, Pregabalin, and Terbutaline   Assessment / Plan:     Visit Diagnoses: Chronic right shoulder pain  Suspect current shoulder pains in exacerbation may be relying more on use of cane and upper body support due to knee pains or neurologic changes.  Not safe to stay on high-dose NSAIDs.  Would defer adding any  CNS active medicines for pain management to neurology or if needed a pain management specialist.  Intra-articular steroid injection for suspected exacerbation of chronic rotator cuff tendinosis.  Unilateral primary osteoarthritis, right knee    Orders: No orders of the defined types were placed in this encounter.  No orders of the defined types were placed in this encounter.    Follow-Up Instructions: Return in about 3 months (around 06/03/2022), or if symptoms worsen or fail to improve.   Collier Salina, MD  Note - This record has been created using [employer].  Chart creation errors have been sought,  but may not always  have been located. Such creation errors do not reflect on  the standard of medical care.

## 2022-03-02 ENCOUNTER — Ambulatory Visit: Payer: Medicare Other | Admitting: Family Medicine

## 2022-03-03 ENCOUNTER — Encounter: Payer: Self-pay | Admitting: Internal Medicine

## 2022-03-03 ENCOUNTER — Ambulatory Visit: Payer: Medicare HMO | Attending: Internal Medicine | Admitting: Internal Medicine

## 2022-03-03 VITALS — BP 113/78 | HR 96 | Resp 16 | Ht 69.0 in | Wt 265.4 lb

## 2022-03-03 DIAGNOSIS — M25511 Pain in right shoulder: Secondary | ICD-10-CM

## 2022-03-03 DIAGNOSIS — G8929 Other chronic pain: Secondary | ICD-10-CM

## 2022-03-03 DIAGNOSIS — M1711 Unilateral primary osteoarthritis, right knee: Secondary | ICD-10-CM | POA: Diagnosis not present

## 2022-03-10 DIAGNOSIS — M1711 Unilateral primary osteoarthritis, right knee: Secondary | ICD-10-CM | POA: Insufficient documentation

## 2022-03-10 DIAGNOSIS — M25511 Pain in right shoulder: Secondary | ICD-10-CM | POA: Insufficient documentation

## 2022-03-10 MED ORDER — LIDOCAINE HCL 1 % IJ SOLN
3.0000 mL | INTRAMUSCULAR | Status: AC | PRN
  Administered 2022-03-03: 3 mL

## 2022-03-10 MED ORDER — TRIAMCINOLONE ACETONIDE 40 MG/ML IJ SUSP
40.0000 mg | INTRAMUSCULAR | Status: AC | PRN
  Administered 2022-03-03: 40 mg via INTRA_ARTICULAR

## 2022-03-12 ENCOUNTER — Other Ambulatory Visit: Payer: Self-pay | Admitting: Family Medicine

## 2022-03-12 ENCOUNTER — Ambulatory Visit (INDEPENDENT_AMBULATORY_CARE_PROVIDER_SITE_OTHER): Payer: Medicare HMO

## 2022-03-12 ENCOUNTER — Other Ambulatory Visit: Payer: Self-pay | Admitting: Neurology

## 2022-03-12 VITALS — Ht 69.0 in | Wt 250.0 lb

## 2022-03-12 DIAGNOSIS — Z0000 Encounter for general adult medical examination without abnormal findings: Secondary | ICD-10-CM

## 2022-03-12 DIAGNOSIS — G8929 Other chronic pain: Secondary | ICD-10-CM

## 2022-03-12 NOTE — Progress Notes (Signed)
Subjective:   [name] is a 61 y.o. female who presents for Medicare Annual (Subsequent) preventive examination.  Review of Systems    Virtual Visit via Telephone Note  I connected with  [name] on 03/12/22 at  9:15 AM EDT by telephone and verified that I am speaking with the correct person using two identifiers.  Location: Patient: Home Provider: Office Persons participating in the virtual visit: patient/Nurse Health Advisor   I discussed the limitations, risks, security and privacy concerns of performing an evaluation and management service by telephone and the availability of in person appointments. The patient expressed understanding and agreed to proceed.  Interactive audio and video telecommunications were attempted between this nurse and patient, however failed, due to patient having technical difficulties OR patient did not have access to video capability.  We continued and completed visit with audio only.  Some vital signs may be absent or patient reported.   Criselda Peaches, LPN  Cardiac Risk Factors include: advanced age (>60mn, >>69women);hypertension     Objective:    Today's Vitals   03/12/22 0918  Weight: 250 lb (113.4 kg)   Body mass index is 36.92 kg/m.     03/12/2022    9:35 AM 10/14/2021    8:39 AM 01/13/2021    1:59 PM 07/16/2020    5:13 PM 07/16/2020    7:08 AM 04/12/2020    6:51 AM 01/23/2020    9:00 AM  Advanced Directives  Does Patient Have a Medical Advance Directive? No Yes Yes No No No No  Type of AScientist, physiologicalof AParnellLiving will      Copy of HThayerin Chart?   No - copy requested      Would patient like information on creating a medical advance directive? No - Patient declined   No - Patient declined No - Patient declined No - Patient declined No - Patient declined    Current Medications (verified) Outpatient Encounter Medications as of 03/12/2022  Medication Sig    acetaminophen (TYLENOL) 500 MG tablet Take 1,000 mg by mouth every 6 (six) hours as needed for moderate pain.   albuterol (PROVENTIL) (2.5 MG/3ML) 0.083% nebulizer solution Take 3 mLs by nebulization every 4 (four) hours as needed for wheezing or shortness of breath.   albuterol (VENTOLIN HFA) 108 (90 Base) MCG/ACT inhaler Inhale 2 puffs into the lungs every 6 (six) hours as needed for wheezing or shortness of breath.   aspirin EC 81 MG EC tablet Take 1 tablet (81 mg total) by mouth 2 (two) times daily. Swallow whole.   benztropine (COGENTIN) 0.5 MG tablet Take 0.5 mg by mouth daily.    EPINEPHrine 0.3 mg/0.3 mL IJ SOAJ injection Inject 0.3 mg into the muscle daily as needed for anaphylaxis.   famotidine (PEPCID) 20 MG tablet Take 1 tablet (20 mg total) by mouth 2 (two) times daily. For stomach acid. (Patient taking differently: Take 20 mg by mouth daily.)   FLUoxetine (PROZAC) 40 MG capsule Take 80 mg by mouth daily.   Fluticasone-Umeclidin-Vilant (TRELEGY ELLIPTA) 100-62.5-25 MCG/ACT AEPB Inhale 1 puff into the lungs daily.   hydrOXYzine (ATARAX/VISTARIL) 25 MG tablet Take 25 mg by mouth 4 (four) times daily as needed for anxiety or itching.   lamoTRIgine (LAMICTAL) 100 MG tablet    lamoTRIgine (LAMICTAL) 200 MG tablet Take 200 mg by mouth at bedtime.   lurasidone (LATUDA) 20 MG TABS tablet daily.   metoprolol succinate (  TOPROL-XL) 50 MG 24 hr tablet TAKE 1 TABLET BY MOUTH EVERY DAY TAKE WITH OR IMMEDIATELY FOLLOWING A MEAL.   mirtazapine (REMERON) 15 MG tablet Take 15 mg by mouth at bedtime.   Multiple Vitamin (MULTIVITAMIN WITH MINERALS) TABS Take 1 tablet by mouth daily. For nutritional supplementation. (Patient taking differently: Take 1 tablet by mouth daily.)   ondansetron (ZOFRAN) 4 MG tablet Take 1 tablet (4 mg total) by mouth every 6 (six) hours as needed for nausea.   polyethylene glycol powder (GLYCOLAX/MIRALAX) 17 GM/SCOOP powder Take 17 g by mouth daily as needed.   RESTASIS 0.05 %  ophthalmic emulsion 1 drop 2 (two) times daily.   rivastigmine (EXELON) 3 MG capsule Take 1 capsule (3 mg total) by mouth 2 (two) times daily. (Patient not taking: Reported on 03/03/2022)   tiZANidine (ZANAFLEX) 4 MG tablet Take 4 mg by mouth 3 (three) times daily as needed for muscle spasms.   topiramate (TOPAMAX) 50 MG tablet as needed.   traMADol (ULTRAM) 50 MG tablet Take 1 tablet (50 mg total) by mouth at bedtime as needed.   traZODone (DESYREL) 100 MG tablet as needed.   VITAMIN D PO daily.   ziprasidone (GEODON) 20 MG capsule Take 20 mg by mouth 2 (two) times daily with a meal.   No facility-administered encounter medications on file as of 03/12/2022.    Allergies (verified) Bee venom, Depakote [divalproex sodium], Haloperidol lactate, Hornet venom, Latex, Peanut-containing drug products, Penicillins, Quetiapine, Shellfish-derived products, Valproic acid, Amitriptyline hcl, Butorphanol tartrate, Gabapentin, Iodine, Pregabalin, and Terbutaline   History: Past Medical History:  Diagnosis Date   Abdominal pain 09/27/2009   Allergic rhinitis 09/20/2008   Allergy skin test 10/10/08 Allergy Profile 01/18/2013-total IgE 242.4 with broad elevations for common inhalant allergens.    Arthritis    oa   Asthma, moderate persistent 06/04/2009   Allergy Profile 01/18/2013-total IgE 242.4 with broad elevations for common inhalant allergens. PFT 07/05/08- FVC 3.51/ 88%, FEV1 2.38/ 78%, R 0.68, 25-75% 1.45/ 44%, TLC 83%, DLCO 68% PFT 10/21/20- Moderate obstructive airways disease with slight respnse to bronchodilator. Normal Diffusion. FEV!/FVC 0.75    Backache, unspecified    Bipolar I disorder 06/13/2008   Chronic back pain 06/13/2008   Chronic respiratory failure with hypoxia 05/06/2020   Stable with routine use of O2 2L during sleep. Nocturnal hypoxemia with normal daytime oxygenation on room air.    Class 3 severe obesity due to excess calories with serious comorbidity and body mass index (BMI) of  40.0 to 44.9 in adult 12/19/2018   Closed left ankle fracture    DDD (degenerative disc disease), lumbosacral 10/03/2013   Disorder of sacrum 11/03/2013   Dysphagia, pharyngoesophageal phase 09/12/2008   Elevated LFTs 10/15/2010   Female pelvic peritoneal adhesions 12/19/2018   Fibromyalgia    Galactorrhea not associated with childbirth 12/19/2018   Generalized anxiety disorder 04/05/2012   GERD (gastroesophageal reflux disease) 07/05/2008   Heart disease 12/19/2018   Hiatal hernia 11/03/2013   Hyperglycemia 06/13/2008   Hypertension    Intractable migraine with aura 11/03/2013   LBD (Lewy body dementia) ([place])    Lumbar radiculopathy 08/30/2012   Lumbosacral spondylosis 11/03/2013   Major depressive disorder 09/08/2019   Major neurocognitive disorder due to multiple etiologies 08/05/2021   Mixed hyperlipidemia 10/04/2019   On home oxygen therapy    nightly at 2l/m prn   Open left ankle fracture 07/16/2020   Oropharyngeal dysphagia 03/01/2018   OSA (obstructive sleep apnea) 06/13/2008   Pain in  pelvis 12/19/2018   PONV (postoperative nausea and vomiting)    Schizoaffective disorder, bipolar type 10/04/2019   Seizure disorder    last seizure 2014   Status post open reduction with internal fixation (ORIF) of fracture of ankle 08/26/2020   L ankle   Thyroid disease 10/03/2013   Vitamin D deficiency 10/04/2019   Vocal cord disorder 04/02/2009   Weakness 09/11/2016   Past Surgical History:  Procedure Laterality Date   APPENDECTOMY  1988   laparoscopic   BREAST LUMPECTOMY WITH NEEDLE LOCALIZATION Left 06/06/2015   Procedure: LEFT BREAST LUMPECTOMY WITH NEEDLE LOCALIZATION TIMES TWO;  Surgeon: Erroll Luna, MD;  Location: [facility];  Service: General;  Laterality: Left;   [place]   twins   CHOLECYSTECTOMY  yrs ago   COLONOSCOPY N/A 06/12/2013   Procedure: COLONOSCOPY;  Surgeon: Irene Shipper, MD;  Location: [facility];  Service: Endoscopy;   Laterality: N/A;   HERNIA REPAIR  yrs ago   umbilical   ORIF ANKLE FRACTURE Left 07/16/2020   Procedure: OPEN REDUCTION INTERNAL FIXATION (ORIF) ANKLE FRACTURE;  Surgeon: Renette Butters, MD;  Location: [facility];  Service: Orthopedics;  Laterality: Left;   TUBAL LIGATION  1988   VAGINAL HYSTERECTOMY  2005   complete   Family History  Problem Relation Age of Onset   Heart disease Father    Ovarian cancer Mother    Breast cancer Mother    Alzheimer's disease Sister 50   Colon cancer Neg Hx    Esophageal cancer Neg Hx    Stomach cancer Neg Hx    Rectal cancer Neg Hx    Social History   Socioeconomic History   Marital status: Divorced    Spouse name: Not on file   Number of children: 3   Years of education: 14   Highest education level: Some college, no degree  Occupational History   Occupation: Disabled  Tobacco Use   Smoking status: Former    Packs/day: 1.00    Years: 5.00    Total pack years: 5.00    Types: Cigarettes    Quit date: 07/07/2007    Years since quitting: 14.6    Passive exposure: Never   Smokeless tobacco: Never  Vaping Use   Vaping Use: Never used  Substance and Sexual Activity   Alcohol use: No   Drug use: No   Sexual activity: Not Currently    Birth control/protection: Surgical  Other Topics Concern   Not on file  Social History Narrative   Single, 3 children   Right handed   Associates degree   occ 1 cup daily      09/01/2018: Lives with sister, Rise Paganini, who helps manage medications, in 2 story house, and a pit bull   Has one daughter in [place], 2 sons in [place]; 7 grandchildren   Currently not exercising, but wants to return to water aerobics at [employer] [facility]   Financial Resource Strain: [employer]  (03/12/2022)   Overall Financial Resource Strain (CARDIA)    Difficulty of Paying Living Expenses: Not hard at all  Food Insecurity: No Food Insecurity (03/12/2022)   Hunger Vital Sign    Worried About Running Out  of Food in the Last Year: Never true    [place] in the Last Year: Never true  Transportation Needs: No Transportation Needs (01/13/2021)   PRAPARE - Hydrologist (Medical): No  Lack of Transportation (Non-Medical): No  Physical Activity: Insufficiently Active (03/12/2022)   Exercise Vital Sign    Days of Exercise per Week: 5 days    Minutes of Exercise per Session: 20 min  Stress: Stress Concern Present (03/12/2022)   [place]    Feeling of Stress : To some extent  Social Connections: Moderately Integrated (03/12/2022)   Social Connection and Isolation Panel [NHANES]    Frequency of Communication with Friends and Family: More than three times a week    Frequency of Social Gatherings with Friends and Family: More than three times a week    Attends Religious Services: More than 4 times per year    Active Member of [employer] or Organizations: Yes    Attends Music therapist: More than 4 times per year    Marital Status: Divorced    Tobacco Counseling Counseling given: Not Answered   Clinical Intake:  Pre-visit preparation completed: No  Pain : No/denies pain     BMI - recorded: 36.92 Nutritional Status: BMI > 30  Obese Nutritional Risks: None Diabetes: No  How often do you need to have someone help you when you read instructions, pamphlets, or other written materials from your doctor or pharmacy?: 5 - Always (Sister assist)  Diabetic?  No  Interpreter Needed?: No  Information entered by :: Rolene Arbour LPN   Activities of Daily Living    03/12/2022    9:29 AM  In your present state of health, do you have any difficulty performing the following activities:  Hearing? 0  Vision? 0  Difficulty concentrating or making decisions? 0  Walking or climbing stairs? 1  Comment Use Cane. Walker and Education officer, environmental or bathing? 1  Comment Sister assist  Doing  errands, shopping? 1  Comment [place] and eating ? N  Using the Toilet? N  In the past six months, have you accidently leaked urine? N  Do you have problems with loss of bowel control? N  Managing your Medications? Y  Comment Sister assist  Managing your Finances? Y  Comment Family assist  Housekeeping or managing your Housekeeping? Y  Comment Sister assist    Patient Care Team: Billie Ruddy, MD as PCP - General (Family Medicine) Ena Dawley, MD as Consulting Physician (Obstetrics and Gynecology) Pieter Partridge, DO as Consulting Physician (Neurology) [facility] [place] (Orthopedic Surgery)  Indicate any recent Medical Services you may have received from other than [facility] providers in the past year (date may be approximate).     Assessment:   This is a routine wellness examination for [place].  Hearing/Vision screen Hearing Screening - Comments:: Denies hearing difficulties   Vision Screening - Comments:: Wears rx glasses - up to date with routine eye exams with  Dr Herbert Deaner  Dietary issues and exercise activities discussed: Current Exercise Habits: Home exercise routine, Type of exercise: stretching;walking, Time (Minutes): 20, Frequency (Times/Week): 5, Weekly Exercise (Minutes/Week): 100, Intensity: Moderate, Exercise limited by: orthopedic condition(s)   Goals Addressed               This Visit's Progress     Patient stated (pt-stated)        I want to lose 50 lbs.       Depression Screen    03/12/2022    9:27 AM 03/12/2022    9:26 AM 01/01/2022   12:09 PM 08/04/2021   11:00 AM 01/13/2021  2:01 PM 01/13/2021    1:57 PM 10/04/2019   11:17 AM  PHQ 2/9 Scores  PHQ - 2 Score 1 0 '6 2 1 1 1  '$ PHQ- 9 Score   '24 8   6    '$ Fall Risk    03/12/2022    9:34 AM 01/01/2022   12:11 PM 10/14/2021    8:39 AM 08/04/2021   11:00 AM 01/13/2021    2:00 PM  [place] in the past year? '1 1 1 1 '$ 0  Number falls in past yr: 0 0 0 0  0  Injury with Fall? 0 1 0 1 0  Comment No injury or medical attention needed      Risk for fall due to : No Fall Risks Impaired balance/gait   Orthopedic patient  Follow up Falls prevention discussed Falls evaluation completed     Comment     uses can and walker    FALL [place]:  Any stairs in or around the home? Yes  If so, are there any without handrails? No  Home free of loose throw rugs in walkways, pet beds, electrical cords, etc? Yes  Adequate lighting in your home to reduce risk of falls? Yes   ASSISTIVE DEVICES UTILIZED TO PREVENT FALLS:  Life alert? Yes  Use of a cane, walker or w/c? Yes  Grab bars in the bathroom? Yes  Shower chair or bench in shower? Yes  Elevated toilet seat or a handicapped toilet? Yes   TIMED UP AND GO:  Was the test performed? No . Audio Visit   Cognitive Function:    09/14/2018    3:14 PM 09/01/2018    2:15 PM  MMSE - Mini Mental State Exam  Orientation to time 4 4  Orientation to Place 5 5  Registration 3 2  Attention/ Calculation 3 0  Recall 1 1  Language- name 2 objects 2 2  Language- repeat 0 1  Language- follow 3 step command 2 1  Language- read & follow direction 1 1  Write a sentence 1 1  Copy design 0 0  Total score 22 18        03/12/2022    9:36 AM  6CIT Screen  What Year? 0 points  What month? 0 points  What time? 0 points  Count back from 20 0 points  Months in reverse 4 points  Repeat phrase 0 points  Total Score 4 points    Immunizations Immunization History  Administered Date(s) Administered   Influenza Split 04/06/2012, 03/06/2013   Influenza Whole 04/08/2009, 04/05/2010   Influenza,inj,Quad PF,6+ Mos 03/22/2013, 09/01/2018, 04/24/2020   PFIZER(Purple Top)SARS-COV-2 Vaccination 09/30/2019, 10/21/2019   Pneumococcal Polysaccharide-23 04/15/2009, 07/07/2011, 04/06/2012    TDAP status: Due, Education has been provided regarding the importance of this vaccine. Advised may  receive this vaccine at local pharmacy or Health Dept. Aware to provide a copy of the vaccination record if obtained from local pharmacy or Health Dept. Verbalized acceptance and understanding.  Flu Vaccine status: Due, Education has been provided regarding the importance of this vaccine. Advised may receive this vaccine at local pharmacy or Health Dept. Aware to provide a copy of the vaccination record if obtained from local pharmacy or Health Dept. Verbalized acceptance and understanding.    Covid-19 vaccine status: Completed vaccines  Qualifies for Shingles Vaccine? Yes   Zostavax completed No   Shingrix Completed?: No.    Education has been provided regarding the importance  of this vaccine. Patient has been advised to call insurance company to determine out of pocket expense if they have not yet received this vaccine. Advised may also receive vaccine at local pharmacy or Health Dept. Verbalized acceptance and understanding.  Screening Tests Health Maintenance  Topic Date Due   COVID-19 Vaccine (3 - Pfizer risk series) 03/28/2022 (Originally 11/18/2019)   Zoster Vaccines- Shingrix (1 of 2) 07/10/2022 (Originally 02/25/1980)   INFLUENZA VACCINE  10/04/2022 (Originally 02/03/2022)   TETANUS/TDAP  03/13/2023 (Originally 02/25/1980)   MAMMOGRAM  03/18/2022   COLONOSCOPY (Pts 45-46yr Insurance coverage will need to be confirmed)  06/13/2023   Hepatitis C Screening  Completed   HIV Screening  Completed   HPV VACCINES  Aged Out   PAP SMEAR-Modifier  Discontinued    Health Maintenance  There are no preventive care reminders to display for this patient.   Colorectal cancer screening: Type of screening: Colonoscopy. Completed 06/12/13. Repeat every 10 years  Mammogram status: Completed 03/18/21. Repeat every year    Lung Cancer Screening: (Low Dose CT Chest recommended if Age 60-80years, 30 pack-year currently smoking OR have quit w/in 15years.) does not qualify.     Additional  Screening:  Hepatitis C Screening: does qualify; Completed 09/01/18  Vision Screening: Recommended annual ophthalmology exams for early detection of glaucoma and other disorders of the eye. Is the patient up to date with their annual eye exam?  Yes  Who is the provider or what is the name of the office in which the patient attends annual eye exams? Dr HHerbert DeanerIf pt is not established with a provider, would they like to be referred to a provider to establish care? No .   Dental Screening: Recommended annual dental exams for proper oral hygiene  Community Resource Referral / Chronic Care Management: CRR required this visit?  No   CCM required this visit?  No      Plan:     I have personally reviewed and noted the following in the patient's chart:   Medical and social history Use of alcohol, tobacco or illicit drugs  Current medications and supplements including opioid prescriptions. Patient is currently taking opioid prescriptions. Information provided to patient regarding non-opioid alternatives. Patient advised to discuss non-opioid treatment plan with their provider. Functional ability and status Nutritional status Physical activity Advanced directives List of other physicians Hospitalizations, surgeries, and ER visits in previous 12 months Vitals Screenings to include cognitive, depression, and falls Referrals and appointments  In addition, I have reviewed and discussed with patient certain preventive protocols, quality metrics, and best practice recommendations. A written personalized care plan for preventive services as well as general preventive health recommendations were provided to patient.     BCriselda Peaches LPN   [id number]/03/[id number]  Nurse Notes: None

## 2022-03-12 NOTE — Patient Instructions (Addendum)
[name] , Thank you for taking time to come for your Medicare Wellness Visit. I appreciate your ongoing commitment to your health goals. Please review the following plan we discussed and let me know if I can assist you in the future.   Opioid Pain Medicine Management Opioids are powerful medicines that are used to treat moderate to severe pain. When used for short periods of time, they can help you to: Sleep better. Do better in physical or occupational therapy. Feel better in the first few days after an injury. Recover from surgery. Opioids should be taken with the supervision of a trained health care provider. They should be taken for the shortest period of time possible. This is because opioids can be addictive, and the longer you take opioids, the greater your risk of addiction. This addiction can also be called opioid use disorder. What are the risks? Using opioid pain medicines for longer than 3 days increases your risk of side effects. Side effects include: Constipation. Nausea and vomiting. Breathing difficulties (respiratory depression). Drowsiness. Confusion. Opioid use disorder. Itching. Taking opioid pain medicine for a long period of time can affect your ability to do daily tasks. It also puts you at risk for: Motor vehicle crashes. Depression. Suicide. Heart attack. Overdose, which can be life-threatening. What is a pain treatment plan? A pain treatment plan is an agreement between you and your health care provider. Pain is unique to each person, and treatments vary depending on your condition. To manage your pain, you and your health care provider need to work together. To help you do this: Discuss the goals of your treatment, including how much pain you might expect to have and how you will manage the pain. Review the risks and benefits of taking opioid medicines. Remember that a good treatment plan uses more than one approach and minimizes the chance of side  effects. Be honest about the amount of medicines you take and about any drug or alcohol use. Get pain medicine prescriptions from only one health care provider. Pain can be managed with many types of alternative treatments. Ask your health care provider to refer you to one or more specialists who can help you manage pain through: Physical or occupational therapy. Counseling (cognitive behavioral therapy). Good nutrition. Biofeedback. Massage. Meditation. Non-opioid medicine. Following a gentle exercise program. How to use opioid pain medicine Taking medicine Take your pain medicine exactly as told by your health care provider. Take it only when you need it. If your pain gets less severe, you may take less than your prescribed dose if your health care provider approves. If you are not having pain, do nottake pain medicine unless your health care provider tells you to take it. If your pain is severe, do nottry to treat it yourself by taking more pills than instructed on your prescription. Contact your health care provider for help. Write down the times when you take your pain medicine. It is easy to become confused while on pain medicine. Writing the time can help you avoid overdose. Take other over-the-counter or prescription medicines only as told by your health care provider. Keeping yourself and others safe  While you are taking opioid pain medicine: Do not drive, use machinery, or power tools. Do not sign legal documents. Do not drink alcohol. Do not take sleeping pills. Do not supervise children by yourself. Do not do activities that require climbing or being in high places. Do not go to a lake, river, ocean, spa, or swimming pool.  Do not share your pain medicine with anyone. Keep pain medicine in a locked cabinet or in a secure area where pets and children cannot reach it. Stopping your use of opioids If you have been taking opioid medicine for more than a few weeks, you may  need to slowly decrease (taper) how much you take until you stop completely. Tapering your use of opioids can decrease your risk of symptoms of withdrawal, such as: Pain and cramping in the abdomen. Nausea. Sweating. Sleepiness. Restlessness. Uncontrollable shaking (tremors). Cravings for the medicine. Do not attempt to taper your use of opioids on your own. Talk with your health care provider about how to do this. Your health care provider may prescribe a step-down schedule based on how much medicine you are taking and how long you have been taking it. Getting rid of leftover pills Do not save any leftover pills. Get rid of leftover pills safely by: Taking the medicine to a prescription take-back program. This is usually offered by the county or law enforcement. Bringing them to a pharmacy that has a drug disposal container. Flushing them down the toilet. Check the label or package insert of your medicine to see whether this is safe to do. Throwing them out in the trash. Check the label or package insert of your medicine to see whether this is safe to do. If it is safe to throw it out, remove the medicine from the original container, put it into a sealable bag or container, and mix it with used coffee grounds, food scraps, dirt, or cat litter before putting it in the trash. Follow these instructions at home: Activity Do exercises as told by your health care provider. Avoid activities that make your pain worse. Return to your normal activities as told by your health care provider. Ask your health care provider what activities are safe for you. General instructions You may need to take these actions to prevent or treat constipation: Drink enough fluid to keep your urine pale yellow. Take over-the-counter or prescription medicines. Eat foods that are high in fiber, such as beans, whole grains, and fresh fruits and vegetables. Limit foods that are high in fat and processed sugars, such as  fried or sweet foods. Keep all follow-up visits. This is important. Where to find support If you have been taking opioids for a long time, you may benefit from receiving support for quitting from a local support group or counselor. Ask your health care provider for a referral to these resources in your area. Where to find more information Centers for Disease Control and Prevention (CDC): http://www.wolf.info/ [place] Food and Drug Administration (FDA): [place] Get help right away if: You may have taken too much of an opioid (overdosed). Common symptoms of an overdose: Your breathing is slower or more shallow than normal. You have a very slow heartbeat (pulse). You have slurred speech. You have nausea and vomiting. Your pupils become very small. You have other potential symptoms: You are very confused. You faint or feel like you will faint. You have cold, clammy skin. You have blue lips or fingernails. You have thoughts of harming yourself or harming others. These symptoms may represent a serious problem that is an emergency. Do not wait to see if the symptoms will go away. Get medical help right away. Call your local emergency services (911 in the [place] Do not drive yourself to the hospital.  If you ever feel like you may hurt yourself or others, or have thoughts about taking your  own life, get help right away. Go to your nearest emergency department or: Call your local emergency services (911 in the [place] Call the [facility] (872)607-4677 in the [place] Call a suicide crisis helpline, such as the [place] at 267 547 5189 or 988 in the [place]. This is open 24 hours a day in the [place] Text the Crisis Text Line at 636-304-5185 (in the [place].). Summary Opioid medicines can help you manage moderate to severe pain for a short period of time. A pain treatment plan is an agreement between you and your health care provider. Discuss the goals of your treatment,  including how much pain you might expect to have and how you will manage the pain. If you think that you or someone else may have taken too much of an opioid, get medical help right away. This information is not intended to replace advice given to you by your health care provider. Make sure you discuss any questions you have with your health care provider. Document Revised: 01/15/2021 Document Reviewed: 10/02/2020 Elsevier Patient Education  [place].    Screening recommendations/referrals: Colonoscopy: Done 06/12/13 Repeat 10 yrs Mammogram: Done 03/18/21 Repeat 1 Yr  Recommended yearly ophthalmology/optometry visit for glaucoma screening and checkup Recommended yearly dental visit for hygiene and checkup  Vaccinations: Influenza vaccine:Deferred  Tdap vaccine: Deferred Shingles vaccine: Deferred   Covid-19:Done  Advanced directives: Advance directive discussed with you today. Even though you declined this today, please call our office should you change your mind, and we can give you the proper paperwork for you to fill out.   Conditions/risks identified: None  Next appointment: Follow up in one year for your annual wellness visit    Preventive Care 65 Years and Older, Female Preventive care refers to lifestyle choices and visits with your health care provider that can promote health and wellness. What does preventive care include? A yearly physical exam. This is also called an annual well check. Dental exams once or twice a year. Routine eye exams. Ask your health care provider how often you should have your eyes checked. Personal lifestyle choices, including: Daily care of your teeth and gums. Regular physical activity. Eating a healthy diet. Avoiding tobacco and drug use. Limiting alcohol use. Practicing safe sex. Taking low-dose aspirin every day. Taking vitamin and mineral supplements as recommended by your health care provider. What happens during an annual  well check? The services and screenings done by your health care provider during your annual well check will depend on your age, overall health, lifestyle risk factors, and family history of disease. Counseling  Your health care provider may ask you questions about your: Alcohol use. Tobacco use. Drug use. Emotional well-being. Home and relationship well-being. Sexual activity. Eating habits. History of falls. Memory and ability to understand (cognition). Work and work Statistician. Reproductive health. Screening  You may have the following tests or measurements: Height, weight, and BMI. Blood pressure. Lipid and cholesterol levels. These may be checked every 5 years, or more frequently if you are over 32 years old. Skin check. Lung cancer screening. You may have this screening every year starting at age 11 if you have a 30-pack-year history of smoking and currently smoke or have quit within the past 15 years. Fecal occult blood test (FOBT) of the stool. You may have this test every year starting at age 6. Flexible sigmoidoscopy or colonoscopy. You may have a sigmoidoscopy every 5 years or a colonoscopy every 10 years starting at age  50. Hepatitis C blood test. Hepatitis B blood test. Sexually transmitted disease (STD) testing. Diabetes screening. This is done by checking your blood sugar (glucose) after you have not eaten for a while (fasting). You may have this done every 1-3 years. Bone density scan. This is done to screen for osteoporosis. You may have this done starting at age 84. Mammogram. This may be done every 1-2 years. Talk to your health care provider about how often you should have regular mammograms. Talk with your health care provider about your test results, treatment options, and if necessary, the need for more tests. Vaccines  Your health care provider may recommend certain vaccines, such as: Influenza vaccine. This is recommended every year. Tetanus, diphtheria,  and acellular pertussis (Tdap, Td) vaccine. You may need a Td booster every 10 years. Zoster vaccine. You may need this after age 53. Pneumococcal 13-valent conjugate (PCV13) vaccine. One dose is recommended after age 61. Pneumococcal polysaccharide (PPSV23) vaccine. One dose is recommended after age 66. Talk to your health care provider about which screenings and vaccines you need and how often you need them. This information is not intended to replace advice given to you by your health care provider. Make sure you discuss any questions you have with your health care provider. Document Released: 07/19/2015 Document Revised: 03/11/2016 Document Reviewed: 04/23/2015 Elsevier Interactive Patient Education  2017 [place] Prevention in the Home Falls can cause injuries. They can happen to people of all ages. There are many things you can do to make your home safe and to help prevent falls. What can I do on the outside of my home? Regularly fix the edges of walkways and driveways and fix any cracks. Remove anything that might make you trip as you walk through a door, such as a raised step or threshold. Trim any bushes or trees on the path to your home. Use bright outdoor lighting. Clear any walking paths of anything that might make someone trip, such as rocks or tools. Regularly check to see if handrails are loose or broken. Make sure that both sides of any steps have handrails. Any raised decks and porches should have guardrails on the edges. Have any leaves, snow, or ice cleared regularly. Use sand or salt on walking paths during winter. Clean up any spills in your garage right away. This includes oil or grease spills. What can I do in the bathroom? Use night lights. Install grab bars by the toilet and in the tub and shower. Do not use towel bars as grab bars. Use non-skid mats or decals in the tub or shower. If you need to sit down in the shower, use a plastic, non-slip  stool. Keep the floor dry. Clean up any water that spills on the floor as soon as it happens. Remove soap buildup in the tub or shower regularly. Attach bath mats securely with double-sided non-slip rug tape. Do not have throw rugs and other things on the floor that can make you trip. What can I do in the bedroom? Use night lights. Make sure that you have a light by your bed that is easy to reach. Do not use any sheets or blankets that are too big for your bed. They should not hang down onto the floor. Have a firm chair that has side arms. You can use this for support while you get dressed. Do not have throw rugs and other things on the floor that can make you trip. What can I do  in the kitchen? Clean up any spills right away. Avoid walking on wet floors. Keep items that you use a lot in easy-to-reach places. If you need to reach something above you, use a strong step stool that has a grab bar. Keep electrical cords out of the way. Do not use floor polish or wax that makes floors slippery. If you must use wax, use non-skid floor wax. Do not have throw rugs and other things on the floor that can make you trip. What can I do with my stairs? Do not leave any items on the stairs. Make sure that there are handrails on both sides of the stairs and use them. Fix handrails that are broken or loose. Make sure that handrails are as long as the stairways. Check any carpeting to make sure that it is firmly attached to the stairs. Fix any carpet that is loose or worn. Avoid having throw rugs at the top or bottom of the stairs. If you do have throw rugs, attach them to the floor with carpet tape. Make sure that you have a light switch at the top of the stairs and the bottom of the stairs. If you do not have them, ask someone to add them for you. What else can I do to help prevent falls? Wear shoes that: Do not have high heels. Have rubber bottoms. Are comfortable and fit you well. Are closed at the  toe. Do not wear sandals. If you use a stepladder: Make sure that it is fully opened. Do not climb a closed stepladder. Make sure that both sides of the stepladder are locked into place. Ask someone to hold it for you, if possible. Clearly mark and make sure that you can see: Any grab bars or handrails. First and last steps. Where the edge of each step is. Use tools that help you move around (mobility aids) if they are needed. These include: Canes. Walkers. Scooters. Crutches. Turn on the lights when you go into a dark area. Replace any light bulbs as soon as they burn out. Set up your furniture so you have a clear path. Avoid moving your furniture around. If any of your floors are uneven, fix them. If there are any pets around you, be aware of where they are. Review your medicines with your doctor. Some medicines can make you feel dizzy. This can increase your chance of falling. Ask your doctor what other things that you can do to help prevent falls. This information is not intended to replace advice given to you by your health care provider. Make sure you discuss any questions you have with your health care provider. Document Released: 04/18/2009 Document Revised: 11/28/2015 Document Reviewed: 07/27/2014 Elsevier Interactive Patient Education  2017 [employer].

## 2022-03-19 ENCOUNTER — Telehealth: Payer: Self-pay | Admitting: Family Medicine

## 2022-03-19 ENCOUNTER — Other Ambulatory Visit: Payer: Self-pay | Admitting: Nurse Practitioner

## 2022-03-19 NOTE — Telephone Encounter (Signed)
Pt has lost her metoprolol succinate (TOPROL-XL) 50 MG 24 hr tablet and would like new rx send to  [id number] Phone:  [phone] Fax:  [phone]   Pt has been without medication

## 2022-03-26 DIAGNOSIS — F250 Schizoaffective disorder, bipolar type: Secondary | ICD-10-CM | POA: Diagnosis not present

## 2022-03-31 NOTE — Telephone Encounter (Signed)
Have pt check with pharmacy.  RX was written at the end of August with refills.

## 2022-04-06 ENCOUNTER — Other Ambulatory Visit: Payer: Self-pay | Admitting: Family Medicine

## 2022-04-06 ENCOUNTER — Other Ambulatory Visit: Payer: Self-pay | Admitting: Neurology

## 2022-04-06 DIAGNOSIS — I10 Essential (primary) hypertension: Secondary | ICD-10-CM

## 2022-05-01 ENCOUNTER — Other Ambulatory Visit: Payer: Self-pay | Admitting: Family Medicine

## 2022-05-01 ENCOUNTER — Other Ambulatory Visit: Payer: Self-pay | Admitting: Nurse Practitioner

## 2022-05-01 ENCOUNTER — Other Ambulatory Visit: Payer: Self-pay | Admitting: Neurology

## 2022-05-01 DIAGNOSIS — J4540 Moderate persistent asthma, uncomplicated: Secondary | ICD-10-CM

## 2022-05-01 DIAGNOSIS — I10 Essential (primary) hypertension: Secondary | ICD-10-CM

## 2022-05-01 DIAGNOSIS — T782XXS Anaphylactic shock, unspecified, sequela: Secondary | ICD-10-CM

## 2022-05-04 ENCOUNTER — Other Ambulatory Visit: Payer: Self-pay | Admitting: Family Medicine

## 2022-05-04 DIAGNOSIS — E559 Vitamin D deficiency, unspecified: Secondary | ICD-10-CM

## 2022-05-07 ENCOUNTER — Ambulatory Visit: Payer: Medicare HMO | Admitting: Neurology

## 2022-05-07 VITALS — BP 139/81 | HR 86 | Ht 69.0 in | Wt 256.4 lb

## 2022-05-07 DIAGNOSIS — F02A2 Dementia in other diseases classified elsewhere, mild, with psychotic disturbance: Secondary | ICD-10-CM | POA: Diagnosis not present

## 2022-05-07 DIAGNOSIS — G3183 Dementia with Lewy bodies: Secondary | ICD-10-CM | POA: Diagnosis not present

## 2022-05-07 DIAGNOSIS — Z8669 Personal history of other diseases of the nervous system and sense organs: Secondary | ICD-10-CM

## 2022-05-07 DIAGNOSIS — R9082 White matter disease, unspecified: Secondary | ICD-10-CM | POA: Diagnosis not present

## 2022-05-07 MED ORDER — DONEPEZIL HCL 5 MG PO TABS: 5.0000 mg | ORAL_TABLET | Freq: Every day | ORAL | 0 refills | Status: DC

## 2022-05-07 NOTE — Progress Notes (Signed)
NEUROLOGY FOLLOW UP OFFICE NOTE  [name] [id number]  Assessment/Plan:   Major neurocognitive disorder, suspicious for Lewy body dementia Seizure disorder, stable.  EEG normal.  Off AED. Migraine without aura, without status migrainosus, not intractable White matter lesions on brain MRI.  Prior diagnosis of MS.  Never was on DMT.  My assessment is that findings are chronic small vessel ischemic changes and not demyelinating disease     Start rivastigmine 1.'5mg'$  twice daily with plan to increase to '3mg'$  twice daily in 4 weeks. Monitor for disease progression.  Would not treat hallucinations as they are not negatively impacting her. Follow up 6 months.     Subjective:  [name] is a 61 year old right-handed woman with seizure disorder, questionable multiple sclerosis, fibromyalgia, Bipolar disorder, depression and OSA who follows up for seizure disorder, migraines and cognitive disorder.  She is accompanied by her sister via phone.   UPDATE: Stopped rivastigmine because it made her feel funny.  Underwent further dementia workup. DaT scan on 10/22/2021 personally reviewed revealed asymmetric decreased radiotracer activity in the right striatum.  Serum labs from 10/14/2021 demonstrated elevated sed rate 70 with normal CRP 1.3, normal ammonia 40, non-reactive HIV, non-reactive RPR, negative ANA, normal CBC, normal CMP (except elevated glucose 121).  Underwent LP for CSF analysis in May which demonstrated cell count 4; protein 46; glucose 74; negative VDRL; normal IgG synthesis rate and index; greater than 5 oligoclonal bands in both CSF and serum; negative cryptococcal antigen; <0.2% probability of prion disease with elevated 14-3-3 protein of 5.056 but negative RT-QuIC, and T-TAU protein 299; negative culture; negative fungal culture; negative HSV 1/2 DNA; negative [facility] dementia autoimmune paraneoplastic panel; negative [facility] Alzheimer's Disease evaluation (p-Tau/Abeta42 0.013,  Abeta42 1232, total-Tau 176, Phospho-Tau 16.5); negative cytology.   Lives with sister who drives her to appointments and helps with her daily activities.   HISTORY: I  SEIZURE DISORDER: She has had history of seizures since childhood.  Previously took phenobarbital until age 76.  She was seizure-free for years until December 2014, in which she had 2 suspected seizures followed by a third seizure in February 2015.  She was noted by her sister to be unresponsive, shaking with eyes rolled back with tongue biting lasting 3 minutes.  No bowel or bladder incontinence.  Afterwards, she was confused and agitated.  She was started on Keppra '500mg'$  twice daily.  No recurrent seizures since 2015.  Keppra was subsequently discontinued after several years.  She had a repeat MRI of brain on 01/20/2019 which demonstrated mild generalized atrophy and again mild nonspecific cerebral white matter disease, similar to prior MRI from 10/18/2013. Awake and asleep EEG from 05/05/2019 was normal.  Therefore, Keppra was not restarted.   II QUESTIONABLE HISTORY OF MULTIPLE SCLEROSIS, UNLIKELY.  MRI BRAIN FINDINGS MORE CONSISTENT WITH SMALL VESSEL DISEASE:  She was diagnosed with MS in 2000 in [place] after experiencing visual changes, unsteady gait and difficulty with fine motor skills.  She took Betaseron until 2005.  MRI of brain with and without contrast from 11/03/04 reportedly demonstrated "no lesion indentified in the brain which would be characteristic for multiple sclerosis."  MRI of brain with and without contrast from 01/29/11 was personally reviewed and demonstrated mild non-enhancing nonspecific punctate T2 hyperintensities in the subcortical white matter, mildly increased on repeat study on 10/18/13.  She was admitted to [facility] on 09/11/16 for bilateral lower extremity weakness with LLQ abdominal pain and nausea.  MRI of cervical, thoracic  and lumbar spine were personally reviewed and demonstrated no demyelinating  spinal cord disease with mild disc bulging and facet hypertrophy at L3-4, L4-5 and L5-S1, but no significant spinal stenosis.  HIV testing was negative.   III MEMORY DEFICITS: She started having short-term memory problems in December 2019, however I presume symptoms started earlier.  At the time, her family noticed the problem.  She will lose her train of thought mid-sentence or she will forget why she walked into a room.  She is told that she keeps repeating questions.  She lives with her sister.  Her sister took away her car keys because she got confused driving on a familiar route.  One time, she gave a cab driver an extra $60.  She is now having trouble paying her bills and requires help from her sister.  She reports needing help bathing and getting dress.  She started putting on clothes backwards.  Her daughter got married in Spring 2019 and she cannot remember the wedding.  She reports hallucinations.  She woke up from a dream and turned over to briefly find a baby in her bed.  She underwent neuropsychological testing at [facility] on 11/23/18.  She did demonstrated reduced effort but no clear evidence of Alzheimer's disease or impairment often associated with MS.  Likely subjective memory deficits related to psychogenic factors but ultimately unclear.  She underwent repeat neuropsychological testing on 09/07/2019 demonstrated diffuse mild neurocognitive disorder of unclear etiology, possibly due to psychiatric factors as she demonstrated severe anxiety and moderate depression, but not definite.  She appeared to exhibit visual deficits on exam, skipping many items unintentionally and often circled responses of the bottom half of one answer and the top half of another answer.  In 2022, she reports slight worsening in memory.  She reports that sometimes she doesn't recognize close family members.  She may not remember being in houses that she has been to in the past.  She lives with two of her sisters.  Her  sister and daughter manage her bills and finances.  She sometimes sees people in the house who aren't there.  Sometimes, she makes paraphasic errors.  She may call a tree by a different name.  She does not drive.  She underwent repeat neuropsychological evaluation on 08/05/2021 which demonstrated significant cognitive decline compared to prior evaluation with diffuse and severe cognitive impairment meeting criteria for major neurocognitive disorder.  Etiology unclear and likely multifactorial but there is concern for Lewy Body Dementia (formed hallucinations, REM sleep behavior disorder, visual spatial and executive dysfunction) as well as possible Alzheimer's disease (amnestic across memory testing)   Labs from 07/20/18 include B12 479, folate 17.8, TSH 0.43.  CBC and CMP were unremarkable.   Both of her parents had Alzheimer's disease (in their 66s) and her sister was diagnosed in her early 26s.  Past medication:  rivastigmine (side effects).   IV CHRONIC LOW-BACK PAIN: She has chronic right sided low back pain radiating down the right leg.  MRI of lumbar spine in 2018 demonstrated mild disc bulging and facet hypertrophy at L3-4, L4-5 and L5-S1.  She was [facility] and underwent conservative management.  Repeat MRI lumbar spine from 01/15/2020 showed interval progression of multilevel spondylosis with central L5-S1 disc protrusion abutting left greater than right S1 nerve roots and left extraforaminal L4-5 annular fissuring.  She is undergoing injections.              V HEADACHES: She has history  of migraines but headaches started getting worse in September 2019.  She reports severe frontal or temporal pressure pain.  No preceding aura.  There is associated nausea, photophobia but no vomiting, phonophobia, visual disturbance or unilateral numbness or weakness.  They usually last 3 to 4 hours.  They are infrequent and she hasn't had one in several months.  She usually treats  it with Motrin and Flexeril.   Current NSAIDs:  Motrin Current analgesics:  none Current muscle relaxant:  Flexeril Current antihypertensive:  metoprolol Current antidepressant:  Doxepin '20mg'$  at bedtime, Prozac Current antiepileptic:  Lamotrigine '25mg'$  at bedtime Current sleep aid:  Trazodone Other medications:  Trazodone, Stelazine, Cogentin   Past antiemetic:  Reglan Past muscle relaxant:  Robaxin Past antidepressant:  Cymbalta, amitriptyline Past antiepileptic:  topiramate '100mg'$  twice daily, gabapentin Other past medications:  Latuda, Seroquel, Sonata, Ambien  PAST MEDICAL HISTORY: Past Medical History:  Diagnosis Date   Abdominal pain 09/27/2009   Allergic rhinitis 09/20/2008   Allergy skin test 10/10/08 Allergy Profile 01/18/2013-total IgE 242.4 with broad elevations for common inhalant allergens.    Arthritis    oa   Asthma, moderate persistent 06/04/2009   Allergy Profile 01/18/2013-total IgE 242.4 with broad elevations for common inhalant allergens. PFT 07/05/08- FVC 3.51/ 88%, FEV1 2.38/ 78%, R 0.68, 25-75% 1.45/ 44%, TLC 83%, DLCO 68% PFT 10/21/20- Moderate obstructive airways disease with slight respnse to bronchodilator. Normal Diffusion. FEV!/FVC 0.75    Backache, unspecified    Bipolar I disorder 06/13/2008   Chronic back pain 06/13/2008   Chronic respiratory failure with hypoxia 05/06/2020   Stable with routine use of O2 2L during sleep. Nocturnal hypoxemia with normal daytime oxygenation on room air.    Class 3 severe obesity due to excess calories with serious comorbidity and body mass index (BMI) of 40.0 to 44.9 in adult 12/19/2018   Closed left ankle fracture    DDD (degenerative disc disease), lumbosacral 10/03/2013   Disorder of sacrum 11/03/2013   Dysphagia, pharyngoesophageal phase 09/12/2008   Elevated LFTs 10/15/2010   Female pelvic peritoneal adhesions 12/19/2018   Fibromyalgia    Galactorrhea not associated with childbirth 12/19/2018   Generalized  anxiety disorder 04/05/2012   GERD (gastroesophageal reflux disease) 07/05/2008   Heart disease 12/19/2018   Hiatal hernia 11/03/2013   Hyperglycemia 06/13/2008   Hypertension    Intractable migraine with aura 11/03/2013   LBD (Lewy body dementia) ([place])    Lumbar radiculopathy 08/30/2012   Lumbosacral spondylosis 11/03/2013   Major depressive disorder 09/08/2019   Major neurocognitive disorder due to multiple etiologies 08/05/2021   Mixed hyperlipidemia 10/04/2019   On home oxygen therapy    nightly at 2l/m prn   Open left ankle fracture 07/16/2020   Oropharyngeal dysphagia 03/01/2018   OSA (obstructive sleep apnea) 06/13/2008   Pain in pelvis 12/19/2018   PONV (postoperative nausea and vomiting)    Schizoaffective disorder, bipolar type 10/04/2019   Seizure disorder    last seizure 2014   Status post open reduction with internal fixation (ORIF) of fracture of ankle 08/26/2020   L ankle   Thyroid disease 10/03/2013   Vitamin D deficiency 10/04/2019   Vocal cord disorder 04/02/2009   Weakness 09/11/2016    MEDICATIONS: Current Outpatient Medications on File Prior to Visit  Medication Sig Dispense Refill   acetaminophen (TYLENOL) 500 MG tablet Take 1,000 mg by mouth every 6 (six) hours as needed for moderate pain.     albuterol (PROVENTIL) (2.5 MG/3ML) 0.083% nebulizer solution Take 3  mLs by nebulization every 4 (four) hours as needed for wheezing or shortness of breath. 75 mL 12   albuterol (VENTOLIN HFA) 108 (90 Base) MCG/ACT inhaler TAKE 2 PUFFS BY MOUTH EVERY 6 HOURS AS NEEDED FOR WHEEZE OR SHORTNESS OF BREATH 8.5 each 5   aspirin EC 81 MG EC tablet Take 1 tablet (81 mg total) by mouth 2 (two) times daily. Swallow whole. 30 tablet 11   benztropine (COGENTIN) 0.5 MG tablet Take 0.5 mg by mouth daily.      EPINEPHRINE 0.3 mg/0.3 mL IJ SOAJ injection INJECT 0.3 MG INTO THE MUSCLE DAILY AS NEEDED FOR ANAPHYLAXIS. 2 each 3   famotidine (PEPCID) 20 MG tablet Take 1 tablet (20 mg  total) by mouth 2 (two) times daily. For stomach acid. (Patient taking differently: Take 20 mg by mouth daily.) 60 tablet 0   FLUoxetine (PROZAC) 40 MG capsule Take 80 mg by mouth daily.     hydrOXYzine (ATARAX/VISTARIL) 25 MG tablet Take 25 mg by mouth 4 (four) times daily as needed for anxiety or itching.  2   lamoTRIgine (LAMICTAL) 200 MG tablet Take 200 mg by mouth at bedtime.     lurasidone (LATUDA) 20 MG TABS tablet daily.     metoprolol succinate (TOPROL-XL) 50 MG 24 hr tablet TAKE 1 TABLET BY MOUTH EVERY DAY TAKE WITH OR IMMEDIATELY FOLLOWING A MEAL. 90 tablet 1   mirtazapine (REMERON) 15 MG tablet Take 15 mg by mouth at bedtime.     Multiple Vitamin (MULTIVITAMIN WITH MINERALS) TABS Take 1 tablet by mouth daily. For nutritional supplementation. (Patient taking differently: Take 1 tablet by mouth daily.) 30 tablet 0   ondansetron (ZOFRAN) 4 MG tablet Take 1 tablet (4 mg total) by mouth every 6 (six) hours as needed for nausea. 20 tablet 0   polyethylene glycol powder (GLYCOLAX/MIRALAX) 17 GM/SCOOP powder Take 17 g by mouth daily as needed. 1350 g 3   RESTASIS 0.05 % ophthalmic emulsion 1 drop 2 (two) times daily.     rivastigmine (EXELON) 3 MG capsule Take 1 capsule (3 mg total) by mouth 2 (two) times daily. 60 capsule 5   tiZANidine (ZANAFLEX) 4 MG tablet Take 4 mg by mouth 3 (three) times daily as needed for muscle spasms.     topiramate (TOPAMAX) 50 MG tablet as needed.     traMADol (ULTRAM) 50 MG tablet TAKE 1 TABLET BY MOUTH EVERY DAY AT BEDTIME AS NEEDED 30 tablet 0   traZODone (DESYREL) 100 MG tablet as needed.     TRELEGY ELLIPTA 100-62.5-25 MCG/ACT AEPB TAKE 1 PUFF BY MOUTH EVERY DAY 60 each 3   VITAMIN D PO daily.     ziprasidone (GEODON) 20 MG capsule Take 20 mg by mouth 2 (two) times daily with a meal.     No current facility-administered medications on file prior to visit.    ALLERGIES: Allergies  Allergen Reactions   Bee Venom Anaphylaxis   Depakote [Divalproex  Sodium] Shortness Of Breath and Rash   Haloperidol Lactate Swelling   Hornet Venom Anaphylaxis   Latex Anaphylaxis    mild rash/itching, wheezing/sob   Peanut-Containing Drug Products Anaphylaxis   Penicillins Anaphylaxis and Swelling   Quetiapine Anaphylaxis and Other (See Comments)   Shellfish-Derived Products Anaphylaxis   Valproic Acid Swelling   Amitriptyline Hcl Other (See Comments)    HALLUCINATIONS   Butorphanol Tartrate Other (See Comments)    Hallucinations   Gabapentin Other (See Comments)    Pt reports severe back and  side pain   Iodine Other (See Comments)    Flushing and fainting   Pregabalin Rash   Terbutaline Rash    FAMILY HISTORY: Family History  Problem Relation Age of Onset   Heart disease Father    Ovarian cancer Mother    Breast cancer Mother    Alzheimer's disease Sister 65   Colon cancer Neg Hx    Esophageal cancer Neg Hx    Stomach cancer Neg Hx    Rectal cancer Neg Hx       Objective:  Blood pressure 139/81, pulse 86, height '5\' 9"'$  (1.753 m), weight 256 lb 6.4 oz (116.3 kg), SpO2 96 %. General: No acute distress.  Patient appears well-groomed.   Heart:  RRR Neuro:  alert and oriented to person, place, and time.  Speech fluent and not dysarthric, language intact.      02/04/2021    1:00 PM 02/04/2021    3:00 PM 10/14/2021    9:00 AM 05/08/2022    6:00 AM  [employer] Mental Exam  Weekday Correct 0 0 1 1  Current year  '1 1 1  '$ What state are we in?  '1 1 1  '$ Amount spent  1 0 1  Amount left  0 0 0  # of Animals  '1 2 2  5 '$ objects recall  '1 2 2  '$ Number series  '1 1 1  '$ Hour markers  2 0 2  Time correct  0 0 0  Placed X in triangle correctly  '1 1 1  '$ Largest Figure  '1 1 1  '$ Name of female  '2 2 2  '$ Date back to work  0 2 0  Type of work  0 0 0  State she lived in  2 0 0  Total score  '14 14 15    '$ CN II-XII intact. Bulk and tone normal, muscle strength 5/5 throughout.  Postural and kinetic tremor in hands.  No bradykinesia or rigidity.   Sensation to light touch intact.  Deep tendon reflexes absent throughout.  Finger to nose testing intact.  Broad-based gait.  Uses walker.       Metta Clines, DO  CC: Grier Mitts, MD

## 2022-05-07 NOTE — Patient Instructions (Signed)
We will start donepezil (Aricept) 5mg daily for four weeks.  If you are tolerating the medication, then after four weeks, we will increase the dose to 10mg daily.  Side effects include nausea, vomiting, diarrhea, vivid dreams, and muscle cramps.  Please call the clinic if you experience any of these symptoms.  Follow up in 6 months 

## 2022-05-08 ENCOUNTER — Encounter: Payer: Self-pay | Admitting: Neurology

## 2022-05-13 ENCOUNTER — Other Ambulatory Visit: Payer: Self-pay | Admitting: Family Medicine

## 2022-05-13 DIAGNOSIS — G8929 Other chronic pain: Secondary | ICD-10-CM

## 2022-05-14 ENCOUNTER — Telehealth: Payer: Self-pay | Admitting: Neurology

## 2022-05-14 NOTE — Telephone Encounter (Signed)
Patient called concerns her pharmacy does not have her new medicine for her dementia, not sure of name.  90 days at a time  [facility] [place] [facility].

## 2022-05-15 NOTE — Telephone Encounter (Signed)
Pt called informed [facility] had [facility] prescription they are working on it at this time,

## 2022-05-25 ENCOUNTER — Other Ambulatory Visit: Payer: Self-pay | Admitting: Neurology

## 2022-06-02 ENCOUNTER — Ambulatory Visit: Payer: Medicare HMO | Admitting: Internal Medicine

## 2022-06-02 DIAGNOSIS — R7301 Impaired fasting glucose: Secondary | ICD-10-CM | POA: Diagnosis not present

## 2022-06-02 DIAGNOSIS — Z808 Family history of malignant neoplasm of other organs or systems: Secondary | ICD-10-CM | POA: Diagnosis not present

## 2022-06-02 DIAGNOSIS — E049 Nontoxic goiter, unspecified: Secondary | ICD-10-CM | POA: Diagnosis not present

## 2022-06-02 DIAGNOSIS — R1310 Dysphagia, unspecified: Secondary | ICD-10-CM | POA: Diagnosis not present

## 2022-06-03 DIAGNOSIS — F250 Schizoaffective disorder, bipolar type: Secondary | ICD-10-CM | POA: Diagnosis not present

## 2022-06-05 ENCOUNTER — Other Ambulatory Visit: Payer: Self-pay | Admitting: Family Medicine

## 2022-06-05 DIAGNOSIS — I10 Essential (primary) hypertension: Secondary | ICD-10-CM

## 2022-06-05 DIAGNOSIS — G8929 Other chronic pain: Secondary | ICD-10-CM

## 2022-06-09 ENCOUNTER — Other Ambulatory Visit: Payer: Self-pay | Admitting: Neurology

## 2022-06-10 ENCOUNTER — Other Ambulatory Visit: Payer: Self-pay | Admitting: Family Medicine

## 2022-06-10 ENCOUNTER — Other Ambulatory Visit: Payer: Self-pay | Admitting: Endocrinology

## 2022-06-10 DIAGNOSIS — G8929 Other chronic pain: Secondary | ICD-10-CM

## 2022-06-10 DIAGNOSIS — E049 Nontoxic goiter, unspecified: Secondary | ICD-10-CM

## 2022-06-10 MED ORDER — TRAMADOL HCL 50 MG PO TABS: ORAL_TABLET | ORAL | 1 refills | Status: DC

## 2022-06-11 ENCOUNTER — Telehealth (HOSPITAL_COMMUNITY): Payer: Self-pay

## 2022-06-11 NOTE — Telephone Encounter (Signed)
Attempted to contact patient to schedule Modified Barium Swallow - voicemail is full.  

## 2022-06-25 ENCOUNTER — Telehealth (HOSPITAL_COMMUNITY): Payer: Self-pay

## 2022-06-25 NOTE — Telephone Encounter (Signed)
2nd attempt to contact patient to schedule Modified Barium Swallow - vm is full.

## 2022-07-01 ENCOUNTER — Telehealth (HOSPITAL_COMMUNITY): Payer: Self-pay

## 2022-07-01 NOTE — Telephone Encounter (Signed)
3rd attempt to contact patient to schedule Modified Barium Swallow - vm is full. If no return call by 1/3 - order will be closed.

## 2022-07-20 ENCOUNTER — Other Ambulatory Visit: Payer: Medicare HMO

## 2022-07-20 ENCOUNTER — Ambulatory Visit: Payer: Medicare HMO | Admitting: Primary Care

## 2022-07-20 NOTE — Progress Notes (Deleted)
$@PatientA$  ID: [name], female    DOB: 1961/04/20, 62 y.o.   MRN: [id number]  No chief complaint on file.   Referring provider: Billie Ruddy, MD  HPI: 62 year old female, former smoker.  Past medical history significant for moderate persistent asthma, chronic respiratory failure with hypoxia, obstructive sleep apnea, vocal cord disorder, hypertension, seizure disorder.  Patient of Dr. Annamaria Boots, last seen in office on 12/29/2021.  PFT 07/05/08- FVC 3.51/ 88%, FEV1 2.38/ 78%, R 0.68, 25-75% 1.45/ 44%, TLC 83%, DLCO 68% Allergy Profile 01/18/2013-total IgE 242.4 with broad elevations for common inhalant allergens. -----------------------------------------------------------------------------------    Previous [facility] pulmonary encounter: 10/21/20-59 yoF  former smoker followed for asthma and allergic rhinitis, complicated by GERD, Depression, VCD, seizure disorder, Obesity,  O2 2L sleep/ [employer] -Advair 500, ProAir hfa, Epipen, Fractured L ankle- January.  Covid vax- 2 Phizer Flu vax- had ACT score 23  -----Patient feels that breathing is doing very good. No concerns Feeling very well now with no cough, wheeze or unusual dyspnea. No recent acute illness. She is pending orthopedic surgery and asks clearance.  Continues to sleep with O2, but not needed in daytime. Started remotely for nocturnal hypoxemia attributed to obesity hypoventilation. Routinely uses Advair 500. Denies thrush.  PFT 10/21/20- Moderate obstructive airways disease with slight respnse to bronchodilator. Normal Diffusion. FEV!/FVC 0.75.  CXR 04/30/20- IMPRESSION: Unremarkable radiographs of the chest.  No acute findings.   12/29/21- 13 yoF  former smoker followed for asthma and allergic rhinitis, Chronic Respiratory Failure with Hypoxia, complicated by GERD, Depression, VCD, Seizure disorder, Obesity, Lewy Body Dementia, BiPolar,  O2 2L sleep/ [employer]- owns it- no longer using -Trelegy 100, ProAir hfa, Epipen, Neb  albuterol,  Covid vax- 2 Phizer Being followed by Neurology (Dr Tomi Likens) for dementia She is no longer using oxygen at night-stopped at least several months ago. Trelegy works well but it has been causing thrush.  Her insurance covers the Trelegy so we will try to continue it if thrush can be managed. CXR 08/15/21- IMPRESSION: No active cardiopulmonary disease.    07/20/2022 Patient presents today for acute visit due to asthma exacerbation.  On Trelegy Ellipta 100 mcg 1 puff daily and prn Ventolin   No longer using oxygen Consider retesting for OSA       Allergies  Allergen Reactions   Bee Venom Anaphylaxis   Depakote [Divalproex Sodium] Shortness Of Breath and Rash   Haloperidol Lactate Swelling   Hornet Venom Anaphylaxis   Latex Anaphylaxis    mild rash/itching, wheezing/sob   Peanut-Containing Drug Products Anaphylaxis   Penicillins Anaphylaxis and Swelling   Quetiapine Anaphylaxis and Other (See Comments)   Shellfish-Derived Products Anaphylaxis   Valproic Acid Swelling   Amitriptyline Hcl Other (See Comments)    HALLUCINATIONS   Butorphanol Tartrate Other (See Comments)    Hallucinations   Gabapentin Other (See Comments)    Pt reports severe back and side pain   Iodine Other (See Comments)    Flushing and fainting   Pregabalin Rash   Terbutaline Rash    Immunization History  Administered Date(s) Administered   Influenza Split 04/06/2012, 03/06/2013   Influenza Whole 04/08/2009, 04/05/2010   Influenza,inj,Quad PF,6+ Mos 03/22/2013, 09/01/2018, 04/24/2020   PFIZER(Purple Top)SARS-COV-2 Vaccination 09/30/2019, 10/21/2019   Pneumococcal Polysaccharide-23 04/15/2009, 07/07/2011, 04/06/2012    Past Medical History:  Diagnosis Date   Abdominal pain 09/27/2009   Allergic rhinitis 09/20/2008   Allergy skin test 10/10/08 Allergy Profile 01/18/2013-total IgE 242.4 with broad elevations for common inhalant  allergens.    Arthritis    oa   Asthma, moderate persistent  06/04/2009   Allergy Profile 01/18/2013-total IgE 242.4 with broad elevations for common inhalant allergens. PFT 07/05/08- FVC 3.51/ 88%, FEV1 2.38/ 78%, R 0.68, 25-75% 1.45/ 44%, TLC 83%, DLCO 68% PFT 10/21/20- Moderate obstructive airways disease with slight respnse to bronchodilator. Normal Diffusion. FEV!/FVC 0.75    Backache, unspecified    Bipolar I disorder 06/13/2008   Chronic back pain 06/13/2008   Chronic respiratory failure with hypoxia 05/06/2020   Stable with routine use of O2 2L during sleep. Nocturnal hypoxemia with normal daytime oxygenation on room air.    Class 3 severe obesity due to excess calories with serious comorbidity and body mass index (BMI) of 40.0 to 44.9 in adult 12/19/2018   Closed left ankle fracture    DDD (degenerative disc disease), lumbosacral 10/03/2013   Disorder of sacrum 11/03/2013   Dysphagia, pharyngoesophageal phase 09/12/2008   Elevated LFTs 10/15/2010   Female pelvic peritoneal adhesions 12/19/2018   Fibromyalgia    Galactorrhea not associated with childbirth 12/19/2018   Generalized anxiety disorder 04/05/2012   GERD (gastroesophageal reflux disease) 07/05/2008   Heart disease 12/19/2018   Hiatal hernia 11/03/2013   Hyperglycemia 06/13/2008   Hypertension    Intractable migraine with aura 11/03/2013   LBD (Lewy body dementia) ([place])    Lumbar radiculopathy 08/30/2012   Lumbosacral spondylosis 11/03/2013   Major depressive disorder 09/08/2019   Major neurocognitive disorder due to multiple etiologies 08/05/2021   Mixed hyperlipidemia 10/04/2019   On home oxygen therapy    nightly at 2l/m prn   Open left ankle fracture 07/16/2020   Oropharyngeal dysphagia 03/01/2018   OSA (obstructive sleep apnea) 06/13/2008   Pain in pelvis 12/19/2018   PONV (postoperative nausea and vomiting)    Schizoaffective disorder, bipolar type 10/04/2019   Seizure disorder    last seizure 2014   Status post open reduction with internal fixation (ORIF) of  fracture of ankle 08/26/2020   L ankle   Thyroid disease 10/03/2013   Vitamin D deficiency 10/04/2019   Vocal cord disorder 04/02/2009   Weakness 09/11/2016    Tobacco History: Social History   Tobacco Use  Smoking Status Former   Packs/day: 1.00   Years: 5.00   Total pack years: 5.00   Types: Cigarettes   Quit date: 07/07/2007   Years since quitting: 15.0   Passive exposure: Never  Smokeless Tobacco Never   Counseling given: Not Answered   Outpatient Medications Prior to Visit  Medication Sig Dispense Refill   acetaminophen (TYLENOL) 500 MG tablet Take 1,000 mg by mouth every 6 (six) hours as needed for moderate pain.     albuterol (PROVENTIL) (2.5 MG/3ML) 0.083% nebulizer solution Take 3 mLs by nebulization every 4 (four) hours as needed for wheezing or shortness of breath. 75 mL 12   albuterol (VENTOLIN HFA) 108 (90 Base) MCG/ACT inhaler TAKE 2 PUFFS BY MOUTH EVERY 6 HOURS AS NEEDED FOR WHEEZE OR SHORTNESS OF BREATH 8.5 each 5   aspirin EC 81 MG EC tablet Take 1 tablet (81 mg total) by mouth 2 (two) times daily. Swallow whole. 30 tablet 11   benztropine (COGENTIN) 0.5 MG tablet Take 0.5 mg by mouth daily.      donepezil (ARICEPT) 5 MG tablet TAKE 1 TABLET BY MOUTH EVERYDAY AT BEDTIME 30 tablet 4   EPINEPHRINE 0.3 mg/0.3 mL IJ SOAJ injection INJECT 0.3 MG INTO THE MUSCLE DAILY AS NEEDED FOR ANAPHYLAXIS. 2 each 3  famotidine (PEPCID) 20 MG tablet Take 1 tablet (20 mg total) by mouth 2 (two) times daily. For stomach acid. (Patient taking differently: Take 20 mg by mouth daily.) 60 tablet 0   FLUoxetine (PROZAC) 40 MG capsule Take 80 mg by mouth daily.     hydrOXYzine (ATARAX/VISTARIL) 25 MG tablet Take 25 mg by mouth 4 (four) times daily as needed for anxiety or itching.  2   lamoTRIgine (LAMICTAL) 200 MG tablet Take 200 mg by mouth at bedtime.     lurasidone (LATUDA) 20 MG TABS tablet daily.     metoprolol succinate (TOPROL-XL) 50 MG 24 hr tablet TAKE 1 TABLET BY MOUTH EVERY  DAY TAKE WITH OR IMMEDIATELY FOLLOWING A MEAL. 90 tablet 0   mirtazapine (REMERON) 15 MG tablet Take 15 mg by mouth at bedtime.     Multiple Vitamin (MULTIVITAMIN WITH MINERALS) TABS Take 1 tablet by mouth daily. For nutritional supplementation. (Patient taking differently: Take 1 tablet by mouth daily.) 30 tablet 0   ondansetron (ZOFRAN) 4 MG tablet Take 1 tablet (4 mg total) by mouth every 6 (six) hours as needed for nausea. 20 tablet 0   polyethylene glycol powder (GLYCOLAX/MIRALAX) 17 GM/SCOOP powder Take 17 g by mouth daily as needed. 1350 g 3   RESTASIS 0.05 % ophthalmic emulsion 1 drop 2 (two) times daily.     tiZANidine (ZANAFLEX) 4 MG tablet Take 4 mg by mouth 3 (three) times daily as needed for muscle spasms.     topiramate (TOPAMAX) 50 MG tablet as needed.     traMADol (ULTRAM) 50 MG tablet TAKE 1 TABLET BY MOUTH AT BEDTIME AS NEEDED 30 tablet 1   traZODone (DESYREL) 100 MG tablet as needed.     TRELEGY ELLIPTA 100-62.5-25 MCG/ACT AEPB TAKE 1 PUFF BY MOUTH EVERY DAY 60 each 3   VITAMIN D PO daily.     ziprasidone (GEODON) 20 MG capsule Take 20 mg by mouth 2 (two) times daily with a meal.     No facility-administered medications prior to visit.      Review of Systems  Review of Systems   Physical Exam  There were no vitals taken for this visit. Physical Exam   Lab Results:  CBC    Component Value Date/Time   WBC 4.4 10/14/2021 0926   RBC 4.78 10/14/2021 0926   HGB 12.9 10/14/2021 0926   HCT 39.4 10/14/2021 0926   PLT 282.0 10/14/2021 0926   MCV 82.3 10/14/2021 0926   MCH 26.5 07/09/2020 2008   MCHC 32.7 10/14/2021 0926   RDW 15.6 (H) 10/14/2021 0926   LYMPHSABS 2.3 08/04/2021 1050   MONOABS 0.4 08/04/2021 1050   EOSABS 0.2 08/04/2021 1050   BASOSABS 0.0 08/04/2021 1050    BMET    Component Value Date/Time   NA 137 10/14/2021 0926   K 4.1 10/14/2021 0926   CL 100 10/14/2021 0926   CO2 26 10/14/2021 0926   GLUCOSE 121 (H) 10/14/2021 0926   BUN 11  10/14/2021 0926   CREATININE 0.97 10/14/2021 0926   CREATININE 1.10 (H) 04/24/2020 1107   CALCIUM 9.4 10/14/2021 0926   GFRNONAA >60 07/09/2020 2008   GFRNONAA 55 (L) 04/24/2020 1107   GFRAA 64 04/24/2020 1107    BNP No results found for: "BNP"  ProBNP    Component Value Date/Time   PROBNP 12.4 12/25/2011 1540    Imaging: No results found.   Assessment & Plan:   No problem-specific Assessment & Plan notes found for this  encounter.     Martyn Ehrich, NP 07/20/2022

## 2022-07-30 ENCOUNTER — Other Ambulatory Visit: Payer: Self-pay | Admitting: Family Medicine

## 2022-07-30 DIAGNOSIS — Z1231 Encounter for screening mammogram for malignant neoplasm of breast: Secondary | ICD-10-CM

## 2022-08-03 ENCOUNTER — Ambulatory Visit
Admission: RE | Admit: 2022-08-03 | Discharge: 2022-08-03 | Disposition: A | Discharge status: Home / Self Care | Payer: Medicare HMO | Source: Ambulatory Visit | Attending: Endocrinology | Admitting: Endocrinology

## 2022-08-03 DIAGNOSIS — E049 Nontoxic goiter, unspecified: Secondary | ICD-10-CM

## 2022-08-03 DIAGNOSIS — E042 Nontoxic multinodular goiter: Secondary | ICD-10-CM | POA: Diagnosis not present

## 2022-08-05 ENCOUNTER — Ambulatory Visit: Payer: Medicare HMO | Admitting: Family Medicine

## 2022-08-07 ENCOUNTER — Telehealth (HOSPITAL_COMMUNITY): Payer: Self-pay

## 2022-08-07 NOTE — Telephone Encounter (Signed)
Attempted to contact patient to schedule Modified Barium Swallow - left voicemail. 

## 2022-08-11 ENCOUNTER — Other Ambulatory Visit (HOSPITAL_COMMUNITY): Payer: Self-pay | Admitting: *Deleted

## 2022-08-11 DIAGNOSIS — R1310 Dysphagia, unspecified: Secondary | ICD-10-CM

## 2022-08-12 ENCOUNTER — Ambulatory Visit: Payer: Medicare HMO

## 2022-08-13 ENCOUNTER — Ambulatory Visit: Payer: Medicare HMO | Admitting: Family Medicine

## 2022-08-13 ENCOUNTER — Ambulatory Visit
Admission: RE | Admit: 2022-08-13 | Discharge: 2022-08-13 | Disposition: A | Discharge status: Home / Self Care | Payer: Medicare HMO | Source: Ambulatory Visit | Attending: Family Medicine | Admitting: Family Medicine

## 2022-08-13 DIAGNOSIS — Z1231 Encounter for screening mammogram for malignant neoplasm of breast: Secondary | ICD-10-CM

## 2022-08-14 ENCOUNTER — Ambulatory Visit: Payer: Medicare HMO | Admitting: Family Medicine

## 2022-08-17 ENCOUNTER — Encounter: Payer: Self-pay | Admitting: Family Medicine

## 2022-08-17 ENCOUNTER — Ambulatory Visit (INDEPENDENT_AMBULATORY_CARE_PROVIDER_SITE_OTHER): Payer: Medicare HMO | Admitting: Family Medicine

## 2022-08-17 VITALS — BP 138/80 | HR 79 | Temp 98.2°F | Ht 69.0 in | Wt 257.0 lb

## 2022-08-17 DIAGNOSIS — F02A2 Dementia in other diseases classified elsewhere, mild, with psychotic disturbance: Secondary | ICD-10-CM | POA: Diagnosis not present

## 2022-08-17 DIAGNOSIS — M2550 Pain in unspecified joint: Secondary | ICD-10-CM

## 2022-08-17 DIAGNOSIS — F250 Schizoaffective disorder, bipolar type: Secondary | ICD-10-CM

## 2022-08-17 DIAGNOSIS — Z6837 Body mass index (BMI) 37.0-37.9, adult: Secondary | ICD-10-CM | POA: Diagnosis not present

## 2022-08-17 DIAGNOSIS — G3183 Dementia with Lewy bodies: Secondary | ICD-10-CM

## 2022-08-17 DIAGNOSIS — Z23 Encounter for immunization: Secondary | ICD-10-CM | POA: Diagnosis not present

## 2022-08-17 DIAGNOSIS — I10 Essential (primary) hypertension: Secondary | ICD-10-CM | POA: Diagnosis not present

## 2022-08-17 DIAGNOSIS — G8929 Other chronic pain: Secondary | ICD-10-CM

## 2022-08-17 MED ORDER — TRAMADOL HCL 50 MG PO TABS: ORAL_TABLET | ORAL | 1 refills | Status: DC

## 2022-08-17 MED ORDER — METOPROLOL SUCCINATE ER 50 MG PO TB24: ORAL_TABLET | ORAL | 1 refills | Status: DC

## 2022-08-17 NOTE — Patient Instructions (Signed)
You were given your yearly flu shot this visit.  Consider getting a shingles vaccine which protects against shingles, rigors caused by chickenpox.  Consider getting a Tdap vaccine.  This protects against tetanus, diphtheria, and acellular pertussis also known as whooping cough.  Refills on metoprolol and tramadol were sent to your pharmacy.  Remember to schedule an appointment with your orthopedic surgeon about your knee pain and current progress with losing weight.

## 2022-08-17 NOTE — Progress Notes (Signed)
Established Patient Office Visit   Subjective  Patient ID: [name], female    DOB: May 17, 1961  Age: 62 y.o. MRN: [id number]  Chief Complaint  Patient presents with   Medical Management of Chronic Issues    Follow up on joint paint. Pt reports her R knee pain is bothering her today.     Patient is a 62 year old female with pmh sig for Lewy body dementia, HTN, numerous medication allergies, OA, obesity, OSA, thyroid disease, 1 disorder who was seen for follow-up.  Patient states she was seen recently by rheumatology, Dr. Benjamine Mola.  Had steroid injection in right shoulder~ Aug 2023 which seemed to help.  Patient making progress in weight loss, now 257lbs.  Was told by orthopedics needed to weigh 245 lbs in order to have surgery on right knee.  Patient states at times knee becomes very painful and swollen.  Recently her whole leg was swollen.  Was swimming regularly for exercise but doing less due to knee pain.  Patient notes overall having less joint pain.  Not having to use tramadol every night.   Patient living with her sister who helps her throughout the day.  Patient plans to move to [place] with her daughter over the summer.      ROS Negative unless stated above    Objective:     BP 138/80 (BP Location: Right Arm, Cuff Size: Large)   Pulse 79   Temp 98.2 F (36.8 C) (Oral)   Ht 5' 9"$  (1.753 m)   Wt 257 lb (116.6 kg)   SpO2 97%   BMI 37.95 kg/m    Physical Exam Constitutional:      General: She is not in acute distress.    Appearance: Normal appearance.  HENT:     Head: Normocephalic and atraumatic.     Nose: Nose normal.     Mouth/Throat:     Mouth: Mucous membranes are moist.  Cardiovascular:     Rate and Rhythm: Normal rate and regular rhythm.     Heart sounds: Normal heart sounds. No murmur heard.    No gallop.  Pulmonary:     Effort: Pulmonary effort is normal. No respiratory distress.     Breath sounds: Normal breath sounds. No wheezing, rhonchi  or rales.  Musculoskeletal:     Right knee: Swelling and effusion present.     Comments: Mild effusion right knee  Skin:    General: Skin is warm and dry.  Neurological:     Mental Status: She is alert and oriented to person, place, and time.     No results found for any visits on 08/17/22.    Assessment & Plan:  Chronic pain of multiple joints -Improving s/p weight loss -Patient advised to continue swimming several times per week to help relieve symptoms. -Right shoulder improving status post steroid injection. --Continue follow-up with rheumatology, Dr. Benjamine Mola.   -Patient encouraged to schedule follow-up with Ortho for right knee -     traMADol HCl; TAKE 1 TABLET BY MOUTH AT BEDTIME AS NEEDED  Dispense: 30 tablet; Refill: 1  Mild Lewy body dementia with psychotic disturbance (HCC) -Status post lumbar puncture -Stable -Continue Aricept 5 mg -Continue follow-up with neurology, Dr. Tomi Likens  Essential hypertension -     Metoprolol Succinate ER; Take with or immediately following a meal.  Dispense: 90 tablet; Refill: 1  Influenza vaccine needed -     Flu Vaccine QUAD 18moIM (Fluarix, Fluzone & Alfiuria Quad PF)  Schizoaffective disorder, bipolar  type -Stable -Continue current medications including Cogentin 0.5 mg daily, Prozac 40 mg daily, Lamictal 200 mg nightly, Latuda 20 mg daily, ziprasidone 20 mg twice daily -Continue follow-up with [facility]  Class II severe obesity due to excess calories with serious comorbidity body mass index (BMI) 37.0-37.9 in adult ([place]) -Body mass index is 37.95 kg/m. -Continue lifestyle modifications and exercise.  Return in about 3 months (around 11/15/2022).   Chelsea Ruddy, MD

## 2022-08-18 ENCOUNTER — Other Ambulatory Visit: Payer: Self-pay | Admitting: Family Medicine

## 2022-08-18 ENCOUNTER — Telehealth: Payer: Self-pay | Admitting: Family Medicine

## 2022-08-18 DIAGNOSIS — F250 Schizoaffective disorder, bipolar type: Secondary | ICD-10-CM | POA: Diagnosis not present

## 2022-08-18 DIAGNOSIS — R928 Other abnormal and inconclusive findings on diagnostic imaging of breast: Secondary | ICD-10-CM

## 2022-08-18 NOTE — Telephone Encounter (Signed)
In provider's absence, requesting new order for digital diagnostic mammo

## 2022-08-19 NOTE — Telephone Encounter (Signed)
This has been taking care of.

## 2022-08-20 ENCOUNTER — Telehealth: Payer: Self-pay

## 2022-08-20 ENCOUNTER — Other Ambulatory Visit: Payer: Self-pay | Admitting: Family Medicine

## 2022-08-20 ENCOUNTER — Ambulatory Visit
Admission: RE | Admit: 2022-08-20 | Discharge: 2022-08-20 | Disposition: A | Discharge status: Home / Self Care | Payer: Medicare HMO | Source: Ambulatory Visit | Attending: Family Medicine | Admitting: Family Medicine

## 2022-08-20 DIAGNOSIS — R928 Other abnormal and inconclusive findings on diagnostic imaging of breast: Secondary | ICD-10-CM

## 2022-08-20 DIAGNOSIS — R921 Mammographic calcification found on diagnostic imaging of breast: Secondary | ICD-10-CM

## 2022-08-20 NOTE — Patient Outreach (Signed)
  Care Coordination   08/20/2022 Name: [name] MRN: [id number] DOB: 23-Aug-1960   Care Coordination Outreach Attempts:  An unsuccessful telephone outreach was attempted today to offer the patient information about available care coordination services as a benefit of their health plan.   Follow Up Plan:  Additional outreach attempts will be made to offer the patient care coordination information and services.   Encounter Outcome:  No Answer   Care Coordination Interventions:  No, not indicated    Jone Baseman, RN, MSN [facility] Care Management Coordinator Direct Line [phone]

## 2022-08-28 ENCOUNTER — Ambulatory Visit (HOSPITAL_COMMUNITY): Admission: RE | Admit: 2022-08-28 | Payer: Medicare HMO | Source: Ambulatory Visit

## 2022-09-02 ENCOUNTER — Ambulatory Visit: Payer: Medicare HMO | Admitting: Family Medicine

## 2022-09-06 NOTE — Progress Notes (Unsigned)
Office Visit Note  Patient: [name]             Date of Birth: Apr 01, 1961           MRN: [id number]             PCP: Billie Ruddy, MD Referring: Billie Ruddy, MD Visit Date: 09/07/2022   Subjective:  No chief complaint on file.   History of Present Illness: [name] is a 62 y.o. female here for follow up ***   Previous HPI 03/03/22 [name] is a 62 y.o. female here for follow up for evaluation of joint pain and swelling with abnormal lab results.  Since our initial visit she was able to follow-up with neurology and additional studies including CSF analysis combined with her symptoms apparently consistent with mild Lewy body dementia.  Her neuroimaging and fluid analysis did not indicate evidence of current multiple sclerosis.  She does continue having joint pain and stiffness in multiple areas particularly bad at this time in her shoulder.  She is using a cane for assistive device due to the knee pain and also her worsened balance instability.     Previous HPI 11/06/2021 [name] is a 62 y.o. female here for evaluation of joint pain and swelling with abnormal lab results. She has a history of joint pains and fibromyalgia but reportedly increase shoulder and wrist pain and some swelling involved.  These left arm symptoms and increase in joint swelling have been going on for at least 6 months.  She sustained left shoulder injury when falling and breaking her ankle that have MRI in March showing multiple abnormalities with rotator cuff tear tendinopathy moderate degenerative changes and some bursitis.  She reports treatment with intra-articular steroid injection that did not give an appreciable benefit.  She worked with physical therapy at the time but was mostly focused on her ankle and mobility.  She takes Tylenol and ibuprofen which are partially helpful.  He is trying to limit use of the ibuprofen on account of medical problems including CKD  stage III.  She sees Dr. Percell Miller for orthopedic treatment reports planning right knee arthroplasty for advanced osteoarthritis. Her history is significant for newly identified major cognitive disorder for which she has ongoing neurology follow up.  This includes considerable memory and concentration difficulty, also having tremors and reports weakness coming and going.  She has numbness in the left hand also worsening during this time symptoms are most severe at nighttime.  Left wrist swelling has been present intermittently with worsening of associated symptoms wrist pain and hand numbness at that time.  Today the swelling is doing pretty well.  Her work-up so far shown some lesions on brain MRI imaging without definitive diagnosis so far.  She had inflammatory markers elevated with high white blood cell count complement C4 and sedimentation rate test.  Specific antibody tests for ANA were all negative.     Labs reviewed 10/2021 ANA neg ESR 70 CRP 1.3   07/2021 Complement C4 62 ANA neg dsDNA, parietal cell, RNP, SM, ASMA, AMA, Scl-70, SSA, SSB, TPO Abs neg   Imaging reviewed 09/29/20 MRI left shoulder IMPRESSION: 1. Partial thickness articular surface tear of the distal anterior supraspinatus tendon with moderate supraspinatus tendinopathy. 2. Mild infraspinatus and subscapularis tendinopathy. 3. Moderate degenerative glenohumeral arthropathy with moderate humeral head spurring and a central non-fragmented osteochondral lesion along the glenoid. Degenerative subcortical cystic lesion along the upper lateral margin of the humeral  head. 4. Trace subacromial subdeltoid bursitis.  No Rheumatology ROS completed.   PMFS History:  Patient Active Problem List   Diagnosis Date Noted   Pain in right shoulder 03/10/2022   Unilateral primary osteoarthritis, right knee 03/10/2022   Sedimentation rate elevation 11/06/2021   Nocturnal hypoxia 08/14/2021   Anaphylaxis 08/14/2021   Preoperative  respiratory examination 08/14/2021   Major neurocognitive disorder due to multiple etiologies 08/05/2021   Chronic respiratory failure with hypoxia 05/06/2020   Schizoaffective disorder, bipolar type 10/04/2019   Mixed hyperlipidemia 10/04/2019   Vitamin D deficiency 10/04/2019   Major depressive disorder    Female pelvic peritoneal adhesions 12/19/2018   Galactorrhea not associated with childbirth 12/19/2018   Heart disease 12/19/2018   Class 3 severe obesity due to excess calories with serious comorbidity and body mass index (BMI) of 40.0 to 44.9 in adult 12/19/2018   Pain in pelvis 12/19/2018   Oropharyngeal dysphagia 03/01/2018   Weakness 09/11/2016   Seizure disorder ([place]) 12/30/2013   Disorder of sacrum 11/03/2013   Hiatal hernia 11/03/2013   Hypertension 11/03/2013   Intractable migraine with aura 11/03/2013   Lumbosacral spondylosis 11/03/2013   Fibromyalgia 11/03/2013   DDD (degenerative disc disease), lumbosacral 10/03/2013   Thyroid disease 10/03/2013   Lumbar radiculopathy 08/30/2012   Generalized anxiety disorder 04/05/2012   Elevated LFTs 10/15/2010   Abdominal pain 09/27/2009   Asthma, moderate persistent 06/04/2009   Vocal cord disorder 04/02/2009   Personal history of allergy to seafood 10/10/2008   Allergic rhinitis 09/20/2008   Dysphagia, pharyngoesophageal phase 09/12/2008   GERD (gastroesophageal reflux disease) 07/05/2008   Bipolar I disorder ([place]) 06/13/2008   OSA (obstructive sleep apnea) 06/13/2008   Arthritis 06/13/2008   Chronic back pain 06/13/2008   Hyperglycemia 06/13/2008    Past Medical History:  Diagnosis Date   Abdominal pain 09/27/2009   Allergic rhinitis 09/20/2008   Allergy skin test 10/10/08 Allergy Profile 01/18/2013-total IgE 242.4 with broad elevations for common inhalant allergens.    Arthritis    oa   Asthma, moderate persistent 06/04/2009   Allergy Profile 01/18/2013-total IgE 242.4 with broad elevations for common inhalant  allergens. PFT 07/05/08- FVC 3.51/ 88%, FEV1 2.38/ 78%, R 0.68, 25-75% 1.45/ 44%, TLC 83%, DLCO 68% PFT 10/21/20- Moderate obstructive airways disease with slight respnse to bronchodilator. Normal Diffusion. FEV!/FVC 0.75    Backache, unspecified    Bipolar I disorder 06/13/2008   Chronic back pain 06/13/2008   Chronic respiratory failure with hypoxia 05/06/2020   Stable with routine use of O2 2L during sleep. Nocturnal hypoxemia with normal daytime oxygenation on room air.    Class 3 severe obesity due to excess calories with serious comorbidity and body mass index (BMI) of 40.0 to 44.9 in adult 12/19/2018   Closed left ankle fracture    DDD (degenerative disc disease), lumbosacral 10/03/2013   Disorder of sacrum 11/03/2013   Dysphagia, pharyngoesophageal phase 09/12/2008   Elevated LFTs 10/15/2010   Female pelvic peritoneal adhesions 12/19/2018   Fibromyalgia    Galactorrhea not associated with childbirth 12/19/2018   Generalized anxiety disorder 04/05/2012   GERD (gastroesophageal reflux disease) 07/05/2008   Heart disease 12/19/2018   Hiatal hernia 11/03/2013   Hyperglycemia 06/13/2008   Hypertension    Intractable migraine with aura 11/03/2013   LBD (Lewy body dementia) ([place])    Lumbar radiculopathy 08/30/2012   Lumbosacral spondylosis 11/03/2013   Major depressive disorder 09/08/2019   Major neurocognitive disorder due to multiple etiologies 08/05/2021   Mixed hyperlipidemia 10/04/2019  On home oxygen therapy    nightly at 2l/m prn   Open left ankle fracture 07/16/2020   Oropharyngeal dysphagia 03/01/2018   OSA (obstructive sleep apnea) 06/13/2008   Pain in pelvis 12/19/2018   PONV (postoperative nausea and vomiting)    Schizoaffective disorder, bipolar type 10/04/2019   Seizure disorder    last seizure 2014   Status post open reduction with internal fixation (ORIF) of fracture of ankle 08/26/2020   L ankle   Thyroid disease 10/03/2013   Vitamin D deficiency 10/04/2019    Vocal cord disorder 04/02/2009   Weakness 09/11/2016    Family History  Problem Relation Age of Onset   Heart disease Father    Ovarian cancer Mother    Breast cancer Mother    Alzheimer's disease Sister 74   Colon cancer Neg Hx    Esophageal cancer Neg Hx    Stomach cancer Neg Hx    Rectal cancer Neg Hx    Past Surgical History:  Procedure Laterality Date   APPENDECTOMY  1988   laparoscopic   BREAST LUMPECTOMY WITH NEEDLE LOCALIZATION Left 06/06/2015   Procedure: LEFT BREAST LUMPECTOMY WITH NEEDLE LOCALIZATION TIMES TWO;  Surgeon: Erroll Luna, MD;  Location: [facility];  Service: General;  Laterality: Left;   [place]   twins   CHOLECYSTECTOMY  yrs ago   COLONOSCOPY N/A 06/12/2013   Procedure: COLONOSCOPY;  Surgeon: Irene Shipper, MD;  Location: [facility];  Service: Endoscopy;  Laterality: N/A;   HERNIA REPAIR  yrs ago   umbilical   ORIF ANKLE FRACTURE Left 07/16/2020   Procedure: OPEN REDUCTION INTERNAL FIXATION (ORIF) ANKLE FRACTURE;  Surgeon: Renette Butters, MD;  Location: [facility];  Service: Orthopedics;  Laterality: Left;   TUBAL LIGATION  1988   VAGINAL HYSTERECTOMY  2005   complete   Social History   Social History Narrative   Single, 3 children   Right handed   Associates degree   occ 1 cup daily      09/01/2018: Lives with sister, Rise Paganini, who helps manage medications, in 2 story house, and a pit bull   Has one daughter in [place], 2 sons in [place]; 7 grandchildren   Currently not exercising, but wants to return to water aerobics at [employer] History  Administered Date(s) Administered   Influenza Split 04/06/2012, 03/06/2013   Influenza Whole 04/08/2009, 04/05/2010   Influenza,inj,Quad PF,6+ Mos 03/22/2013, 09/01/2018, 04/24/2020, 08/17/2022   PFIZER(Purple Top)SARS-COV-2 Vaccination 09/30/2019, 10/21/2019   Pneumococcal Polysaccharide-23 04/15/2009, 07/07/2011, 04/06/2012     Objective: Vital  Signs: There were no vitals taken for this visit.   Physical Exam   Musculoskeletal Exam: ***  CDAI Exam: CDAI Score: -- Patient Global: --; Provider Global: -- Swollen: --; Tender: -- Joint Exam 09/07/2022   No joint exam has been documented for this visit   There is currently no information documented on the homunculus. Go to the Rheumatology activity and complete the homunculus joint exam.  Investigation: No additional findings.  Imaging: MM Digital Diagnostic Unilat L  Result Date: 08/20/2022 CLINICAL DATA:  62 year old female for further evaluation of LEFT breast calcifications identified on screening mammogram. History of LEFT breast surgery for papillomas. EXAM: DIGITAL DIAGNOSTIC UNILATERAL LEFT MAMMOGRAM WITH CAD TECHNIQUE: Left digital diagnostic mammography was performed. COMPARISON:  Previous exam(s). ACR Breast Density Category b: There are scattered areas of fibroglandular density. FINDINGS: Full field and magnification views of the LEFT breast are performed. Due to  patient's Parkinson's, mild motion on several images is noted despite repeat imaging. A 0.4 cm group of dystrophic appearing calcifications within a oval area of fatty density is noted within the OUTER LEFT breast. IMPRESSION: 0.4 cm likely benign dystrophic/fat necrosis calcifications within the OUTER LEFT breast. Six-month follow-up recommended to ensure stability. RECOMMENDATION: LEFT diagnostic mammogram with magnification views in 6 months. I have discussed the findings and recommendations with the patient. If applicable, a reminder letter will be sent to the patient regarding the next appointment. BI-RADS CATEGORY  3: Probably benign. Electronically Signed   By: Margarette Canada M.D.   On: 08/20/2022 11:33  MM 3D SCREEN BREAST BILATERAL  Result Date: 08/17/2022 CLINICAL DATA:  Screening. EXAM: DIGITAL SCREENING BILATERAL MAMMOGRAM WITH TOMOSYNTHESIS AND CAD TECHNIQUE: Bilateral screening digital craniocaudal and  mediolateral oblique mammograms were obtained. Bilateral screening digital breast tomosynthesis was performed. The images were evaluated with computer-aided detection. COMPARISON:  Previous exam(s). ACR Breast Density Category a: The breasts are almost entirely fatty. FINDINGS: In the left breast, calcifications warrant further evaluation. In the right breast, no findings suspicious for malignancy. IMPRESSION: Further evaluation is suggested for calcifications in the left breast. RECOMMENDATION: Diagnostic mammogram of the left breast. (Code:[id number]-L-21M) The patient will be contacted regarding the findings, and additional imaging will be scheduled. BI-RADS CATEGORY  0: Incomplete: Need additional imaging evaluation. Electronically Signed   By: Kristopher Oppenheim M.D.   On: 08/17/2022 15:59    Recent Labs: Lab Results  Component Value Date   WBC 4.4 10/14/2021   HGB 12.9 10/14/2021   PLT 282.0 10/14/2021   NA 137 10/14/2021   K 4.1 10/14/2021   CL 100 10/14/2021   CO2 26 10/14/2021   GLUCOSE 121 (H) 10/14/2021   BUN 11 10/14/2021   CREATININE 0.97 10/14/2021   BILITOT 0.3 10/14/2021   ALKPHOS 60 10/14/2021   AST 22 10/14/2021   ALT 25 10/14/2021   PROT 7.1 10/14/2021   ALBUMIN 3.7 11/10/2021   CALCIUM 9.4 10/14/2021   GFRAA 64 04/24/2020    Speciality Comments: No specialty comments available.  Procedures:  No procedures performed Allergies: Bee venom, Depakote [divalproex sodium], Haloperidol lactate, Hornet venom, Latex, Peanut-containing drug products, Penicillins, Quetiapine, Shellfish-derived products, Valproic acid, Amitriptyline hcl, Butorphanol tartrate, Gabapentin, Iodine, Pregabalin, and Terbutaline   Assessment / Plan:     Visit Diagnoses: No diagnosis found.  ***  Orders: No orders of the defined types were placed in this encounter.  No orders of the defined types were placed in this encounter.    Follow-Up Instructions: No follow-ups on file.   Collier Salina,  MD  Note - This record has been created using [employer].  Chart creation errors have been sought, but may not always  have been located. Such creation errors do not reflect on  the standard of medical care.

## 2022-09-07 ENCOUNTER — Encounter: Payer: Self-pay | Admitting: Internal Medicine

## 2022-09-07 ENCOUNTER — Ambulatory Visit: Payer: Medicare HMO | Attending: Internal Medicine | Admitting: Internal Medicine

## 2022-09-07 VITALS — BP 154/87 | HR 75 | Ht 68.0 in | Wt 252.0 lb

## 2022-09-07 DIAGNOSIS — F0280 Dementia in other diseases classified elsewhere without behavioral disturbance: Secondary | ICD-10-CM | POA: Diagnosis not present

## 2022-09-07 DIAGNOSIS — M25511 Pain in right shoulder: Secondary | ICD-10-CM

## 2022-09-07 DIAGNOSIS — M1990 Unspecified osteoarthritis, unspecified site: Secondary | ICD-10-CM

## 2022-09-07 DIAGNOSIS — M797 Fibromyalgia: Secondary | ICD-10-CM | POA: Diagnosis not present

## 2022-09-07 DIAGNOSIS — G8929 Other chronic pain: Secondary | ICD-10-CM

## 2022-09-08 MED ORDER — TRIAMCINOLONE ACETONIDE 40 MG/ML IJ SUSP
40.0000 mg | INTRAMUSCULAR | Status: AC | PRN
  Administered 2022-09-07: 40 mg via INTRA_ARTICULAR

## 2022-09-08 MED ORDER — LIDOCAINE HCL 1 % IJ SOLN
3.0000 mL | INTRAMUSCULAR | Status: AC | PRN
  Administered 2022-09-07: 3 mL

## 2022-09-11 ENCOUNTER — Ambulatory Visit (HOSPITAL_COMMUNITY)
Admission: RE | Admit: 2022-09-11 | Discharge: 2022-09-11 | Disposition: A | Discharge status: Home / Self Care | Payer: Medicare HMO | Source: Ambulatory Visit | Attending: Family Medicine | Admitting: Family Medicine

## 2022-09-11 ENCOUNTER — Ambulatory Visit (HOSPITAL_COMMUNITY)
Admission: RE | Admit: 2022-09-11 | Discharge: 2022-09-11 | Disposition: A | Discharge status: Home / Self Care | Payer: Medicare HMO | Source: Ambulatory Visit | Attending: Endocrinology | Admitting: Endocrinology

## 2022-09-11 DIAGNOSIS — R1310 Dysphagia, unspecified: Secondary | ICD-10-CM | POA: Diagnosis not present

## 2022-09-30 ENCOUNTER — Other Ambulatory Visit: Payer: Self-pay | Admitting: Family Medicine

## 2022-09-30 DIAGNOSIS — G8929 Other chronic pain: Secondary | ICD-10-CM

## 2022-10-01 ENCOUNTER — Other Ambulatory Visit: Payer: Self-pay | Admitting: Family Medicine

## 2022-10-01 DIAGNOSIS — G8929 Other chronic pain: Secondary | ICD-10-CM

## 2022-10-01 MED ORDER — TRAMADOL HCL 50 MG PO TABS: ORAL_TABLET | ORAL | 1 refills | Status: DC

## 2022-10-01 NOTE — Telephone Encounter (Signed)
Pt is calling and would like to no why tramadol was denied

## 2022-10-07 DIAGNOSIS — Z1211 Encounter for screening for malignant neoplasm of colon: Secondary | ICD-10-CM | POA: Diagnosis not present

## 2022-10-07 DIAGNOSIS — Z1272 Encounter for screening for malignant neoplasm of vagina: Secondary | ICD-10-CM | POA: Diagnosis not present

## 2022-10-07 DIAGNOSIS — Z01419 Encounter for gynecological examination (general) (routine) without abnormal findings: Secondary | ICD-10-CM | POA: Diagnosis not present

## 2022-10-07 DIAGNOSIS — E042 Nontoxic multinodular goiter: Secondary | ICD-10-CM | POA: Diagnosis not present

## 2022-10-28 ENCOUNTER — Telehealth: Payer: Self-pay | Admitting: Neurology

## 2022-10-28 NOTE — Telephone Encounter (Signed)
Received surgery clearance from [name] through the fax. Putting in Dr. Moises Blood box

## 2022-10-29 ENCOUNTER — Ambulatory Visit (INDEPENDENT_AMBULATORY_CARE_PROVIDER_SITE_OTHER): Payer: Medicare HMO | Admitting: Family Medicine

## 2022-10-29 ENCOUNTER — Encounter: Payer: Self-pay | Admitting: Family Medicine

## 2022-10-29 ENCOUNTER — Telehealth: Payer: Self-pay | Admitting: Primary Care

## 2022-10-29 VITALS — BP 136/84 | HR 74 | Temp 98.1°F | Wt 252.2 lb

## 2022-10-29 DIAGNOSIS — M1711 Unilateral primary osteoarthritis, right knee: Secondary | ICD-10-CM | POA: Diagnosis not present

## 2022-10-29 DIAGNOSIS — Z01818 Encounter for other preprocedural examination: Secondary | ICD-10-CM

## 2022-10-29 NOTE — Telephone Encounter (Signed)
Fax received from Dr. Margarita Rana with [name] to perform a Rt Total Knee Replacement on patient.  Patient needs surgery clearance. Surgery is 11/13/2022. Patient was seen on 12/26/2021. Office protocol is a risk assessment can be sent to surgeon if patient has been seen in 60 days or less.   Sending to [name] for risk assessment or recommendations if patient needs to be seen in office prior to surgical procedure.    [name] patient has OV scheduled for 11/04/2022

## 2022-10-29 NOTE — Progress Notes (Signed)
Chief Complaint  Patient presents with   Pre-op Exam    Surgical eval for TKR right    HPI:  Patient is seen for optimization of general medical care prior to surgery.  States will go home with a relative after procedure as she has steps in her current home.  Surgery type:  R TKR Date of surgery: 11/13/22  Kidney disease? No Prior surgeries/Issues following anesthesia? H/o ankle surgery.  No issues with anesthesia Hx MI, heart arrythmia, CHF, angina or stroke? none Epilepsy or Seizures?  seizure disorder Arthritis or problems with neck or jaw?  History of arthritis But not in neck or jaw. Thyroid disease?  History of thyroid nodule(s) Liver disease? none Asthma, COPD or chronic lung disease?  Asthma, chronic respiratory failure with hypoxia Diabetes? none (Needs to be evaluated by anesthesia if yes to these questions.)  Other: Poor nutrition, Frail or other: no  METS:  ?Can take care of self, such as eat, dress, or use the toilet (1 MET). yes ?Can walk up a flight of steps or a hill (4 METs).yes ?Can do heavy work around the house such as scrubbing floors or lifting or moving heavy furniture (between 4 and 10 METs). yes ?Can participate in strenuous sports such as swimming, singles tennis, football, basketball, and skiing (>10 METs) no . AHA Risks: Major predictors that require intensive management and may lead to delay in or cancellation of the operative procedure unless emergent: NONE   Unstable coronary syndromes including unstable or severe angina or recent MI   Decompensated heart failure including NYHA functional class IV or worsening or new-onset HF   Significant arrhythmias including high grade AV block, symptomatic ventricular arrhythmias, supraventricular arrhythmias with ventricular rate >100 bpm at rest, symptomatic bradycardia, and newly recognized ventricular tachycardia   Severe heart valve disease including severe aortic stenosis or symptomatic mitral  stenosis   Other clinical predictors that warrant careful assessment of current status: NONE   History of ischemic heart disease  History of cerebrovascular disease   History of compensated heart failure or prior heart failure   Diabetes mellitus   Renal insufficiency  Type of surgery and Risk: 1) High risk (reported risk of cardiac death or nonfatal myocardial infarction [MI] often greater than 5 percent):   Aortic and other major vascular surgery   Peripheral artery surgery   2)Intermediate risk (reported risk of cardiac death or nonfatal MI generally 1 to 5 percent):   Carotid endarterectomy   Head and neck surgery   Intraperitoneal and intrathoracic surgery   Orthopedic surgery   Prostate surgery   3)Low risk (reported risk of cardiac death or nonfatal MI generally less than 1 percent):   Ambulatory surgery   Endoscopic procedures   Superficial procedure   Cataract surgery   Breast surgery  Medications that need to be addressed prior to surgery: None Discontinue acei/arbs/non-statin lipid lowering drugs day of surgery ASA stop 7 days before or discuss with cardiology if CV risks, other anticoagulants discuss with cardiology.   ROS: See pertinent positives and negatives per HPI. 11 point ROS negative except where noted.  Past Medical History:  Diagnosis Date   Abdominal pain 09/27/2009   Allergic rhinitis 09/20/2008   Allergy skin test 10/10/08 Allergy Profile 01/18/2013-total IgE 242.4 with broad elevations for common inhalant allergens.    Arthritis    oa   Asthma, moderate persistent 06/04/2009   Allergy Profile 01/18/2013-total IgE 242.4 with broad elevations for common inhalant allergens. PFT 07/05/08- FVC 3.51/  88%, FEV1 2.38/ 78%, R 0.68, 25-75% 1.45/ 44%, TLC 83%, DLCO 68% PFT 10/21/20- Moderate obstructive airways disease with slight respnse to bronchodilator. Normal Diffusion. FEV!/FVC 0.75    Backache, unspecified    Bipolar I disorder 06/13/2008   Chronic back  pain 06/13/2008   Chronic respiratory failure with hypoxia 05/06/2020   Stable with routine use of O2 2L during sleep. Nocturnal hypoxemia with normal daytime oxygenation on room air.    Class 3 severe obesity due to excess calories with serious comorbidity and body mass index (BMI) of 40.0 to 44.9 in adult 12/19/2018   Closed left ankle fracture    DDD (degenerative disc disease), lumbosacral 10/03/2013   Disorder of sacrum 11/03/2013   Dysphagia, pharyngoesophageal phase 09/12/2008   Elevated LFTs 10/15/2010   Female pelvic peritoneal adhesions 12/19/2018   Fibromyalgia    Galactorrhea not associated with childbirth 12/19/2018   Generalized anxiety disorder 04/05/2012   GERD (gastroesophageal reflux disease) 07/05/2008   Heart disease 12/19/2018   Hiatal hernia 11/03/2013   Hyperglycemia 06/13/2008   Hypertension    Intractable migraine with aura 11/03/2013   LBD (Lewy body dementia) (HCC)    Lumbar radiculopathy 08/30/2012   Lumbosacral spondylosis 11/03/2013   Major depressive disorder 09/08/2019   Major neurocognitive disorder due to multiple etiologies 08/05/2021   Mixed hyperlipidemia 10/04/2019   On home oxygen therapy    nightly at 2l/m prn   Open left ankle fracture 07/16/2020   Oropharyngeal dysphagia 03/01/2018   OSA (obstructive sleep apnea) 06/13/2008   Pain in pelvis 12/19/2018   PONV (postoperative nausea and vomiting)    Schizoaffective disorder, bipolar type 10/04/2019   Seizure disorder    last seizure 2014   Status post open reduction with internal fixation (ORIF) of fracture of ankle 08/26/2020   L ankle   Thyroid disease 10/03/2013   Vitamin D deficiency 10/04/2019   Vocal cord disorder 04/02/2009   Weakness 09/11/2016    Past Surgical History:  Procedure Laterality Date   APPENDECTOMY  1988   laparoscopic   BREAST LUMPECTOMY WITH NEEDLE LOCALIZATION Left 06/06/2015   Procedure: LEFT BREAST LUMPECTOMY WITH NEEDLE LOCALIZATION TIMES TWO;  Surgeon:  Harriette Bouillon, MD;  Location: [facility];  Service: General;  Laterality: Left;   CESAREAN SECTION  1988   twins   CHOLECYSTECTOMY  yrs ago   COLONOSCOPY N/A 06/12/2013   Procedure: COLONOSCOPY;  Surgeon: Hilarie Fredrickson, MD;  Location: [facility];  Service: Endoscopy;  Laterality: N/A;   HERNIA REPAIR  yrs ago   umbilical   ORIF ANKLE FRACTURE Left 07/16/2020   Procedure: OPEN REDUCTION INTERNAL FIXATION (ORIF) ANKLE FRACTURE;  Surgeon: Sheral Apley, MD;  Location: [facility];  Service: Orthopedics;  Laterality: Left;   TUBAL LIGATION  1988   VAGINAL HYSTERECTOMY  2005   complete    Family History  Problem Relation Age of Onset   Heart disease Father    Ovarian cancer Mother    Breast cancer Mother    Alzheimer's disease Sister 33   Colon cancer Neg Hx    Esophageal cancer Neg Hx    Stomach cancer Neg Hx    Rectal cancer Neg Hx     Social History   Socioeconomic History   Marital status: Divorced    Spouse name: Not on file   Number of children: 3   Years of education: 14   Highest education level: Some college, no degree  Occupational History   Occupation:  Disabled  Tobacco Use   Smoking status: Former    Packs/day: 1.00    Years: 5.00    Additional pack years: 0.00    Total pack years: 5.00    Types: Cigarettes    Quit date: 07/07/2007    Years since quitting: 15.3    Passive exposure: Never   Smokeless tobacco: Never  Vaping Use   Vaping Use: Never used  Substance and Sexual Activity   Alcohol use: No   Drug use: No   Sexual activity: Not Currently    Birth control/protection: Surgical  Other Topics Concern   Not on file  Social History Narrative   Single, 3 children   Right handed   Associates degree   occ 1 cup daily      09/01/2018: Lives with sister, Meriam Sprague, who helps manage medications, in 2 story house, and a pit bull   Has one daughter in [place], 2 sons in [place]; 7 grandchildren   Currently not exercising, but wants  to return to water aerobics at [employer]   Financial Resource Strain: Low Risk  (03/12/2022)   Overall Financial Resource Strain (CARDIA)    Difficulty of Paying Living Expenses: Not hard at all  Food Insecurity: No Food Insecurity (03/12/2022)   Hunger Vital Sign    Worried About Running Out of Food in the Last Year: Never true    Ran Out of Food in the Last Year: Never true  Transportation Needs: No Transportation Needs (01/13/2021)   PRAPARE - Administrator, Civil Service (Medical): No    Lack of Transportation (Non-Medical): No  Physical Activity: Insufficiently Active (03/12/2022)   Exercise Vital Sign    Days of Exercise per Week: 5 days    Minutes of Exercise per Session: 20 min  Stress: Stress Concern Present (03/12/2022)   [employer] of Occupational Health - Occupational Stress Questionnaire    Feeling of Stress : To some extent  Social Connections: Moderately Integrated (03/12/2022)   Social Connection and Isolation Panel [NHANES]    Frequency of Communication with Friends and Family: More than three times a week    Frequency of Social Gatherings with Friends and Family: More than three times a week    Attends Religious Services: More than 4 times per year    Active Member of [employer] or Organizations: Yes    Attends Engineer, structural: More than 4 times per year    Marital Status: Divorced     Current Outpatient Medications:    acetaminophen (TYLENOL) 500 MG tablet, Take 1,000 mg by mouth every 6 (six) hours as needed for moderate pain., Disp: , Rfl:    albuterol (PROVENTIL) (2.5 MG/3ML) 0.083% nebulizer solution, Take 3 mLs by nebulization every 4 (four) hours as needed for wheezing or shortness of breath., Disp: 75 mL, Rfl: 12   albuterol (VENTOLIN HFA) 108 (90 Base) MCG/ACT inhaler, TAKE 2 PUFFS BY MOUTH EVERY 6 HOURS AS NEEDED FOR WHEEZE OR SHORTNESS OF BREATH, Disp: 8.5 each, Rfl: 5   aspirin EC 81 MG EC tablet, Take 1  tablet (81 mg total) by mouth 2 (two) times daily. Swallow whole., Disp: 30 tablet, Rfl: 11   benztropine (COGENTIN) 0.5 MG tablet, Take 0.5 mg by mouth daily. , Disp: , Rfl:    Cholecalciferol (VITAMIN D) 50 MCG (2000 UT) CAPS, 1 capsule Orally Once a day for 30 day(s), Disp: , Rfl:    donepezil (ARICEPT) 5 MG tablet,  TAKE 1 TABLET BY MOUTH EVERYDAY AT BEDTIME, Disp: 30 tablet, Rfl: 4   EPINEPHRINE 0.3 mg/0.3 mL IJ SOAJ injection, INJECT 0.3 MG INTO THE MUSCLE DAILY AS NEEDED FOR ANAPHYLAXIS., Disp: 2 each, Rfl: 3   famotidine (PEPCID) 20 MG tablet, Take by mouth., Disp: , Rfl:    FLUoxetine (PROZAC) 40 MG capsule, Take 80 mg by mouth daily., Disp: , Rfl:    HYDROcodone-acetaminophen (NORCO) 10-325 MG tablet, Take by mouth., Disp: , Rfl:    hydrOXYzine (ATARAX/VISTARIL) 25 MG tablet, Take 25 mg by mouth 4 (four) times daily as needed for anxiety or itching., Disp: , Rfl: 2   lamoTRIgine (LAMICTAL) 200 MG tablet, Take 200 mg by mouth at bedtime., Disp: , Rfl:    lurasidone (LATUDA) 20 MG TABS tablet, daily., Disp: , Rfl:    metoprolol succinate (TOPROL-XL) 50 MG 24 hr tablet, Take with or immediately following a meal., Disp: 90 tablet, Rfl: 1   mirtazapine (REMERON) 15 MG tablet, Take 15 mg by mouth at bedtime., Disp: , Rfl:    Multiple Vitamin (MULTIVITAMIN WITH MINERALS) TABS, Take 1 tablet by mouth daily. For nutritional supplementation. (Patient taking differently: Take 1 tablet by mouth daily.), Disp: 30 tablet, Rfl: 0   polyethylene glycol powder (GLYCOLAX/MIRALAX) 17 GM/SCOOP powder, Take 17 g by mouth daily as needed., Disp: 1350 g, Rfl: 3   RESTASIS 0.05 % ophthalmic emulsion, 1 drop 2 (two) times daily., Disp: , Rfl:    tiZANidine (ZANAFLEX) 4 MG tablet, Take 4 mg by mouth 3 (three) times daily as needed for muscle spasms., Disp: , Rfl:    topiramate (TOPAMAX) 50 MG tablet, as needed., Disp: , Rfl:    traMADol (ULTRAM) 50 MG tablet, TAKE 1 TABLET BY MOUTH AT BEDTIME AS NEEDED, Disp: 30  tablet, Rfl: 1   traZODone (DESYREL) 100 MG tablet, as needed., Disp: , Rfl:    TRELEGY ELLIPTA 100-62.5-25 MCG/ACT AEPB, TAKE 1 PUFF BY MOUTH EVERY DAY, Disp: 60 each, Rfl: 3   VITAMIN D PO, daily., Disp: , Rfl:    ziprasidone (GEODON) 20 MG capsule, Take 20 mg by mouth 2 (two) times daily with a meal., Disp: , Rfl:    ondansetron (ZOFRAN) 4 MG tablet, Take 1 tablet (4 mg total) by mouth every 6 (six) hours as needed for nausea. (Patient not taking: Reported on 10/29/2022), Disp: 20 tablet, Rfl: 0  EXAM:  Vitals:   10/29/22 1328  BP: 136/84  Pulse: 74  Temp: 98.1 F (36.7 C)  SpO2: 98%    Body mass index is 38.35 kg/m.  GENERAL: vitals reviewed and listed above, alert, oriented, appears well hydrated and in no acute distress  HEENT: atraumatic, conjunttiva clear, no obvious abnormalities on inspection of external nose and ears  NECK: no obvious masses on inspection, no carotid bruits  LUNGS: clear to auscultation bilaterally, no wheezes, rales or rhonchi, good air movement  CV: HRRR, no peripheral edema, no JVD, BP normal range, normal radial pulses  MS: moves all extremities without noticeable abnormality  PSYCH: pleasant and cooperative, no obvious depression or anxiety  ASSESSMENT AND PLAN:  Discussed the following assessment and plan:  Preop examination - Plan: EKG 12-Lead -EKG with NSR nonspecific T wave changes similar to previous study 09/10/21. -f/u with Neurology and Pulmonology for risk assessment.  Assessment: -Risk factors: none -Surgery Risks:intermediate -age, nutritional status, fraility: good nutritional status, age >62, no fraility -functional capacity: > 4 METs without symptoms -comorbidities: none Patient Specific Risks: patient is low  risk for intermediate risks surgery  Recommendations for optimizing general medical care prior to surgery: -advised patient to discuss specific risks morbidity and mortality of surgery with surgeon, CV risks  discussed with patient -advised patient will defer to surgeon for post-op DVT prophylaxis and post op care -no specific medical recommendations for this patient at this time and no recommendations to defer surgery or for further CV testing prior to surgery -form for pre-op optimization of general medical care prior to surgery faxed to surgeon office  -Patient advised to return or notify a doctor immediately if symptoms worsen or persist or new concerns arise.  F/u prn   Deeann Saint

## 2022-11-02 NOTE — Progress Notes (Unsigned)
@Patient  ID: [name], female    DOB: 23-May-1961, 62 y.o.   MRN: [id number]  No chief complaint on file.   Referring provider: Deeann Saint, MD  HPI: 62 year old female, former smoker quit 2009. PMH significant for hypertension, asthma, OSA, chronic respiratory failure, hiatal hernia, GERD.  11/04/2022 Patient presents today for surgical clearance for right total knee replacement on 11/13/22 with Dr. Margarita Rana.        Allergies  Allergen Reactions   Bee Venom Anaphylaxis   Depakote [Divalproex Sodium] Shortness Of Breath and Rash   Haloperidol Lactate Swelling   Hornet Venom Anaphylaxis   Latex Anaphylaxis    mild rash/itching, wheezing/sob   Peanut-Containing Drug Products Anaphylaxis   Penicillins Anaphylaxis and Swelling   Quetiapine Anaphylaxis and Other (See Comments)   Shellfish-Derived Products Anaphylaxis   Valproic Acid Swelling   Amitriptyline Hcl Other (See Comments)    HALLUCINATIONS   Butorphanol Tartrate Other (See Comments)    Hallucinations   Gabapentin Other (See Comments)    Pt reports severe back and side pain   Iodine Other (See Comments)    Flushing and fainting   Pregabalin Rash   Terbutaline Rash    Immunization History  Administered Date(s) Administered   Influenza Split 04/06/2012, 03/06/2013   Influenza Whole 04/08/2009, 04/05/2010   Influenza,inj,Quad PF,6+ Mos 03/22/2013, 09/01/2018, 04/24/2020, 08/17/2022   PFIZER(Purple Top)SARS-COV-2 Vaccination 09/30/2019, 10/21/2019   Pneumococcal Polysaccharide-23 04/15/2009, 07/07/2011, 04/06/2012    Past Medical History:  Diagnosis Date   Abdominal pain 09/27/2009   Allergic rhinitis 09/20/2008   Allergy skin test 10/10/08 Allergy Profile 01/18/2013-total IgE 242.4 with broad elevations for common inhalant allergens.    Arthritis    oa   Asthma, moderate persistent 06/04/2009   Allergy Profile 01/18/2013-total IgE 242.4 with broad elevations for common inhalant  allergens. PFT 07/05/08- FVC 3.51/ 88%, FEV1 2.38/ 78%, R 0.68, 25-75% 1.45/ 44%, TLC 83%, DLCO 68% PFT 10/21/20- Moderate obstructive airways disease with slight respnse to bronchodilator. Normal Diffusion. FEV!/FVC 0.75    Backache, unspecified    Bipolar I disorder 06/13/2008   Chronic back pain 06/13/2008   Chronic respiratory failure with hypoxia 05/06/2020   Stable with routine use of O2 2L during sleep. Nocturnal hypoxemia with normal daytime oxygenation on room air.    Class 3 severe obesity due to excess calories with serious comorbidity and body mass index (BMI) of 40.0 to 44.9 in adult 12/19/2018   Closed left ankle fracture    DDD (degenerative disc disease), lumbosacral 10/03/2013   Disorder of sacrum 11/03/2013   Dysphagia, pharyngoesophageal phase 09/12/2008   Elevated LFTs 10/15/2010   Female pelvic peritoneal adhesions 12/19/2018   Fibromyalgia    Galactorrhea not associated with childbirth 12/19/2018   Generalized anxiety disorder 04/05/2012   GERD (gastroesophageal reflux disease) 07/05/2008   Heart disease 12/19/2018   Hiatal hernia 11/03/2013   Hyperglycemia 06/13/2008   Hypertension    Intractable migraine with aura 11/03/2013   LBD (Lewy body dementia) (HCC)    Lumbar radiculopathy 08/30/2012   Lumbosacral spondylosis 11/03/2013   Major depressive disorder 09/08/2019   Major neurocognitive disorder due to multiple etiologies 08/05/2021   Mixed hyperlipidemia 10/04/2019   On home oxygen therapy    nightly at 2l/m prn   Open left ankle fracture 07/16/2020   Oropharyngeal dysphagia 03/01/2018   OSA (obstructive sleep apnea) 06/13/2008   Pain in pelvis 12/19/2018   PONV (postoperative nausea and vomiting)    Schizoaffective disorder, bipolar type 10/04/2019  Seizure disorder    last seizure 2014   Status post open reduction with internal fixation (ORIF) of fracture of ankle 08/26/2020   L ankle   Thyroid disease 10/03/2013   Vitamin D deficiency 10/04/2019    Vocal cord disorder 04/02/2009   Weakness 09/11/2016    Tobacco History: Social History   Tobacco Use  Smoking Status Former   Packs/day: 1.00   Years: 5.00   Additional pack years: 0.00   Total pack years: 5.00   Types: Cigarettes   Quit date: 07/07/2007   Years since quitting: 15.3   Passive exposure: Never  Smokeless Tobacco Never   Counseling given: Not Answered   Outpatient Medications Prior to Visit  Medication Sig Dispense Refill   acetaminophen (TYLENOL) 500 MG tablet Take 1,000 mg by mouth every 6 (six) hours as needed for moderate pain.     albuterol (PROVENTIL) (2.5 MG/3ML) 0.083% nebulizer solution Take 3 mLs by nebulization every 4 (four) hours as needed for wheezing or shortness of breath. 75 mL 12   albuterol (VENTOLIN HFA) 108 (90 Base) MCG/ACT inhaler TAKE 2 PUFFS BY MOUTH EVERY 6 HOURS AS NEEDED FOR WHEEZE OR SHORTNESS OF BREATH 8.5 each 5   aspirin EC 81 MG EC tablet Take 1 tablet (81 mg total) by mouth 2 (two) times daily. Swallow whole. 30 tablet 11   benztropine (COGENTIN) 0.5 MG tablet Take 0.5 mg by mouth daily.      Cholecalciferol (VITAMIN D) 50 MCG (2000 UT) CAPS 1 capsule Orally Once a day for 30 day(s)     donepezil (ARICEPT) 5 MG tablet TAKE 1 TABLET BY MOUTH EVERYDAY AT BEDTIME 30 tablet 4   EPINEPHRINE 0.3 mg/0.3 mL IJ SOAJ injection INJECT 0.3 MG INTO THE MUSCLE DAILY AS NEEDED FOR ANAPHYLAXIS. 2 each 3   famotidine (PEPCID) 20 MG tablet Take by mouth.     FLUoxetine (PROZAC) 40 MG capsule Take 80 mg by mouth daily.     HYDROcodone-acetaminophen (NORCO) 10-325 MG tablet Take by mouth.     hydrOXYzine (ATARAX/VISTARIL) 25 MG tablet Take 25 mg by mouth 4 (four) times daily as needed for anxiety or itching.  2   lamoTRIgine (LAMICTAL) 200 MG tablet Take 200 mg by mouth at bedtime.     lurasidone (LATUDA) 20 MG TABS tablet daily.     metoprolol succinate (TOPROL-XL) 50 MG 24 hr tablet Take with or immediately following a meal. 90 tablet 1    mirtazapine (REMERON) 15 MG tablet Take 15 mg by mouth at bedtime.     Multiple Vitamin (MULTIVITAMIN WITH MINERALS) TABS Take 1 tablet by mouth daily. For nutritional supplementation. (Patient taking differently: Take 1 tablet by mouth daily.) 30 tablet 0   ondansetron (ZOFRAN) 4 MG tablet Take 1 tablet (4 mg total) by mouth every 6 (six) hours as needed for nausea. (Patient not taking: Reported on 10/29/2022) 20 tablet 0   polyethylene glycol powder (GLYCOLAX/MIRALAX) 17 GM/SCOOP powder Take 17 g by mouth daily as needed. 1350 g 3   RESTASIS 0.05 % ophthalmic emulsion 1 drop 2 (two) times daily.     tiZANidine (ZANAFLEX) 4 MG tablet Take 4 mg by mouth 3 (three) times daily as needed for muscle spasms.     topiramate (TOPAMAX) 50 MG tablet as needed.     traMADol (ULTRAM) 50 MG tablet TAKE 1 TABLET BY MOUTH AT BEDTIME AS NEEDED 30 tablet 1   traZODone (DESYREL) 100 MG tablet as needed.     TRELEGY  ELLIPTA 100-62.5-25 MCG/ACT AEPB TAKE 1 PUFF BY MOUTH EVERY DAY 60 each 3   VITAMIN D PO daily.     ziprasidone (GEODON) 20 MG capsule Take 20 mg by mouth 2 (two) times daily with a meal.     No facility-administered medications prior to visit.      Review of Systems  Review of Systems   Physical Exam  There were no vitals taken for this visit. Physical Exam   Lab Results:  CBC    Component Value Date/Time   WBC 4.4 10/14/2021 0926   RBC 4.78 10/14/2021 0926   HGB 12.9 10/14/2021 0926   HCT 39.4 10/14/2021 0926   PLT 282.0 10/14/2021 0926   MCV 82.3 10/14/2021 0926   MCH 26.5 07/09/2020 2008   MCHC 32.7 10/14/2021 0926   RDW 15.6 (H) 10/14/2021 0926   LYMPHSABS 2.3 08/04/2021 1050   MONOABS 0.4 08/04/2021 1050   EOSABS 0.2 08/04/2021 1050   BASOSABS 0.0 08/04/2021 1050    BMET    Component Value Date/Time   NA 137 10/14/2021 0926   K 4.1 10/14/2021 0926   CL 100 10/14/2021 0926   CO2 26 10/14/2021 0926   GLUCOSE 121 (H) 10/14/2021 0926   BUN 11 10/14/2021 0926    CREATININE 0.97 10/14/2021 0926   CREATININE 1.10 (H) 04/24/2020 1107   CALCIUM 9.4 10/14/2021 0926   GFRNONAA >60 07/09/2020 2008   GFRNONAA 55 (L) 04/24/2020 1107   GFRAA 64 04/24/2020 1107    BNP No results found for: "BNP"  ProBNP    Component Value Date/Time   PROBNP 12.4 12/25/2011 1540    Imaging: No results found.   Assessment & Plan:   No problem-specific Assessment & Plan notes found for this encounter.    1) RISK FOR PROLONGED MECHANICAL VENTILAION - > 48h  1A) Arozullah - Prolonged mech ventilation risk Arozullah Postperative Pulmonary Risk Score - for mech ventilation dependence >48h USAA, Ann Surg 2000, major non-cardiac surgery) Comment Score  Type of surgery - abd ao aneurysm (27), thoracic (21), neurosurgery / upper abdominal / vascular (21), neck (11) *** ***  Emergency Surgery - (11)    ALbumin < 3 or poor nutritional state - (9)    BUN > 30 -  (8)    Partial or completely dependent functional status - (7)    COPD -  (6)    Age - 60 to 55 (4), > 70  (6)    TOTAL    Risk Stratifcation scores  - < 10 (0.5%), 11-19 (1.8%), 20-27 (4.2%), 28-40 (10.1%), >40 (26.6%)        1B) GUPTA - Prolonged Mech Vent Risk Score source Risk  Guptal post op prolonged mech ventilation > 48h or reintubation < 30 days - ACS 2007-2008 dataset - [email or web address] ***    2) RISK FOR POST OP PNEUMONIA Score source Risk  Chales Abrahams - Post Op Pnemounia risk  [email or web address] ***    R3) ISK FOR ANY POST-OP PULMONARY COMPLICATION Score source Risk  CANET/ARISCAT Score - risk for ANY/ALl pulmonary complications - > risk of in-hospital post-op pulmonary complications (composite including respiratory failure, respiratory infection, pleural effusion, atelectasis, pneumothorax, bronchospasm, aspiration pneumonitis)  [email or web address] - based on age, anemia, pulse ox, resp infection prior 30d, incision site, duration of surgery, and emergency v elective surgery ***     Glenford Bayley, NP 11/02/2022

## 2022-11-03 NOTE — Progress Notes (Unsigned)
NEUROLOGY FOLLOW UP OFFICE NOTE  [name] [id number]  Assessment/Plan:   Major neurocognitive disorder secondary to Lewy body dementia Seizure disorder, stable.  EEG normal.  Off AED. Migraine without aura, without status migrainosus, not intractable White matter lesions on brain MRI.  Prior diagnosis of MS.  Never was on DMT.  My assessment is that findings are chronic small vessel ischemic changes and not demyelinating disease     Start rivastigmine 1.5mg  twice daily with plan to increase to 3mg  twice daily in 4 weeks. Monitor for disease progression.  Would not treat hallucinations as they are not negatively impacting her. Follow up 6 months.     Subjective:  [name] is a 62 year old right-handed woman with seizure disorder, questionable multiple sclerosis, fibromyalgia, Bipolar disorder, depression and OSA who follows up for seizure disorder, migraines and cognitive disorder.  She is accompanied by her sister via phone.   UPDATE: Lives with sister who drives her to appointments and helps with her daily activities.  Started donepezil.  ***   HISTORY: I  SEIZURE DISORDER: She has had history of seizures since childhood.  Previously took phenobarbital until age 75.  She was seizure-free for years until December 2014, in which she had 2 suspected seizures followed by a third seizure in February 2015.  She was noted by her sister to be unresponsive, shaking with eyes rolled back with tongue biting lasting 3 minutes.  No bowel or bladder incontinence.  Afterwards, she was confused and agitated.  She was started on Keppra 500mg  twice daily.  No recurrent seizures since 2015.  Keppra was subsequently discontinued after several years.  She had a repeat MRI of brain on 01/20/2019 which demonstrated mild generalized atrophy and again mild nonspecific cerebral white matter disease, similar to prior MRI from 10/18/2013. Awake and asleep EEG from 05/05/2019 was normal.  Therefore,  Keppra was not restarted.   II QUESTIONABLE HISTORY OF MULTIPLE SCLEROSIS, UNLIKELY.  MRI BRAIN FINDINGS MORE CONSISTENT WITH SMALL VESSEL DISEASE:  She was diagnosed with MS in 2000 in [place] after experiencing visual changes, unsteady gait and difficulty with fine motor skills.  She took Betaseron until 2005.  MRI of brain with and without contrast from 11/03/04 reportedly demonstrated "no lesion indentified in the brain which would be characteristic for multiple sclerosis."  MRI of brain with and without contrast from 01/29/11 was personally reviewed and demonstrated mild non-enhancing nonspecific punctate T2 hyperintensities in the subcortical white matter, mildly increased on repeat study on 10/18/13.  She was admitted to [facility] on 09/11/16 for bilateral lower extremity weakness with LLQ abdominal pain and nausea.  MRI of cervical, thoracic and lumbar spine were personally reviewed and demonstrated no demyelinating spinal cord disease with mild disc bulging and facet hypertrophy at L3-4, L4-5 and L5-S1, but no significant spinal stenosis.  HIV testing was negative.   III LEWY BODY DEMENTIA: She started having short-term memory problems in December 2019, however I presume symptoms started earlier.  At the time, her family noticed the problem.  She will lose her train of thought mid-sentence or she will forget why she walked into a room.  She is told that she keeps repeating questions.  She lives with her sister.  Her sister took away her car keys because she got confused driving on a familiar route.  One time, she gave a cab driver an extra $33.  She is now having trouble paying her bills and requires help from her sister.  She reports needing help bathing and getting dress.  She started putting on clothes backwards.  Her daughter got married in Spring 2019 and she cannot remember the wedding.  She reports hallucinations.  She woke up from a dream and turned over to briefly find a baby in her bed.   She underwent neuropsychological testing at [facility] on 11/23/18.  She did demonstrated reduced effort but no clear evidence of Alzheimer's disease or impairment often associated with MS.  Likely subjective memory deficits related to psychogenic factors but ultimately unclear.  She underwent repeat neuropsychological testing on 09/07/2019 demonstrated diffuse mild neurocognitive disorder of unclear etiology, possibly due to psychiatric factors as she demonstrated severe anxiety and moderate depression, but not definite.  She appeared to exhibit visual deficits on exam, skipping many items unintentionally and often circled responses of the bottom half of one answer and the top half of another answer.  In 2022, she reports slight worsening in memory.  She reports that sometimes she doesn't recognize close family members.  She may not remember being in houses that she has been to in the past.  She lives with two of her sisters.  Her sister and daughter manage her bills and finances.  She sometimes sees people in the house who aren't there.  Sometimes, she makes paraphasic errors.  She may call a tree by a different name.  She does not drive.  She underwent repeat neuropsychological evaluation on 08/05/2021 which demonstrated significant cognitive decline compared to prior evaluation with diffuse and severe cognitive impairment meeting criteria for major neurocognitive disorder.  Etiology unclear and likely multifactorial but there is concern for Lewy Body Dementia (formed hallucinations, REM sleep behavior disorder, visual spatial and executive dysfunction) as well as possible Alzheimer's disease (amnestic across memory testing).  Labs from 07/20/18 include B12 479, folate 17.8, TSH 0.43. DaT scan on 10/22/2021 personally reviewed revealed asymmetric decreased radiotracer activity in the right striatum.  Serum labs from 10/14/2021 demonstrated elevated sed rate 70 with normal CRP 1.3, normal ammonia 40, non-reactive HIV,  non-reactive RPR, negative ANA, normal CBC, normal CMP (except elevated glucose 121).  Underwent LP for CSF analysis in May which demonstrated cell count 4; protein 46; glucose 74; negative VDRL; normal IgG synthesis rate and index; greater than 5 oligoclonal bands in both CSF and serum; negative cryptococcal antigen; <0.2% probability of prion disease with elevated 14-3-3 protein of 5.056 but negative RT-QuIC, and T-TAU protein 299; negative culture; negative fungal culture; negative HSV 1/2 DNA; negative Mayo dementia autoimmune paraneoplastic panel; negative [facility] Alzheimer's Disease evaluation (p-Tau/Abeta42 0.013, Abeta42 1232, total-Tau 176, Phospho-Tau 16.5); negative cytology.   Both of her parents had Alzheimer's disease (in their 3s) and her sister was diagnosed in her early 35s.  Past medication:  rivastigmine (side effects).   IV CHRONIC LOW-BACK PAIN: She has chronic right sided low back pain radiating down the right leg.  MRI of lumbar spine in 2018 demonstrated mild disc bulging and facet hypertrophy at L3-4, L4-5 and L5-S1.  She was evaluated by orthopedics at [facility] and underwent conservative management.  Repeat MRI lumbar spine from 01/15/2020 showed interval progression of multilevel spondylosis with central L5-S1 disc protrusion abutting left greater than right S1 nerve roots and left extraforaminal L4-5 annular fissuring.  She is undergoing injections.              V HEADACHES: She has history of migraines but headaches started getting worse in September 2019.  She reports severe frontal or temporal  pressure pain.  No preceding aura.  There is associated nausea, photophobia but no vomiting, phonophobia, visual disturbance or unilateral numbness or weakness.  They usually last 3 to 4 hours.  They are infrequent and she hasn't had one in several months.  She usually treats it with Motrin and Flexeril.   Current NSAIDs:  Motrin Current analgesics:  none Current muscle relaxant:   Flexeril Current antihypertensive:  metoprolol Current antidepressant:  Doxepin 20mg  at bedtime, Prozac Current antiepileptic:  Lamotrigine 25mg  at bedtime Current sleep aid:  Trazodone Other medications:  Trazodone, Stelazine, Cogentin   Past antiemetic:  Reglan Past muscle relaxant:  Robaxin Past antidepressant:  Cymbalta, amitriptyline Past antiepileptic:  topiramate 100mg  twice daily, gabapentin Other past medications:  Latuda, Seroquel, Sonata, Ambien  PAST MEDICAL HISTORY: Past Medical History:  Diagnosis Date   Abdominal pain 09/27/2009   Allergic rhinitis 09/20/2008   Allergy skin test 10/10/08 Allergy Profile 01/18/2013-total IgE 242.4 with broad elevations for common inhalant allergens.    Arthritis    oa   Asthma, moderate persistent 06/04/2009   Allergy Profile 01/18/2013-total IgE 242.4 with broad elevations for common inhalant allergens. PFT 07/05/08- FVC 3.51/ 88%, FEV1 2.38/ 78%, R 0.68, 25-75% 1.45/ 44%, TLC 83%, DLCO 68% PFT 10/21/20- Moderate obstructive airways disease with slight respnse to bronchodilator. Normal Diffusion. FEV!/FVC 0.75    Backache, unspecified    Bipolar I disorder 06/13/2008   Chronic back pain 06/13/2008   Chronic respiratory failure with hypoxia 05/06/2020   Stable with routine use of O2 2L during sleep. Nocturnal hypoxemia with normal daytime oxygenation on room air.    Class 3 severe obesity due to excess calories with serious comorbidity and body mass index (BMI) of 40.0 to 44.9 in adult 12/19/2018   Closed left ankle fracture    DDD (degenerative disc disease), lumbosacral 10/03/2013   Disorder of sacrum 11/03/2013   Dysphagia, pharyngoesophageal phase 09/12/2008   Elevated LFTs 10/15/2010   Female pelvic peritoneal adhesions 12/19/2018   Fibromyalgia    Galactorrhea not associated with childbirth 12/19/2018   Generalized anxiety disorder 04/05/2012   GERD (gastroesophageal reflux disease) 07/05/2008   Heart disease 12/19/2018    Hiatal hernia 11/03/2013   Hyperglycemia 06/13/2008   Hypertension    Intractable migraine with aura 11/03/2013   LBD (Lewy body dementia) (HCC)    Lumbar radiculopathy 08/30/2012   Lumbosacral spondylosis 11/03/2013   Major depressive disorder 09/08/2019   Major neurocognitive disorder due to multiple etiologies 08/05/2021   Mixed hyperlipidemia 10/04/2019   On home oxygen therapy    nightly at 2l/m prn   Open left ankle fracture 07/16/2020   Oropharyngeal dysphagia 03/01/2018   OSA (obstructive sleep apnea) 06/13/2008   Pain in pelvis 12/19/2018   PONV (postoperative nausea and vomiting)    Schizoaffective disorder, bipolar type 10/04/2019   Seizure disorder    last seizure 2014   Status post open reduction with internal fixation (ORIF) of fracture of ankle 08/26/2020   L ankle   Thyroid disease 10/03/2013   Vitamin D deficiency 10/04/2019   Vocal cord disorder 04/02/2009   Weakness 09/11/2016    MEDICATIONS: Current Outpatient Medications on File Prior to Visit  Medication Sig Dispense Refill   acetaminophen (TYLENOL) 500 MG tablet Take 1,000 mg by mouth every 6 (six) hours as needed for moderate pain.     albuterol (PROVENTIL) (2.5 MG/3ML) 0.083% nebulizer solution Take 3 mLs by nebulization every 4 (four) hours as needed for wheezing or shortness of breath. 75 mL  12   albuterol (VENTOLIN HFA) 108 (90 Base) MCG/ACT inhaler TAKE 2 PUFFS BY MOUTH EVERY 6 HOURS AS NEEDED FOR WHEEZE OR SHORTNESS OF BREATH 8.5 each 5   aspirin EC 81 MG EC tablet Take 1 tablet (81 mg total) by mouth 2 (two) times daily. Swallow whole. 30 tablet 11   benztropine (COGENTIN) 0.5 MG tablet Take 0.5 mg by mouth daily.      Cholecalciferol (VITAMIN D) 50 MCG (2000 UT) CAPS 1 capsule Orally Once a day for 30 day(s)     donepezil (ARICEPT) 5 MG tablet TAKE 1 TABLET BY MOUTH EVERYDAY AT BEDTIME 30 tablet 4   EPINEPHRINE 0.3 mg/0.3 mL IJ SOAJ injection INJECT 0.3 MG INTO THE MUSCLE DAILY AS NEEDED FOR  ANAPHYLAXIS. 2 each 3   famotidine (PEPCID) 20 MG tablet Take by mouth.     FLUoxetine (PROZAC) 40 MG capsule Take 80 mg by mouth daily.     HYDROcodone-acetaminophen (NORCO) 10-325 MG tablet Take by mouth.     hydrOXYzine (ATARAX/VISTARIL) 25 MG tablet Take 25 mg by mouth 4 (four) times daily as needed for anxiety or itching.  2   lamoTRIgine (LAMICTAL) 200 MG tablet Take 200 mg by mouth at bedtime.     lurasidone (LATUDA) 20 MG TABS tablet daily.     metoprolol succinate (TOPROL-XL) 50 MG 24 hr tablet Take with or immediately following a meal. 90 tablet 1   mirtazapine (REMERON) 15 MG tablet Take 15 mg by mouth at bedtime.     Multiple Vitamin (MULTIVITAMIN WITH MINERALS) TABS Take 1 tablet by mouth daily. For nutritional supplementation. (Patient taking differently: Take 1 tablet by mouth daily.) 30 tablet 0   ondansetron (ZOFRAN) 4 MG tablet Take 1 tablet (4 mg total) by mouth every 6 (six) hours as needed for nausea. (Patient not taking: Reported on 10/29/2022) 20 tablet 0   polyethylene glycol powder (GLYCOLAX/MIRALAX) 17 GM/SCOOP powder Take 17 g by mouth daily as needed. 1350 g 3   RESTASIS 0.05 % ophthalmic emulsion 1 drop 2 (two) times daily.     tiZANidine (ZANAFLEX) 4 MG tablet Take 4 mg by mouth 3 (three) times daily as needed for muscle spasms.     topiramate (TOPAMAX) 50 MG tablet as needed.     traMADol (ULTRAM) 50 MG tablet TAKE 1 TABLET BY MOUTH AT BEDTIME AS NEEDED 30 tablet 1   traZODone (DESYREL) 100 MG tablet as needed.     TRELEGY ELLIPTA 100-62.5-25 MCG/ACT AEPB TAKE 1 PUFF BY MOUTH EVERY DAY 60 each 3   VITAMIN D PO daily.     ziprasidone (GEODON) 20 MG capsule Take 20 mg by mouth 2 (two) times daily with a meal.     No current facility-administered medications on file prior to visit.    ALLERGIES: Allergies  Allergen Reactions   Bee Venom Anaphylaxis   Depakote [Divalproex Sodium] Shortness Of Breath and Rash   Haloperidol Lactate Swelling   Hornet Venom  Anaphylaxis   Latex Anaphylaxis    mild rash/itching, wheezing/sob   Peanut-Containing Drug Products Anaphylaxis   Penicillins Anaphylaxis and Swelling   Quetiapine Anaphylaxis and Other (See Comments)   Shellfish-Derived Products Anaphylaxis   Valproic Acid Swelling   Amitriptyline Hcl Other (See Comments)    HALLUCINATIONS   Butorphanol Tartrate Other (See Comments)    Hallucinations   Gabapentin Other (See Comments)    Pt reports severe back and side pain   Iodine Other (See Comments)    Flushing and  fainting   Pregabalin Rash   Terbutaline Rash    FAMILY HISTORY: Family History  Problem Relation Age of Onset   Heart disease Father    Ovarian cancer Mother    Breast cancer Mother    Alzheimer's disease Sister 23   Colon cancer Neg Hx    Esophageal cancer Neg Hx    Stomach cancer Neg Hx    Rectal cancer Neg Hx       Objective:  *** General: No acute distress.  Patient appears well-groomed.   Heart:  RRR Neuro:  ***    Shon Millet, DO  CC: Abbe Amsterdam, MD

## 2022-11-04 ENCOUNTER — Ambulatory Visit (INDEPENDENT_AMBULATORY_CARE_PROVIDER_SITE_OTHER): Payer: Medicare HMO

## 2022-11-04 ENCOUNTER — Encounter: Payer: Self-pay | Admitting: Primary Care

## 2022-11-04 ENCOUNTER — Ambulatory Visit (INDEPENDENT_AMBULATORY_CARE_PROVIDER_SITE_OTHER): Payer: Medicare HMO | Admitting: Primary Care

## 2022-11-04 VITALS — BP 136/94 | HR 77 | Temp 97.7°F | Ht 69.0 in | Wt 254.4 lb

## 2022-11-04 DIAGNOSIS — J45909 Unspecified asthma, uncomplicated: Secondary | ICD-10-CM | POA: Diagnosis not present

## 2022-11-04 DIAGNOSIS — Z01811 Encounter for preprocedural respiratory examination: Secondary | ICD-10-CM

## 2022-11-04 DIAGNOSIS — G4734 Idiopathic sleep related nonobstructive alveolar hypoventilation: Secondary | ICD-10-CM | POA: Diagnosis not present

## 2022-11-04 DIAGNOSIS — J4540 Moderate persistent asthma, uncomplicated: Secondary | ICD-10-CM | POA: Diagnosis not present

## 2022-11-04 DIAGNOSIS — Z01818 Encounter for other preprocedural examination: Secondary | ICD-10-CM | POA: Diagnosis not present

## 2022-11-04 NOTE — Telephone Encounter (Signed)
OV notes and clearance form have been faxed back to [facility]. Nothing further needed at this time.  

## 2022-11-04 NOTE — Patient Instructions (Addendum)
You are optimized for surgery from pulmonary standpoint Continue Trelegy inhaler one puff daily  Encourage early ambulation, compression stocking and use incentive spirometer post-op  We will call you with CXR results   Follow-up: 6 months with Dr. Maple Hudson or sooner if needed

## 2022-11-04 NOTE — Assessment & Plan Note (Signed)
-   Stable; No acute respiratory symptoms or recent exacerbations. She is maintained on Trelegy Ellipta. Rarely requires Albuterol.

## 2022-11-04 NOTE — Assessment & Plan Note (Addendum)
-   No longer using nocturnal oxygen. O2 99% RA today. Sleeping well without overt apnea symptoms. May want to consider getting follow up sleep study at next OV.

## 2022-11-04 NOTE — Assessment & Plan Note (Addendum)
-   Patient is low-moderate risk for prolonged mechanical ventilation and/or post op pulmonary complications. Exam benign an VSS. CXR obtained, pending results. She is optimized from a pulmonary standpoint for knee surgery on 11/13/22 with Dr. Eulah Pont. Encourage early ambulation, compression stockings and IS use every hour while awake after surgery.

## 2022-11-05 ENCOUNTER — Ambulatory Visit: Payer: Medicare HMO | Admitting: Neurology

## 2022-11-05 ENCOUNTER — Encounter: Payer: Self-pay | Admitting: Neurology

## 2022-11-05 VITALS — BP 147/80 | HR 75 | Ht 69.0 in | Wt 253.0 lb

## 2022-11-05 DIAGNOSIS — F02A2 Dementia in other diseases classified elsewhere, mild, with psychotic disturbance: Secondary | ICD-10-CM

## 2022-11-05 DIAGNOSIS — G3183 Dementia with Lewy bodies: Secondary | ICD-10-CM

## 2022-11-05 DIAGNOSIS — G43009 Migraine without aura, not intractable, without status migrainosus: Secondary | ICD-10-CM | POA: Diagnosis not present

## 2022-11-05 DIAGNOSIS — Z8669 Personal history of other diseases of the nervous system and sense organs: Secondary | ICD-10-CM | POA: Diagnosis not present

## 2022-11-05 MED ORDER — DONEPEZIL HCL 5 MG PO TABS: ORAL_TABLET | ORAL | 0 refills | Status: DC

## 2022-11-05 NOTE — Patient Instructions (Signed)
You are not to take rivastigmine. Restart donepezil 5mg  - take 1 tablet at bedtime for one week, then increase to 2 tablets at bedtime.  For refill, I will send in a 10mg  tablet at bedtime.

## 2022-11-09 NOTE — Progress Notes (Signed)
Please let patient know chest x-ray showed no acute chest findings, lungs clear.

## 2022-11-10 ENCOUNTER — Other Ambulatory Visit: Payer: Self-pay | Admitting: Internal Medicine

## 2022-11-10 ENCOUNTER — Telehealth: Payer: Self-pay | Admitting: Family Medicine

## 2022-11-10 ENCOUNTER — Other Ambulatory Visit: Payer: Self-pay | Admitting: Neurology

## 2022-11-10 ENCOUNTER — Telehealth: Payer: Self-pay | Admitting: Neurology

## 2022-11-10 MED ORDER — DONEPEZIL HCL 10 MG PO TABS: 10.0000 mg | ORAL_TABLET | Freq: Every day | ORAL | 5 refills | Status: AC

## 2022-11-10 NOTE — Telephone Encounter (Signed)
Prescription Request  11/10/2022  LOV: 10/29/2022  What is the name of the medication or equipment? traMADol (ULTRAM) 50 MG tablet   Have you contacted your pharmacy to request a refill? Yes   Which pharmacy would you like this sent to?  [id number] [place], [place] - [address]  [place] [address] Phone: [phone] Fax: [phone]    Patient notified that their request is being sent to the clinical staff for review and that they should receive a response within 2 business days.

## 2022-11-10 NOTE — Telephone Encounter (Signed)
Pt called in and left a message. She stated a prescription was supposed to be sent in on Monday when she was here, but her pharmacy does not have it. She did not specify the medication or pharmacy.

## 2022-11-10 NOTE — Telephone Encounter (Signed)
LMOVM Donepezil [facility] on [address]   [facility] to [facility], Per Rep the insurance will not cover two tablets.   Rep will fill for the One week 5 mg please send in the 10 mg script.

## 2022-11-11 NOTE — Telephone Encounter (Signed)
Patient advised to call and make sure I able to pick up , Donepezil 10mg  at bedtime [facility] on [address]

## 2022-11-11 NOTE — Telephone Encounter (Signed)
Refill requested too early.  Shouldn't need until 11/01/22.

## 2022-11-12 DIAGNOSIS — F250 Schizoaffective disorder, bipolar type: Secondary | ICD-10-CM | POA: Diagnosis not present

## 2022-11-16 ENCOUNTER — Ambulatory Visit: Payer: Medicare HMO | Admitting: Family Medicine

## 2022-11-25 DIAGNOSIS — M25561 Pain in right knee: Secondary | ICD-10-CM | POA: Diagnosis not present

## 2022-11-26 NOTE — H&P (Signed)
KNEE ARTHROPLASTY ADMISSION H&P  Patient ID: [name] MRN: [id number] DOB/AGE: 01-05-1961 62 y.o.  Chief Complaint: right knee pain.  Planned Procedure Date: 12/22/22 Medical and Cardiac Clearance by Dr. Abbe Amsterdam   Neurology Clearance by Dr. Shon Millet Pulmonology clearance by Dr. [name]   HPI: [name] is a 62 y.o. female who presents for evaluation of OA RIGHT KNEE. The patient has a history of pain and functional disability in the right knee due to arthritis and has failed non-surgical conservative treatments for greater than 12 weeks to include NSAID's and/or analgesics, corticosteriod injections, use of assistive devices, weight reduction as appropriate, and activity modification.  Onset of symptoms was gradual, starting 3 years ago with gradually worsening course since that time. The patient noted no past surgery on the right knee.  Patient currently rates pain at 9 out of 10 with activity. Patient has night pain, worsening of pain with activity and weight bearing, and pain that interferes with activities of daily living.  Patient has evidence of subchondral sclerosis, periarticular osteophytes, and joint space narrowing by imaging studies.  There is no active infection.  Past Medical History:  Diagnosis Date   Abdominal pain 09/27/2009   Allergic rhinitis 09/20/2008   Allergy skin test 10/10/08 Allergy Profile 01/18/2013-total IgE 242.4 with broad elevations for common inhalant allergens.    Arthritis    oa   Asthma, moderate persistent 06/04/2009   Allergy Profile 01/18/2013-total IgE 242.4 with broad elevations for common inhalant allergens. PFT 07/05/08- FVC 3.51/ 88%, FEV1 2.38/ 78%, R 0.68, 25-75% 1.45/ 44%, TLC 83%, DLCO 68% PFT 10/21/20- Moderate obstructive airways disease with slight respnse to bronchodilator. Normal Diffusion. FEV!/FVC 0.75    Backache, unspecified    Bipolar I disorder 06/13/2008   Chronic back pain 06/13/2008   Chronic  respiratory failure with hypoxia 05/06/2020   Stable with routine use of O2 2L during sleep. Nocturnal hypoxemia with normal daytime oxygenation on room air.    Class 3 severe obesity due to excess calories with serious comorbidity and body mass index (BMI) of 40.0 to 44.9 in adult 12/19/2018   Closed left ankle fracture    DDD (degenerative disc disease), lumbosacral 10/03/2013   Disorder of sacrum 11/03/2013   Dysphagia, pharyngoesophageal phase 09/12/2008   Elevated LFTs 10/15/2010   Female pelvic peritoneal adhesions 12/19/2018   Fibromyalgia    Galactorrhea not associated with childbirth 12/19/2018   Generalized anxiety disorder 04/05/2012   GERD (gastroesophageal reflux disease) 07/05/2008   Heart disease 12/19/2018   Hiatal hernia 11/03/2013   Hyperglycemia 06/13/2008   Hypertension    Intractable migraine with aura 11/03/2013   LBD (Lewy body dementia) (HCC)    Lumbar radiculopathy 08/30/2012   Lumbosacral spondylosis 11/03/2013   Major depressive disorder 09/08/2019   Major neurocognitive disorder due to multiple etiologies 08/05/2021   Mixed hyperlipidemia 10/04/2019   On home oxygen therapy    nightly at 2l/m prn   Open left ankle fracture 07/16/2020   Oropharyngeal dysphagia 03/01/2018   OSA (obstructive sleep apnea) 06/13/2008   Pain in pelvis 12/19/2018   PONV (postoperative nausea and vomiting)    Schizoaffective disorder, bipolar type 10/04/2019   Seizure disorder    last seizure 2014   Status post open reduction with internal fixation (ORIF) of fracture of ankle 08/26/2020   L ankle   Thyroid disease 10/03/2013   Vitamin D deficiency 10/04/2019   Vocal cord disorder 04/02/2009   Weakness 09/11/2016   Past Surgical History:  Procedure Laterality Date   APPENDECTOMY  1988   laparoscopic   BREAST LUMPECTOMY WITH NEEDLE LOCALIZATION Left 06/06/2015   Procedure: LEFT BREAST LUMPECTOMY WITH NEEDLE LOCALIZATION TIMES TWO;  Surgeon: Harriette Bouillon, MD;  Location:  [facility];  Service: General;  Laterality: Left;   CESAREAN SECTION  1988   twins   CHOLECYSTECTOMY  yrs ago   COLONOSCOPY N/A 06/12/2013   Procedure: COLONOSCOPY;  Surgeon: Hilarie Fredrickson, MD;  Location: [facility];  Service: Endoscopy;  Laterality: N/A;   HERNIA REPAIR  yrs ago   umbilical   ORIF ANKLE FRACTURE Left 07/16/2020   Procedure: OPEN REDUCTION INTERNAL FIXATION (ORIF) ANKLE FRACTURE;  Surgeon: Sheral Apley, MD;  Location: [facility];  Service: Orthopedics;  Laterality: Left;   TUBAL LIGATION  1988   VAGINAL HYSTERECTOMY  2005   complete   Allergies  Allergen Reactions   Bee Venom Anaphylaxis   Depakote [Divalproex Sodium] Shortness Of Breath and Rash   Haloperidol Lactate Swelling   Hornet Venom Anaphylaxis   Latex Anaphylaxis    mild rash/itching, wheezing/sob   Peanut (Diagnostic) Anaphylaxis   Peanut-Containing Drug Products Anaphylaxis   Penicillins Anaphylaxis and Swelling   Quetiapine Anaphylaxis and Other (See Comments)   Shellfish-Derived Products Anaphylaxis   Valproic Acid Swelling   Butorphanol Tartrate Other (See Comments)    Unknown reaction per patient   Amitriptyline Hcl Other (See Comments)    HALLUCINATIONS   Gabapentin Other (See Comments)    Pt reports severe back and side pain   Iodine Other (See Comments)    Flushing and fainting   Pregabalin Rash   Terbutaline Rash   Prior to Admission medications   Medication Sig Start Date End Date Taking? Authorizing Provider  donepezil (ARICEPT) 10 MG tablet Take 1 tablet (10 mg total) by mouth at bedtime. 11/10/22   Drema Dallas, DO  acetaminophen (TYLENOL) 500 MG tablet Take 1,000 mg by mouth every 6 (six) hours as needed for moderate pain.    [provider]  albuterol (PROVENTIL) (2.5 MG/3ML) 0.083% nebulizer solution Take 3 mLs by nebulization every 4 (four) hours as needed for wheezing or shortness of breath. 12/03/20   Bing Neighbors, NP  albuterol  (VENTOLIN HFA) 108 (90 Base) MCG/ACT inhaler TAKE 2 PUFFS BY MOUTH EVERY 6 HOURS AS NEEDED FOR WHEEZE OR SHORTNESS OF BREATH 11/12/22   [name] D, MD  aspirin EC 81 MG EC tablet Take 1 tablet (81 mg total) by mouth 2 (two) times daily. Swallow whole. 07/18/20   Rainey Pines, PA-C  benztropine (COGENTIN) 0.5 MG tablet Take 0.5 mg by mouth daily.  01/07/18   [provider]  Cholecalciferol (VITAMIN D) 50 MCG (2000 UT) CAPS 1 capsule Orally Once a day for 30 day(s)    [provider]  EPINEPHRINE 0.3 mg/0.3 mL IJ SOAJ injection INJECT 0.3 MG INTO THE MUSCLE DAILY AS NEEDED FOR ANAPHYLAXIS. 05/04/22   [name] D, MD  famotidine (PEPCID) 20 MG tablet Take by mouth. 04/07/12   [provider]  FLUoxetine (PROZAC) 40 MG capsule Take 80 mg by mouth daily.    [provider]  HYDROcodone-acetaminophen [facility]) 10-325 MG tablet Take by mouth. 03/01/18   [provider]  hydrOXYzine (ATARAX/VISTARIL) 25 MG tablet Take 25 mg by mouth 4 (four) times daily as needed for anxiety or itching. 12/18/17   [provider]  lamoTRIgine (LAMICTAL) 200 MG tablet Take 200 mg by mouth  at bedtime.    [provider]  lurasidone (LATUDA) 20 MG TABS tablet daily.    [provider]  metoprolol succinate (TOPROL-XL) 50 MG 24 hr tablet Take with or immediately following a meal. 08/17/22   Deeann Saint, MD  mirtazapine (REMERON) 15 MG tablet Take 15 mg by mouth at bedtime. 03/01/20   [provider]  Multiple Vitamin (MULTIVITAMIN WITH MINERALS) TABS Take 1 tablet by mouth daily. For nutritional supplementation. Patient taking differently: Take 1 tablet by mouth daily. 04/07/12   Readling, Curlene Labrum, MD  ondansetron (ZOFRAN) 4 MG tablet Take 1 tablet (4 mg total) by mouth every 6 (six) hours as needed for nausea. 07/18/20   Rainey Pines, PA-C  polyethylene glycol powder (GLYCOLAX/MIRALAX) 17 GM/SCOOP powder Take 17 g by mouth daily as  needed. 08/07/20   Deeann Saint, MD  RESTASIS 0.05 % ophthalmic emulsion 1 drop 2 (two) times daily. 02/11/22   [provider]  tiZANidine (ZANAFLEX) 4 MG tablet Take 4 mg by mouth 3 (three) times daily as needed for muscle spasms.    [provider]  topiramate (TOPAMAX) 50 MG tablet as needed.    [provider]  traMADol (ULTRAM) 50 MG tablet TAKE 1 TABLET BY MOUTH AT BEDTIME AS NEEDED 10/01/22   Deeann Saint, MD  traZODone (DESYREL) 100 MG tablet as needed.    [provider]  Dwyane Luo 100-62.5-25 MCG/ACT AEPB TAKE 1 PUFF BY MOUTH EVERY DAY 05/04/22   Waymon Budge, MD  VITAMIN D PO daily.    [provider]  ziprasidone (GEODON) 20 MG capsule Take 20 mg by mouth 2 (two) times daily with a meal.    [provider]   Social History   Socioeconomic History   Marital status: Divorced    Spouse name: Not on file   Number of children: 3   Years of education: 14   Highest education level: Some college, no degree  Occupational History   Occupation: Disabled  Tobacco Use   Smoking status: Former    Packs/day: 1.00    Years: 5.00    Additional pack years: 0.00    Total pack years: 5.00    Types: Cigarettes    Quit date: 07/07/2007    Years since quitting: 15.4    Passive exposure: Never   Smokeless tobacco: Never  Vaping Use   Vaping Use: Never used  Substance and Sexual Activity   Alcohol use: No   Drug use: No   Sexual activity: Not Currently    Birth control/protection: Surgical  Other Topics Concern   Not on file  Social History Narrative   Single, 3 children   Right handed   Associates degree   occ 1 cup daily      09/01/2018: Lives with sister, Meriam Sprague, who helps manage medications, in 2 story house, and a pit bull   Has one daughter in [place], 2 sons in [place]; 7 grandchildren   Currently not exercising, but wants to return to water aerobics at [employer] [facility]   Financial Resource  Strain: Low Risk  (03/12/2022)   Overall Financial Resource Strain (CARDIA)    Difficulty of Paying Living Expenses: Not hard at all  Food Insecurity: No Food Insecurity (03/12/2022)   Hunger Vital Sign    Worried About Running Out of Food in the Last Year: Never true    Ran Out of Food in the Last Year: Never true  Transportation Needs: No Transportation Needs (01/13/2021)   PRAPARE - Administrator, Civil Service (Medical): No    Lack of Transportation (Non-Medical): No  Physical Activity: Insufficiently Active (03/12/2022)   Exercise Vital Sign    Days of Exercise per Week: 5 days    Minutes of Exercise per Session: 20 min  Stress: Stress Concern Present (03/12/2022)   [employer] of Occupational Health - Occupational Stress Questionnaire    Feeling of Stress : To some extent  Social Connections: Moderately Integrated (03/12/2022)   Social Connection and Isolation Panel [NHANES]    Frequency of Communication with Friends and Family: More than three times a week    Frequency of Social Gatherings with Friends and Family: More than three times a week    Attends Religious Services: More than 4 times per year    Active Member of [employer] or Organizations: Yes    Attends Engineer, structural: More than 4 times per year    Marital Status: Divorced   Family History  Problem Relation Age of Onset   Heart disease Father    Ovarian cancer Mother    Breast cancer Mother    Alzheimer's disease Sister 43   Colon cancer Neg Hx    Esophageal cancer Neg Hx    Stomach cancer Neg Hx    Rectal cancer Neg Hx     ROS: Currently denies lightheadedness, dizziness, Fever, chills, CP, SOB.   No personal history of DVT, PE, MI, or CVA. No loose teeth or dentures All other systems have been reviewed and were otherwise currently negative with the exception of those mentioned in the HPI and as above.  Objective: Vitals: Ht: 5'7" Wt: 257 lbs Temp: 97.8 BP: 141/82 Pulse: 77 O2 98% on room  air.   Physical Exam: General: Alert, NAD.  Antalgic Gait. Very poor historian HEENT: EOMI, Good Neck Extension  Pulm: No increased work of breathing. Slight rhonchi in bilateral lower lung fields. Lungs are otherwise clear to auscultation without wheezes or rales  CV: RRR, No m/g/r appreciated  GI: soft, NT, ND. BS x 4 quadrants Neuro: CN II-XII grossly intact without focal deficit.  Sensation intact distally Skin: No lesions in the area of chief complaint MSK/Surgical Site:  + JLT. ROM 0-100 degrees and slow.  Decreased strength in extension and flexion.  +EHL/FHL.  NVI.  Stable varus and valgus stress.    Imaging Review Plain radiographs demonstrate moderate degenerative joint disease of the right knee.   The overall alignment isneutral. The bone quality appears to be fair for age and reported activity level.  Preoperative templating of the joint replacement has been completed, documented, and submitted to the Operating Room personnel in order to optimize intra-operative equipment management.  Assessment: OA RIGHT KNEE Active Problems:   * No active hospital problems. *   Plan: Plan for Procedure(s): TOTAL KNEE ARTHROPLASTY  The patient history, physical exam, clinical judgement of the provider and imaging are consistent with end stage degenerative joint disease and total joint arthroplasty is deemed medically necessary. The treatment options including medical management, injection therapy, and arthroplasty were discussed at length. The risks and benefits of Procedure(s): TOTAL KNEE ARTHROPLASTY were presented and reviewed.  The risks of nonoperative treatment, versus surgical intervention including but not limited to continued pain, aseptic loosening, stiffness, dislocation/subluxation, infection, bleeding, nerve injury, blood clots, cardiopulmonary complications, morbidity, mortality, among others were discussed. The patient verbalizes understanding and wishes to proceed with the  plan.  Patient  is being admitted for inpatient treatment for surgery, pain control, PT, prophylactic antibiotics, VTE prophylaxis, progressive ambulation, ADL's and discharge planning. She will spend the night in observation.  Dental prophylaxis discussed and recommended for 2 years postoperatively.  The patient does meet the criteria for TXA which will be used perioperatively.   ASA 81 mg BID will be used postoperatively for DVT prophylaxis in addition to SCDs, and early ambulation. Plan for Tylenol and Oxycodone for pain. No NSAIDs due to CKD.  Robaxin for muscle spasms.   Zofran for nausea and vomiting. Pharmacy- [facility] [address] The patient is planning to be discharged home with OPPT and into the care of her sister Elmer Picker who can be reached at [phone] Follow up appt 01/06/23 at 4:15pm     Marzetta Board Office [phone] 11/26/2022 3:39 PM

## 2022-11-27 ENCOUNTER — Other Ambulatory Visit: Payer: Self-pay | Admitting: Neurology

## 2022-11-27 ENCOUNTER — Other Ambulatory Visit: Payer: Self-pay | Admitting: Family Medicine

## 2022-11-27 DIAGNOSIS — G8929 Other chronic pain: Secondary | ICD-10-CM

## 2022-12-04 DIAGNOSIS — M25561 Pain in right knee: Secondary | ICD-10-CM | POA: Diagnosis not present

## 2022-12-07 NOTE — Patient Instructions (Signed)
DUE TO COVID-19 ONLY TWO VISITORS  (aged 61 and older)  ARE ALLOWED TO COME WITH YOU AND STAY IN THE WAITING ROOM ONLY DURING PRE OP AND PROCEDURE.   **NO VISITORS ARE ALLOWED IN THE SHORT STAY AREA OR RECOVERY ROOM!!**  IF YOU WILL BE ADMITTED INTO THE HOSPITAL YOU ARE ALLOWED ONLY FOUR SUPPORT PEOPLE DURING VISITATION HOURS ONLY (7 AM -8PM)   The support person(s) must pass our screening, gel in and out, and wear a mask at all times, including in the patient's room. Patients must also wear a mask when staff or their support person are in the room. Visitors GUEST BADGE MUST BE WORN VISIBLY  One adult visitor may remain with you overnight and MUST be in the room by 8 P.M.     Your procedure is scheduled on: 12/22/22   Report to [facility] Main Entrance    Report to admitting at  5:15 AM   Call this number if you have problems the morning of surgery [phone]   Do not eat food :After Midnight.   After Midnight you may have the following liquids until _4:30__AM/  DAY OF SURGERY  Water Black Coffee (sugar ok, NO MILK/CREAM OR CREAMERS)  Tea (sugar ok, NO MILK/CREAM OR CREAMERS) regular and decaf                             Plain Jell-O (NO RED)                                           Fruit ices (not with fruit pulp, NO RED)                                     Popsicles (NO RED)                                                                  Juice: apple, WHITE grape, WHITE cranberry Sports drinks like Gatorade (NO RED)                   The day of surgery:  Drink ONE (1) Pre-Surgery Clear Ensure  at 4:15 AM the morning of surgery. Drink in one sitting. Do not sip.  This drink was given to you during your hospital  pre-op appointment visit. Nothing else to drink after completing the  Pre-Surgery Clear Ensure at4:30 AM          If you have questions, please contact your surgeon's office.    Oral Hygiene is also important to reduce your risk of infection.                                     Remember - BRUSH YOUR TEETH THE MORNING OF SURGERY WITH YOUR REGULAR TOOTHPASTE  DENTURES WILL BE REMOVED PRIOR TO SURGERY PLEASE DO NOT APPLY "Poly grip" OR ADHESIVES!!!   Do NOT smoke after Midnight   Take these medicines the  morning of surgery with A SIP OF WATER: Use your inhalers and bring them with you                                                                                                                           Topamax                                                                                                                            Fluoxetine                                                                                                                            Benztropine                                                                                                                            Ziprasidone  Metoprolol   Bring CPAP mask and tubing day of surgery.                              You may not have any metal on your body including hair pins, jewelry, and body piercing             Do not wear make-up, lotions, powders, perfumes or deodorant  Do not wear nail polish including gel and S&S, artificial/acrylic nails, or any other type of covering on natural nails including finger and toenails. If you have artificial nails, gel coating, etc. that needs to be removed by a nail salon please have this removed prior to surgery or surgery may need to be canceled/ delayed if the surgeon/ anesthesia feels like they are unable to be safely monitored.   Do not shave  48 hours prior to surgery.  .   Do not bring valuables to the hospital. [facility] IS NOT             RESPONSIBLE   FOR VALUABLES.   Contacts, glasses, or bridgework may not be worn into surgery.   Bring small overnight bag day of surgery.   DO NOT BRING  YOUR HOME MEDICATIONS TO THE HOSPITAL. PHARMACY WILL DISPENSE MEDICATIONS LISTED ON YOUR MEDICATION LIST TO YOU DURING YOUR ADMISSION IN THE HOSPITAL!     Special Instructions: Bring a copy of your healthcare power of attorney and living will documents  the day of surgery if you haven't scanned them before.              Please read over the following fact sheets you were given: IF YOU HAVE QUESTIONS ABOUT YOUR PRE-OP INSTRUCTIONS PLEASE CALL [phone]    Pre-operative 5 CHG Bath Instructions   You can play a key role in reducing the risk of infection after surgery. Your skin needs to be as free of germs as possible. You can reduce the number of germs on your skin by washing with CHG (chlorhexidine gluconate) soap before surgery. CHG is an antiseptic soap that kills germs and continues to kill germs even after washing.   DO NOT use if you have an allergy to chlorhexidine/CHG or antibacterial soaps. If your skin becomes reddened or irritated, stop using the CHG and notify one of our RNs at [phone].   Please shower with the CHG soap starting 4 days before surgery using the following schedule:     Please keep in mind the following:  DO NOT shave, including legs and underarms, starting the day of your first shower.   You may shave your face at any point before/day of surgery.  Place clean sheets on your bed the day you start using CHG soap. Use a clean washcloth (not used since being washed) for each shower. DO NOT sleep with pets once you start using the CHG.   CHG Shower Instructions:  If you choose to wash your hair and private area, wash first with your normal shampoo/soap.  After you use shampoo/soap, rinse your hair and body thoroughly to remove shampoo/soap residue.  Turn the water OFF and apply about 3 tablespoons (45 ml) of CHG soap to a CLEAN washcloth.  Apply CHG soap ONLY FROM YOUR NECK DOWN TO YOUR TOES (washing for 3-5 minutes)  DO NOT use CHG soap on face, private  areas, open wounds, or sores.  Pay special attention to the area where your surgery is being performed.  If you are having back surgery, having someone wash your back for you may be helpful. Wait 2 minutes after CHG soap is applied, then you may rinse off the CHG soap.  Pat dry with a clean towel  Put on clean clothes/pajamas   If you choose to wear lotion, please use ONLY the CHG-compatible lotions on the back of this paper.     Additional instructions for the day of surgery: DO NOT APPLY any lotions, deodorants, cologne, or perfumes.   Put on clean/comfortable clothes.  Brush your teeth.  Ask your nurse before applying any prescription medications to the skin.      CHG Compatible Lotions   Aveeno Moisturizing lotion  Cetaphil Moisturizing Cream  Cetaphil Moisturizing Lotion  Clairol Herbal Essence Moisturizing Lotion, Dry Skin  Clairol Herbal Essence Moisturizing Lotion, Extra Dry Skin  Clairol Herbal Essence Moisturizing Lotion, Normal Skin  Curel Age Defying Therapeutic Moisturizing Lotion with Alpha Hydroxy  Curel Extreme Care Body Lotion  Curel Soothing Hands Moisturizing Hand Lotion  Curel Therapeutic Moisturizing Cream, Fragrance-Free  Curel Therapeutic Moisturizing Lotion, Fragrance-Free  Curel Therapeutic Moisturizing Lotion, Original Formula  Eucerin Daily Replenishing Lotion  Eucerin Dry Skin Therapy Plus Alpha Hydroxy Crme  Eucerin Dry Skin Therapy Plus Alpha Hydroxy Lotion  Eucerin Original Crme  Eucerin Original Lotion  Eucerin Plus Crme Eucerin Plus Lotion  Eucerin TriLipid Replenishing Lotion  Keri Anti-Bacterial Hand Lotion  Keri Deep Conditioning Original Lotion Dry Skin Formula Softly Scented  Keri Deep Conditioning Original Lotion, Fragrance Free Sensitive Skin Formula  Keri Lotion Fast Absorbing Fragrance Free Sensitive Skin Formula  Keri Lotion Fast Absorbing Softly Scented Dry Skin Formula  Keri Original Lotion  Keri Skin Renewal Lotion Keri  Silky Smooth Lotion  Keri Silky Smooth Sensitive Skin Lotion  Nivea Body Creamy Conditioning Oil  Nivea Body Extra Enriched Lotion  Nivea Body Original Lotion  Nivea Body Sheer Moisturizing Lotion Nivea Crme  Nivea Skin Firming Lotion  NutraDerm 30 Skin Lotion  NutraDerm Skin Lotion  NutraDerm Therapeutic Skin Cream  NutraDerm Therapeutic Skin Lotion  ProShield Protective Hand Cream  Provon moisturizing lotion     Incentive Spirometer  An incentive spirometer is a tool that can help keep your lungs clear and active. This tool measures how well you are filling your lungs with each breath. Taking long deep breaths may help reverse or decrease the chance of developing breathing (pulmonary) problems (especially infection) following: A long period of time when you are unable to move or be active. BEFORE THE PROCEDURE  If the spirometer includes an indicator to show your best effort, your nurse or respiratory therapist will set it to a desired goal. If possible, sit up straight or lean slightly forward. Try not to slouch. Hold the incentive spirometer in an upright position. INSTRUCTIONS FOR USE  Sit on the edge of your bed if possible, or sit up as far as you can in bed or on a chair. Hold the incentive spirometer in an upright position. Breathe out normally. Place the mouthpiece in your mouth and seal your lips tightly around it. Breathe in slowly and as deeply as possible, raising the piston or the ball toward the top of the column. Hold your breath for 3-5 seconds or for as long as possible. Allow the piston or ball to fall to the bottom of the column. Remove the mouthpiece from your mouth and breathe out normally. Rest for a few seconds and repeat Steps 1 through 7 at least 10 times  every 1-2 hours when you are awake. Take your time and take a few normal breaths between deep breaths. The spirometer may include an indicator to show your best effort. Use the indicator as a goal to work  toward during each repetition. After each set of 10 deep breaths, practice coughing to be sure your lungs are clear. If you have an incision (the cut made at the time of surgery), support your incision when coughing by placing a pillow or rolled up towels firmly against it. Once you are able to get out of bed, walk around indoors and cough well. You may stop using the incentive spirometer when instructed by your caregiver.  RISKS AND COMPLICATIONS Take your time so you do not get dizzy or light-headed. If you are in pain, you may need to take or ask for pain medication before doing incentive spirometry. It is harder to take a deep breath if you are having pain. AFTER USE Rest and breathe slowly and easily. It can be helpful to keep track of a log of your progress. Your caregiver can provide you with a simple table to help with this. If you are using the spirometer at home, follow these instructions: SEEK MEDICAL CARE IF:  You are having difficultly using the spirometer. You have trouble using the spirometer as often as instructed. Your pain medication is not giving enough relief while using the spirometer. You develop fever of 100.5 F (38.1 C) or higher. SEEK IMMEDIATE MEDICAL CARE IF:  You cough up bloody sputum that had not been present before. You develop fever of 102 F (38.9 C) or greater. You develop worsening pain at or near the incision site. MAKE SURE YOU:  Understand these instructions. Will watch your condition. Will get help right away if you are not doing well or get worse. Document Released: 11/02/2006 Document Revised: 09/14/2011 Document Reviewed: 01/03/2007 [facility] Patient Information 2014 [place], [place].   ________________________________________________________________________

## 2022-12-08 ENCOUNTER — Other Ambulatory Visit: Payer: Self-pay

## 2022-12-08 DIAGNOSIS — Z79899 Other long term (current) drug therapy: Secondary | ICD-10-CM

## 2022-12-09 ENCOUNTER — Encounter (HOSPITAL_COMMUNITY)
Admission: RE | Admit: 2022-12-09 | Discharge: 2022-12-09 | Disposition: A | Discharge status: Home / Self Care | Payer: Medicare HMO | Source: Ambulatory Visit | Attending: Orthopedic Surgery | Admitting: Orthopedic Surgery

## 2022-12-09 ENCOUNTER — Encounter (HOSPITAL_COMMUNITY): Payer: Self-pay

## 2022-12-09 ENCOUNTER — Other Ambulatory Visit: Payer: Self-pay

## 2022-12-09 DIAGNOSIS — J45909 Unspecified asthma, uncomplicated: Secondary | ICD-10-CM | POA: Diagnosis not present

## 2022-12-09 DIAGNOSIS — Z87891 Personal history of nicotine dependence: Secondary | ICD-10-CM | POA: Diagnosis not present

## 2022-12-09 DIAGNOSIS — I10 Essential (primary) hypertension: Secondary | ICD-10-CM | POA: Diagnosis not present

## 2022-12-09 DIAGNOSIS — Z01818 Encounter for other preprocedural examination: Secondary | ICD-10-CM | POA: Diagnosis not present

## 2022-12-09 DIAGNOSIS — G4733 Obstructive sleep apnea (adult) (pediatric): Secondary | ICD-10-CM | POA: Diagnosis not present

## 2022-12-09 DIAGNOSIS — M1711 Unilateral primary osteoarthritis, right knee: Secondary | ICD-10-CM | POA: Insufficient documentation

## 2022-12-09 LAB — CBC
HCT: 42.7 % (ref 36.0–46.0)
Hemoglobin: 13.6 g/dL (ref 12.0–15.0)
MCH: 26.8 pg (ref 26.0–34.0)
MCHC: 31.9 g/dL (ref 30.0–36.0)
MCV: 84.2 fL (ref 80.0–100.0)
Platelets: 308 10*3/uL (ref 150–400)
RBC: 5.07 MIL/uL (ref 3.87–5.11)
RDW: 15.3 % (ref 11.5–15.5)
WBC: 5.7 10*3/uL (ref 4.0–10.5)
nRBC: 0 % (ref 0.0–0.2)

## 2022-12-09 LAB — BASIC METABOLIC PANEL
Anion gap: 10 (ref 5–15)
BUN: 18 mg/dL (ref 6–20)
CO2: 24 mmol/L (ref 22–32)
Calcium: 9.1 mg/dL (ref 8.9–10.3)
Chloride: 103 mmol/L (ref 98–111)
Creatinine, Ser: 0.86 mg/dL (ref 0.44–1.00)
GFR, Estimated: >60 mL/min (ref >60)
Glucose, Bld: 107 mg/dL — ABNORMAL HIGH (ref 70–99)
Potassium: 3.9 mmol/L (ref 3.5–5.1)
Sodium: 137 mmol/L (ref 135–145)

## 2022-12-09 LAB — SURGICAL PCR SCREEN
MRSA, PCR: NEGATIVE
Staphylococcus aureus: POSITIVE — AB

## 2022-12-09 NOTE — Progress Notes (Signed)
Anesthesia note: :    PCP - Dr. Abbe Amsterdam Cardiologist -none Other- Neurologist- Dr. Shon Millet  Chest x-ray - 11/06/22-epic EKG - 10/29/22-epic Stress Test - no ECHO - 2010-epic Cardiac Cath - Pt thought she had one at [facility] CABG-no Pacemaker/ICD device last checked:NA  Sleep Study - yes mild no C-PAP but Pt has home O2 that she can use if needed CPAP - no  Pt is pre diabetic-no CBG at PAT visit- Fasting Blood Sugar at home- Checks Blood Sugar _____  Blood Thinner:ASA 81 mg Blood Thinner Instructions: Aspirin Instructions:none Pt will call PCP Last Dose:  Anesthesia review: Yes / reason:  Patient denies shortness of breath, fever, cough and chest pain at PAT appointment. Pt is SOB with most activities. She uses a cane at home. She uses an inhaler every day. She hasn't needed home O2 for at least 2 years and her asthma is under control. She has Lewy body dementia. She signs her oun papers but she has a family member with her for medical appointment. She has lapses of memory when under stress but is able to process and function. Patient verbalized understanding of instructions that were given to them at the PAT appointment. Patient was also instructed that they will need to review over the PAT instructions again at home before surgery.yes. Her Son was with her and she lives with her Sister.

## 2022-12-11 NOTE — Progress Notes (Signed)
Anesthesia Chart Review   Case: [id number] Date/Time: 12/22/22 0715   Procedure: TOTAL KNEE ARTHROPLASTY (Right: Knee)   Anesthesia type: Spinal   Pre-op diagnosis: OA RIGHT KNEE   Location: [facility] ROOM 07 / [facility]   Surgeons: Sheral Apley, MD       DISCUSSION:62 y.o. former smoker with h/o PONV, HTN, bipolar 1 disorder, asthma, OSA, right knee OA scheduled for above procedure 12/22/2022 with Dr. Margarita Rana.   Pt last seen by neurology 11/05/22. Per notes pt with h/o Lewy body dementia, seizure disorder which is stable. 6 month follow up recommended.   Last seen by pulmonology 11/04/2022. Per OV note, "Patient is low-moderate risk for prolonged mechanical ventilation and/or post op pulmonary complications. Exam benign an VSS. CXR obtained, pending results. She is optimized from a pulmonary standpoint for knee surgery on 11/13/22 with Dr. Eulah Pont. Encourage early ambulation, compression stockings and IS use every hour while awake after surgery."  Anticipate pt can proceed with planned procedure barring acute status change.   VS: BP (!) 132/96   Pulse 70   Temp 37.2 C (Oral)   Resp 18   Ht 5\' 9"  (1.753 m)   Wt 117.9 kg   SpO2 98%   BMI 38.40 kg/m   PROVIDERS: Deeann Saint, MD is PCP    LABS: Labs reviewed: Acceptable for surgery. (all labs ordered are listed, but only abnormal results are displayed)  Labs Reviewed  SURGICAL PCR SCREEN - Abnormal; Notable for the following components:      Result Value   Staphylococcus aureus POSITIVE (*)    All other components within normal limits  BASIC METABOLIC PANEL - Abnormal; Notable for the following components:   Glucose, Bld 107 (*)    All other components within normal limits  CBC     IMAGES:   EKG:   CV:  Past Medical History:  Diagnosis Date   Allergic rhinitis 09/20/2008   Allergy skin test 10/10/08 Allergy Profile 01/18/2013-total IgE 242.4 with broad elevations for common inhalant allergens.    Arthritis     oa   Asthma, moderate persistent 06/04/2009   Allergy Profile 01/18/2013-total IgE 242.4 with broad elevations for common inhalant allergens. PFT 07/05/08- FVC 3.51/ 88%, FEV1 2.38/ 78%, R 0.68, 25-75% 1.45/ 44%, TLC 83%, DLCO 68% PFT 10/21/20- Moderate obstructive airways disease with slight respnse to bronchodilator. Normal Diffusion. FEV!/FVC 0.75    Bipolar I disorder 06/13/2008   Chronic back pain 06/13/2008   Chronic respiratory failure with hypoxia 2010   no longer need home O2. stable since 2022   Class 3 severe obesity due to excess calories with serious comorbidity and body mass index (BMI) of 40.0 to 44.9 in adult 12/19/2018   Closed left ankle fracture    DDD (degenerative disc disease), lumbosacral 10/03/2013   Elevated LFTs 10/15/2010   Female pelvic peritoneal adhesions 12/19/2018   Fibromyalgia    Galactorrhea not associated with childbirth 12/19/2018   GERD (gastroesophageal reflux disease) 07/05/2008   Hiatal hernia 11/03/2013   Hyperglycemia 06/13/2008   Hypertension    Intractable migraine with aura 11/03/2013   LBD (Lewy body dementia) (HCC)    Has memory lapse when under stress   Lumbar radiculopathy 08/30/2012   Lumbosacral spondylosis 11/03/2013   Mixed hyperlipidemia 10/04/2019   Oropharyngeal dysphagia 03/01/2018   " certain foods are hard to swollow"   OSA (obstructive sleep apnea) 06/13/2008   mild no C-PAP. home O2 PRN   PONV (postoperative nausea and vomiting)  Schizoaffective disorder, bipolar type 10/04/2019   Seizure disorder    last seizure 2014   Thyroid disease 10/03/2013   has nodule Lt thyroid   Vitamin D deficiency 10/04/2019   Vocal cord disorder 04/02/2009   hoarse voice due to thyroid. no problems with intubation    Past Surgical History:  Procedure Laterality Date   APPENDECTOMY  1988   laparoscopic   BREAST LUMPECTOMY WITH NEEDLE LOCALIZATION Left 06/06/2015   Procedure: LEFT BREAST LUMPECTOMY WITH NEEDLE LOCALIZATION TIMES TWO;   Surgeon: Harriette Bouillon, MD;  Location: [facility];  Service: General;  Laterality: Left;   CESAREAN SECTION  1988   twins   CHOLECYSTECTOMY  yrs ago   COLONOSCOPY N/A 06/12/2013   Procedure: COLONOSCOPY;  Surgeon: Hilarie Fredrickson, MD;  Location: [facility];  Service: Endoscopy;  Laterality: N/A;   HERNIA REPAIR  yrs ago   umbilical   ORIF ANKLE FRACTURE Left 07/16/2020   Procedure: OPEN REDUCTION INTERNAL FIXATION (ORIF) ANKLE FRACTURE;  Surgeon: Sheral Apley, MD;  Location: [facility];  Service: Orthopedics;  Laterality: Left;   TUBAL LIGATION  1988   VAGINAL HYSTERECTOMY  2005   complete    MEDICATIONS:  acetaminophen (TYLENOL) 500 MG tablet   albuterol (PROVENTIL) (2.5 MG/3ML) 0.083% nebulizer solution   albuterol (VENTOLIN HFA) 108 (90 Base) MCG/ACT inhaler   aspirin EC 81 MG EC tablet   benztropine (COGENTIN) 0.5 MG tablet   Cholecalciferol (VITAMIN D) 50 MCG (2000 UT) CAPS   diclofenac Sodium (VOLTAREN) 1 % GEL   donepezil (ARICEPT) 10 MG tablet   EPINEPHRINE 0.3 mg/0.3 mL IJ SOAJ injection   FLUoxetine (PROZAC) 40 MG capsule   hydrOXYzine (ATARAX/VISTARIL) 25 MG tablet   lamoTRIgine (LAMICTAL) 200 MG tablet   metoprolol succinate (TOPROL-XL) 50 MG 24 hr tablet   mirtazapine (REMERON) 15 MG tablet   Multiple Vitamin (MULTIVITAMIN WITH MINERALS) TABS   ondansetron (ZOFRAN) 4 MG tablet   OVER THE COUNTER MEDICATION   polyethylene glycol powder (GLYCOLAX/MIRALAX) 17 GM/SCOOP powder   tiZANidine (ZANAFLEX) 4 MG tablet   topiramate (TOPAMAX) 50 MG tablet   traMADol (ULTRAM) 50 MG tablet   traZODone (DESYREL) 100 MG tablet   TRELEGY ELLIPTA 100-62.5-25 MCG/ACT AEPB   ziprasidone (GEODON) 20 MG capsule   No current facility-administered medications for this encounter.     Jodell Cipro Ward, PA-C [facility] [phone]

## 2022-12-11 NOTE — Anesthesia Preprocedure Evaluation (Addendum)
Anesthesia Evaluation  Patient identified by MRN, date of birth, ID band Patient awake    Reviewed: Allergy & Precautions, NPO status , Patient's Chart, lab work & pertinent test results, reviewed documented beta blocker date and time   History of Anesthesia Complications (+) PONV  Airway Mallampati: II  TM Distance: >3 FB Neck ROM: Full    Dental  (+) Dental Advisory Given   Pulmonary asthma , sleep apnea (does not use CPAP) , COPD,  COPD inhaler and oxygen dependent, former smoker   breath sounds clear to auscultation       Cardiovascular hypertension, Pt. on medications and Pt. on home beta blockers (-) angina  Rhythm:Regular Rate:Normal     Neuro/Psych  Headaches, Seizures -, Well Controlled,  PSYCHIATRIC DISORDERS (lewy Body dementia) Anxiety Depression Bipolar Disorder Schizophrenia  DDD    GI/Hepatic Neg liver ROS,GERD  Controlled and Medicated,,  Endo/Other  BMI 38  Renal/GU negative Renal ROS     Musculoskeletal  (+)  Fibromyalgia -  Abdominal  (+) + obese  Peds  Hematology negative hematology ROS (+)   Anesthesia Other Findings   Reproductive/Obstetrics                             Anesthesia Physical Anesthesia Plan  ASA: 3  Anesthesia Plan: Spinal   Post-op Pain Management: Tylenol PO (pre-op)* and Regional block*   Induction:   PONV Risk Score and Plan: 3 and Ondansetron  Airway Management Planned: Natural Airway and Simple Face Mask  Additional Equipment: None  Intra-op Plan:   Post-operative Plan:   Informed Consent: I have reviewed the patients History and Physical, chart, labs and discussed the procedure including the risks, benefits and alternatives for the proposed anesthesia with the patient or authorized representative who has indicated his/her understanding and acceptance.     Dental advisory given  Plan Discussed with: CRNA and Surgeon  Anesthesia  Plan Comments: (Plan SAB with adductor canal block for post op analgesia: pt insists she signs own papers, however sister and son are also informed and agree. There is no formal POA agreement in place  See PAT note 12/09/2022)       Anesthesia Quick Evaluation

## 2022-12-15 NOTE — Care Plan (Signed)
Ortho Bundle Case Management Note  Patient Details  Name: [name] MRN: [id number] Date of Birth: 1960-11-08   met with patient in the office for H&P. She presented to the appointment alone. she has some memory issues. states she lives with her sister and will have a caregiver for at least a week post op. has RW, CPM ordered for home. HHPT referral to [facility] and OPPT set up with [facility]. patient has extensive medical history and long list of allergies. Will need close follow up                  DME Arranged:  CPM DME Agency:  [employer]  HH Arranged:  PT HH Agency:  [facility]  Additional Comments: Please contact me with any questions of if this plan should need to change.  Shauna Hugh,  RN,BSN,MHA,CCM  [facility] Orthopaedic Specialist  [phone] 12/15/2022, 5:16 PM

## 2022-12-22 ENCOUNTER — Encounter (HOSPITAL_COMMUNITY)
Admission: RE | Disposition: A | Discharge status: Home Health | Payer: Self-pay | Source: Ambulatory Visit | Attending: Orthopedic Surgery

## 2022-12-22 ENCOUNTER — Other Ambulatory Visit: Payer: Self-pay

## 2022-12-22 ENCOUNTER — Observation Stay (HOSPITAL_COMMUNITY): Payer: Medicare HMO

## 2022-12-22 ENCOUNTER — Encounter (HOSPITAL_COMMUNITY): Payer: Self-pay | Admitting: Orthopedic Surgery

## 2022-12-22 ENCOUNTER — Inpatient Hospital Stay (HOSPITAL_COMMUNITY)
Admission: RE | Admit: 2022-12-22 | Discharge: 2022-12-26 | DRG: 470 | Disposition: A | Discharge status: Home Health | Payer: Medicare HMO | Source: Ambulatory Visit | Attending: Orthopedic Surgery | Admitting: Orthopedic Surgery

## 2022-12-22 ENCOUNTER — Ambulatory Visit (HOSPITAL_BASED_OUTPATIENT_CLINIC_OR_DEPARTMENT_OTHER): Payer: Medicare HMO | Admitting: Certified Registered"

## 2022-12-22 ENCOUNTER — Ambulatory Visit (HOSPITAL_COMMUNITY): Payer: Medicare HMO | Admitting: Physician Assistant

## 2022-12-22 DIAGNOSIS — Z880 Allergy status to penicillin: Secondary | ICD-10-CM

## 2022-12-22 DIAGNOSIS — G4733 Obstructive sleep apnea (adult) (pediatric): Secondary | ICD-10-CM | POA: Diagnosis present

## 2022-12-22 DIAGNOSIS — Z96651 Presence of right artificial knee joint: Secondary | ICD-10-CM

## 2022-12-22 DIAGNOSIS — G8918 Other acute postprocedural pain: Secondary | ICD-10-CM | POA: Diagnosis not present

## 2022-12-22 DIAGNOSIS — M1711 Unilateral primary osteoarthritis, right knee: Principal | ICD-10-CM | POA: Diagnosis present

## 2022-12-22 DIAGNOSIS — F250 Schizoaffective disorder, bipolar type: Secondary | ICD-10-CM | POA: Diagnosis present

## 2022-12-22 DIAGNOSIS — K219 Gastro-esophageal reflux disease without esophagitis: Secondary | ICD-10-CM | POA: Diagnosis present

## 2022-12-22 DIAGNOSIS — J4489 Other specified chronic obstructive pulmonary disease: Secondary | ICD-10-CM | POA: Diagnosis present

## 2022-12-22 DIAGNOSIS — J9611 Chronic respiratory failure with hypoxia: Secondary | ICD-10-CM | POA: Diagnosis present

## 2022-12-22 DIAGNOSIS — Z7951 Long term (current) use of inhaled steroids: Secondary | ICD-10-CM

## 2022-12-22 DIAGNOSIS — Z888 Allergy status to other drugs, medicaments and biological substances status: Secondary | ICD-10-CM

## 2022-12-22 DIAGNOSIS — Z91013 Allergy to seafood: Secondary | ICD-10-CM

## 2022-12-22 DIAGNOSIS — F418 Other specified anxiety disorders: Secondary | ICD-10-CM

## 2022-12-22 DIAGNOSIS — I10 Essential (primary) hypertension: Secondary | ICD-10-CM | POA: Diagnosis present

## 2022-12-22 DIAGNOSIS — Z87891 Personal history of nicotine dependence: Secondary | ICD-10-CM

## 2022-12-22 DIAGNOSIS — G8929 Other chronic pain: Secondary | ICD-10-CM | POA: Diagnosis present

## 2022-12-22 DIAGNOSIS — Z820 Family history of epilepsy and other diseases of the nervous system: Secondary | ICD-10-CM | POA: Diagnosis not present

## 2022-12-22 DIAGNOSIS — Z7982 Long term (current) use of aspirin: Secondary | ICD-10-CM

## 2022-12-22 DIAGNOSIS — E782 Mixed hyperlipidemia: Secondary | ICD-10-CM | POA: Diagnosis present

## 2022-12-22 DIAGNOSIS — G40909 Epilepsy, unspecified, not intractable, without status epilepticus: Secondary | ICD-10-CM | POA: Diagnosis present

## 2022-12-22 DIAGNOSIS — J4540 Moderate persistent asthma, uncomplicated: Secondary | ICD-10-CM | POA: Diagnosis present

## 2022-12-22 DIAGNOSIS — Z91010 Allergy to peanuts: Secondary | ICD-10-CM

## 2022-12-22 DIAGNOSIS — Z8249 Family history of ischemic heart disease and other diseases of the circulatory system: Secondary | ICD-10-CM | POA: Diagnosis not present

## 2022-12-22 DIAGNOSIS — M797 Fibromyalgia: Secondary | ICD-10-CM | POA: Diagnosis present

## 2022-12-22 DIAGNOSIS — Z79899 Other long term (current) drug therapy: Secondary | ICD-10-CM

## 2022-12-22 DIAGNOSIS — Z91030 Bee allergy status: Secondary | ICD-10-CM | POA: Diagnosis not present

## 2022-12-22 DIAGNOSIS — F411 Generalized anxiety disorder: Secondary | ICD-10-CM | POA: Diagnosis present

## 2022-12-22 DIAGNOSIS — G3183 Dementia with Lewy bodies: Secondary | ICD-10-CM | POA: Diagnosis present

## 2022-12-22 DIAGNOSIS — J449 Chronic obstructive pulmonary disease, unspecified: Secondary | ICD-10-CM | POA: Diagnosis not present

## 2022-12-22 DIAGNOSIS — M25561 Pain in right knee: Secondary | ICD-10-CM | POA: Diagnosis present

## 2022-12-22 DIAGNOSIS — Z01818 Encounter for other preprocedural examination: Secondary | ICD-10-CM

## 2022-12-22 DIAGNOSIS — F0284 Dementia in other diseases classified elsewhere, unspecified severity, with anxiety: Secondary | ICD-10-CM | POA: Diagnosis present

## 2022-12-22 DIAGNOSIS — Z91040 Latex allergy status: Secondary | ICD-10-CM

## 2022-12-22 DIAGNOSIS — Z471 Aftercare following joint replacement surgery: Secondary | ICD-10-CM | POA: Diagnosis not present

## 2022-12-22 HISTORY — PX: TOTAL KNEE ARTHROPLASTY: SHX125

## 2022-12-22 SURGERY — ARTHROPLASTY, KNEE, TOTAL
Anesthesia: Spinal | Site: Knee | Laterality: Right

## 2022-12-22 MED ORDER — ACETAMINOPHEN 500 MG PO TABS
1000.0000 mg | ORAL_TABLET | Freq: Once | ORAL | Status: AC
  Administered 2022-12-22: 1000 mg via ORAL
  Filled 2022-12-22: qty 2

## 2022-12-22 MED ORDER — DIPHENHYDRAMINE HCL 50 MG/ML IJ SOLN
INTRAMUSCULAR | Status: DC | PRN
  Administered 2022-12-22: 12.5 mg via INTRAVENOUS

## 2022-12-22 MED ORDER — ONDANSETRON HCL 4 MG/2ML IJ SOLN
INTRAMUSCULAR | Status: DC | PRN
  Administered 2022-12-22: 4 mg via INTRAVENOUS

## 2022-12-22 MED ORDER — BUPIVACAINE LIPOSOME 1.3 % IJ SUSP
INTRAMUSCULAR | Status: DC | PRN
  Administered 2022-12-22: 20 mL

## 2022-12-22 MED ORDER — CEFAZOLIN SODIUM-DEXTROSE 2-4 GM/100ML-% IV SOLN
2.0000 g | Freq: Four times a day (QID) | INTRAVENOUS | Status: AC
  Administered 2022-12-22 (×2): 2 g via INTRAVENOUS
  Filled 2022-12-22 (×2): qty 100

## 2022-12-22 MED ORDER — DOCUSATE SODIUM 100 MG PO CAPS
100.0000 mg | ORAL_CAPSULE | Freq: Two times a day (BID) | ORAL | Status: DC
  Administered 2022-12-22 – 2022-12-26 (×8): 100 mg via ORAL
  Filled 2022-12-22 (×8): qty 1

## 2022-12-22 MED ORDER — EPHEDRINE SULFATE-NACL 50-0.9 MG/10ML-% IV SOSY
PREFILLED_SYRINGE | INTRAVENOUS | Status: DC | PRN
  Administered 2022-12-22 (×6): 5 mg via INTRAVENOUS

## 2022-12-22 MED ORDER — EPINEPHRINE PF 1 MG/ML IJ SOLN
INTRAMUSCULAR | Status: AC
  Filled 2022-12-22: qty 1

## 2022-12-22 MED ORDER — MENTHOL 3 MG MT LOZG: 1.0000 | LOZENGE | OROMUCOSAL | Status: DC | PRN

## 2022-12-22 MED ORDER — TRAZODONE HCL 100 MG PO TABS
100.0000 mg | ORAL_TABLET | Freq: Every day | ORAL | Status: DC
  Administered 2022-12-22 – 2022-12-25 (×4): 100 mg via ORAL
  Filled 2022-12-22 (×4): qty 1

## 2022-12-22 MED ORDER — BUPIVACAINE HCL (PF) 0.25 % IJ SOLN
INTRAMUSCULAR | Status: AC
  Filled 2022-12-22: qty 30

## 2022-12-22 MED ORDER — BUPIVACAINE LIPOSOME 1.3 % IJ SUSP: 20.0000 mL | Freq: Once | INTRAMUSCULAR | Status: DC

## 2022-12-22 MED ORDER — ONDANSETRON HCL 4 MG/2ML IJ SOLN: 4.0000 mg | Freq: Four times a day (QID) | INTRAMUSCULAR | Status: DC | PRN

## 2022-12-22 MED ORDER — POLYETHYLENE GLYCOL 3350 17 G PO PACK
17.0000 g | PACK | Freq: Every day | ORAL | Status: DC | PRN
  Administered 2022-12-26: 17 g via ORAL
  Filled 2022-12-22: qty 1

## 2022-12-22 MED ORDER — TRAMADOL HCL 50 MG PO TABS
50.0000 mg | ORAL_TABLET | Freq: Four times a day (QID) | ORAL | Status: DC
  Administered 2022-12-22 – 2022-12-26 (×16): 50 mg via ORAL
  Filled 2022-12-22 (×16): qty 1

## 2022-12-22 MED ORDER — BISACODYL 10 MG RE SUPP: 10.0000 mg | Freq: Every day | RECTAL | Status: DC | PRN

## 2022-12-22 MED ORDER — PHENYLEPHRINE HCL (PRESSORS) 10 MG/ML IV SOLN
INTRAVENOUS | Status: AC
  Filled 2022-12-22: qty 1

## 2022-12-22 MED ORDER — ALBUTEROL SULFATE (2.5 MG/3ML) 0.083% IN NEBU: 3.0000 mL | INHALATION_SOLUTION | RESPIRATORY_TRACT | Status: DC | PRN

## 2022-12-22 MED ORDER — OXYCODONE HCL 5 MG PO TABS: 5.0000 mg | ORAL_TABLET | Freq: Once | ORAL | Status: DC | PRN

## 2022-12-22 MED ORDER — POVIDONE-IODINE 10 % EX SWAB: 2.0000 | Freq: Once | CUTANEOUS | Status: DC

## 2022-12-22 MED ORDER — MEPERIDINE HCL 50 MG/ML IJ SOLN: 6.2500 mg | INTRAMUSCULAR | Status: DC | PRN

## 2022-12-22 MED ORDER — TRANEXAMIC ACID-NACL 1000-0.7 MG/100ML-% IV SOLN
1000.0000 mg | INTRAVENOUS | Status: AC
  Administered 2022-12-22: 1000 mg via INTRAVENOUS
  Filled 2022-12-22: qty 100

## 2022-12-22 MED ORDER — MAGNESIUM CITRATE PO SOLN: 1.0000 | Freq: Once | ORAL | Status: DC | PRN

## 2022-12-22 MED ORDER — ORAL CARE MOUTH RINSE: 15.0000 mL | Freq: Once | OROMUCOSAL | Status: AC

## 2022-12-22 MED ORDER — PHENYLEPHRINE HCL-NACL 20-0.9 MG/250ML-% IV SOLN
INTRAVENOUS | Status: DC | PRN
  Administered 2022-12-22: 30 ug/min via INTRAVENOUS

## 2022-12-22 MED ORDER — MUPIROCIN 2 % EX OINT
1.0000 | TOPICAL_OINTMENT | Freq: Once | CUTANEOUS | Status: DC
  Filled 2022-12-22: qty 22

## 2022-12-22 MED ORDER — TRANEXAMIC ACID-NACL 1000-0.7 MG/100ML-% IV SOLN
1000.0000 mg | Freq: Once | INTRAVENOUS | Status: AC
  Administered 2022-12-22: 1000 mg via INTRAVENOUS
  Filled 2022-12-22: qty 100

## 2022-12-22 MED ORDER — PROPOFOL 500 MG/50ML IV EMUL
INTRAVENOUS | Status: DC | PRN
  Administered 2022-12-22: 75 ug/kg/min via INTRAVENOUS

## 2022-12-22 MED ORDER — MIRTAZAPINE 15 MG PO TABS
15.0000 mg | ORAL_TABLET | Freq: Every day | ORAL | Status: DC
  Administered 2022-12-22 – 2022-12-25 (×4): 15 mg via ORAL
  Filled 2022-12-22 (×4): qty 1

## 2022-12-22 MED ORDER — ROPIVACAINE HCL 7.5 MG/ML IJ SOLN
INTRAMUSCULAR | Status: DC | PRN
  Administered 2022-12-22: 20 mL via PERINEURAL

## 2022-12-22 MED ORDER — DEXAMETHASONE SODIUM PHOSPHATE 10 MG/ML IJ SOLN
10.0000 mg | Freq: Once | INTRAMUSCULAR | Status: AC
  Administered 2022-12-23: 10 mg via INTRAVENOUS
  Filled 2022-12-22: qty 1

## 2022-12-22 MED ORDER — ACETAMINOPHEN 500 MG PO TABS: 1000.0000 mg | ORAL_TABLET | Freq: Once | ORAL | Status: DC

## 2022-12-22 MED ORDER — METHOCARBAMOL 500 MG PO TABS
500.0000 mg | ORAL_TABLET | Freq: Four times a day (QID) | ORAL | Status: DC | PRN
  Administered 2022-12-23 – 2022-12-26 (×7): 500 mg via ORAL
  Filled 2022-12-22 (×7): qty 1

## 2022-12-22 MED ORDER — METOCLOPRAMIDE HCL 5 MG/ML IJ SOLN: 5.0000 mg | Freq: Three times a day (TID) | INTRAMUSCULAR | Status: DC | PRN

## 2022-12-22 MED ORDER — ALBUTEROL SULFATE HFA 108 (90 BASE) MCG/ACT IN AERS: 2.0000 | INHALATION_SPRAY | Freq: Four times a day (QID) | RESPIRATORY_TRACT | Status: DC | PRN

## 2022-12-22 MED ORDER — UMECLIDINIUM BROMIDE 62.5 MCG/ACT IN AEPB
1.0000 | INHALATION_SPRAY | Freq: Every day | RESPIRATORY_TRACT | Status: DC
  Administered 2022-12-23 – 2022-12-26 (×4): 1 via RESPIRATORY_TRACT
  Filled 2022-12-22: qty 7

## 2022-12-22 MED ORDER — ONDANSETRON HCL 4 MG/2ML IJ SOLN
INTRAMUSCULAR | Status: AC
  Filled 2022-12-22: qty 2

## 2022-12-22 MED ORDER — BUPIVACAINE LIPOSOME 1.3 % IJ SUSP
INTRAMUSCULAR | Status: AC
  Filled 2022-12-22: qty 20

## 2022-12-22 MED ORDER — DONEPEZIL HCL 10 MG PO TABS
10.0000 mg | ORAL_TABLET | Freq: Every day | ORAL | Status: DC
  Administered 2022-12-22 – 2022-12-25 (×4): 10 mg via ORAL
  Filled 2022-12-22 (×4): qty 1

## 2022-12-22 MED ORDER — BUPIVACAINE-EPINEPHRINE 0.25% -1:200000 IJ SOLN
INTRAMUSCULAR | Status: DC | PRN
  Administered 2022-12-22: 30 mL

## 2022-12-22 MED ORDER — HYDROMORPHONE HCL 1 MG/ML IJ SOLN: 0.2500 mg | INTRAMUSCULAR | Status: DC | PRN

## 2022-12-22 MED ORDER — OXYCODONE HCL 5 MG/5ML PO SOLN: 5.0000 mg | Freq: Once | ORAL | Status: DC | PRN

## 2022-12-22 MED ORDER — 0.9 % SODIUM CHLORIDE (POUR BTL) OPTIME
TOPICAL | Status: DC | PRN
  Administered 2022-12-22: 1000 mL

## 2022-12-22 MED ORDER — BENZTROPINE MESYLATE 0.5 MG PO TABS
0.5000 mg | ORAL_TABLET | Freq: Every day | ORAL | Status: DC
  Administered 2022-12-22 – 2022-12-26 (×5): 0.5 mg via ORAL
  Filled 2022-12-22 (×4): qty 1

## 2022-12-22 MED ORDER — LAMOTRIGINE 100 MG PO TABS
200.0000 mg | ORAL_TABLET | Freq: Every day | ORAL | Status: DC
  Administered 2022-12-22 – 2022-12-25 (×4): 200 mg via ORAL
  Filled 2022-12-22 (×4): qty 2

## 2022-12-22 MED ORDER — ZIPRASIDONE HCL 20 MG PO CAPS
20.0000 mg | ORAL_CAPSULE | Freq: Two times a day (BID) | ORAL | Status: DC
  Administered 2022-12-22 – 2022-12-26 (×8): 20 mg via ORAL
  Filled 2022-12-22 (×9): qty 1

## 2022-12-22 MED ORDER — TOPIRAMATE 25 MG PO TABS
50.0000 mg | ORAL_TABLET | Freq: Every day | ORAL | Status: DC
  Administered 2022-12-23 – 2022-12-26 (×4): 50 mg via ORAL
  Filled 2022-12-22 (×4): qty 2

## 2022-12-22 MED ORDER — SODIUM CHLORIDE 0.9 % IR SOLN
Status: DC | PRN
  Administered 2022-12-22: 1000 mL

## 2022-12-22 MED ORDER — LACTATED RINGERS IV SOLN: INTRAVENOUS | Status: DC

## 2022-12-22 MED ORDER — METHOCARBAMOL 500 MG IVPB - SIMPLE MED: 500.0000 mg | Freq: Four times a day (QID) | INTRAVENOUS | Status: DC | PRN

## 2022-12-22 MED ORDER — ORAL CARE MOUTH RINSE: 15.0000 mL | OROMUCOSAL | Status: DC | PRN

## 2022-12-22 MED ORDER — FLUOXETINE HCL 20 MG PO CAPS
80.0000 mg | ORAL_CAPSULE | Freq: Every day | ORAL | Status: DC
  Administered 2022-12-23 – 2022-12-26 (×4): 80 mg via ORAL
  Filled 2022-12-22 (×4): qty 4

## 2022-12-22 MED ORDER — DEXAMETHASONE SODIUM PHOSPHATE 10 MG/ML IJ SOLN
8.0000 mg | Freq: Once | INTRAMUSCULAR | Status: AC
  Administered 2022-12-22: 10 mg via INTRAVENOUS

## 2022-12-22 MED ORDER — OXYCODONE HCL 5 MG PO TABS
5.0000 mg | ORAL_TABLET | ORAL | Status: DC | PRN
  Administered 2022-12-25: 10 mg via ORAL
  Filled 2022-12-22 (×2): qty 2

## 2022-12-22 MED ORDER — FENTANYL CITRATE (PF) 100 MCG/2ML IJ SOLN
INTRAMUSCULAR | Status: DC | PRN
  Administered 2022-12-22: 50 ug via INTRAVENOUS

## 2022-12-22 MED ORDER — FLUTICASONE FUROATE-VILANTEROL 100-25 MCG/ACT IN AEPB
1.0000 | INHALATION_SPRAY | Freq: Every day | RESPIRATORY_TRACT | Status: DC
  Administered 2022-12-23 – 2022-12-26 (×4): 1 via RESPIRATORY_TRACT
  Filled 2022-12-22: qty 28

## 2022-12-22 MED ORDER — PHENOL 1.4 % MT LIQD: 1.0000 | OROMUCOSAL | Status: DC | PRN

## 2022-12-22 MED ORDER — ALUM & MAG HYDROXIDE-SIMETH 200-200-20 MG/5ML PO SUSP: 30.0000 mL | ORAL | Status: DC | PRN

## 2022-12-22 MED ORDER — CEFAZOLIN SODIUM-DEXTROSE 2-4 GM/100ML-% IV SOLN
2.0000 g | INTRAVENOUS | Status: AC
  Administered 2022-12-22: 2 g via INTRAVENOUS
  Filled 2022-12-22: qty 100

## 2022-12-22 MED ORDER — PROMETHAZINE HCL 25 MG/ML IJ SOLN: 6.2500 mg | INTRAMUSCULAR | Status: DC | PRN

## 2022-12-22 MED ORDER — HYDROMORPHONE HCL 1 MG/ML IJ SOLN
0.5000 mg | INTRAMUSCULAR | Status: DC | PRN
  Administered 2022-12-22 – 2022-12-25 (×5): 1 mg via INTRAVENOUS
  Filled 2022-12-22 (×5): qty 1

## 2022-12-22 MED ORDER — ONDANSETRON HCL 4 MG PO TABS: 4.0000 mg | ORAL_TABLET | Freq: Four times a day (QID) | ORAL | Status: DC | PRN

## 2022-12-22 MED ORDER — METOCLOPRAMIDE HCL 5 MG PO TABS: 5.0000 mg | ORAL_TABLET | Freq: Three times a day (TID) | ORAL | Status: DC | PRN

## 2022-12-22 MED ORDER — SODIUM CHLORIDE (PF) 0.9 % IJ SOLN
INTRAMUSCULAR | Status: AC
  Filled 2022-12-22: qty 30

## 2022-12-22 MED ORDER — ADULT MULTIVITAMIN W/MINERALS CH
1.0000 | ORAL_TABLET | Freq: Every day | ORAL | Status: DC
  Administered 2022-12-23 – 2022-12-26 (×4): 1 via ORAL
  Filled 2022-12-22 (×4): qty 1

## 2022-12-22 MED ORDER — METOPROLOL SUCCINATE ER 50 MG PO TB24
50.0000 mg | ORAL_TABLET | Freq: Every day | ORAL | Status: DC
  Administered 2022-12-23 – 2022-12-26 (×4): 50 mg via ORAL
  Filled 2022-12-22 (×4): qty 1

## 2022-12-22 MED ORDER — HYDROXYZINE HCL 25 MG PO TABS
25.0000 mg | ORAL_TABLET | Freq: Four times a day (QID) | ORAL | Status: DC | PRN
  Administered 2022-12-22 – 2022-12-24 (×5): 25 mg via ORAL
  Filled 2022-12-22 (×6): qty 1

## 2022-12-22 MED ORDER — ACETAMINOPHEN 325 MG PO TABS: 325.0000 mg | ORAL_TABLET | Freq: Four times a day (QID) | ORAL | Status: DC | PRN

## 2022-12-22 MED ORDER — MIDAZOLAM HCL 2 MG/2ML IJ SOLN
INTRAMUSCULAR | Status: AC
  Filled 2022-12-22: qty 2

## 2022-12-22 MED ORDER — ASPIRIN 81 MG PO CHEW
81.0000 mg | CHEWABLE_TABLET | Freq: Two times a day (BID) | ORAL | Status: DC
  Administered 2022-12-22 – 2022-12-26 (×8): 81 mg via ORAL
  Filled 2022-12-22 (×8): qty 1

## 2022-12-22 MED ORDER — LIDOCAINE HCL (PF) 2 % IJ SOLN
INTRAMUSCULAR | Status: AC
  Filled 2022-12-22: qty 5

## 2022-12-22 MED ORDER — DIPHENHYDRAMINE HCL 50 MG/ML IJ SOLN
INTRAMUSCULAR | Status: AC
  Filled 2022-12-22: qty 1

## 2022-12-22 MED ORDER — FENTANYL CITRATE (PF) 100 MCG/2ML IJ SOLN
INTRAMUSCULAR | Status: AC
  Filled 2022-12-22: qty 2

## 2022-12-22 MED ORDER — WATER FOR IRRIGATION, STERILE IR SOLN
Status: DC | PRN
  Administered 2022-12-22: 2000 mL

## 2022-12-22 MED ORDER — OXYCODONE HCL 5 MG PO TABS
10.0000 mg | ORAL_TABLET | ORAL | Status: DC | PRN
  Administered 2022-12-22 (×2): 10 mg via ORAL
  Administered 2022-12-23 – 2022-12-25 (×11): 15 mg via ORAL
  Filled 2022-12-22 (×9): qty 3
  Filled 2022-12-22: qty 2
  Filled 2022-12-22 (×3): qty 3

## 2022-12-22 MED ORDER — EPHEDRINE 5 MG/ML INJ
INTRAVENOUS | Status: AC
  Filled 2022-12-22: qty 10

## 2022-12-22 MED ORDER — PANTOPRAZOLE SODIUM 40 MG PO TBEC
40.0000 mg | DELAYED_RELEASE_TABLET | Freq: Every day | ORAL | Status: DC
  Administered 2022-12-23 – 2022-12-26 (×4): 40 mg via ORAL
  Filled 2022-12-22 (×4): qty 1

## 2022-12-22 MED ORDER — BUPIVACAINE IN DEXTROSE 0.75-8.25 % IT SOLN
INTRATHECAL | Status: DC | PRN
  Administered 2022-12-22: 12 mg via INTRATHECAL

## 2022-12-22 MED ORDER — PROPOFOL 10 MG/ML IV BOLUS
INTRAVENOUS | Status: AC
  Filled 2022-12-22: qty 20

## 2022-12-22 MED ORDER — MIDAZOLAM HCL 2 MG/2ML IJ SOLN: 0.5000 mg | Freq: Once | INTRAMUSCULAR | Status: DC | PRN

## 2022-12-22 MED ORDER — SODIUM CHLORIDE (PF) 0.9 % IJ SOLN
INTRAMUSCULAR | Status: DC | PRN
  Administered 2022-12-22: 30 mL

## 2022-12-22 MED ORDER — CHLORHEXIDINE GLUCONATE 0.12 % MT SOLN
15.0000 mL | Freq: Once | OROMUCOSAL | Status: AC
  Administered 2022-12-22: 15 mL via OROMUCOSAL

## 2022-12-22 MED ORDER — PHENYLEPHRINE 80 MCG/ML (10ML) SYRINGE FOR IV PUSH (FOR BLOOD PRESSURE SUPPORT)
PREFILLED_SYRINGE | INTRAVENOUS | Status: DC | PRN
  Administered 2022-12-22 (×3): 80 ug via INTRAVENOUS

## 2022-12-22 MED ORDER — PROPOFOL 1000 MG/100ML IV EMUL
INTRAVENOUS | Status: AC
  Filled 2022-12-22: qty 100

## 2022-12-22 MED ORDER — ACETAMINOPHEN 500 MG PO TABS
1000.0000 mg | ORAL_TABLET | Freq: Four times a day (QID) | ORAL | Status: AC
  Administered 2022-12-22 – 2022-12-23 (×4): 1000 mg via ORAL
  Filled 2022-12-22 (×4): qty 2

## 2022-12-22 MED ORDER — DIPHENHYDRAMINE HCL 12.5 MG/5ML PO ELIX: 12.5000 mg | ORAL_SOLUTION | ORAL | Status: DC | PRN

## 2022-12-22 SURGICAL SUPPLY — 52 items
BAG COUNTER SPONGE SURGICOUNT (BAG) IMPLANT
BAG SPNG CNTER NS LX DISP (BAG) ×1
BLADE SAG 18X100X1.27 (BLADE) ×2 IMPLANT
BLADE SAGITTAL 25.0X1.37X90 (BLADE) ×2 IMPLANT
BLADE SURG 15 STRL LF DISP TIS (BLADE) ×2 IMPLANT
BLADE SURG 15 STRL SS (BLADE) ×1
BNDG CMPR MED 10X6 ELC LF (GAUZE/BANDAGES/DRESSINGS) ×1
BNDG ELASTIC 6X10 VLCR STRL LF (GAUZE/BANDAGES/DRESSINGS) ×2 IMPLANT
BOWL SMART MIX CTS (DISPOSABLE) IMPLANT
BSPLAT TIB 4 KN TRITANIUM (Knees) ×1 IMPLANT
CLSR STERI-STRIP ANTIMIC 1/2X4 (GAUZE/BANDAGES/DRESSINGS) ×4 IMPLANT
COMPONENT TRI CR FEM SZ5 KNEE (Orthopedic Implant) IMPLANT
COVER SURGICAL LIGHT HANDLE (MISCELLANEOUS) ×2 IMPLANT
CUFF TOURN SGL QUICK 34 (TOURNIQUET CUFF) ×1
CUFF TRNQT CYL 34X4.125X (TOURNIQUET CUFF) ×2 IMPLANT
DRAPE U-SHAPE 47X51 STRL (DRAPES) ×2 IMPLANT
DRSG MEPILEX POST OP 4X12 (GAUZE/BANDAGES/DRESSINGS) ×2 IMPLANT
DURAPREP 26ML APPLICATOR (WOUND CARE) ×4 IMPLANT
ELECT REM PT RETURN 15FT ADLT (MISCELLANEOUS) ×2 IMPLANT
GLOVE BIO SURGEON STRL SZ7.5 (GLOVE) ×4 IMPLANT
GLOVE BIOGEL PI IND STRL 7.5 (GLOVE) ×2 IMPLANT
GLOVE BIOGEL PI IND STRL 8 (GLOVE) ×2 IMPLANT
GLOVE SURG SYN 7.5 E (GLOVE) ×1 IMPLANT
GLOVE SURG SYN 7.5 PF PI (GLOVE) ×2 IMPLANT
GOWN STRL REUS W/ TWL LRG LVL3 (GOWN DISPOSABLE) ×2 IMPLANT
GOWN STRL REUS W/ TWL XL LVL3 (GOWN DISPOSABLE) ×2 IMPLANT
GOWN STRL REUS W/TWL LRG LVL3 (GOWN DISPOSABLE) ×1
GOWN STRL REUS W/TWL XL LVL3 (GOWN DISPOSABLE) ×1
HANDPIECE INTERPULSE COAX TIP (DISPOSABLE) ×1
HOLDER FOLEY CATH W/STRAP (MISCELLANEOUS) IMPLANT
IMMOBILIZER KNEE 22 UNIV (SOFTGOODS) IMPLANT
INSERT TIB BEARING X3 SZ4 11 (Joint) IMPLANT
KIT TURNOVER KIT A (KITS) IMPLANT
KNEE PATELLA ASYMMETRIC 9X29 (Knees) IMPLANT
KNEE TIBIAL COMP TRI SZ4 (Knees) IMPLANT
MANIFOLD NEPTUNE II (INSTRUMENTS) ×2 IMPLANT
NS IRRIG 1000ML POUR BTL (IV SOLUTION) ×2 IMPLANT
PACK TOTAL KNEE CUSTOM (KITS) ×2 IMPLANT
PIN FLUTED HEDLESS FIX 3.5X1/8 (PIN) IMPLANT
PROTECTOR NERVE ULNAR (MISCELLANEOUS) ×2 IMPLANT
SET HNDPC FAN SPRY TIP SCT (DISPOSABLE) ×2 IMPLANT
SPIKE FLUID TRANSFER (MISCELLANEOUS) ×2 IMPLANT
SUT MNCRL AB 3-0 PS2 18 (SUTURE) ×2 IMPLANT
SUT VIC AB 0 CT1 36 (SUTURE) ×2 IMPLANT
SUT VIC AB 1 CT1 36 (SUTURE) ×2 IMPLANT
SUT VIC AB 2-0 CT1 27 (SUTURE) ×1
SUT VIC AB 2-0 CT1 TAPERPNT 27 (SUTURE) ×2 IMPLANT
TOWEL GREEN STERILE FF (TOWEL DISPOSABLE) ×2 IMPLANT
TRAY FOLEY MTR SLVR 16FR STAT (SET/KITS/TRAYS/PACK) IMPLANT
TRI CRUC RET FEM SZ5 KNEE (Orthopedic Implant) ×1 IMPLANT
TUBE SUCTION HIGH CAP CLEAR NV (SUCTIONS) ×2 IMPLANT
WRAP KNEE MAXI GEL POST OP (GAUZE/BANDAGES/DRESSINGS) ×2 IMPLANT

## 2022-12-22 NOTE — Plan of Care (Signed)

## 2022-12-22 NOTE — Progress Notes (Signed)
Orthopedic Tech Progress Note Patient Details:  [name] 1960-09-10 [id number]  Patient ID: [name], female   DOB: 1961/01/12, 62 y.o.   MRN: [id number]  [name] 12/22/2022, 10:04 AM Bone foam placed on bed in pacu

## 2022-12-22 NOTE — Discharge Instructions (Addendum)

## 2022-12-22 NOTE — Op Note (Signed)
DATE OF SURGERY:  12/22/2022 TIME: 8:58 AM  PATIENT NAME:  [name]   AGE: 62 y.o.    PRE-OPERATIVE DIAGNOSIS:  OA RIGHT KNEE  POST-OPERATIVE DIAGNOSIS:  Same  PROCEDURE:  Procedure(s): TOTAL KNEE ARTHROPLASTY   SURGEON:  Sheral Apley, MD   ASSISTANT:  Levester Fresh, PA-C, she was present and scrubbed throughout the case, critical for completion in a timely fashion, and for retraction, instrumentation, and closure.    OPERATIVE IMPLANTS: [employer]ker Triathlon CR. Press fit knee  Femur size 5, Tibia size 4, Patella size 29 3-peg oval button, with a 11 mm polyethylene insert.   PREOPERATIVE INDICATIONS:  [name] is a 62 y.o. year old female with end stage bone on bone degenerative arthritis of the knee who failed conservative treatment, including injections, antiinflammatories, activity modification, and assistive devices, and had significant impairment of their activities of daily living, and elected for Total Knee Arthroplasty.   The risks, benefits, and alternatives were discussed at length including but not limited to the risks of infection, bleeding, nerve injury, stiffness, blood clots, the need for revision surgery, cardiopulmonary complications, among others, and they were willing to proceed.   OPERATIVE DESCRIPTION:  The patient was brought to the operative room and placed in a supine position.  General anesthesia was administered.  IV antibiotics were given.  The lower extremity was prepped and draped in the usual sterile fashion.  Time out was performed.  The leg was elevated and exsanguinated and the tourniquet was inflated.  Anterior approach was performed.  The patella was everted and osteophytes were removed.  The anterior horn of the medial and lateral meniscus was removed.   The distal femur was opened with the drill and the intramedullary distal femoral cutting jig was utilized, set at 5 degrees resecting 8 mm off the distal femur.  Care was  taken to protect the collateral ligaments.  The distal femoral sizing jig was applied, taking care to avoid notching.  Then the 4-in-1 cutting jig was applied and the anterior and posterior femur was cut, along with the chamfer cuts.  All posterior osteophytes were removed.  The flexion gap was then measured and was symmetric with the extension gap.  Then the extramedullary tibial cutting jig was utilized making the appropriate cut using the anterior tibial crest as a reference building in appropriate posterior slope.  Care was taken during the cut to protect the medial and collateral ligaments.  The proximal tibia was removed along with the posterior horns of the menisci.    I completed the distal femoral preparation using the appropriate jig to prepare the box.  The patella was then measured, and cut with the saw.    The proximal tibia sized and prepared accordingly with the reamer and the punch, and then all components were trialed with the above sized poly insert.  The knee was found to have excellent balance and full motion.    The above named components were then impacted into place and Poly tibial piece and patella were inserted.  I was very happy with his stability and ROM  I performed a periarticular injection with Exparel  The knee was easily taken through a range of motion and the patella tracked well and the knee irrigated copiously and the parapatellar and subcutaneous tissue closed with vicryl, and monocryl with steri strips for the skin.  The incision was dressed with sterile gauze and the tourniquet released and the patient was awakened and returned to the  PACU in stable and satisfactory condition.  There were no complications.  Total tourniquet time was roughly 60 minutes.   POSTOPERATIVE PLAN: post op Abx, DVT px: SCD's, TED's, Early ambulation and chemical px

## 2022-12-22 NOTE — Evaluation (Signed)
Physical Therapy Evaluation Patient Details Name: [name] MRN: [id number] DOB: 08-Aug-1960 Today's Date: 12/22/2022  History of Present Illness  62 y.o. female admitted 12/22/22 for R TKA. PMH: MS, OSA, bipolar, anxiety, fibromyalgia, L fibula fx s/p ORIF 07/16/20, back pain, respiratory failure (on 2L O2 at night), obesity, lewy body dementia.  Clinical Impression  Pt admitted with above diagnosis. Min assist for supine to sit. Min assist sit to stand with RW. RLE buckled in standing, so pt returned to sitting at edge of bed. Pt remained sitting at edge of bed eating her lunch at end of PT session.  Good progress expected.  Pt currently with functional limitations due to the deficits listed below (see PT Problem List). Pt will benefit from acute skilled PT to increase their independence and safety with mobility to allow discharge.          Recommendations for follow up therapy are one component of a multi-disciplinary discharge planning process, led by the attending physician.  Recommendations may be updated based on patient status, additional functional criteria and insurance authorization.  Follow Up Recommendations       Assistance Recommended at Discharge Intermittent Supervision/Assistance  Patient can return home with the following  A little help with bathing/dressing/bathroom;A little help with walking and/or transfers;Assistance with cooking/housework;Assist for transportation;Help with stairs or ramp for entrance    Equipment Recommendations Rolling walker (2 wheels)  Recommendations for Other Services       Functional Status Assessment Patient has had a recent decline in their functional status and demonstrates the ability to make significant improvements in function in a reasonable and predictable amount of time.     Precautions / Restrictions Precautions Precautions: Knee Restrictions Weight Bearing Restrictions: No RLE Weight Bearing: Weight bearing as  tolerated      Mobility  Bed Mobility Overal bed mobility: Needs Assistance Bed Mobility: Supine to Sit     Supine to sit: Min assist     General bed mobility comments: min A to support RLE    Transfers Overall transfer level: Needs assistance Equipment used: Rolling walker (2 wheels) Transfers: Sit to/from Stand Sit to Stand: Min assist, From elevated surface           General transfer comment: VCs hand placement, min A to power up, R knee buckled in standing so returned to sitting on EOB    Ambulation/Gait               General Gait Details: NT 2* RLE buckled in standing  Stairs            Wheelchair Mobility    Modified Rankin (Stroke Patients Only)       Balance Overall balance assessment: Needs assistance   Sitting balance-Leahy Scale: Good     Standing balance support: Bilateral upper extremity supported, During functional activity, Reliant on assistive device for balance Standing balance-Leahy Scale: Poor                               Pertinent Vitals/Pain Pain Assessment Pain Assessment: 0-10 Pain Score: 7  Pain Location: R knee Pain Descriptors / Indicators: Sore Pain Intervention(s): Limited activity within patient's tolerance, Monitored during session, Premedicated before session, Repositioned, Ice applied    Home Living Family/patient expects to be discharged to:: Private residence Living Arrangements: Children;Other relatives Available Help at Discharge: Family;Available 24 hours/day Type of Home: House Home Access: Stairs to enter Entrance Stairs-Rails: Right  Entrance Stairs-Number of Steps: 4 Alternate Level Stairs-Number of Steps: 5 Home Layout: Two level Home Equipment: Rollator (4 wheels);Cane - single point;Shower seat Additional Comments: lives with son and sister    Prior Function Prior Level of Function : Independent/Modified Independent             Mobility Comments: no falls in past 6  months, cane in home, rollator when going out ADLs Comments: assist for bathing/dressing; doesn't drive     Hand Dominance        Extremity/Trunk Assessment   Upper Extremity Assessment Upper Extremity Assessment: Overall WFL for tasks assessed    Lower Extremity Assessment Lower Extremity Assessment: RLE deficits/detail RLE Deficits / Details: knee ext +2/5 RLE: Unable to fully assess due to pain RLE Sensation: decreased light touch    Cervical / Trunk Assessment Cervical / Trunk Assessment: Normal  Communication   Communication: No difficulties  Cognition Arousal/Alertness: Awake/alert Behavior During Therapy: WFL for tasks assessed/performed Overall Cognitive Status: Within Functional Limits for tasks assessed                                          General Comments      Exercises Total Joint Exercises Ankle Circles/Pumps: AROM, Both, 10 reps, Seated Long Arc Quad: AROM, Right, 5 reps, Seated   Assessment/Plan    PT Assessment Patient needs continued PT services  PT Problem List Decreased range of motion;Decreased strength;Decreased balance;Pain;Decreased mobility;Decreased activity tolerance       PT Treatment Interventions Gait training;Patient/family education;Stair training;Therapeutic exercise;DME instruction    PT Goals (Current goals can be found in the Care Plan section)  Acute Rehab PT Goals Patient Stated Goal[facility] Formulation: With patient/family Time For Goal Achievement: 12/29/22 Potential to Achieve Goals: Good    Frequency 7X/week     Co-evaluation               AM-PAC PT "6 Clicks" Mobility  Outcome Measure Help needed turning from your back to your side while in a flat bed without using bedrails?: A Little Help needed moving from lying on your back to sitting on the side of a flat bed without using bedrails?: A Little Help needed moving to and from a bed to a  chair (including a wheelchair)?: A Little Help needed standing up from a chair using your arms (e.g., wheelchair or bedside chair)?: A Lot Help needed to walk in hospital room?: Total Help needed climbing 3-5 steps with a railing? : Total 6 Click Score: 13    End of Session Equipment Utilized During Treatment: Gait belt Activity Tolerance: Patient limited by pain Patient left: in bed;with bed alarm set;with call bell/phone within reach;with family/visitor present Nurse Communication: Mobility status PT Visit Diagnosis: Muscle weakness (generalized) (M62.81);Difficulty in walking, not elsewhere classified (R26.2);Pain Pain - Right/Left: Right Pain - part of body: Knee    Time: 0981-1914 PT Time Calculation (min) (ACUTE ONLY): 15 min   Charges:   PT Evaluation $PT Eval Moderate Complexity: 1 Mod          Tamala Ser PT 12/22/2022  Acute Rehabilitation Services  Office [phone]

## 2022-12-22 NOTE — Anesthesia Procedure Notes (Signed)
Anesthesia Regional Block: Adductor canal block   Pre-Anesthetic Checklist: , timeout performed,  Correct Patient, Correct Site, Correct Laterality,  Correct Procedure, Correct Position, site marked,  Risks and benefits discussed,  Surgical consent,  Pre-op evaluation,  At surgeon's request and post-op pain management  Laterality: Right and Lower  Prep: chloraprep       Needles:  Injection technique: Single-shot  Needle Type: Echogenic Needle     Needle Length: 9cm  Needle Gauge: 21     Additional Needles:   Procedures:,,,, ultrasound used (permanent image in chart),,    Narrative:  Start time: 12/22/2022 7:03 AM End time: 12/22/2022 7:10 AM Injection made incrementally with aspirations every 5 mL.  Performed by: Personally  Anesthesiologist: Jairo Ben, MD  Additional Notes: Pt identified in Holding room.  Monitors applied. Working IV access confirmed. Timeout, Sterile prep R thigh.  #21ga ECHOgenic Arrow block needle into adductor canal with US guidance.  20cc 0.75% Ropivacaine injected incrementally after negative test dose.  Patient asymptomatic, VSS, no heme aspirated, tolerated well.   Sandford Craze, MD

## 2022-12-22 NOTE — Anesthesia Procedure Notes (Signed)
Spinal  Patient location during procedure: OR End time: 12/22/2022 7:33 AM Reason for block: surgical anesthesia Staffing Performed: anesthesiologist  Anesthesiologist: Jairo Ben, MD Performed by: Jairo Ben, MD Authorized by: Jairo Ben, MD   Preanesthetic Checklist Completed: patient identified, IV checked, site marked, risks and benefits discussed, surgical consent, monitors and equipment checked, pre-op evaluation and timeout performed Spinal Block Patient position: sitting Prep: DuraPrep Patient monitoring: heart rate, cardiac monitor, continuous pulse ox and blood pressure Approach: midline Location: L3-4 Injection technique: single-shot Needle Needle type: Quincke  Needle gauge: 22 G Needle length: 15 cm Assessment Sensory level: T4 Events: CSF return Additional Notes Pt identified in Operating room.  Monitors applied. Working IV access confirmed. Sterile prep, drape lumbar spine.  1% lido local L 3,4.  #24ga Pencan, unable to reach CSF, #22ga long Quincke and into clear CSF L 3,4.  12mg  0.75% Bupivacaine with dextrose injected with asp CSF beginning and end of injection.  Patient asymptomatic, VSS, no heme aspirated, tolerated well.  Sandford Craze, MD

## 2022-12-22 NOTE — Progress Notes (Signed)
Call to pharmacy to discuss PCN "reaction" Discussed with Karie Soda - pharmacist. Per pharmacy ok to proceed with Ancef. Qualifying statement from Dr. Eulah Pont in chart to proceed with Ancef.

## 2022-12-22 NOTE — Anesthesia Postprocedure Evaluation (Signed)
Anesthesia Post Note  Patient: [name]  Procedure(s) Performed: TOTAL KNEE ARTHROPLASTY (Right: Knee)     Patient location during evaluation: PACU Anesthesia Type: Spinal Level of consciousness: awake and alert, patient cooperative and oriented Pain management: pain level controlled Vital Signs Assessment: post-procedure vital signs reviewed and stable Respiratory status: nonlabored ventilation, spontaneous breathing and respiratory function stable Cardiovascular status: blood pressure returned to baseline and stable Postop Assessment: no apparent nausea or vomiting and spinal receding Anesthetic complications: no   No notable events documented.  Last Vitals:  Vitals:   12/22/22 1015 12/22/22 1030  BP: (!) 161/94 (!) 168/84  Pulse: 62 65  Resp: 14 17  Temp:    SpO2: 96% 95%    Last Pain:  Vitals:   12/22/22 1030  TempSrc:   PainSc: Asleep                 Guida Asman,E. Essa Malachi

## 2022-12-22 NOTE — Interval H&P Note (Signed)
History and Physical Interval Note:  12/22/2022 6:51 AM  [name]  has presented today for surgery, with the diagnosis of OA RIGHT KNEE.  The various methods of treatment have been discussed with the patient and family. After consideration of risks, benefits and other options for treatment, the patient has consented to  Procedure(s): TOTAL KNEE ARTHROPLASTY (Right) as a surgical intervention.  The patient's history has been reviewed, patient examined, no change in status, stable for surgery.  I have reviewed the patient's chart and labs.  Questions were answered to the patient's satisfaction.     Sheral Apley

## 2022-12-22 NOTE — Transfer of Care (Signed)
Immediate Anesthesia Transfer of Care Note  Patient: [name]  Procedure(s) Performed: TOTAL KNEE ARTHROPLASTY (Right: Knee)  Patient Location: PACU  Anesthesia Type:Spinal and MAC combined with regional for post-op pain  Level of Consciousness: awake, alert , and oriented  Airway & Oxygen Therapy: Patient Spontanous Breathing  Post-op Assessment: Report given to RN and Post -op Vital signs reviewed and stable  Post vital signs: Reviewed and stable  Last Vitals:  Vitals Value Taken Time  BP 148/78 12/22/22 0931  Temp    Pulse 77 12/22/22 0933  Resp 22 12/22/22 0933  SpO2 96 % 12/22/22 0933  Vitals shown include unvalidated device data.  Last Pain:  Vitals:   12/22/22 0639  TempSrc:   PainSc: 8          Complications: No notable events documented.

## 2022-12-22 NOTE — Progress Notes (Signed)
Orthopedic Tech Progress Note Patient Details:  [name] 1961/01/12 [id number]  CPM Right Knee CPM Right Knee: On Right Knee Flexion (Degrees): 90 Right Knee Extension (Degrees): 0  Post Interventions Patient Tolerated: Well Instructions Provided: Adjustment of device, Care of device  [name] 12/22/2022, 10:04 AM

## 2022-12-23 ENCOUNTER — Encounter (HOSPITAL_COMMUNITY): Payer: Self-pay | Admitting: Orthopedic Surgery

## 2022-12-23 MED ORDER — PREDNISONE 5 MG PO TABS
10.0000 mg | ORAL_TABLET | Freq: Four times a day (QID) | ORAL | Status: DC
  Administered 2022-12-23 – 2022-12-26 (×12): 10 mg via ORAL
  Filled 2022-12-23 (×12): qty 2

## 2022-12-23 MED ORDER — ACETAMINOPHEN 500 MG PO TABS
1000.0000 mg | ORAL_TABLET | Freq: Four times a day (QID) | ORAL | Status: DC
  Administered 2022-12-23 – 2022-12-26 (×10): 1000 mg via ORAL
  Filled 2022-12-23 (×11): qty 2

## 2022-12-23 NOTE — Progress Notes (Signed)
Physical Therapy Treatment Patient Details Name: [name] MRN: [id number] DOB: 1960/07/30 Today's Date: 12/23/2022   History of Present Illness 62 y.o. female admitted 12/22/22 for R TKA. PMH: MS, OSA, bipolar, anxiety, fibromyalgia, L fibula fx s/p ORIF 07/16/20, back pain, respiratory failure (on 2L O2 at night), obesity, lewy body dementia.    PT Comments    Pt is progressing slowly with mobility. She states that at baseline she was only able to ambulate ~20' with a walker, and was sitting down to ascend/descend stairs. Min/mod assist for sit to stand, pt tremulous and unsteady in standing. MIn assist to pivot from recliner to bed, pt did not feel she could attempt ambulation 2* fatigue. Performed TKA HEP with assistance. She will likely need several more days of acute PT before she will be ready to safely DC home.     Recommendations for follow up therapy are one component of a multi-disciplinary discharge planning process, led by the attending physician.  Recommendations may be updated based on patient status, additional functional criteria and insurance authorization.  Follow Up Recommendations       Assistance Recommended at Discharge Intermittent Supervision/Assistance  Patient can return home with the following Assistance with cooking/housework;Assist for transportation;Help with stairs or ramp for entrance;A lot of help with bathing/dressing/bathroom;A lot of help with walking and/or transfers   Equipment Recommendations  Rolling walker (2 wheels)    Recommendations for Other Services       Precautions / Restrictions Precautions Precautions: Knee Precaution Booklet Issued: Yes (comment) Precaution Comments: reviewed no pillow under knee Restrictions Weight Bearing Restrictions: No RLE Weight Bearing: Weight bearing as tolerated     Mobility  Bed Mobility Overal bed mobility: Needs Assistance Bed Mobility: Sit to Supine     Supine to sit: Mod assist Sit  to supine: Mod assist   General bed mobility comments: assist for BLEs into bed    Transfers Overall transfer level: Needs assistance Equipment used: Rolling walker (2 wheels) Transfers: Sit to/from Stand, Bed to chair/wheelchair/BSC Sit to Stand: Mod assist, From elevated surface, Min assist   Step pivot transfers: Min assist       General transfer comment: VCs hand placement, min/mod A to power up from elevated bed, pt tremulous (she reports h/o parkinsons) and unsteady in standing so pivoted to recliner with min A for balance and VCs for sequencing    Ambulation/Gait               General Gait Details: unable 2* unsteadiness and fatigue in standing   Stairs             Wheelchair Mobility    Modified Rankin (Stroke Patients Only)       Balance Overall balance assessment: Needs assistance   Sitting balance-Leahy Scale: Good     Standing balance support: Bilateral upper extremity supported, During functional activity, Reliant on assistive device for balance Standing balance-Leahy Scale: Poor                              Cognition Arousal/Alertness: Awake/alert Behavior During Therapy: WFL for tasks assessed/performed Overall Cognitive Status: Within Functional Limits for tasks assessed                                          Exercises Total Joint Exercises Ankle Circles/Pumps: AROM, Both,  Seated, 15 reps Quad Sets: AROM, Both, 5 reps, Supine Short Arc Quad: AAROM, Right, 5 reps, Supine Heel Slides: AAROM, Right, 5 reps, Supine Hip ABduction/ADduction: AAROM, Right, 5 reps, Supine Long Arc Quad: Right, 5 reps, Seated, AAROM Knee Flexion: AAROM, Right, 5 reps, Seated Goniometric ROM: 0-30* AAROM R knee    General Comments        Pertinent Vitals/Pain Pain Assessment Pain Score: 7  Pain Location: R knee Pain Descriptors / Indicators: Sore Pain Intervention(s): Limited activity within patient's tolerance,  Monitored during session, Premedicated before session, Ice applied    Home Living                          Prior Function            PT Goals (current goals can now be found in the care plan section) Acute Rehab PT Goals Patient Stated Goal[facility] Formulation: With patient/family Time For Goal Achievement: 12/29/22 Potential to Achieve Goals: Good Progress towards PT goals: Progressing toward goals    Frequency    7X/week      PT Plan Current plan remains appropriate    Co-evaluation              AM-PAC PT "6 Clicks" Mobility   Outcome Measure  Help needed turning from your back to your side while in a flat bed without using bedrails?: A Lot Help needed moving from lying on your back to sitting on the side of a flat bed without using bedrails?: A Lot Help needed moving to and from a bed to a chair (including a wheelchair)?: A Lot Help needed standing up from a chair using your arms (e.g., wheelchair or bedside chair)?: A Lot Help needed to walk in hospital room?: Total Help needed climbing 3-5 steps with a railing? : Total 6 Click Score: 10    End of Session Equipment Utilized During Treatment: Gait belt Activity Tolerance: Patient limited by pain;Patient limited by fatigue Patient left: with call bell/phone within reach;in bed;with family/visitor present;with bed alarm set Nurse Communication: Mobility status PT Visit Diagnosis: Muscle weakness (generalized) (M62.81);Difficulty in walking, not elsewhere classified (R26.2);Pain Pain - Right/Left: Right Pain - part of body: Knee     Time: 1610-9604 PT Time Calculation (min) (ACUTE ONLY): 16 min  Charges:   $Therapeutic Activity: 8-22 mins                     Ralene Bathe Kistler PT 12/23/2022  Acute Rehabilitation Services  Office [phone]

## 2022-12-23 NOTE — Progress Notes (Signed)
Orthopedic Tech Progress Note Patient Details:  [name] 1961-01-06 [id number]  Patient ID: [name], female   DOB: May 20, 1961, 62 y.o.   MRN: [id number]  [name] 12/23/2022, 11:02 AM Cpm applied. Patient able to tolerate 40 degrees only

## 2022-12-23 NOTE — TOC Transition Note (Signed)
Transition of Care [facility]) - CM/SW Discharge Note   Patient Details  Name: [name] MRN: [id number] Date of Birth: [birth year]  [facility] CM/SW Contact:  Amada Jupiter, LCSW Phone Number: 12/23/2022, 9:59 AM   Clinical Narrative:     Met with pt who confirms she has needed DME in the home.  HHPT prearranged with [facility].  No TOC needs.   Final next level of care: Home w Home Health Services Barriers to Discharge: No Barriers Identified   Patient Goals and CMS Choice      Discharge Placement                         Discharge Plan and Services Additional resources added to the After Visit Summary for                  DME Arranged: CPM DME Agency: [employer]       HH Arranged: PT HH Agency: [facility]        Social Determinants of Health (SDOH) Interventions SDOH Screenings   Food Insecurity: No Food Insecurity (12/22/2022)  Housing: Low Risk  (12/22/2022)  Transportation Needs: No Transportation Needs (12/22/2022)  Utilities: Not At Risk (12/22/2022)  Alcohol Screen: Low Risk  (03/12/2022)  Depression (PHQ2-9): High Risk (10/29/2022)  Financial Resource Strain: Low Risk  (03/12/2022)  Physical Activity: Insufficiently Active (03/12/2022)  Social Connections: Moderately Integrated (03/12/2022)  Stress: Stress Concern Present (03/12/2022)  Tobacco Use: Medium Risk (12/22/2022)     Readmission Risk Interventions     No data to display

## 2022-12-23 NOTE — Progress Notes (Signed)
Orthopedic Tech Progress Note Patient Details:  [name] 05-08-1961 [id number]  CPM Right Knee CPM Right Knee: On Right Knee Flexion (Degrees): 40 Right Knee Extension (Degrees): 0  Post Interventions Patient Tolerated: Well Instructions Provided: Adjustment of device, Care of device  [name] 12/23/2022, 2:52 PM

## 2022-12-23 NOTE — Plan of Care (Signed)
  Problem: Education: Goal: Knowledge of the prescribed therapeutic regimen will improve Outcome: Progressing   Problem: Activity: Goal: Ability to avoid complications of mobility impairment will improve Outcome: Progressing   Problem: Pain Management: Goal: Pain level will decrease with appropriate interventions Outcome: Progressing   

## 2022-12-23 NOTE — Care Management Obs Status (Signed)
MEDICARE OBSERVATION STATUS NOTIFICATION   Patient Details  Name: [name] MRN: [id number] Date of Birth: [birth year]   Medicare Observation Status Notification Given:  Yes    Amada Jupiter, LCSW 12/23/2022, 11:30 AM

## 2022-12-23 NOTE — Progress Notes (Signed)
Orthopedic Tech Progress Note Patient Details:  [name] Feb 12, 1961 [id number]  Patient ID: [name], female   DOB: 1960/11/15, 62 y.o.   MRN: [id number]  [name] 12/23/2022, 4:57 PM Cpm removed

## 2022-12-23 NOTE — Progress Notes (Signed)
    Subjective: Patient reports pain as severe.  Tolerating diet.  Urinating.   No CP, SOB.  Has mobilized some OOB with PT but limited by pain and weakness.  Objective:   VITALS:   Vitals:   12/23/22 0146 12/23/22 0626 12/23/22 0843 12/23/22 1004  BP: 127/68 (!) 120/58  (!) 141/66  Pulse: 77 74  82  Resp: 18 18  18   Temp: 97.9 F (36.6 C) 97.9 F (36.6 C)  98.5 F (36.9 C)  TempSrc: Oral Oral    SpO2: 96% 96% 96% 96%  Weight:      Height:          Latest Ref Rng & Units 12/09/2022   11:37 AM 10/14/2021    9:26 AM 08/04/2021   10:50 AM  CBC  WBC 4.0 - 10.5 K/uL 5.7  4.4  6.1   Hemoglobin 12.0 - 15.0 g/dL 16.1  09.6  04.5   Hematocrit 36.0 - 46.0 % 42.7  39.4  40.7   Platelets 150 - 400 K/uL 308  282.0  330.0       Latest Ref Rng & Units 12/09/2022   11:37 AM 10/14/2021    9:26 AM 08/04/2021   10:50 AM  BMP  Glucose 70 - 99 mg/dL 409  811  914   BUN 6 - 20 mg/dL 18  11  13    Creatinine 0.44 - 1.00 mg/dL 7.82  9.56  2.13   Sodium 135 - 145 mmol/L 137  137  138   Potassium 3.5 - 5.1 mmol/L 3.9  4.1  3.4   Chloride 98 - 111 mmol/L 103  100  99   CO2 22 - 32 mmol/L 24  26  26    Calcium 8.9 - 10.3 mg/dL 9.1  9.4  9.7    Intake/Output      06/18 0701 06/19 0700 06/19 0701 06/20 0700   P.O. 840 120   I.V. (mL/kg) 1200 (10.3)    IV Piggyback 438.8 0   Total Intake(mL/kg) 2478.8 (21.2) 120 (1)   Urine (mL/kg/hr) 2900 (1) 100 (0.1)   Stool 0    Blood 75    Total Output 2975 100   Net -496.2 +20        Stool Occurrence 0 x       Physical Exam: General: NAD.  Sitting up in bedside chair, calm Resp: No increased wob Cardio: regular rate and rhythm ABD soft Neurologically intact MSK Neurovascularly intact Sensation intact distally Intact pulses distally Dorsiflexion/Plantar flexion intact Incision: dressing C/D/I   Assessment: 1 Day Post-Op  S/P Procedure(s) (LRB): TOTAL KNEE ARTHROPLASTY (Right) by Dr. Jewel Baize. Murphy on 12/22/22  Principal Problem:    S/P total knee arthroplasty, right   Plan: Work on better pain management and mobilization  Advance diet Up with therapy Incentive Spirometry Elevate and Apply ice  Weightbearing: WBAT RLE Insicional and dressing care: Dressings left intact until follow-up and Reinforce dressings as needed Orthopedic device(s):  CPM Showering: Keep dressing dry VTE prophylaxis: Aspirin 81mg  BID  x 30 days postop , SCDs, ambulation Pain control: PRN Follow - up plan: 2 weeks Contact information:  Margarita Rana MD, Levester Fresh PA-C  Dispo: Home with HHPT once passes PT evaluation here.     Jenne Pane, PA-C Office [phone] 12/23/2022, 12:45 PM

## 2022-12-23 NOTE — Progress Notes (Signed)
Physical Therapy Treatment Patient Details Name: [name] MRN: [id number] DOB: 1960-11-29 Today's Date: 12/23/2022   History of Present Illness 62 y.o. female admitted 12/22/22 for R TKA. PMH: MS, OSA, bipolar, anxiety, fibromyalgia, L fibula fx s/p ORIF 07/16/20, back pain, respiratory failure (on 2L O2 at night), obesity, lewy body dementia.    PT Comments    Pt required mod assist for supine to sit and sit to stand from an elevated bed. Pt is tremulous, she reports this is baseline 2* Parkinsons. She stated she is not on Parkinsons medication because it interfered with another medication she was on. H/o Parkinsons is not noted in pt's H&P. In standing pt is quite unsteady and required mod assist for balance. She was able to pivot from bed to recliner with RW with mod assist due to unsteadiness. She will not be ready to DC home today from a mobility standpoint.     Recommendations for follow up therapy are one component of a multi-disciplinary discharge planning process, led by the attending physician.  Recommendations may be updated based on patient status, additional functional criteria and insurance authorization.  Follow Up Recommendations       Assistance Recommended at Discharge Intermittent Supervision/Assistance  Patient can return home with the following Assistance with cooking/housework;Assist for transportation;Help with stairs or ramp for entrance;A lot of help with bathing/dressing/bathroom;A lot of help with walking and/or transfers   Equipment Recommendations  Rolling walker (2 wheels)    Recommendations for Other Services       Precautions / Restrictions Precautions Precautions: Knee Precaution Booklet Issued: Yes (comment) Precaution Comments: reviewed no pillow under knee Restrictions Weight Bearing Restrictions: No RLE Weight Bearing: Weight bearing as tolerated     Mobility  Bed Mobility Overal bed mobility: Needs Assistance Bed Mobility: Supine  to Sit     Supine to sit: Mod assist     General bed mobility comments: assist to raise trunk and pivot hips to EOB, gait belt used as RLE lifter    Transfers Overall transfer level: Needs assistance Equipment used: Rolling walker (2 wheels) Transfers: Sit to/from Stand, Bed to chair/wheelchair/BSC Sit to Stand: Mod assist, From elevated surface   Step pivot transfers: Mod assist       General transfer comment: VCs hand placement, mod A to power up from elevated bed, pt tremulous (she reports h/o parkinsons) and unsteady in standing so pivoted to recliner with mod A for balance and VCs for sequencing    Ambulation/Gait               General Gait Details: unable 2* unsteadiness in standing   Stairs             Wheelchair Mobility    Modified Rankin (Stroke Patients Only)       Balance Overall balance assessment: Needs assistance   Sitting balance-Leahy Scale: Good     Standing balance support: Bilateral upper extremity supported, During functional activity, Reliant on assistive device for balance Standing balance-Leahy Scale: Poor                              Cognition Arousal/Alertness: Awake/alert Behavior During Therapy: WFL for tasks assessed/performed Overall Cognitive Status: Within Functional Limits for tasks assessed  Exercises Total Joint Exercises Ankle Circles/Pumps: AROM, Both, 10 reps, Seated Quad Sets: AROM, Both, 5 reps, Supine Short Arc Quad: AAROM, Right, 5 reps, Supine Heel Slides: AAROM, Right, 5 reps, Supine Long Arc Quad: Right, 5 reps, Seated, AAROM Knee Flexion: AAROM, Right, 5 reps, Seated    General Comments        Pertinent Vitals/Pain Pain Assessment Pain Score: 7  Pain Location: R knee Pain Descriptors / Indicators: Sore Pain Intervention(s): Limited activity within patient's tolerance, Monitored during session, Premedicated before session,  Ice applied    Home Living                          Prior Function            PT Goals (current goals can now be found in the care plan section) Acute Rehab PT Goals Patient Stated Goal[facility] Formulation: With patient/family Time For Goal Achievement: 12/29/22 Potential to Achieve Goals: Good Progress towards PT goals: Progressing toward goals    Frequency    7X/week      PT Plan Current plan remains appropriate    Co-evaluation              AM-PAC PT "6 Clicks" Mobility   Outcome Measure  Help needed turning from your back to your side while in a flat bed without using bedrails?: A Lot Help needed moving from lying on your back to sitting on the side of a flat bed without using bedrails?: A Lot Help needed moving to and from a bed to a chair (including a wheelchair)?: A Lot Help needed standing up from a chair using your arms (e.g., wheelchair or bedside chair)?: A Lot Help needed to walk in hospital room?: Total Help needed climbing 3-5 steps with a railing? : Total 6 Click Score: 10    End of Session Equipment Utilized During Treatment: Gait belt Activity Tolerance: Patient limited by pain Patient left: in chair;with chair alarm set;with call bell/phone within reach Nurse Communication: Mobility status PT Visit Diagnosis: Muscle weakness (generalized) (M62.81);Difficulty in walking, not elsewhere classified (R26.2);Pain Pain - Right/Left: Right Pain - part of body: Knee     Time: 9518-8416 PT Time Calculation (min) (ACUTE ONLY): 31 min  Charges:  $Gait Training: 8-22 mins $Therapeutic Exercise: 8-22 mins                     Ralene Bathe Kistler PT 12/23/2022  Acute Rehabilitation Services  Office [phone]

## 2022-12-24 DIAGNOSIS — Z91030 Bee allergy status: Secondary | ICD-10-CM | POA: Diagnosis not present

## 2022-12-24 DIAGNOSIS — J9611 Chronic respiratory failure with hypoxia: Secondary | ICD-10-CM | POA: Diagnosis present

## 2022-12-24 DIAGNOSIS — F411 Generalized anxiety disorder: Secondary | ICD-10-CM | POA: Diagnosis present

## 2022-12-24 DIAGNOSIS — Z91010 Allergy to peanuts: Secondary | ICD-10-CM | POA: Diagnosis not present

## 2022-12-24 DIAGNOSIS — Z79899 Other long term (current) drug therapy: Secondary | ICD-10-CM | POA: Diagnosis not present

## 2022-12-24 DIAGNOSIS — G8929 Other chronic pain: Secondary | ICD-10-CM | POA: Diagnosis present

## 2022-12-24 DIAGNOSIS — Z91040 Latex allergy status: Secondary | ICD-10-CM | POA: Diagnosis not present

## 2022-12-24 DIAGNOSIS — F250 Schizoaffective disorder, bipolar type: Secondary | ICD-10-CM | POA: Diagnosis present

## 2022-12-24 DIAGNOSIS — Z8249 Family history of ischemic heart disease and other diseases of the circulatory system: Secondary | ICD-10-CM | POA: Diagnosis not present

## 2022-12-24 DIAGNOSIS — M25561 Pain in right knee: Secondary | ICD-10-CM | POA: Diagnosis present

## 2022-12-24 DIAGNOSIS — Z888 Allergy status to other drugs, medicaments and biological substances status: Secondary | ICD-10-CM | POA: Diagnosis not present

## 2022-12-24 DIAGNOSIS — K219 Gastro-esophageal reflux disease without esophagitis: Secondary | ICD-10-CM | POA: Diagnosis present

## 2022-12-24 DIAGNOSIS — J4540 Moderate persistent asthma, uncomplicated: Secondary | ICD-10-CM | POA: Diagnosis present

## 2022-12-24 DIAGNOSIS — Z820 Family history of epilepsy and other diseases of the nervous system: Secondary | ICD-10-CM | POA: Diagnosis not present

## 2022-12-24 DIAGNOSIS — M797 Fibromyalgia: Secondary | ICD-10-CM | POA: Diagnosis present

## 2022-12-24 DIAGNOSIS — Z87891 Personal history of nicotine dependence: Secondary | ICD-10-CM | POA: Diagnosis not present

## 2022-12-24 DIAGNOSIS — M1711 Unilateral primary osteoarthritis, right knee: Secondary | ICD-10-CM | POA: Diagnosis present

## 2022-12-24 DIAGNOSIS — I10 Essential (primary) hypertension: Secondary | ICD-10-CM | POA: Diagnosis present

## 2022-12-24 DIAGNOSIS — G3183 Dementia with Lewy bodies: Secondary | ICD-10-CM | POA: Diagnosis present

## 2022-12-24 DIAGNOSIS — G40909 Epilepsy, unspecified, not intractable, without status epilepticus: Secondary | ICD-10-CM | POA: Diagnosis present

## 2022-12-24 DIAGNOSIS — E782 Mixed hyperlipidemia: Secondary | ICD-10-CM | POA: Diagnosis present

## 2022-12-24 DIAGNOSIS — Z880 Allergy status to penicillin: Secondary | ICD-10-CM | POA: Diagnosis not present

## 2022-12-24 DIAGNOSIS — F0284 Dementia in other diseases classified elsewhere, unspecified severity, with anxiety: Secondary | ICD-10-CM | POA: Diagnosis present

## 2022-12-24 DIAGNOSIS — G4733 Obstructive sleep apnea (adult) (pediatric): Secondary | ICD-10-CM | POA: Diagnosis present

## 2022-12-24 DIAGNOSIS — J4489 Other specified chronic obstructive pulmonary disease: Secondary | ICD-10-CM | POA: Diagnosis present

## 2022-12-24 MED ORDER — SODIUM CHLORIDE 0.9 % IV BOLUS
1000.0000 mL | Freq: Once | INTRAVENOUS | Status: AC
  Administered 2022-12-24: 1000 mL via INTRAVENOUS

## 2022-12-24 NOTE — Progress Notes (Signed)
Subjective: Patient reports pain as severe.  Tolerating diet.  Urinating.   No CP, SOB.  Has mobilized some OOB with PT but limited by pain and weakness. Also sounds like anticipatory anxiety is holding her back.  Objective:   VITALS:   Vitals:   12/24/22 0607 12/24/22 0754 12/24/22 1003 12/24/22 1401  BP: 128/74  137/74 (!) 145/90  Pulse: 78  83 81  Resp: 16  17 16   Temp: 98.2 F (36.8 C)  98 F (36.7 C) 97.8 F (36.6 C)  TempSrc: Oral  Oral Oral  SpO2: 95% 96% 95% 99%  Weight:      Height:          Latest Ref Rng & Units 12/09/2022   11:37 AM 10/14/2021    9:26 AM 08/04/2021   10:50 AM  CBC  WBC 4.0 - 10.5 K/uL 5.7  4.4  6.1   Hemoglobin 12.0 - 15.0 g/dL 16.1  09.6  04.5   Hematocrit 36.0 - 46.0 % 42.7  39.4  40.7   Platelets 150 - 400 K/uL 308  282.0  330.0       Latest Ref Rng & Units 12/09/2022   11:37 AM 10/14/2021    9:26 AM 08/04/2021   10:50 AM  BMP  Glucose 70 - 99 mg/dL 409  811  914   BUN 6 - 20 mg/dL 18  11  13    Creatinine 0.44 - 1.00 mg/dL 7.82  9.56  2.13   Sodium 135 - 145 mmol/L 137  137  138   Potassium 3.5 - 5.1 mmol/L 3.9  4.1  3.4   Chloride 98 - 111 mmol/L 103  100  99   CO2 22 - 32 mmol/L 24  26  26    Calcium 8.9 - 10.3 mg/dL 9.1  9.4  9.7    Intake/Output      06/19 0701 06/20 0700 06/20 0701 06/21 0700   P.O. 840 120   I.V. (mL/kg)     IV Piggyback 0 0   Total Intake(mL/kg) 840 (7.2) 120 (1)   Urine (mL/kg/hr) 1700 (0.6) 950 (0.9)   Stool 0    Blood     Total Output 1700 950   Net -860 -830        Stool Occurrence 0 x       Physical Exam: General: NAD.  Sitting up in bed, eating, calm  Resp: No increased wob Cardio: regular rate and rhythm ABD soft Neurologically intact MSK Neurovascularly intact Sensation intact distally Intact pulses distally Dorsiflexion/Plantar flexion intact Incision: dressing C/D/I   Assessment: 2 Days Post-Op  S/P Procedure(s) (LRB): TOTAL KNEE ARTHROPLASTY (Right) by Dr. Jewel Baize.  Murphy on 12/22/22  Principal Problem:   S/P total knee arthroplasty, right   Plan: Continue to work on better pain management and mobilization.  I added Prednisone to her daily medicines to help with pain and inflammation. I ordered a fluid bolus today since she has been feeling weak.  Talked at length about having a positive mindset when trying to move and not expecting herself to fail before she has even tried. Seems motivated to improve. Need to keep pushing her.  Advance diet Up with therapy Incentive Spirometry Elevate and Apply ice  Weightbearing: WBAT RLE Insicional and dressing care: Dressings left intact until follow-up and Reinforce dressings as needed Orthopedic device(s):  CPM Showering: Keep dressing dry VTE prophylaxis: Aspirin 81mg  BID  x 30 days postop , SCDs, ambulation Pain control: PRN  Follow - up plan: 2 weeks Contact information:  Margarita Rana MD, Levester Fresh PA-C  Dispo: Home with HHPT once passes PT evaluation here. Has steps to navigate at home and hasn't been able to work on that yet.      Jenne Pane, PA-C Office [phone] 12/24/2022, 4:05 PM

## 2022-12-24 NOTE — Progress Notes (Signed)
Physical Therapy Treatment Patient Details Name: [name] MRN: [id number] DOB: March 08, 1961 Today's Date: 12/24/2022   History of Present Illness 62 y.o. female admitted 12/22/22 for R TKA. PMH: MS, OSA, bipolar, anxiety, fibromyalgia, L fibula fx s/p ORIF 07/16/20, back pain, respiratory failure (on 2L O2 at night), obesity, lewy body dementia.    PT Comments    Pt tolerated increased ambulation distance of 9' with RW and min guard assist for safety, distance limited by pain and fatigue. Progress remains slow, she will need to be able to navigate stairs in order to DC home.    Recommendations for follow up therapy are one component of a multi-disciplinary discharge planning process, led by the attending physician.  Recommendations may be updated based on patient status, additional functional criteria and insurance authorization.  Follow Up Recommendations       Assistance Recommended at Discharge Intermittent Supervision/Assistance  Patient can return home with the following Assistance with cooking/housework;Assist for transportation;Help with stairs or ramp for entrance;A lot of help with bathing/dressing/bathroom;A lot of help with walking and/or transfers   Equipment Recommendations  Rolling walker (2 wheels)    Recommendations for Other Services       Precautions / Restrictions Precautions Precautions: Knee Precaution Booklet Issued: Yes (comment) Precaution Comments: reviewed no pillow under knee Restrictions Weight Bearing Restrictions: No RLE Weight Bearing: Weight bearing as tolerated     Mobility  Bed Mobility Overal bed mobility: Needs Assistance Bed Mobility: Supine to Sit     Supine to sit: Min assist, HOB elevated     General bed mobility comments: assist to rasie trunk    Transfers Overall transfer level: Needs assistance Equipment used: Rolling walker (2 wheels) Transfers: Sit to/from Stand Sit to Stand: From elevated surface, Min assist            General transfer comment: VCs hand placement, min/mod A to power up from elevated bed    Ambulation/Gait Ambulation/Gait assistance: Min assist, Min guard Gait Distance (Feet): 9 Feet Assistive device: Rolling walker (2 wheels) Gait Pattern/deviations: Step-to pattern, Decreased step length - right, Decreased step length - left Gait velocity: decr     General Gait Details: VCs sequencing, min A to steady, distance limited by pain and fatigue   Stairs             Wheelchair Mobility    Modified Rankin (Stroke Patients Only)       Balance Overall balance assessment: Needs assistance   Sitting balance-Leahy Scale: Good     Standing balance support: Bilateral upper extremity supported, During functional activity, Reliant on assistive device for balance Standing balance-Leahy Scale: Poor                              Cognition Arousal/Alertness: Awake/alert Behavior During Therapy: WFL for tasks assessed/performed Overall Cognitive Status: Within Functional Limits for tasks assessed                                          Exercises Total Joint Exercises Ankle Circles/Pumps: AROM, Both, Seated, 15 reps Quad Sets: AROM, Both, 5 reps, Supine Short Arc Quad: AAROM, Right, Supine, 10 reps Heel Slides: AAROM, Right, Supine, 10 reps Hip ABduction/ADduction: AAROM, Right, Supine, 10 reps Long Arc Quad: Right, Seated, AAROM, 10 reps Knee Flexion: AAROM, Right, Seated, 10 reps Goniometric ROM:  0-35* AAROM R knee    General Comments        Pertinent Vitals/Pain Pain Assessment Pain Score: 7  Pain Location: R knee Pain Descriptors / Indicators: Sore Pain Intervention(s): Limited activity within patient's tolerance, Monitored during session, Premedicated before session, Ice applied    Home Living                          Prior Function            PT Goals (current goals can now be found in the care plan  section) Acute Rehab PT Goals Patient Stated Goal[facility] Formulation: With patient/family Time For Goal Achievement: 12/29/22 Potential to Achieve Goals: Good Progress towards PT goals: Progressing toward goals    Frequency    7X/week      PT Plan Current plan remains appropriate    Co-evaluation              AM-PAC PT "6 Clicks" Mobility   Outcome Measure  Help needed turning from your back to your side while in a flat bed without using bedrails?: A Lot Help needed moving from lying on your back to sitting on the side of a flat bed without using bedrails?: A Lot Help needed moving to and from a bed to a chair (including a wheelchair)?: A Lot Help needed standing up from a chair using your arms (e.g., wheelchair or bedside chair)?: A Lot Help needed to walk in hospital room?: A Lot Help needed climbing 3-5 steps with a railing? : Total 6 Click Score: 11    End of Session Equipment Utilized During Treatment: Gait belt Activity Tolerance: Patient limited by fatigue;Patient limited by pain Patient left: in chair;with chair alarm set;with call bell/phone within reach;with nursing/sitter in room Nurse Communication: Mobility status PT Visit Diagnosis: Muscle weakness (generalized) (M62.81);Difficulty in walking, not elsewhere classified (R26.2);Pain Pain - Right/Left: Right Pain - part of body: Knee     Time: 1610-9604 PT Time Calculation (min) (ACUTE ONLY): 17 min  Charges:  $Gait Training: 8-22 mins                    Ralene Bathe Kistler PT 12/24/2022  Acute Rehabilitation Services  Office [phone]

## 2022-12-24 NOTE — Plan of Care (Signed)
  Problem: Pain Management: Goal: Pain level will decrease with appropriate interventions Outcome: Progressing   Problem: Education: Goal: Knowledge of General Education information will improve Description: Including pain rating scale, medication(s)/side effects and non-pharmacologic comfort measures Outcome: Progressing   Problem: Activity: Goal: Risk for activity intolerance will decrease Outcome: Progressing   

## 2022-12-24 NOTE — Progress Notes (Signed)
Physical Therapy Treatment Patient Details Name: [name] MRN: [id number] DOB: [birth year] Today's Date: 12/24/2022   History of Present Illness 62 y.o. female admitted 12/22/22 for R TKA. PMH: MS, OSA, bipolar, anxiety, fibromyalgia, L fibula fx s/p ORIF 07/16/20, back pain, respiratory failure (on 2L O2 at night), obesity, lewy body dementia.    PT Comments    Pt ambulated 5' with RW, with min A for balance, managing RW, ongoing verbal cues for sequencing, distance limited by pain and fatigue. Pt performed R TKA HEP with assistance. Good progress with mobility today, however progress remains slow and she is not near being ready to DC home, she will need to be able to navigate stairs in order to DC home.    Recommendations for follow up therapy are one component of a multi-disciplinary discharge planning process, led by the attending physician.  Recommendations may be updated based on patient status, additional functional criteria and insurance authorization.  Follow Up Recommendations       Assistance Recommended at Discharge Intermittent Supervision/Assistance  Patient can return home with the following Assistance with cooking/housework;Assist for transportation;Help with stairs or ramp for entrance;A lot of help with bathing/dressing/bathroom;A lot of help with walking and/or transfers   Equipment Recommendations  Rolling walker (2 wheels)    Recommendations for Other Services       Precautions / Restrictions Precautions Precautions: Knee Precaution Booklet Issued: Yes (comment) Precaution Comments: reviewed no pillow under knee Restrictions Weight Bearing Restrictions: No (Simultaneous filing. User may not have seen previous data.) RLE Weight Bearing: Weight bearing as tolerated (Simultaneous filing. User may not have seen previous data.)     Mobility  Bed Mobility Overal bed mobility: Needs Assistance Bed Mobility: Supine to Sit     Supine to sit: Mod assist      General bed mobility comments: assist to rasie trunk    Transfers Overall transfer level: Needs assistance Equipment used: Rolling walker (2 wheels) Transfers: Sit to/from Stand Sit to Stand: From elevated surface, Mod assist           General transfer comment: VCs hand placement, min/mod A to power up from elevated bed    Ambulation/Gait Ambulation/Gait assistance: Min assist Gait Distance (Feet): 5 Feet Assistive device: Rolling walker (2 wheels) Gait Pattern/deviations: Step-to pattern, Decreased step length - right, Decreased step length - left Gait velocity: decr     General Gait Details: VCs sequencing, min A to steady, distance limited by pain and fatigue   Stairs             Wheelchair Mobility    Modified Rankin (Stroke Patients Only)       Balance Overall balance assessment: Needs assistance   Sitting balance-Leahy Scale: Good     Standing balance support: Bilateral upper extremity supported, During functional activity, Reliant on assistive device for balance Standing balance-Leahy Scale: Poor                              Cognition Arousal/Alertness: Awake/alert Behavior During Therapy: WFL for tasks assessed/performed Overall Cognitive Status: Within Functional Limits for tasks assessed                                          Exercises Total Joint Exercises Ankle Circles/Pumps: AROM, Both, Seated, 15 reps Quad Sets: AROM, Both, 5 reps, Supine  Short Arc Quad: AAROM, Right, Supine, 10 reps Heel Slides: AAROM, Right, 5 reps, Supine Hip ABduction/ADduction: AAROM, Right, Supine, 10 reps Long Arc Quad: Right, Seated, AAROM, 10 reps Knee Flexion: AAROM, Right, Seated, 10 reps Goniometric ROM: 0-35* AAROM R knee    General Comments        Pertinent Vitals/Pain Pain Assessment Pain Score: 7  Pain Location: R knee Pain Descriptors / Indicators: Sore Pain Intervention(s): Limited activity within  patient's tolerance, Monitored during session, Premedicated before session, Ice applied    Home Living                          Prior Function            PT Goals (current goals can now be found in the care plan section) Acute Rehab PT Goals Patient Stated Goal[facility] Formulation: With patient/family Time For Goal Achievement: 12/29/22 Potential to Achieve Goals: Good Progress towards PT goals: Progressing toward goals    Frequency    7X/week      PT Plan Current plan remains appropriate    Co-evaluation              AM-PAC PT "6 Clicks" Mobility   Outcome Measure  Help needed turning from your back to your side while in a flat bed without using bedrails?: A Lot Help needed moving from lying on your back to sitting on the side of a flat bed without using bedrails?: A Lot Help needed moving to and from a bed to a chair (including a wheelchair)?: A Lot Help needed standing up from a chair using your arms (e.g., wheelchair or bedside chair)?: A Lot Help needed to walk in hospital room?: A Lot Help needed climbing 3-5 steps with a railing? : Total 6 Click Score: 11    End of Session Equipment Utilized During Treatment: Gait belt Activity Tolerance: Patient limited by fatigue;Patient limited by pain Patient left: in chair;with chair alarm set;with call bell/phone within reach;with nursing/sitter in room Nurse Communication: Mobility status PT Visit Diagnosis: Muscle weakness (generalized) (M62.81);Difficulty in walking, not elsewhere classified (R26.2);Pain Pain - Right/Left: Right Pain - part of body: Knee     Time: 0454-0981 PT Time Calculation (min) (ACUTE ONLY): 28 min  Charges:  $Gait Training: 8-22 mins $Therapeutic Exercise: 8-22 mins                     Ralene Bathe Kistler PT 12/24/2022  Acute Rehabilitation Services  Office [phone]

## 2022-12-25 MED ORDER — OXYCODONE HCL 5 MG PO TABS
10.0000 mg | ORAL_TABLET | ORAL | Status: DC | PRN
  Administered 2022-12-25 – 2022-12-26 (×5): 10 mg via ORAL
  Filled 2022-12-25 (×5): qty 2

## 2022-12-25 MED ORDER — OXYCODONE HCL 5 MG PO TABS: 5.0000 mg | ORAL_TABLET | ORAL | Status: DC | PRN

## 2022-12-25 NOTE — Discharge Summary (Signed)
Physician Discharge Summary  Patient ID: [name] MRN: [id number] DOB/AGE: March 16, 1961 62 y.o.  Admit date: 12/22/2022 Discharge date: 12/27/22  Admission Diagnoses: right knee OA  Discharge Diagnoses:  Principal Problem:   S/P total knee arthroplasty, right   Discharged Condition: fair  Hospital Course: Patient underwent a right TKA by Dr. Eulah Pont on 12/22/22 without complications. She had trouble with pain control and mobilization and spent several nights in the hospital to improve this. She is now ready for discharge home with HHPT already set up by our office.   Consults: None  Significant Diagnostic Studies: n/a  Treatments: IV hydration, antibiotics: Ancef, analgesia: acetaminophen, Dilaudid, Tramadol, and Oxycodone, anticoagulation: ASA, steroids: prednisone, therapies: PT and SW, and surgery: right TKA  Discharge Exam: Blood pressure (!) 146/74, pulse 71, temperature 98.2 F (36.8 C), temperature source Oral, resp. rate 18, height 5\' 9"  (1.753 m), weight 117 kg, SpO2 96 %. General appearance: alert, cooperative, and no distress Head: Normocephalic, without obvious abnormality, atraumatic Resp: clear to auscultation bilaterally Cardio: regular rate and rhythm Extremities: extremities normal, atraumatic, no cyanosis or edema Pulses:  L brachial 2+ R brachial 2+  L radial 2+ R radial 2+  L inguinal 2+ R inguinal 2+  L popliteal 2+ R popliteal 2+  L posterior tibial 2+ R posterior tibial 2+  L dorsalis pedis 2+ R dorsalis pedis 2+   Neurologic: Grossly normal Incision/Wound: c/d/i  Disposition: Discharge disposition: 06-Home-Health Care Svc       Discharge Instructions     CPM   Complete by: As directed    Continuous passive motion machine (CPM):      Use the CPM from 0 to 90 degrees for 4-6 hours per day.      You may break it up into 2 or 3 sessions per day.      Use CPM for 3 weeks or until you are told to stop.   Call MD / Call 911   Complete  by: As directed    If you experience chest pain or shortness of breath, CALL 911 and be transported to the hospital emergency room.  If you develope a fever above 101 F, pus (white drainage) or increased drainage or redness at the wound, or calf pain, call your surgeon's office.   Diet - low sodium heart healthy   Complete by: As directed    Discharge instructions   Complete by: As directed    You may bear weight as tolerated. Keep your dressing on and dry until follow up. Take medicine to prevent blood clots as directed. Take pain medicine as needed with the goal of transitioning to over the counter medicines.    INSTRUCTIONS AFTER JOINT REPLACEMENT   Remove items at home which could result in a fall. This includes throw rugs or furniture in walking pathways ICE to the affected joint every three hours while awake for 30 minutes at a time, for at least the first 3-5 days, and then as needed for pain and swelling.  Continue to use ice for pain and swelling. You may notice swelling that will progress down to the foot and ankle.  This is normal after surgery.  Elevate your leg when you are not up walking on it.   Continue to use the breathing machine you got in the hospital (incentive spirometer) which will help keep your temperature down.  It is common for your temperature to cycle up and down following surgery, especially at night when you are not  up moving around and exerting yourself.  The breathing machine keeps your lungs expanded and your temperature down.   DIET:  As you were doing prior to hospitalization, we recommend a well-balanced diet.  DRESSING / WOUND CARE / SHOWERING  You may shower 3 days after surgery, but keep the wounds dry during showering.  You may use an occlusive plastic wrap (Press'n Seal for example) with blue painter's tape at edges, NO SOAKING/SUBMERGING IN THE BATHTUB.  If the bandage gets wet, call the office.   ACTIVITY  Increase activity slowly as tolerated,  but follow the weight bearing instructions below.   No driving for 6 weeks or until further direction given by your physician.  You cannot drive while taking narcotics.  No lifting or carrying greater than 10 lbs. until further directed by your surgeon. Avoid periods of inactivity such as sitting longer than an hour when not asleep. This helps prevent blood clots.  You may return to work once you are authorized by your doctor.    WEIGHT BEARING   Weight bearing as tolerated with assist device (walker, cane, etc) as directed, use it as long as suggested by your surgeon or therapist, typically at least 4-6 weeks.   EXERCISES  Results after joint replacement surgery are often greatly improved when you follow the exercise, range of motion and muscle strengthening exercises prescribed by your doctor. Safety measures are also important to protect the joint from further injury. Any time any of these exercises cause you to have increased pain or swelling, decrease what you are doing until you are comfortable again and then slowly increase them. If you have problems or questions, call your caregiver or physical therapist for advice.   Rehabilitation is important following a joint replacement. After just a few days of immobilization, the muscles of the leg can become weakened and shrink (atrophy).  These exercises are designed to build up the tone and strength of the thigh and leg muscles and to improve motion. Often times heat used for twenty to thirty minutes before working out will loosen up your tissues and help with improving the range of motion but do not use heat for the first two weeks following surgery (sometimes heat can increase post-operative swelling).   These exercises can be done on a training (exercise) mat, on the floor, on a table or on a bed. Use whatever works the best and is most comfortable for you.    Use music or television while you are exercising so that the exercises are a pleasant  break in your day. This will make your life better with the exercises acting as a break in your routine that you can look forward to.   Perform all exercises about fifteen times, three times per day or as directed.  You should exercise both the operative leg and the other leg as well.  Exercises include:   Quad Sets - Tighten up the muscle on the front of the thigh (Quad) and hold for 5-10 seconds.   Straight Leg Raises - With your knee straight (if you were given a brace, keep it on), lift the leg to 60 degrees, hold for 3 seconds, and slowly lower the leg.  Perform this exercise against resistance later as your leg gets stronger.  Leg Slides: Lying on your back, slowly slide your foot toward your buttocks, bending your knee up off the floor (only go as far as is comfortable). Then slowly slide your foot back down until your leg  is flat on the floor again.  Angel Wings: Lying on your back spread your legs to the side as far apart as you can without causing discomfort.  Hamstring Strength:  Lying on your back, push your heel against the floor with your leg straight by tightening up the muscles of your buttocks.  Repeat, but this time bend your knee to a comfortable angle, and push your heel against the floor.  You may put a pillow under the heel to make it more comfortable if necessary.   A rehabilitation program following joint replacement surgery can speed recovery and prevent re-injury in the future due to weakened muscles. Contact your doctor or a physical therapist for more information on knee rehabilitation.    CONSTIPATION  Constipation is defined medically as fewer than three stools per week and severe constipation as less than one stool per week.  Even if you have a regular bowel pattern at home, your normal regimen is likely to be disrupted due to multiple reasons following surgery.  Combination of anesthesia, postoperative narcotics, change in appetite and fluid intake all can affect your  bowels.   YOU MUST use at least one of the following options; they are listed in order of increasing strength to get the job done.  They are all available over the counter, and you may need to use some, POSSIBLY even all of these options:    Drink plenty of fluids (prune juice may be helpful) and high fiber foods Colace 100 mg by mouth twice a day  Senokot for constipation as directed and as needed Dulcolax (bisacodyl), take with full glass of water  Miralax (polyethylene glycol) once or twice a day as needed.  If you have tried all these things and are unable to have a bowel movement in the first 3-4 days after surgery call either your surgeon or your primary doctor.    If you experience loose stools or diarrhea, hold the medications until you stool forms back up.  If your symptoms do not get better within 1 week or if they get worse, check with your doctor.  If you experience "the worst abdominal pain ever" or develop nausea or vomiting, please contact the office immediately for further recommendations for treatment.   ITCHING:  If you experience itching with your medications, try taking only a single pain pill, or even half a pain pill at a time.  You can also use Benadryl over the counter for itching or also to help with sleep.   TED HOSE STOCKINGS:  Use stockings on both legs until for at least 2 weeks or as directed by physician office. They may be removed at night for sleeping.  MEDICATIONS:  See your medication summary on the "After Visit Summary" that nursing will review with you.  You may have some home medications which will be placed on hold until you complete the course of blood thinner medication.  It is important for you to complete the blood thinner medication as prescribed.  Take medicines as prescribed.   You have several different medicines that work in different ways. - Tylenol is for mild to moderate pain. Try to take this medicine before turning to your narcotic medicines.   - Robaxin is for muscle spasms. This medicine can make you drowsy. - Oxycodone is a narcotic pain medicine.  Take this for severe pain. This medicine can be dehydrating / constipating. - Zofran is for nausea and vomiting.  - Aspirin is to prevent blood clots after  surgery. YOU MUST TAKE THIS MEDICINE!!  PRECAUTIONS:  If you experience chest pain or shortness of breath - call 911 immediately for transfer to the hospital emergency department.   If you develop a fever greater that 101 F, purulent drainage from wound, increased redness or drainage from wound, foul odor from the wound/dressing, or calf pain - CONTACT YOUR SURGEON.                                                   FOLLOW-UP APPOINTMENTS:  If you do not already have a post-op appointment, please call the office [phone] for an appointment to be seen by Dr. Eulah Pont in 2 weeks.   OTHER INSTRUCTIONS:   MAKE SURE YOU:  Understand these instructions.  Get help right away if you are not doing well or get worse.    Thank you for letting us be a part of your medical care team.  It is a privilege we respect greatly.  We hope these instructions will help you stay on track for a fast and full recovery!   Do not put a pillow under the knee. Place it under the heel.   Complete by: As directed    Driving restrictions   Complete by: As directed    No driving for 2-6 weeks   Post-operative opioid taper instructions:   Complete by: As directed    POST-OPERATIVE OPIOID TAPER INSTRUCTIONS: It is important to wean off of your opioid medication as soon as possible. If you do not need pain medication after your surgery it is ok to stop day one. Opioids include: Codeine, Hydrocodone(Norco, Vicodin), Oxycodone(Percocet, oxycontin) and hydromorphone amongst others.  Long term and even short term use of opiods can cause: Increased pain response Dependence Constipation Depression Respiratory depression And more.  Withdrawal symptoms can  include Flu like symptoms Nausea, vomiting And more Techniques to manage these symptoms Hydrate well Eat regular healthy meals Stay active Use relaxation techniques(deep breathing, meditating, yoga) Do Not substitute Alcohol to help with tapering If you have been on opioids for less than two weeks and do not have pain than it is ok to stop all together.  Plan to wean off of opioids This plan should start within one week post op of your joint replacement. Maintain the same interval or time between taking each dose and first decrease the dose.  Cut the total daily intake of opioids by one tablet each day Next start to increase the time between doses. The last dose that should be eliminated is the evening dose.      TED hose   Complete by: As directed    Use stockings (TED hose) for 2 weeks on right leg(s).  You may remove them at night for sleeping.   Weight bearing as tolerated   Complete by: As directed       Allergies as of 12/26/2022       Reactions   Bee Venom Anaphylaxis   Depakote [divalproex Sodium] Shortness Of Breath, Rash   Haloperidol Lactate Swelling   Hornet Venom Anaphylaxis   Latex Anaphylaxis, Rash   mild rash/itching, wheezing/sob   Peanut (diagnostic) Anaphylaxis   Peanut-containing Drug Products Anaphylaxis   Penicillins Anaphylaxis, Swelling   Per patient a "shot" gave her a "very bad reaction" with facial swelling and throat/tongue swelling. Cannot clearly recall if she  had medications to counteract reaction. RECEIVED ANCEF on 12-22-22 with NO APPARENT ISSUES   Quetiapine Anaphylaxis, Other (See Comments)   Shellfish-derived Products Anaphylaxis   Valproic Acid Swelling   Butorphanol Tartrate Other (See Comments)   Unknown reaction per patient   Amitriptyline Hcl Other (See Comments)   HALLUCINATIONS   Gabapentin Other (See Comments)   Pt reports severe back and side pain   Iodine Other (See Comments)   Flushing and fainting   Pregabalin Rash    Terbutaline Rash        Medication List     STOP taking these medications    ondansetron 4 MG tablet Commonly known as: ZOFRAN   tiZANidine 4 MG tablet Commonly known as: ZANAFLEX       TAKE these medications    acetaminophen 500 MG tablet Commonly known as: TYLENOL Take 2 tablets (1,000 mg total) by mouth every 6 (six) hours as needed for mild pain or moderate pain. What changed: reasons to take this   albuterol (2.5 MG/3ML) 0.083% nebulizer solution Commonly known as: PROVENTIL Take 3 mLs by nebulization every 4 (four) hours as needed for wheezing or shortness of breath.   albuterol 108 (90 Base) MCG/ACT inhaler Commonly known as: VENTOLIN HFA TAKE 2 PUFFS BY MOUTH EVERY 6 HOURS AS NEEDED FOR WHEEZE OR SHORTNESS OF BREATH   aspirin EC 81 MG tablet Take 1 tablet (81 mg total) by mouth 2 (two) times daily. To prevent blood clots for 30 days after surgery. What changed: additional instructions   benztropine 0.5 MG tablet Commonly known as: COGENTIN Take 0.5 mg by mouth daily.   donepezil 10 MG tablet Commonly known as: ARICEPT Take 1 tablet (10 mg total) by mouth at bedtime.   EPINEPHrine 0.3 mg/0.3 mL Soaj injection Commonly known as: EPI-PEN INJECT 0.3 MG INTO THE MUSCLE DAILY AS NEEDED FOR ANAPHYLAXIS.   FLUoxetine 40 MG capsule Commonly known as: PROZAC Take 80 mg by mouth daily.   hydrOXYzine 25 MG tablet Commonly known as: ATARAX Take 25 mg by mouth 4 (four) times daily as needed for anxiety or itching.   lamoTRIgine 200 MG tablet Commonly known as: LAMICTAL Take 200 mg by mouth at bedtime.   methocarbamol 750 MG tablet Commonly known as: Robaxin-750 Take 1 tablet (750 mg total) by mouth every 8 (eight) hours as needed for muscle spasms. DO NOT USE TIZANIDINE WHILE TAKING THIS MEDICINE!!   methylPREDNISolone 4 MG Tbpk tablet Commonly known as: MEDROL DOSEPAK Take according to package direction - 6 tabs day 1.  5 tabs day 2.  4 tabs day 3.  3  tabs day 4.  2 tabs day 5.  1 tab day 6.   metoprolol succinate 50 MG 24 hr tablet Commonly known as: TOPROL-XL Take with or immediately following a meal.   mirtazapine 15 MG tablet Commonly known as: REMERON Take 15 mg by mouth at bedtime.   multivitamin with minerals Tabs tablet Take 1 tablet by mouth daily. For nutritional supplementation.   ondansetron 4 MG disintegrating tablet Commonly known as: ZOFRAN-ODT Take 1 tablet (4 mg total) by mouth every 8 (eight) hours as needed for nausea or vomiting.   OVER THE COUNTER MEDICATION Take 1 capsule by mouth 3 (three) times daily with meals. GOLO   oxyCODONE 5 MG immediate release tablet Commonly known as: Roxicodone Take 1-2 tablets (5-10 mg total) by mouth every 4 (four) hours as needed for severe pain.   polyethylene glycol powder 17 GM/SCOOP powder Commonly  known as: GLYCOLAX/MIRALAX Take 17 g by mouth daily as needed.   topiramate 50 MG tablet Commonly known as: TOPAMAX Take 50 mg by mouth daily.   traMADol 50 MG tablet Commonly known as: ULTRAM TAKE 1 TABLET BY MOUTH EVERY DAY AT BEDTIME AS NEEDED   traZODone 100 MG tablet Commonly known as: DESYREL Take 100 mg by mouth at bedtime.   Trelegy Ellipta 100-62.5-25 MCG/ACT Aepb Generic drug: Fluticasone-Umeclidin-Vilant TAKE 1 PUFF BY MOUTH EVERY DAY   Vitamin D 50 MCG (2000 UT) Caps Take 2,000 Units by mouth daily.   Voltaren 1 % Gel Generic drug: diclofenac Sodium Apply 2 g topically daily as needed (pain).   ziprasidone 20 MG capsule Commonly known as: GEODON Take 20 mg by mouth 2 (two) times daily with a meal.               Discharge Care Instructions  (From admission, onward)           Start     Ordered   12/26/22 0000  Weight bearing as tolerated        12/26/22 1325            Follow-up Information     Sheral Apley, MD. Go on 01/06/2023.   Specialty: Orthopedic Surgery Why: your appointment is scheduled for 4:15. Contact  information: [address] [place] [address] [phone]         [facility] Follow up.   Specialty: Home Health Services Why: HHPT will provide 6 home visits prior to starting outpatient physical therapy Contact information: [address] [place] [address] [phone]         [facility], [place]. Go on 01/06/2023.   Why: your outpatient physical therapy is scheduled.they will call you with a time Contact information: [facility] [address] [place] [address] [phone]                 Signed: Marzetta Board 12/28/2022, 4:05 PM

## 2022-12-25 NOTE — Progress Notes (Signed)
Physical Therapy Treatment Patient Details Name: [name] MRN: [id number] DOB: 1960/09/25 Today's Date: 12/25/2022   History of Present Illness 62 y.o. female admitted 12/22/22 for R TKA. PMH: MS, OSA, bipolar, anxiety, fibromyalgia, L fibula fx s/p ORIF 07/16/20, back pain, respiratory failure (on 2L O2 at night), obesity, lewy body dementia.    PT Comments    Pt tolerated increased ambulation distance of 12' x 2 with RW with seated rest on toilet. Ongoing tremors noted during session, especially at end of second trial of ambulation. Pt stated she was diagnosed with parkinsons disease by a neurologist in December, but states she's not on medication for it due to interaction with other medication she's on. Pt performed HEP with assistance, increased tolerance of R knee flexion noted today (now up to ~50* AAROM).     Recommendations for follow up therapy are one component of a multi-disciplinary discharge planning process, led by the attending physician.  Recommendations may be updated based on patient status, additional functional criteria and insurance authorization.  Follow Up Recommendations       Assistance Recommended at Discharge Intermittent Supervision/Assistance  Patient can return home with the following Assistance with cooking/housework;Assist for transportation;Help with stairs or ramp for entrance;A lot of help with bathing/dressing/bathroom;A lot of help with walking and/or transfers   Equipment Recommendations  Rolling walker (2 wheels)    Recommendations for Other Services       Precautions / Restrictions Precautions Precautions: Knee Precaution Booklet Issued: Yes (comment) Precaution Comments: reviewed no pillow under knee Restrictions Weight Bearing Restrictions: No RLE Weight Bearing: Weight bearing as tolerated     Mobility  Bed Mobility Overal bed mobility: Needs Assistance Bed Mobility: Supine to Sit     Supine to sit: Min assist, HOB  elevated     General bed mobility comments: used rail and gait belt as leg lifter    Transfers Overall transfer level: Needs assistance Equipment used: Rolling walker (2 wheels) Transfers: Sit to/from Stand Sit to Stand: From elevated surface, Min guard           General transfer comment: VCs hand placement, min/guard for safety    Ambulation/Gait Ambulation/Gait assistance: Min guard Gait Distance (Feet): 12 Feet Assistive device: Rolling walker (2 wheels) Gait Pattern/deviations: Step-to pattern, Decreased step length - right, Decreased step length - left Gait velocity: decr     General Gait Details: 12' x 2 with seated rest on commode, VCs sequencing, distance limited by pain and fatigue. Pt felt she was going to lose her balance posteriorly at end of second trial of walking.   Stairs             Wheelchair Mobility    Modified Rankin (Stroke Patients Only)       Balance Overall balance assessment: Needs assistance   Sitting balance-Leahy Scale: Good     Standing balance support: Bilateral upper extremity supported, During functional activity, Reliant on assistive device for balance Standing balance-Leahy Scale: Poor                              Cognition Arousal/Alertness: Awake/alert Behavior During Therapy: WFL for tasks assessed/performed Overall Cognitive Status: Within Functional Limits for tasks assessed  Exercises Total Joint Exercises Ankle Circles/Pumps: AROM, Both, Seated, 15 reps Quad Sets: AROM, Both, Supine, 10 reps Short Arc Quad: AAROM, Right, Supine, 10 reps Heel Slides: AAROM, Right, Supine, 10 reps Hip ABduction/ADduction: AAROM, Right, Supine, 10 reps Long Arc Quad: Right, Seated, AAROM, 10 reps Knee Flexion: AAROM, Right, Seated, 10 reps Goniometric ROM: 0-50* AAROM R knee    General Comments        Pertinent Vitals/Pain Pain Assessment Pain  Score: 7  Pain Location: R knee Pain Descriptors / Indicators: Sore Pain Intervention(s): Limited activity within patient's tolerance, Monitored during session, Premedicated before session, Ice applied    Home Living                          Prior Function            PT Goals (current goals can now be found in the care plan section) Acute Rehab PT Goals Patient Stated Goal[facility] Formulation: With patient/family Time For Goal Achievement: 12/29/22 Potential to Achieve Goals: Good Progress towards PT goals: Progressing toward goals    Frequency    7X/week      PT Plan Current plan remains appropriate    Co-evaluation              AM-PAC PT "6 Clicks" Mobility   Outcome Measure  Help needed turning from your back to your side while in a flat bed without using bedrails?: A Lot Help needed moving from lying on your back to sitting on the side of a flat bed without using bedrails?: A Lot Help needed moving to and from a bed to a chair (including a wheelchair)?: A Little Help needed standing up from a chair using your arms (e.g., wheelchair or bedside chair)?: A Little Help needed to walk in hospital room?: A Little Help needed climbing 3-5 steps with a railing? : Total 6 Click Score: 14    End of Session Equipment Utilized During Treatment: Gait belt Activity Tolerance: Patient limited by fatigue;Patient limited by pain Patient left: in chair;with chair alarm set;with call bell/phone within reach Nurse Communication: Mobility status PT Visit Diagnosis: Muscle weakness (generalized) (M62.81);Difficulty in walking, not elsewhere classified (R26.2);Pain Pain - Right/Left: Right Pain - part of body: Knee     Time: 0940-1009 PT Time Calculation (min) (ACUTE ONLY): 29 min  Charges:  $Gait Training: 8-22 mins $Therapeutic Exercise: 8-22 mins                     Ralene Bathe Kistler PT 12/25/2022   Acute Rehabilitation Services  Office [phone]

## 2022-12-25 NOTE — Progress Notes (Signed)
Physical Therapy Treatment Patient Details Name: [name] MRN: [id number] DOB: October 06, 1960 Today's Date: 12/25/2022   History of Present Illness 62 y.o. female admitted 12/22/22 for R TKA. PMH: OSA, bipolar, anxiety, fibromyalgia, L fibula fx s/p ORIF 07/16/20, back pain, respiratory failure (on 2L O2 at night), obesity, lewy body dementia.    PT Comments    Pt tolerated increased ambulation distance of 18' with RW +12' with seated rest on commode. Min assist required to navigate obstacles with RW. Performed HEP with assist.    Recommendations for follow up therapy are one component of a multi-disciplinary discharge planning process, led by the attending physician.  Recommendations may be updated based on patient status, additional functional criteria and insurance authorization.  Follow Up Recommendations       Assistance Recommended at Discharge Intermittent Supervision/Assistance  Patient can return home with the following Assistance with cooking/housework;Assist for transportation;Help with stairs or ramp for entrance;A lot of help with bathing/dressing/bathroom;A little help with walking and/or transfers   Equipment Recommendations  Rolling walker (2 wheels)    Recommendations for Other Services       Precautions / Restrictions Precautions Precautions: Knee Precaution Booklet Issued: Yes (comment) Precaution Comments: reviewed no pillow under knee Restrictions Weight Bearing Restrictions: No RLE Weight Bearing: Weight bearing as tolerated     Mobility  Bed Mobility Overal bed mobility: Needs Assistance Bed Mobility: Sit to Supine     Supine to sit: Min assist, HOB elevated Sit to supine: Min assist   General bed mobility comments: min A for RLE into bed    Transfers Overall transfer level: Needs assistance Equipment used: Rolling walker (2 wheels) Transfers: Sit to/from Stand Sit to Stand: From elevated surface, Min guard           General  transfer comment: VCs hand placement, min/guard for safety    Ambulation/Gait Ambulation/Gait assistance: Min guard Gait Distance (Feet): 35 Feet Assistive device: Rolling walker (2 wheels) Gait Pattern/deviations: Step-to pattern, Decreased step length - right, Decreased step length - left Gait velocity: decr     General Gait Details: 43' + 12' with seated rest on commode, VCs sequencing, distance limited by pain and fatigue. Pt requires Assist to manage RW for negotiating obstacles.   Stairs             Wheelchair Mobility    Modified Rankin (Stroke Patients Only)       Balance Overall balance assessment: Needs assistance   Sitting balance-Leahy Scale: Good     Standing balance support: Bilateral upper extremity supported, During functional activity, Reliant on assistive device for balance Standing balance-Leahy Scale: Poor                              Cognition Arousal/Alertness: Awake/alert Behavior During Therapy: WFL for tasks assessed/performed Overall Cognitive Status: Within Functional Limits for tasks assessed                                          Exercises     General Comments        Pertinent Vitals/Pain Pain Assessment Pain Score: 9  Pain Location: R knee Pain Descriptors / Indicators: Sore Pain Intervention(s): Limited activity within patient's tolerance, Monitored during session, Premedicated before session, Ice applied    Home Living  Prior Function            PT Goals (current goals can now be found in the care plan section) Acute Rehab PT Goals Patient Stated Goal[facility] Goal Formulation: With patient Time For Goal Achievement: 12/29/22 Potential to Achieve Goals: Good Progress towards PT goals: Progressing toward goals    Frequency    7X/week      PT Plan Current plan remains appropriate    Co-evaluation               AM-PAC PT "6 Clicks" Mobility   Outcome Measure  Help needed turning from your back to your side while in a flat bed without using bedrails?: A Lot Help needed moving from lying on your back to sitting on the side of a flat bed without using bedrails?: A Lot Help needed moving to and from a bed to a chair (including a wheelchair)?: A Little Help needed standing up from a chair using your arms (e.g., wheelchair or bedside chair)?: A Little Help needed to walk in hospital room?: A Little Help needed climbing 3-5 steps with a railing? : Total 6 Click Score: 14    End of Session Equipment Utilized During Treatment: Gait belt Activity Tolerance: Patient limited by fatigue;Patient limited by pain Patient left: with call bell/phone within reach;in bed;with bed alarm set;with SCD's reapplied Nurse Communication: Mobility status PT Visit Diagnosis: Muscle weakness (generalized) (M62.81);Difficulty in walking, not elsewhere classified (R26.2);Pain Pain - Right/Left: Right Pain - part of body: Knee     Time: 1610-9604 PT Time Calculation (min) (ACUTE ONLY): 33 min  Charges:  $Gait Training: 8-22 mins $Therapeutic Exercise: 8-22 mins                     Ralene Bathe Kistler PT 12/25/2022  Acute Rehabilitation Services  Office [phone]

## 2022-12-25 NOTE — Progress Notes (Signed)
Subjective: Patient reports pain as severe but getting better finally.  Tolerating diet.  Urinating. No longer using purewick.  No CP, SOB.  Has mobilized some more OOB with PT but still limited by pain and early fatigue. After our talk yesterday it seems her anticipatory anxiety isn't holding her back as much now.  Objective:   VITALS:   Vitals:   12/24/22 2048 12/25/22 0545 12/25/22 0753 12/25/22 0755  BP: (!) 149/86 115/75    Pulse: 75 74    Resp: 18 18    Temp: 98.1 F (36.7 C) 97.6 F (36.4 C)    TempSrc: Oral Oral    SpO2: 95% 96% 95% 95%  Weight:      Height:          Latest Ref Rng & Units 12/09/2022   11:37 AM 10/14/2021    9:26 AM 08/04/2021   10:50 AM  CBC  WBC 4.0 - 10.5 K/uL 5.7  4.4  6.1   Hemoglobin 12.0 - 15.0 g/dL 09.8  11.9  14.7   Hematocrit 36.0 - 46.0 % 42.7  39.4  40.7   Platelets 150 - 400 K/uL 308  282.0  330.0       Latest Ref Rng & Units 12/09/2022   11:37 AM 10/14/2021    9:26 AM 08/04/2021   10:50 AM  BMP  Glucose 70 - 99 mg/dL 829  562  130   BUN 6 - 20 mg/dL 18  11  13    Creatinine 0.44 - 1.00 mg/dL 8.65  7.84  6.96   Sodium 135 - 145 mmol/L 137  137  138   Potassium 3.5 - 5.1 mmol/L 3.9  4.1  3.4   Chloride 98 - 111 mmol/L 103  100  99   CO2 22 - 32 mmol/L 24  26  26    Calcium 8.9 - 10.3 mg/dL 9.1  9.4  9.7    Intake/Output      06/20 0701 06/21 0700 06/21 0701 06/22 0700   P.O. 480 240   IV Piggyback 0    Total Intake(mL/kg) 480 (4.1) 240 (2.1)   Urine (mL/kg/hr) 1950 (0.7) 450 (1.1)   Stool     Total Output 1950 450   Net -1470 -210           Physical Exam: General: NAD.  Laying in bed, calm, comfortable  Resp: No increased wob Cardio: regular rate and rhythm ABD soft Neurologically intact MSK Neurovascularly intact Sensation intact distally Intact pulses distally Dorsiflexion/Plantar flexion intact R quad is INCREDIBLY TIGHT. Causing issues with knee flexion. She could BARELY get AROM to 30 degrees knee flexion for  me Incision: dressing C/D/I   Assessment: 3 Days Post-Op  S/P Procedure(s) (LRB): TOTAL KNEE ARTHROPLASTY (Right) by Dr. Jewel Baize. [place] on 12/22/22  Principal Problem:   S/P total knee arthroplasty, right   Plan: Continue to work on better pain management and mobilization. Has consistently needed Oxy 15mg  q4-6h. Will not be able to go home on that high of a dose. Will need to get down to at least only 10mg  doses  Talked at length about having a positive mindset when trying to move and not expecting herself to fail before she has even tried. Seems motivated to improve. Need to keep pushing her. May help to pre-med PT sessions with anxiety medicine to help her calm down.  Advance diet Up with therapy Incentive Spirometry Elevate and Apply ice  Weightbearing: WBAT RLE Insicional and dressing  care: Dressings left intact until follow-up and Reinforce dressings as needed Orthopedic device(s):  CPM Showering: Keep dressing dry VTE prophylaxis: Aspirin 81mg  BID  x 30 days postop , SCDs, ambulation Pain control: PRN Follow - up plan: 2 weeks Contact information:  Margarita Rana MD, Levester Fresh PA-C  Dispo: Home with HHPT once passes PT evaluation here. Has steps to navigate at home and hasn't been able to work on that yet.      Jenne Pane, PA-C Office [phone] 12/25/2022, 10:25 AM

## 2022-12-26 MED ORDER — ASPIRIN 81 MG PO TBEC: 81.0000 mg | DELAYED_RELEASE_TABLET | Freq: Two times a day (BID) | ORAL | 0 refills | Status: AC

## 2022-12-26 MED ORDER — OXYCODONE HCL 5 MG PO TABS: 5.0000 mg | ORAL_TABLET | ORAL | 0 refills | Status: AC | PRN

## 2022-12-26 MED ORDER — METHOCARBAMOL 750 MG PO TABS: 750.0000 mg | ORAL_TABLET | Freq: Three times a day (TID) | ORAL | 0 refills | Status: AC | PRN

## 2022-12-26 MED ORDER — METHYLPREDNISOLONE 4 MG PO TBPK: ORAL_TABLET | ORAL | 0 refills | Status: AC

## 2022-12-26 MED ORDER — ONDANSETRON 4 MG PO TBDP: 4.0000 mg | ORAL_TABLET | Freq: Three times a day (TID) | ORAL | 0 refills | Status: AC | PRN

## 2022-12-26 MED ORDER — ACETAMINOPHEN 500 MG PO TABS: 1000.0000 mg | ORAL_TABLET | Freq: Four times a day (QID) | ORAL | 0 refills | Status: AC | PRN

## 2022-12-26 NOTE — Progress Notes (Addendum)
Reviewed written d/c instructions w pt and her grandson, all questions answered. They both verbalized understanding. D/C via w/c w all belongings in stable condition.

## 2022-12-26 NOTE — Progress Notes (Signed)
Physical Therapy Treatment Patient Details Name: [name] MRN: [id number] DOB: 1961-05-09 Today's Date: 12/26/2022   History of Present Illness 62 y.o. female admitted 12/22/22 for R TKA. PMH: OSA, bipolar, anxiety, fibromyalgia, L fibula fx s/p ORIF 07/16/20, back pain, respiratory failure (on 2L O2 at night), obesity, lewy body dementia.    PT Comments    Pt progressing well this session. Reports pain is improved, states she has good home support and is motivate to d/c; will see again this pm for stair training in preparation for d/c home    Recommendations for follow up therapy are one component of a multi-disciplinary discharge planning process, led by the attending physician.  Recommendations may be updated based on patient status, additional functional criteria and insurance authorization.  Follow Up Recommendations       Assistance Recommended at Discharge Intermittent Supervision/Assistance  Patient can return home with the following Assistance with cooking/housework;Assist for transportation;Help with stairs or ramp for entrance;A lot of help with bathing/dressing/bathroom;A little help with walking and/or transfers   Equipment Recommendations  Rolling walker (2 wheels)    Recommendations for Other Services       Precautions / Restrictions Precautions Precautions: Knee;Fall Precaution Comments: reviewed no pillow under knee Restrictions Weight Bearing Restrictions: No RLE Weight Bearing: Weight bearing as tolerated     Mobility  Bed Mobility Overal bed mobility: Needs Assistance Bed Mobility: Supine to Sit     Supine to sit: Min guard     General bed mobility comments: for safety, incr time    Transfers Overall transfer level: Needs assistance Equipment used: Rolling walker (2 wheels) Transfers: Sit to/from Stand Sit to Stand: Min guard, From elevated surface           General transfer comment: VCs hand placement, min/guard for safety     Ambulation/Gait Ambulation/Gait assistance: Min guard Gait Distance (Feet): 25 Feet Assistive device: Rolling walker (2 wheels) Gait Pattern/deviations: Step-to pattern, Decreased step length - right, Decreased step length - left       General Gait Details: cues for sequence and RW position from self, fatigues easily   Stairs             Wheelchair Mobility    Modified Rankin (Stroke Patients Only)       Balance           Standing balance support: Bilateral upper extremity supported, During functional activity, Reliant on assistive device for balance Standing balance-Leahy Scale: Poor                              Cognition Arousal/Alertness: Awake/alert Behavior During Therapy: WFL for tasks assessed/performed Overall Cognitive Status: Within Functional Limits for tasks assessed                                          Exercises Total Joint Exercises Ankle Circles/Pumps: AROM, Both, Seated, 15 reps Heel Slides: AAROM, Right, Supine, 10 reps Goniometric ROM: grossly 10 to 60 degrees knee flexion    General Comments        Pertinent Vitals/Pain Pain Assessment Pain Assessment: 0-10 Pain Score: 4  Pain Location: R knee Pain Descriptors / Indicators: Sore Pain Intervention(s): Limited activity within patient's tolerance, Monitored during session, Premedicated before session, Repositioned    Home Living  Prior Function            PT Goals (current goals can now be found in the care plan section) Acute Rehab PT Goals PT Goal Formulation: With patient Time For Goal Achievement: 12/29/22 Potential to Achieve Goals: Good Progress towards PT goals: Progressing toward goals    Frequency    7X/week      PT Plan Current plan remains appropriate    Co-evaluation              AM-PAC PT "6 Clicks" Mobility   Outcome Measure  Help needed turning from your back to your side  while in a flat bed without using bedrails?: A Lot Help needed moving from lying on your back to sitting on the side of a flat bed without using bedrails?: A Little Help needed moving to and from a bed to a chair (including a wheelchair)?: A Little Help needed standing up from a chair using your arms (e.g., wheelchair or bedside chair)?: A Little Help needed to walk in hospital room?: A Little Help needed climbing 3-5 steps with a railing? : A Little 6 Click Score: 17    End of Session Equipment Utilized During Treatment: Gait belt Activity Tolerance: Patient limited by fatigue Patient left: in chair;with call bell/phone within reach;with family/visitor present Nurse Communication: Mobility status PT Visit Diagnosis: Muscle weakness (generalized) (M62.81);Difficulty in walking, not elsewhere classified (R26.2);Pain Pain - Right/Left: Right Pain - part of body: Knee     Time: 4098-1191 PT Time Calculation (min) (ACUTE ONLY): 18 min  Charges:  $Gait Training: 8-22 mins                     Delice Bison, PT  Acute Rehab Dept ([facility]) [phone]  12/26/2022    [facility] 12/26/2022, 12:08 PM

## 2022-12-26 NOTE — Plan of Care (Signed)
  Problem: Education: Goal: Knowledge of the prescribed therapeutic regimen will improve Outcome: Progressing   Problem: Activity: Goal: Ability to avoid complications of mobility impairment will improve Outcome: Progressing   Problem: Pain Management: Goal: Pain level will decrease with appropriate interventions Outcome: Progressing   Problem: Nutrition: Goal: Adequate nutrition will be maintained Outcome: Progressing   

## 2022-12-26 NOTE — Progress Notes (Signed)
Asst pt to walk to the BR and then placed in the recliner. Cking on pt after 30 min and pt was already back in bed. Instructed pt on pushing herself to be OOB more, reviewed complications of BR w pt. She verbalized understanding.

## 2022-12-26 NOTE — Plan of Care (Signed)

## 2022-12-26 NOTE — Progress Notes (Signed)
PT [place] NOTE   12/26/22 1200  PT Visit Information  Last PT Received On 12/26/22  Assistance Needed Pt continues to progress well, reviewed gait, stairs, transfer safety. Pt tol well. Feels ready to d/c today with family assist   History of Present Illness 62 y.o. female admitted 12/22/22 for R TKA. PMH: OSA, bipolar, anxiety, fibromyalgia, L fibula fx s/p ORIF 07/16/20, back pain, respiratory failure (on 2L O2 at night), obesity, lewy body dementia.  Precautions  Precautions Knee;Fall  Precaution Comments reviewed no pillow under knee  Restrictions  RLE Weight Bearing WBAT  Pain Assessment  Pain Assessment 0-10  Pain Score 4  Pain Location R knee  Pain Descriptors / Indicators Sore  Pain Intervention(s) Limited activity within patient's tolerance;Monitored during session;Premedicated before session;Repositioned  Cognition  Arousal/Alertness Awake/alert  Behavior During Therapy WFL for tasks assessed/performed  Overall Cognitive Status Within Functional Limits for tasks assessed  Bed Mobility  General bed mobility comments in recliner  Transfers  Overall transfer level Needs assistance  Equipment used Rolling walker (2 wheels)  Transfers Sit to/from Stand  Sit to Stand Min guard  General transfer comment VCs hand placement, min/guard for safety  Ambulation/Gait  Ambulation/Gait assistance Supervision;Min guard  Gait Distance (Feet) 40 Feet  Assistive device Rolling walker (2 wheels)  Gait Pattern/deviations Step-to pattern;Decreased step length - right;Decreased step length - left  General Gait Details cues for sequence and RW position from self, fatigues easily  Gait velocity decr  Stairs Yes  Stairs assistance Min guard  Stair Management One rail Right;One rail Left;Step to pattern;Sideways  Number of Stairs 3 (up 2/down 3)  General stair comments cues for sequence and technique, min/guard for safety, no LOB or knee buckling noted. handout reviewed with pt and son  Balance   Standing balance support Bilateral upper extremity supported;During functional activity;Reliant on assistive device for balance  Standing balance-Leahy Scale Poor  PT - End of Session  Equipment Utilized During Treatment Gait belt  Activity Tolerance Patient tolerated treatment well  Patient left in chair;with call bell/phone within reach;with family/visitor present   PT - Assessment/Plan  PT Plan Current plan remains appropriate  PT Visit Diagnosis Muscle weakness (generalized) (M62.81);Difficulty in walking, not elsewhere classified (R26.2);Pain  Pain - Right/Left Right  Pain - part of body Knee  PT Frequency (ACUTE ONLY) 7X/week  Follow Up Recommendations Follow physician's recommendations for discharge plan and follow up therapies  Assistance recommended at discharge Intermittent Supervision/Assistance  Patient can return home with the following Assistance with cooking/housework;Assist for transportation;Help with stairs or ramp for entrance;A lot of help with bathing/dressing/bathroom;A little help with walking and/or transfers  PT equipment Rolling walker (2 wheels)  AM-PAC PT "6 Clicks" Mobility Outcome Measure (Version 2)  Help needed turning from your back to your side while in a flat bed without using bedrails? 2  Help needed moving from lying on your back to sitting on the side of a flat bed without using bedrails? 2  Help needed moving to and from a bed to a chair (including a wheelchair)? 3  Help needed standing up from a chair using your arms (e.g., wheelchair or bedside chair)? 3  Help needed to walk in hospital room? 3  Help needed climbing 3-5 steps with a railing?  3  6 Click Score 16  Consider Recommendation of Discharge To: Home with [facility]  PT Goal Progression  Progress towards PT goals Progressing toward goals  Acute Rehab PT Goals  PT Goal Formulation With patient  Time For Goal Achievement 12/29/22  Potential to Achieve Goals Good  PT Time Calculation  PT Start  Time (ACUTE ONLY) 1215  PT Stop Time (ACUTE ONLY) 1237  PT Time Calculation (min) (ACUTE ONLY) 22 min  PT General Charges  $$ ACUTE PT VISIT 1 Visit  PT Treatments  $Gait Training 8-22 mins

## 2022-12-26 NOTE — Progress Notes (Signed)
Subjective: Doing better today. She was able to get up during the night and go to the bathroom. She states she did more with therapy yesterday. Motivated to work with therapy today.    Tolerating diet.  Urinating. No longer using purewick.  No CP, SOB.    Objective:   VITALS:   Vitals:   12/25/22 1301 12/25/22 2248 12/26/22 0656 12/26/22 0822  BP: 121/80 (!) 163/85 (!) 146/74   Pulse: 75 75 71   Resp: 16 18 18    Temp: 98 F (36.7 C) 98.2 F (36.8 C) 98.2 F (36.8 C)   TempSrc: Oral  Oral   SpO2: 98% 97% 95% 96%  Weight:      Height:          Latest Ref Rng & Units 12/09/2022   11:37 AM 10/14/2021    9:26 AM 08/04/2021   10:50 AM  CBC  WBC 4.0 - 10.5 K/uL 5.7  4.4  6.1   Hemoglobin 12.0 - 15.0 g/dL 16.6  06.3  01.6   Hematocrit 36.0 - 46.0 % 42.7  39.4  40.7   Platelets 150 - 400 K/uL 308  282.0  330.0       Latest Ref Rng & Units 12/09/2022   11:37 AM 10/14/2021    9:26 AM 08/04/2021   10:50 AM  BMP  Glucose 70 - 99 mg/dL 010  932  355   BUN 6 - 20 mg/dL 18  11  13    Creatinine 0.44 - 1.00 mg/dL 7.32  2.02  5.42   Sodium 135 - 145 mmol/L 137  137  138   Potassium 3.5 - 5.1 mmol/L 3.9  4.1  3.4   Chloride 98 - 111 mmol/L 103  100  99   CO2 22 - 32 mmol/L 24  26  26    Calcium 8.9 - 10.3 mg/dL 9.1  9.4  9.7    Intake/Output      06/21 0701 06/22 0700 06/22 0701 06/23 0700   P.O. 1140 360   IV Piggyback     Total Intake(mL/kg) 1140 (9.7) 360 (3.1)   Urine (mL/kg/hr) 450 (0.2)    Total Output 450    Net +690 +360        Urine Occurrence 5 x 1 x   Stool Occurrence 0 x 0 x      Physical Exam: General: NAD.  Laying in bed, calm, comfortable  Resp: No increased wob Cardio: regular rate and rhythm ABD soft Neurologically intact MSK Neurovascularly intact Sensation intact distally Intact pulses distally Dorsiflexion/Plantar flexion intact R quad remains very tight. Flexion to only about 30/40 degrees with assistance.  Incision: dressing  C/D/I   Assessment: 4 Days Post-Op  S/P Procedure(s) (LRB): TOTAL KNEE ARTHROPLASTY (Right) by Dr. Jewel Baize. [place] on 12/22/22  Principal Problem:   S/P total knee arthroplasty, right   Plan: Continue to work on better pain management and mobilization.  Will need to get down to at least only 10mg  oxycodone.  This was adjusted yesterday.   Talked at length about having a positive mindset when trying to move and not expecting herself to fail before she has even tried. Seems motivated to improve. Need to keep pushing her. May help to pre-med PT sessions with anxiety medicine to help her calm down.  Advance diet Up with therapy Incentive Spirometry Elevate and Apply ice  Weightbearing: WBAT RLE Insicional and dressing care: Dressings left intact until follow-up and Reinforce dressings as needed Orthopedic  device(s):  CPM Showering: Keep dressing dry VTE prophylaxis: Aspirin 81mg  BID  x 30 days postop , SCDs, ambulation Pain control: PRN Follow - up plan: 2 weeks Contact information:  After hours and holidays please check [email or web address] for group call information for [facility]  Dispo: Home with HHPT once passes PT evaluation here. Has steps to navigate at home and hasn't been able to work on that yet. We will see how she does with therapy today.      Vernetta Honey, PA-C Office [phone] 12/26/2022, 9:30 AM

## 2022-12-27 DIAGNOSIS — I10 Essential (primary) hypertension: Secondary | ICD-10-CM | POA: Diagnosis not present

## 2022-12-27 DIAGNOSIS — Z96651 Presence of right artificial knee joint: Secondary | ICD-10-CM | POA: Diagnosis not present

## 2022-12-27 DIAGNOSIS — G40909 Epilepsy, unspecified, not intractable, without status epilepticus: Secondary | ICD-10-CM | POA: Diagnosis not present

## 2022-12-27 DIAGNOSIS — G43119 Migraine with aura, intractable, without status migrainosus: Secondary | ICD-10-CM | POA: Diagnosis not present

## 2022-12-27 DIAGNOSIS — M4727 Other spondylosis with radiculopathy, lumbosacral region: Secondary | ICD-10-CM | POA: Diagnosis not present

## 2022-12-27 DIAGNOSIS — Z471 Aftercare following joint replacement surgery: Secondary | ICD-10-CM | POA: Diagnosis not present

## 2022-12-27 DIAGNOSIS — G3183 Dementia with Lewy bodies: Secondary | ICD-10-CM | POA: Diagnosis not present

## 2022-12-27 DIAGNOSIS — G8929 Other chronic pain: Secondary | ICD-10-CM | POA: Diagnosis not present

## 2022-12-27 DIAGNOSIS — M5117 Intervertebral disc disorders with radiculopathy, lumbosacral region: Secondary | ICD-10-CM | POA: Diagnosis not present

## 2022-12-27 NOTE — Discharge Summary (Signed)
Patient ID: [name] MRN: [id number] DOB/AGE: 03-04-1961 62 y.o.  Admit date: 12/22/2022 Discharge date: 12/26/2022  Admission Diagnoses: right knee OA  Discharge Diagnoses:  Principal Problem:   S/P total knee arthroplasty, right   Past Medical History:  Diagnosis Date   Allergic rhinitis 09/20/2008   Allergy skin test 10/10/08 Allergy Profile 01/18/2013-total IgE 242.4 with broad elevations for common inhalant allergens.    Arthritis    oa   Asthma, moderate persistent 06/04/2009   Allergy Profile 01/18/2013-total IgE 242.4 with broad elevations for common inhalant allergens. PFT 07/05/08- FVC 3.51/ 88%, FEV1 2.38/ 78%, R 0.68, 25-75% 1.45/ 44%, TLC 83%, DLCO 68% PFT 10/21/20- Moderate obstructive airways disease with slight respnse to bronchodilator. Normal Diffusion. FEV!/FVC 0.75    Bipolar I disorder 06/13/2008   Chronic back pain 06/13/2008   Chronic respiratory failure with hypoxia 2010   no longer need home O2. stable since 2022   Class 3 severe obesity due to excess calories with serious comorbidity and body mass index (BMI) of 40.0 to 44.9 in adult 12/19/2018   Closed left ankle fracture    DDD (degenerative disc disease), lumbosacral 10/03/2013   Elevated LFTs 10/15/2010   Female pelvic peritoneal adhesions 12/19/2018   Fibromyalgia    Galactorrhea not associated with childbirth 12/19/2018   GERD (gastroesophageal reflux disease) 07/05/2008   Hiatal hernia 11/03/2013   Hyperglycemia 06/13/2008   Hypertension    Intractable migraine with aura 11/03/2013   LBD (Lewy body dementia) (HCC)    Has memory lapse when under stress   Lumbar radiculopathy 08/30/2012   Lumbosacral spondylosis 11/03/2013   Mixed hyperlipidemia 10/04/2019   Oropharyngeal dysphagia 03/01/2018   " certain foods are hard to swollow"   OSA (obstructive sleep apnea) 06/13/2008   mild no C-PAP. home O2 PRN   PONV (postoperative nausea and vomiting)    Schizoaffective disorder, bipolar  type 10/04/2019   Seizure disorder    last seizure 2014   Thyroid disease 10/03/2013   has nodule Lt thyroid   Vitamin D deficiency 10/04/2019   Vocal cord disorder 04/02/2009   hoarse voice due to thyroid. no problems with intubation     Procedures Performed: Right total knee arthroplasty   Discharged Condition: stable  Hospital Course: Patient brought in to [facility] for scheduled procedure. She T=tolerated procedure well.  Was kept for monitoring overnight for pain control and medical monitoring postop. She had trouble with pain control and mobilization and spent several nights in the hospital to improve this. She was ready for discharge home with HHPT already set up by our office on 12/26/22. Patient was instructed on specific activity restrictions and all questions were answered.   Consults: None  Significant Diagnostic Studies: No additional pertinent studies  Treatments: Surgery  Discharge Exam: General: NAD.  Laying in bed, calm, comfortable  Resp: No increased wob Cardio: regular rate and rhythm ABD soft Neurologically intact MSK Neurovascularly intact Sensation intact distally Intact pulses distally Dorsiflexion/Plantar flexion intact R quad remains very tight. Flexion to only about 30/40 degrees with assistance.  Incision: dressing C/D/I  Disposition: Discharge disposition: 06-Home-Health Care Svc       Discharge Instructions     CPM   Complete by: As directed    Continuous passive motion machine (CPM):      Use the CPM from 0 to 90 degrees for 4-6 hours per day.      You may break it up into 2 or 3 sessions per day.  Use CPM for 3 weeks or until you are told to stop.   Call MD / Call 911   Complete by: As directed    If you experience chest pain or shortness of breath, CALL 911 and be transported to the hospital emergency room.  If you develope a fever above 101 F, pus (white drainage) or increased drainage or redness at the wound, or  calf pain, call your surgeon's office.   Diet - low sodium heart healthy   Complete by: As directed    Discharge instructions   Complete by: As directed    You may bear weight as tolerated. Keep your dressing on and dry until follow up. Take medicine to prevent blood clots as directed. Take pain medicine as needed with the goal of transitioning to over the counter medicines.    INSTRUCTIONS AFTER JOINT REPLACEMENT   Remove items at home which could result in a fall. This includes throw rugs or furniture in walking pathways ICE to the affected joint every three hours while awake for 30 minutes at a time, for at least the first 3-5 days, and then as needed for pain and swelling.  Continue to use ice for pain and swelling. You may notice swelling that will progress down to the foot and ankle.  This is normal after surgery.  Elevate your leg when you are not up walking on it.   Continue to use the breathing machine you got in the hospital (incentive spirometer) which will help keep your temperature down.  It is common for your temperature to cycle up and down following surgery, especially at night when you are not up moving around and exerting yourself.  The breathing machine keeps your lungs expanded and your temperature down.   DIET:  As you were doing prior to hospitalization, we recommend a well-balanced diet.  DRESSING / WOUND CARE / SHOWERING  You may shower 3 days after surgery, but keep the wounds dry during showering.  You may use an occlusive plastic wrap (Press'n Seal for example) with blue painter's tape at edges, NO SOAKING/SUBMERGING IN THE BATHTUB.  If the bandage gets wet, call the office.   ACTIVITY  Increase activity slowly as tolerated, but follow the weight bearing instructions below.   No driving for 6 weeks or until further direction given by your physician.  You cannot drive while taking narcotics.  No lifting or carrying greater than 10 lbs. until further directed by  your surgeon. Avoid periods of inactivity such as sitting longer than an hour when not asleep. This helps prevent blood clots.  You may return to work once you are authorized by your doctor.    WEIGHT BEARING   Weight bearing as tolerated with assist device (walker, cane, etc) as directed, use it as long as suggested by your surgeon or therapist, typically at least 4-6 weeks.   EXERCISES  Results after joint replacement surgery are often greatly improved when you follow the exercise, range of motion and muscle strengthening exercises prescribed by your doctor. Safety measures are also important to protect the joint from further injury. Any time any of these exercises cause you to have increased pain or swelling, decrease what you are doing until you are comfortable again and then slowly increase them. If you have problems or questions, call your caregiver or physical therapist for advice.   Rehabilitation is important following a joint replacement. After just a few days of immobilization, the muscles of the leg can become weakened  and shrink (atrophy).  These exercises are designed to build up the tone and strength of the thigh and leg muscles and to improve motion. Often times heat used for twenty to thirty minutes before working out will loosen up your tissues and help with improving the range of motion but do not use heat for the first two weeks following surgery (sometimes heat can increase post-operative swelling).   These exercises can be done on a training (exercise) mat, on the floor, on a table or on a bed. Use whatever works the best and is most comfortable for you.    Use music or television while you are exercising so that the exercises are a pleasant break in your day. This will make your life better with the exercises acting as a break in your routine that you can look forward to.   Perform all exercises about fifteen times, three times per day or as directed.  You should exercise both  the operative leg and the other leg as well.  Exercises include:   Quad Sets - Tighten up the muscle on the front of the thigh (Quad) and hold for 5-10 seconds.   Straight Leg Raises - With your knee straight (if you were given a brace, keep it on), lift the leg to 60 degrees, hold for 3 seconds, and slowly lower the leg.  Perform this exercise against resistance later as your leg gets stronger.  Leg Slides: Lying on your back, slowly slide your foot toward your buttocks, bending your knee up off the floor (only go as far as is comfortable). Then slowly slide your foot back down until your leg is flat on the floor again.  Angel Wings: Lying on your back spread your legs to the side as far apart as you can without causing discomfort.  Hamstring Strength:  Lying on your back, push your heel against the floor with your leg straight by tightening up the muscles of your buttocks.  Repeat, but this time bend your knee to a comfortable angle, and push your heel against the floor.  You may put a pillow under the heel to make it more comfortable if necessary.   A rehabilitation program following joint replacement surgery can speed recovery and prevent re-injury in the future due to weakened muscles. Contact your doctor or a physical therapist for more information on knee rehabilitation.    CONSTIPATION  Constipation is defined medically as fewer than three stools per week and severe constipation as less than one stool per week.  Even if you have a regular bowel pattern at home, your normal regimen is likely to be disrupted due to multiple reasons following surgery.  Combination of anesthesia, postoperative narcotics, change in appetite and fluid intake all can affect your bowels.   YOU MUST use at least one of the following options; they are listed in order of increasing strength to get the job done.  They are all available over the counter, and you may need to use some, POSSIBLY even all of these options:     Drink plenty of fluids (prune juice may be helpful) and high fiber foods Colace 100 mg by mouth twice a day  Senokot for constipation as directed and as needed Dulcolax (bisacodyl), take with full glass of water  Miralax (polyethylene glycol) once or twice a day as needed.  If you have tried all these things and are unable to have a bowel movement in the first 3-4 days after surgery call either your  surgeon or your primary doctor.    If you experience loose stools or diarrhea, hold the medications until you stool forms back up.  If your symptoms do not get better within 1 week or if they get worse, check with your doctor.  If you experience "the worst abdominal pain ever" or develop nausea or vomiting, please contact the office immediately for further recommendations for treatment.   ITCHING:  If you experience itching with your medications, try taking only a single pain pill, or even half a pain pill at a time.  You can also use Benadryl over the counter for itching or also to help with sleep.   TED HOSE STOCKINGS:  Use stockings on both legs until for at least 2 weeks or as directed by physician office. They may be removed at night for sleeping.  MEDICATIONS:  See your medication summary on the "After Visit Summary" that nursing will review with you.  You may have some home medications which will be placed on hold until you complete the course of blood thinner medication.  It is important for you to complete the blood thinner medication as prescribed.  Take medicines as prescribed.   You have several different medicines that work in different ways. - Tylenol is for mild to moderate pain. Try to take this medicine before turning to your narcotic medicines.  - Robaxin is for muscle spasms. This medicine can make you drowsy. - Oxycodone is a narcotic pain medicine.  Take this for severe pain. This medicine can be dehydrating / constipating. - Zofran is for nausea and vomiting.  - Aspirin is  to prevent blood clots after surgery. YOU MUST TAKE THIS MEDICINE!!  PRECAUTIONS:  If you experience chest pain or shortness of breath - call 911 immediately for transfer to the hospital emergency department.   If you develop a fever greater that 101 F, purulent drainage from wound, increased redness or drainage from wound, foul odor from the wound/dressing, or calf pain - CONTACT YOUR SURGEON.                                                   FOLLOW-UP APPOINTMENTS:  If you do not already have a post-op appointment, please call the office [phone] for an appointment to be seen by Dr. Eulah Pont in 2 weeks.   OTHER INSTRUCTIONS:   MAKE SURE YOU:  Understand these instructions.  Get help right away if you are not doing well or get worse.    Thank you for letting us be a part of your medical care team.  It is a privilege we respect greatly.  We hope these instructions will help you stay on track for a fast and full recovery!   Do not put a pillow under the knee. Place it under the heel.   Complete by: As directed    Driving restrictions   Complete by: As directed    No driving for 2-6 weeks   Post-operative opioid taper instructions:   Complete by: As directed    POST-OPERATIVE OPIOID TAPER INSTRUCTIONS: It is important to wean off of your opioid medication as soon as possible. If you do not need pain medication after your surgery it is ok to stop day one. Opioids include: Codeine, Hydrocodone(Norco, Vicodin), Oxycodone(Percocet, oxycontin) and hydromorphone amongst others.  Long term and even short term  use of opiods can cause: Increased pain response Dependence Constipation Depression Respiratory depression And more.  Withdrawal symptoms can include Flu like symptoms Nausea, vomiting And more Techniques to manage these symptoms Hydrate well Eat regular healthy meals Stay active Use relaxation techniques(deep breathing, meditating, yoga) Do Not substitute Alcohol to help  with tapering If you have been on opioids for less than two weeks and do not have pain than it is ok to stop all together.  Plan to wean off of opioids This plan should start within one week post op of your joint replacement. Maintain the same interval or time between taking each dose and first decrease the dose.  Cut the total daily intake of opioids by one tablet each day Next start to increase the time between doses. The last dose that should be eliminated is the evening dose.      TED hose   Complete by: As directed    Use stockings (TED hose) for 2 weeks on right leg(s).  You may remove them at night for sleeping.   Weight bearing as tolerated   Complete by: As directed       Allergies as of 12/26/2022       Reactions   Bee Venom Anaphylaxis   Depakote [divalproex Sodium] Shortness Of Breath, Rash   Haloperidol Lactate Swelling   Hornet Venom Anaphylaxis   Latex Anaphylaxis, Rash   mild rash/itching, wheezing/sob   Peanut (diagnostic) Anaphylaxis   Peanut-containing Drug Products Anaphylaxis   Penicillins Anaphylaxis, Swelling   Per patient a "shot" gave her a "very bad reaction" with facial swelling and throat/tongue swelling. Cannot clearly recall if she had medications to counteract reaction. RECEIVED ANCEF on 12-22-22 with NO APPARENT ISSUES   Quetiapine Anaphylaxis, Other (See Comments)   Shellfish-derived Products Anaphylaxis   Valproic Acid Swelling   Butorphanol Tartrate Other (See Comments)   Unknown reaction per patient   Amitriptyline Hcl Other (See Comments)   HALLUCINATIONS   Gabapentin Other (See Comments)   Pt reports severe back and side pain   Iodine Other (See Comments)   Flushing and fainting   Pregabalin Rash   Terbutaline Rash        Medication List     STOP taking these medications    ondansetron 4 MG tablet Commonly known as: ZOFRAN   tiZANidine 4 MG tablet Commonly known as: ZANAFLEX       TAKE these medications     acetaminophen 500 MG tablet Commonly known as: TYLENOL Take 2 tablets (1,000 mg total) by mouth every 6 (six) hours as needed for mild pain or moderate pain. What changed: reasons to take this   albuterol (2.5 MG/3ML) 0.083% nebulizer solution Commonly known as: PROVENTIL Take 3 mLs by nebulization every 4 (four) hours as needed for wheezing or shortness of breath.   albuterol 108 (90 Base) MCG/ACT inhaler Commonly known as: VENTOLIN HFA TAKE 2 PUFFS BY MOUTH EVERY 6 HOURS AS NEEDED FOR WHEEZE OR SHORTNESS OF BREATH   aspirin EC 81 MG tablet Take 1 tablet (81 mg total) by mouth 2 (two) times daily. To prevent blood clots for 30 days after surgery. What changed: additional instructions   benztropine 0.5 MG tablet Commonly known as: COGENTIN Take 0.5 mg by mouth daily.   donepezil 10 MG tablet Commonly known as: ARICEPT Take 1 tablet (10 mg total) by mouth at bedtime.   EPINEPHrine 0.3 mg/0.3 mL Soaj injection Commonly known as: EPI-PEN INJECT 0.3 MG INTO THE MUSCLE DAILY  AS NEEDED FOR ANAPHYLAXIS.   FLUoxetine 40 MG capsule Commonly known as: PROZAC Take 80 mg by mouth daily.   hydrOXYzine 25 MG tablet Commonly known as: ATARAX Take 25 mg by mouth 4 (four) times daily as needed for anxiety or itching.   lamoTRIgine 200 MG tablet Commonly known as: LAMICTAL Take 200 mg by mouth at bedtime.   methocarbamol 750 MG tablet Commonly known as: Robaxin-750 Take 1 tablet (750 mg total) by mouth every 8 (eight) hours as needed for muscle spasms. DO NOT USE TIZANIDINE WHILE TAKING THIS MEDICINE!!   methylPREDNISolone 4 MG Tbpk tablet Commonly known as: MEDROL DOSEPAK Take according to package direction - 6 tabs day 1.  5 tabs day 2.  4 tabs day 3.  3 tabs day 4.  2 tabs day 5.  1 tab day 6.   metoprolol succinate 50 MG 24 hr tablet Commonly known as: TOPROL-XL Take with or immediately following a meal.   mirtazapine 15 MG tablet Commonly known as: REMERON Take 15 mg by  mouth at bedtime.   multivitamin with minerals Tabs tablet Take 1 tablet by mouth daily. For nutritional supplementation.   ondansetron 4 MG disintegrating tablet Commonly known as: ZOFRAN-ODT Take 1 tablet (4 mg total) by mouth every 8 (eight) hours as needed for nausea or vomiting.   OVER THE COUNTER MEDICATION Take 1 capsule by mouth 3 (three) times daily with meals. GOLO   oxyCODONE 5 MG immediate release tablet Commonly known as: Roxicodone Take 1-2 tablets (5-10 mg total) by mouth every 4 (four) hours as needed for severe pain.   polyethylene glycol powder 17 GM/SCOOP powder Commonly known as: GLYCOLAX/MIRALAX Take 17 g by mouth daily as needed.   topiramate 50 MG tablet Commonly known as: TOPAMAX Take 50 mg by mouth daily.   traMADol 50 MG tablet Commonly known as: ULTRAM TAKE 1 TABLET BY MOUTH EVERY DAY AT BEDTIME AS NEEDED   traZODone 100 MG tablet Commonly known as: DESYREL Take 100 mg by mouth at bedtime.   Trelegy Ellipta 100-62.5-25 MCG/ACT Aepb Generic drug: Fluticasone-Umeclidin-Vilant TAKE 1 PUFF BY MOUTH EVERY DAY   Vitamin D 50 MCG (2000 UT) Caps Take 2,000 Units by mouth daily.   Voltaren 1 % Gel Generic drug: diclofenac Sodium Apply 2 g topically daily as needed (pain).   ziprasidone 20 MG capsule Commonly known as: GEODON Take 20 mg by mouth 2 (two) times daily with a meal.               Discharge Care Instructions  (From admission, onward)           Start     Ordered   12/26/22 0000  Weight bearing as tolerated        12/26/22 1325            Follow-up Information     Sheral Apley, MD. Go on 01/06/2023.   Specialty: Orthopedic Surgery Why: your appointment is scheduled for 4:15. Contact information: [address] [place] [address] [phone]         [facility] Follow up.   Specialty: Home Health Services Why: HHPT will provide 6 home visits prior to starting  outpatient physical therapy Contact information: [address] [place] [address] [phone]         [facility] [place]. Go on 01/06/2023.   Why: your outpatient physical therapy is scheduled.they will call you with a time Contact information:  [facility] [address] [place] [address] [phone]                 Alfonse Alpers, PA-C

## 2022-12-28 ENCOUNTER — Telehealth: Payer: Self-pay

## 2022-12-28 NOTE — Transitions of Care (Post Inpatient/ED Visit) (Signed)
   12/28/2022  Name: Aastha Dayley MRN: [id number] DOB: [birth year]  Today's TOC FU Call Status: Today's TOC FU Call Status:: Unsuccessul Call (1st Attempt) Unsuccessful Call (1st Attempt) Date: 12/28/22  Attempted to reach the patient regarding the most recent Inpatient/ED visit.  Follow Up Plan: Additional outreach attempts will be made to reach the patient to complete the Transitions of Care (Post Inpatient/ED visit) call.     Antionette Fairy, RN,BSN,CCM [facility] Care Management Care Management Community Coordinator Direct Phone: [phone] Toll Free: [phone] Fax: [phone]

## 2022-12-29 ENCOUNTER — Telehealth: Payer: Self-pay

## 2022-12-29 DIAGNOSIS — I10 Essential (primary) hypertension: Secondary | ICD-10-CM | POA: Diagnosis not present

## 2022-12-29 DIAGNOSIS — M5117 Intervertebral disc disorders with radiculopathy, lumbosacral region: Secondary | ICD-10-CM | POA: Diagnosis not present

## 2022-12-29 DIAGNOSIS — G3183 Dementia with Lewy bodies: Secondary | ICD-10-CM | POA: Diagnosis not present

## 2022-12-29 DIAGNOSIS — Z96651 Presence of right artificial knee joint: Secondary | ICD-10-CM | POA: Diagnosis not present

## 2022-12-29 DIAGNOSIS — G8929 Other chronic pain: Secondary | ICD-10-CM | POA: Diagnosis not present

## 2022-12-29 DIAGNOSIS — M4727 Other spondylosis with radiculopathy, lumbosacral region: Secondary | ICD-10-CM | POA: Diagnosis not present

## 2022-12-29 DIAGNOSIS — Z471 Aftercare following joint replacement surgery: Secondary | ICD-10-CM | POA: Diagnosis not present

## 2022-12-29 DIAGNOSIS — G43119 Migraine with aura, intractable, without status migrainosus: Secondary | ICD-10-CM | POA: Diagnosis not present

## 2022-12-29 DIAGNOSIS — G40909 Epilepsy, unspecified, not intractable, without status epilepticus: Secondary | ICD-10-CM | POA: Diagnosis not present

## 2022-12-29 NOTE — Transitions of Care (Post Inpatient/ED Visit) (Signed)
   12/29/2022  Name: [name] MRN: [id number] DOB: 21-Jul-1960  Today's TOC FU Call Status: Today's TOC FU Call Status:: Unsuccessful Call (2nd Attempt) Unsuccessful Call (2nd Attempt) Date: 12/29/22  Attempted to reach the patient regarding the most recent Inpatient/ED visit.  Follow Up Plan: Additional outreach attempts will be made to reach the patient to complete the Transitions of Care (Post Inpatient/ED visit) call.   Jodelle Gross, RN, BSN, CCM Care Management Coordinator [facility] Phone: [phone]

## 2022-12-30 ENCOUNTER — Telehealth: Payer: Self-pay

## 2022-12-30 NOTE — Transitions of Care (Post Inpatient/ED Visit) (Signed)
   12/30/2022  Name: [name] MRN: [id number] DOB: 1961-02-18  Today's TOC FU Call Status: Today's TOC FU Call Status:: Unsuccessful Call (3rd Attempt) Unsuccessful Call (3rd Attempt) Date: 12/30/22  Attempted to reach the patient regarding the most recent Inpatient/ED visit.  Follow Up Plan: No further outreach attempts will be made at this time. We have been unable to contact the patient.    Antionette Fairy, RN,BSN,CCM [facility] Care Management Care Management Community Coordinator Direct Phone: [phone] Toll Free: [phone] Fax: [phone]

## 2022-12-31 ENCOUNTER — Ambulatory Visit: Payer: Medicare Other | Admitting: Internal Medicine

## 2023-01-01 DIAGNOSIS — G8929 Other chronic pain: Secondary | ICD-10-CM | POA: Diagnosis not present

## 2023-01-01 DIAGNOSIS — Z96651 Presence of right artificial knee joint: Secondary | ICD-10-CM | POA: Diagnosis not present

## 2023-01-01 DIAGNOSIS — M4727 Other spondylosis with radiculopathy, lumbosacral region: Secondary | ICD-10-CM | POA: Diagnosis not present

## 2023-01-01 DIAGNOSIS — G3183 Dementia with Lewy bodies: Secondary | ICD-10-CM | POA: Diagnosis not present

## 2023-01-01 DIAGNOSIS — G40909 Epilepsy, unspecified, not intractable, without status epilepticus: Secondary | ICD-10-CM | POA: Diagnosis not present

## 2023-01-01 DIAGNOSIS — I10 Essential (primary) hypertension: Secondary | ICD-10-CM | POA: Diagnosis not present

## 2023-01-01 DIAGNOSIS — G43119 Migraine with aura, intractable, without status migrainosus: Secondary | ICD-10-CM | POA: Diagnosis not present

## 2023-01-01 DIAGNOSIS — Z471 Aftercare following joint replacement surgery: Secondary | ICD-10-CM | POA: Diagnosis not present

## 2023-01-01 DIAGNOSIS — M5117 Intervertebral disc disorders with radiculopathy, lumbosacral region: Secondary | ICD-10-CM | POA: Diagnosis not present

## 2023-01-02 DIAGNOSIS — G40909 Epilepsy, unspecified, not intractable, without status epilepticus: Secondary | ICD-10-CM | POA: Diagnosis not present

## 2023-01-02 DIAGNOSIS — I10 Essential (primary) hypertension: Secondary | ICD-10-CM | POA: Diagnosis not present

## 2023-01-02 DIAGNOSIS — Z471 Aftercare following joint replacement surgery: Secondary | ICD-10-CM | POA: Diagnosis not present

## 2023-01-02 DIAGNOSIS — G43119 Migraine with aura, intractable, without status migrainosus: Secondary | ICD-10-CM | POA: Diagnosis not present

## 2023-01-02 DIAGNOSIS — G3183 Dementia with Lewy bodies: Secondary | ICD-10-CM | POA: Diagnosis not present

## 2023-01-02 DIAGNOSIS — G8929 Other chronic pain: Secondary | ICD-10-CM | POA: Diagnosis not present

## 2023-01-02 DIAGNOSIS — M5117 Intervertebral disc disorders with radiculopathy, lumbosacral region: Secondary | ICD-10-CM | POA: Diagnosis not present

## 2023-01-02 DIAGNOSIS — Z96651 Presence of right artificial knee joint: Secondary | ICD-10-CM | POA: Diagnosis not present

## 2023-01-02 DIAGNOSIS — M4727 Other spondylosis with radiculopathy, lumbosacral region: Secondary | ICD-10-CM | POA: Diagnosis not present

## 2023-01-03 ENCOUNTER — Other Ambulatory Visit: Payer: Self-pay | Admitting: Family Medicine

## 2023-01-03 DIAGNOSIS — I10 Essential (primary) hypertension: Secondary | ICD-10-CM

## 2023-01-04 DIAGNOSIS — Z471 Aftercare following joint replacement surgery: Secondary | ICD-10-CM | POA: Diagnosis not present

## 2023-01-04 DIAGNOSIS — G8929 Other chronic pain: Secondary | ICD-10-CM | POA: Diagnosis not present

## 2023-01-04 DIAGNOSIS — G40909 Epilepsy, unspecified, not intractable, without status epilepticus: Secondary | ICD-10-CM | POA: Diagnosis not present

## 2023-01-04 DIAGNOSIS — Z96651 Presence of right artificial knee joint: Secondary | ICD-10-CM | POA: Diagnosis not present

## 2023-01-04 DIAGNOSIS — I10 Essential (primary) hypertension: Secondary | ICD-10-CM | POA: Diagnosis not present

## 2023-01-04 DIAGNOSIS — M5117 Intervertebral disc disorders with radiculopathy, lumbosacral region: Secondary | ICD-10-CM | POA: Diagnosis not present

## 2023-01-04 DIAGNOSIS — M4727 Other spondylosis with radiculopathy, lumbosacral region: Secondary | ICD-10-CM | POA: Diagnosis not present

## 2023-01-04 DIAGNOSIS — G43119 Migraine with aura, intractable, without status migrainosus: Secondary | ICD-10-CM | POA: Diagnosis not present

## 2023-01-04 DIAGNOSIS — G3183 Dementia with Lewy bodies: Secondary | ICD-10-CM | POA: Diagnosis not present

## 2023-01-05 DIAGNOSIS — Z471 Aftercare following joint replacement surgery: Secondary | ICD-10-CM | POA: Diagnosis not present

## 2023-01-05 DIAGNOSIS — G40909 Epilepsy, unspecified, not intractable, without status epilepticus: Secondary | ICD-10-CM | POA: Diagnosis not present

## 2023-01-05 DIAGNOSIS — G3183 Dementia with Lewy bodies: Secondary | ICD-10-CM | POA: Diagnosis not present

## 2023-01-05 DIAGNOSIS — Z96651 Presence of right artificial knee joint: Secondary | ICD-10-CM | POA: Diagnosis not present

## 2023-01-05 DIAGNOSIS — G43119 Migraine with aura, intractable, without status migrainosus: Secondary | ICD-10-CM | POA: Diagnosis not present

## 2023-01-05 DIAGNOSIS — I10 Essential (primary) hypertension: Secondary | ICD-10-CM | POA: Diagnosis not present

## 2023-01-05 DIAGNOSIS — G8929 Other chronic pain: Secondary | ICD-10-CM | POA: Diagnosis not present

## 2023-01-05 DIAGNOSIS — M4727 Other spondylosis with radiculopathy, lumbosacral region: Secondary | ICD-10-CM | POA: Diagnosis not present

## 2023-01-05 DIAGNOSIS — M5117 Intervertebral disc disorders with radiculopathy, lumbosacral region: Secondary | ICD-10-CM | POA: Diagnosis not present

## 2023-01-06 DIAGNOSIS — M1711 Unilateral primary osteoarthritis, right knee: Secondary | ICD-10-CM | POA: Diagnosis not present

## 2023-01-08 DIAGNOSIS — M6281 Muscle weakness (generalized): Secondary | ICD-10-CM | POA: Diagnosis not present

## 2023-01-08 DIAGNOSIS — M25661 Stiffness of right knee, not elsewhere classified: Secondary | ICD-10-CM | POA: Diagnosis not present

## 2023-01-08 DIAGNOSIS — R262 Difficulty in walking, not elsewhere classified: Secondary | ICD-10-CM | POA: Diagnosis not present

## 2023-01-08 DIAGNOSIS — M1711 Unilateral primary osteoarthritis, right knee: Secondary | ICD-10-CM | POA: Diagnosis not present

## 2023-01-12 DIAGNOSIS — M25661 Stiffness of right knee, not elsewhere classified: Secondary | ICD-10-CM | POA: Diagnosis not present

## 2023-01-12 DIAGNOSIS — R262 Difficulty in walking, not elsewhere classified: Secondary | ICD-10-CM | POA: Diagnosis not present

## 2023-01-12 DIAGNOSIS — M1711 Unilateral primary osteoarthritis, right knee: Secondary | ICD-10-CM | POA: Diagnosis not present

## 2023-01-12 DIAGNOSIS — M6281 Muscle weakness (generalized): Secondary | ICD-10-CM | POA: Diagnosis not present

## 2023-01-13 ENCOUNTER — Encounter: Payer: Self-pay | Admitting: Family Medicine

## 2023-01-13 ENCOUNTER — Ambulatory Visit (INDEPENDENT_AMBULATORY_CARE_PROVIDER_SITE_OTHER): Payer: Medicare HMO | Admitting: Family Medicine

## 2023-01-13 VITALS — BP 124/80 | HR 67 | Temp 98.2°F | Wt 265.8 lb

## 2023-01-13 DIAGNOSIS — I10 Essential (primary) hypertension: Secondary | ICD-10-CM | POA: Diagnosis not present

## 2023-01-13 DIAGNOSIS — F250 Schizoaffective disorder, bipolar type: Secondary | ICD-10-CM

## 2023-01-13 DIAGNOSIS — F02A2 Dementia in other diseases classified elsewhere, mild, with psychotic disturbance: Secondary | ICD-10-CM | POA: Diagnosis not present

## 2023-01-13 DIAGNOSIS — J4540 Moderate persistent asthma, uncomplicated: Secondary | ICD-10-CM | POA: Diagnosis not present

## 2023-01-13 DIAGNOSIS — Z96651 Presence of right artificial knee joint: Secondary | ICD-10-CM | POA: Diagnosis not present

## 2023-01-13 DIAGNOSIS — G3183 Dementia with Lewy bodies: Secondary | ICD-10-CM | POA: Diagnosis not present

## 2023-01-13 NOTE — Progress Notes (Signed)
Established Patient Office Visit   Subjective  Patient ID: [name], female    DOB: 03/29/1961  Age: 62 y.o. MRN: [id number]  Chief Complaint  Patient presents with   Follow-up    Patient is a 62 year old female with pmh sig for Lewy body dementia, moderate persistent asthma, chronic back pain, schizoaffective disorder bipolar type, seasonal allergies, obesity, lumbosacral spondylolysis, left thyroid nodule, fibromyalgia, DDD,, chronic back pain, HTN, HLD, hiatal hernia, GERD, numerous medication allergies who seen for follow-up.  Patient status post right TKR on 12/22/2022 complicated by pain and hypertension immediately after surgery causing patient to be admitted x 5 days.  Patient exercising and doing PT.  Ambulating with walker.  Moving to [place], [place] with her daughter at the end of the month.  Will have 6 wks of PT in [place].    Past Medical History:  Diagnosis Date   Allergic rhinitis 09/20/2008   Allergy skin test 10/10/08 Allergy Profile 01/18/2013-total IgE 242.4 with broad elevations for common inhalant allergens.    Arthritis    oa   Asthma, moderate persistent 06/04/2009   Allergy Profile 01/18/2013-total IgE 242.4 with broad elevations for common inhalant allergens. PFT 07/05/08- FVC 3.51/ 88%, FEV1 2.38/ 78%, R 0.68, 25-75% 1.45/ 44%, TLC 83%, DLCO 68% PFT 10/21/20- Moderate obstructive airways disease with slight respnse to bronchodilator. Normal Diffusion. FEV!/FVC 0.75    Bipolar I disorder 06/13/2008   Chronic back pain 06/13/2008   Chronic respiratory failure with hypoxia 2010   no longer need home O2. stable since 2022   Class 3 severe obesity due to excess calories with serious comorbidity and body mass index (BMI) of 40.0 to 44.9 in adult 12/19/2018   Closed left ankle fracture    DDD (degenerative disc disease), lumbosacral 10/03/2013   Elevated LFTs 10/15/2010   Female pelvic peritoneal adhesions 12/19/2018   Fibromyalgia    Galactorrhea not  associated with childbirth 12/19/2018   GERD (gastroesophageal reflux disease) 07/05/2008   Hiatal hernia 11/03/2013   Hyperglycemia 06/13/2008   Hypertension    Intractable migraine with aura 11/03/2013   LBD (Lewy body dementia) (HCC)    Has memory lapse when under stress   Lumbar radiculopathy 08/30/2012   Lumbosacral spondylosis 11/03/2013   Mixed hyperlipidemia 10/04/2019   Oropharyngeal dysphagia 03/01/2018   " certain foods are hard to swollow"   OSA (obstructive sleep apnea) 06/13/2008   mild no C-PAP. home O2 PRN   PONV (postoperative nausea and vomiting)    Schizoaffective disorder, bipolar type 10/04/2019   Seizure disorder    last seizure 2014   Thyroid disease 10/03/2013   has nodule Lt thyroid   Vitamin D deficiency 10/04/2019   Vocal cord disorder 04/02/2009   hoarse voice due to thyroid. no problems with intubation   Past Surgical History:  Procedure Laterality Date   APPENDECTOMY  1988   laparoscopic   BREAST LUMPECTOMY WITH NEEDLE LOCALIZATION Left 06/06/2015   Procedure: LEFT BREAST LUMPECTOMY WITH NEEDLE LOCALIZATION TIMES TWO;  Surgeon: Harriette Bouillon, MD;  Location: [facility];  Service: General;  Laterality: Left;   CESAREAN SECTION  1988   twins   CHOLECYSTECTOMY  yrs ago   COLONOSCOPY N/A 06/12/2013   Procedure: COLONOSCOPY;  Surgeon: Hilarie Fredrickson, MD;  Location: [facility];  Service: Endoscopy;  Laterality: N/A;   HERNIA REPAIR  yrs ago   umbilical   ORIF ANKLE FRACTURE Left 07/16/2020   Procedure: OPEN REDUCTION INTERNAL FIXATION (ORIF) ANKLE FRACTURE;  Surgeon:  Sheral Apley, MD;  Location: [facility];  Service: Orthopedics;  Laterality: Left;   TOTAL KNEE ARTHROPLASTY Right 12/22/2022   Procedure: TOTAL KNEE ARTHROPLASTY;  Surgeon: Sheral Apley, MD;  Location: [facility];  Service: Orthopedics;  Laterality: Right;   TUBAL LIGATION  1988   VAGINAL HYSTERECTOMY  2005   complete   Social History   Tobacco Use    Smoking status: Former    Packs/day: 1.00    Years: 5.00    Additional pack years: 0.00    Total pack years: 5.00    Types: Cigarettes    Quit date: 07/07/2007    Years since quitting: 15.5    Passive exposure: Never   Smokeless tobacco: Never  Vaping Use   Vaping Use: Never used  Substance Use Topics   Alcohol use: No   Drug use: No   Family History  Problem Relation Age of Onset   Heart disease Father    Ovarian cancer Mother    Breast cancer Mother    Alzheimer's disease Sister 54   Colon cancer Neg Hx    Esophageal cancer Neg Hx    Stomach cancer Neg Hx    Rectal cancer Neg Hx    Allergies  Allergen Reactions   Bee Venom Anaphylaxis   Depakote [Divalproex Sodium] Shortness Of Breath and Rash   Haloperidol Lactate Swelling   Hornet Venom Anaphylaxis   Latex Anaphylaxis and Rash    mild rash/itching, wheezing/sob   Peanut (Diagnostic) Anaphylaxis   Peanut-Containing Drug Products Anaphylaxis   Penicillins Anaphylaxis and Swelling    Per patient a "shot" gave her a "very bad reaction" with facial swelling and throat/tongue swelling. Cannot clearly recall if she had medications to counteract reaction. RECEIVED ANCEF on 12-22-22 with NO APPARENT ISSUES   Quetiapine Anaphylaxis and Other (See Comments)   Shellfish-Derived Products Anaphylaxis   Valproic Acid Swelling   Butorphanol Tartrate Other (See Comments)    Unknown reaction per patient   Amitriptyline Hcl Other (See Comments)    HALLUCINATIONS   Gabapentin Other (See Comments)    Pt reports severe back and side pain   Iodine Other (See Comments)    Flushing and fainting   Pregabalin Rash   Terbutaline Rash      ROS Negative unless stated above    Objective:     BP 124/80 (BP Location: Left Arm, Patient Position: Sitting, Cuff Size: Large)   Pulse 67   Temp 98.2 F (36.8 C) (Oral)   Wt 265 lb 12.8 oz (120.6 kg)   SpO2 99%   BMI 39.25 kg/m    Physical Exam Constitutional:      General: She  is not in acute distress.    Appearance: Normal appearance.  HENT:     Head: Normocephalic and atraumatic.     Nose: Nose normal.     Mouth/Throat:     Mouth: Mucous membranes are moist.  Cardiovascular:     Rate and Rhythm: Normal rate and regular rhythm.     Heart sounds: Normal heart sounds. No murmur heard.    No gallop.  Pulmonary:     Effort: Pulmonary effort is normal. No respiratory distress.     Breath sounds: Normal breath sounds. No wheezing, rhonchi or rales.  Skin:    General: Skin is warm and dry.     Comments: Healing midline surgical incision right knee with Steri-Strips in place.  Neurological:     Mental Status: She is alert  and oriented to person, place, and time. Mental status is at baseline.     Cranial Nerves: Cranial nerves 2-12 are intact.     Comments: Ambulating with walker      No results found for any visits on 01/13/23.    Assessment & Plan:  S/P total knee arthroplasty, right -Stable -Continue PT and ambulating with walker -Continue tramadol 50 mg, Robaxin-750 mg as needed for muscle spasms (not to be used with tizanidine "( -Continue follow-up with Ortho, [name]  Mild Lewy body dementia with psychotic disturbance (HCC) -Stable -Continue Aricept 10 mg nightly -Continue follow-up with neurology, Shon Millet, DO  Schizoaffective disorder, bipolar type -Stable -Continue current medications including Prozac 80 mg daily, Lamictal 200 mg nightly, Remeron 15 mg nightly  Essential hypertension -Controlled -Continue Toprol XL 50 mg daily -Continue lifestyle modifications  Moderate persistent asthma without complication -Stable -Continue Trelegy Ellipta 100-62.5-25 mcg, albuterol inhaler or nebulizer as needed   Patient completed records release form this visit.  Once she knows the name of the provider she will be seeing in [place], [place], we will fax the information.  Return if symptoms worsen or fail to improve.   Deeann Saint, MD

## 2023-01-14 DIAGNOSIS — M6281 Muscle weakness (generalized): Secondary | ICD-10-CM | POA: Diagnosis not present

## 2023-01-14 DIAGNOSIS — R262 Difficulty in walking, not elsewhere classified: Secondary | ICD-10-CM | POA: Diagnosis not present

## 2023-01-14 DIAGNOSIS — M25661 Stiffness of right knee, not elsewhere classified: Secondary | ICD-10-CM | POA: Diagnosis not present

## 2023-01-14 DIAGNOSIS — M1711 Unilateral primary osteoarthritis, right knee: Secondary | ICD-10-CM | POA: Diagnosis not present

## 2023-01-15 DIAGNOSIS — M1711 Unilateral primary osteoarthritis, right knee: Secondary | ICD-10-CM | POA: Diagnosis not present

## 2023-01-19 DIAGNOSIS — R262 Difficulty in walking, not elsewhere classified: Secondary | ICD-10-CM | POA: Diagnosis not present

## 2023-01-19 DIAGNOSIS — M6281 Muscle weakness (generalized): Secondary | ICD-10-CM | POA: Diagnosis not present

## 2023-01-19 DIAGNOSIS — M1711 Unilateral primary osteoarthritis, right knee: Secondary | ICD-10-CM | POA: Diagnosis not present

## 2023-01-19 DIAGNOSIS — M25661 Stiffness of right knee, not elsewhere classified: Secondary | ICD-10-CM | POA: Diagnosis not present

## 2023-02-03 DIAGNOSIS — M1711 Unilateral primary osteoarthritis, right knee: Secondary | ICD-10-CM | POA: Diagnosis not present

## 2023-02-17 LAB — EXTERNAL GENERIC LAB PROCEDURE

## 2023-03-01 DIAGNOSIS — F250 Schizoaffective disorder, bipolar type: Secondary | ICD-10-CM | POA: Diagnosis not present

## 2023-03-17 ENCOUNTER — Telehealth: Payer: Self-pay

## 2023-03-17 NOTE — Telephone Encounter (Signed)
Unsuccessful attempt to reach patient on preferred number listed in notes for scheduled AWV. Left message on voicemail okay to to reschedule.

## 2023-04-19 DIAGNOSIS — Z1211 Encounter for screening for malignant neoplasm of colon: Secondary | ICD-10-CM | POA: Diagnosis not present

## 2023-04-20 ENCOUNTER — Telehealth: Payer: Self-pay | Admitting: Family Medicine

## 2023-04-20 DIAGNOSIS — G8929 Other chronic pain: Secondary | ICD-10-CM

## 2023-04-20 DIAGNOSIS — I10 Essential (primary) hypertension: Secondary | ICD-10-CM

## 2023-04-20 NOTE — Telephone Encounter (Signed)
Prescription Request  04/20/2023  LOV: 01/13/2023  What is the name of the medication or equipment? Tramadol and Metoprolol. Pt would like prescriptions sent to a new pharmacy, [facility].   Have you contacted your pharmacy to request a refill? No   Which pharmacy would you like this sent to?  [facility] - [address] - [place], [place] - [address]     Patient notified that their request is being sent to the clinical staff for review and that they should receive a response within 2 business days.   Please advise at Mobile [phone] (mobile)

## 2023-04-26 MED ORDER — METOPROLOL SUCCINATE ER 50 MG PO TB24
ORAL_TABLET | ORAL | 1 refills | Status: DC
Start: 2023-04-26 — End: 2023-04-28

## 2023-04-26 MED ORDER — TRAMADOL HCL 50 MG PO TABS
ORAL_TABLET | ORAL | 1 refills | Status: AC
Start: 2023-04-26 — End: ?

## 2023-04-27 NOTE — Telephone Encounter (Signed)
Nile with [facility] is calling the patient is allergic to morphine and metoprolol needs directions how often does pt need to take medication

## 2023-04-28 ENCOUNTER — Other Ambulatory Visit: Payer: Self-pay | Admitting: Family Medicine

## 2023-04-28 ENCOUNTER — Other Ambulatory Visit: Payer: Self-pay | Admitting: Endocrinology

## 2023-04-28 DIAGNOSIS — G8929 Other chronic pain: Secondary | ICD-10-CM

## 2023-04-28 DIAGNOSIS — I10 Essential (primary) hypertension: Secondary | ICD-10-CM

## 2023-04-28 DIAGNOSIS — E049 Nontoxic goiter, unspecified: Secondary | ICD-10-CM

## 2023-04-28 LAB — EXTERNAL GENERIC LAB PROCEDURE: COLOGUARD: POSITIVE — AB

## 2023-04-28 MED ORDER — METOPROLOL SUCCINATE ER 50 MG PO TB24: ORAL_TABLET | ORAL | 1 refills | Status: DC

## 2023-04-28 NOTE — Telephone Encounter (Signed)
Pt is not allergic to morphine or at least she has not been in the past.  Rx for Metoprolol was resent.  It appears the daily dosing instructions were removed during a previous refill.  Please advise pt, provider unable to send controlled substance (Tramadol) out of state.

## 2023-04-29 NOTE — Telephone Encounter (Signed)
Called patient and was unable to leave VM

## 2023-05-05 NOTE — Progress Notes (Signed)
Subjective:    Patient ID: [name], female    DOB: 1961-01-01, 62 y.o.   MRN: [id number] HPI F former smoker followed for asthma and allergic rhinitis, complicated by GERD, depression, VCD, seizure disorder PFT 07/05/08- FVC 3.51/ 88%, FEV1 2.38/ 78%, R 0.68, 25-75% 1.45/ 44%, TLC 83%, DLCO 68% Allergy Profile 01/18/2013-total IgE 242.4 with broad elevations for common inhalant allergens. PFT 10/21/20- Moderate obstructive airways disease with slight respnse to bronchodilator. Normal Diffusion. FEV!/FVC 0.75.  -----------------------------------------------------------------------------------  12/29/21- 62 yoF  former smoker followed for asthma and allergic rhinitis, Chronic Respiratory Failure with Hypoxia, complicated by GERD, Depression, VCD, Seizure disorder, Obesity, Lewy Body Dementia, BiPolar,  O2 2L sleep/ [employer]- owns it- no longer using -Trelegy 100, ProAir hfa, Epipen, Neb albuterol,  Covid vax- 2 Phizer Being followed by Neurology (Dr Everlena Cooper) for dementia She is no longer using oxygen at night-stopped at least several months ago. Trelegy works well but it has been causing thrush.  Her insurance covers the Trelegy so we will try to continue it if thrush can be managed. CXR 08/15/21- IMPRESSION: No active cardiopulmonary disease.  05/07/23-  Virtual Visit via Video Note  I connected with [name] on 05/05/23 at 10:30 AM EDT by a video enabled telemedicine application and verified that I am speaking with the correct person using two identifiers.  Location: Patient: home Provider: office   I discussed the limitations of evaluation and management by telemedicine and the availability of in person appointments. The patient expressed understanding and agreed to proceed.  History of Present Illness:62 yoF  former smoker followed for asthma and allergic Rhinitis, Chronic Respiratory Failure with Hypoxia, complicated by GERD, Depression, VCD, Seizure disorder, Obesity,  Lewy Body Dementia, BiPolar/ SchizoAffective,  O2 2L sleep/ [employer]- owns it- no longer using -Trelegy 100, ProAir hfa, Epipen, Neb albuterol,  Being followed by Neurology (Dr Everlena Cooper) for dementia Hosp TKR/ Dr Renaye Rakers 12/22/22- She reports no complications. Has "a little bit of wheezing" some days. Using albuterol hfa once "most days", blaming weather change. Has not been using maintenance inhaler- Trelegy caused thrush. We will try Anoro. She intends to get flu vax at drug store.   Observations/Objective: CXR 11/04/22- IMPRESSION: No acute chest findings.  Assessment and Plan: Asthma- moderate persistent uncomplicated Hypoic Resp Failure hx- consider ONOX to update status Follow Up Instructions:    I discussed the assessment and treatment plan with the patient. The patient was provided an opportunity to ask questions and all were answered. The patient agreed with the plan and demonstrated an understanding of the instructions.   The patient was advised to call back or seek an in-person evaluation if the symptoms worsen or if the condition fails to improve as anticipated.  I provided 20 minutes of non-face-to-face time during this encounter.   [name], MD   ROS-see HPI  + = positive Constitutional:   No-   weight loss, night sweats, fevers, chills, fatigue, lassitude. HEENT:   No-  headaches, difficulty swallowing, tooth/dental problems, sore throat,       +sneezing, itching, ear ache, nasal congestion, post nasal drip,  CV:  No-   chest pain, orthopnea, PND, swelling in lower extremities, anasarca, dizziness, palpitations Resp: No-   shortness of breath with exertion or at rest.              No-   productive cough,  No non-productive cough,  No- coughing up of blood.  No-   change in color of mucus.  No- wheezing now.   Skin:  GI:  No-   heartburn, indigestion, abdominal pain, nausea, vomiting,  GU:  MS:  No-   joint pain or swelling.   Neuro-     nothing  unusual Psych:  No- change in mood or affect. No depression or anxiety.  No memory loss.  Objective:  OBJ- Physical Exam General- Alert, Oriented, Affect-appropriate, Distress- none acute. + obese Skin- rash-none, lesions- none, excoriation- none Lymphadenopathy- none Head- atraumatic            Eyes- Gross vision intact, PERRLA, conjunctivae and secretions clear            Ears- Hearing, canals-normal            Nose- Clear, no-Septal dev, mucus, polyps, erosion, perforation             Throat- Mallampati II-III , mucosa clear , drainage- none, tonsils- atrophic Neck- flexible , trachea midline, no stridor , thyroid nl, carotid no bruit Chest - symmetrical excursion , unlabored           Heart/CV- RRR , no murmur , no gallop  , no rub, nl s1 s2                           - JVD- none , edema- none, stasis changes- none, varices- none           Lung- clear to P&A, wheeze- none, cough- none , dullness-none, rub- none           Chest wall-  Abd-  Br/ Gen/ Rectal- Not done, not indicated Extrem-  Neuro- grossly intact to observation  Assessment & Plan:

## 2023-05-05 NOTE — Progress Notes (Deleted)
Subjective:    Patient ID: [name], female    DOB: 1961-01-03, 62 y.o.   MRN: [id number] HPI F former smoker followed for asthma and allergic rhinitis, complicated by GERD, depression, VCD, seizure disorder PFT 07/05/08- FVC 3.51/ 88%, FEV1 2.38/ 78%, R 0.68, 25-75% 1.45/ 44%, TLC 83%, DLCO 68% Allergy Profile 01/18/2013-total IgE 242.4 with broad elevations for common inhalant allergens. -----------------------------------------------------------------------------------   10/21/20-59 yoF  former smoker followed for asthma and allergic rhinitis, complicated by GERD, Depression, VCD, seizure disorder, Obesity,  O2 2L sleep/ [employer] -Advair 500, ProAir hfa, Epipen, Fractured L ankle- January.  Covid vax- 2 Phizer Flu vax- had ACT score 23  -----Patient feels that breathing is doing very good. No concerns Feeling very well now with no cough, wheeze or unusual dyspnea. No recent acute illness. She is pending orthopedic surgery and asks clearance.  Continues to sleep with O2, but not needed in daytime. Started remotely for nocturnal hypoxemia attributed to obesity hypoventilation. Routinely uses Advair 500. Denies thrush.  PFT 10/21/20- Moderate obstructive airways disease with slight respnse to bronchodilator. Normal Diffusion. FEV!/FVC 0.75.  CXR 04/30/20- IMPRESSION: Unremarkable radiographs of the chest.  No acute findings.  12/29/21- 63 yoF  former smoker followed for asthma and allergic rhinitis, Chronic Respiratory Failure with Hypoxia, complicated by GERD, Depression, VCD, Seizure disorder, Obesity, Lewy Body Dementia, BiPolar,  O2 2L sleep/ [employer]- owns it- no longer using -Trelegy 100, ProAir hfa, Epipen, Neb albuterol,  Covid vax- 2 Phizer Being followed by Neurology (Dr Everlena Cooper) for dementia She is no longer using oxygen at night-stopped at least several months ago. Trelegy works well but it has been causing thrush.  Her insurance covers the Trelegy so we will try to continue  it if thrush can be managed. CXR 08/15/21- IMPRESSION: No active cardiopulmonary disease.  ROS-see HPI  + = positive Constitutional:   No-   weight loss, night sweats, fevers, chills, fatigue, lassitude. HEENT:   No-  headaches, difficulty swallowing, tooth/dental problems, sore throat,       +sneezing, itching, ear ache, nasal congestion, post nasal drip,  CV:  No-   chest pain, orthopnea, PND, swelling in lower extremities, anasarca, dizziness, palpitations Resp: No-   shortness of breath with exertion or at rest.              No-   productive cough,  No non-productive cough,  No- coughing up of blood.              No-   change in color of mucus.  No- wheezing now.   Skin:  GI:  No-   heartburn, indigestion, abdominal pain, nausea, vomiting,  GU:  MS:  No-   joint pain or swelling.   Neuro-     nothing unusual Psych:  No- change in mood or affect. No depression or anxiety.  No memory loss.  Objective:  OBJ- Physical Exam General- Alert, Oriented, Affect-appropriate, Distress- none acute. + obese Skin- rash-none, lesions- none, excoriation- none Lymphadenopathy- none Head- atraumatic            Eyes- Gross vision intact, PERRLA, conjunctivae and secretions clear            Ears- Hearing, canals-normal            Nose- Clear, no-Septal dev, mucus, polyps, erosion, perforation             Throat- Mallampati II-III , mucosa clear , drainage- none, tonsils- atrophic Neck- flexible , trachea midline, no  stridor , thyroid nl, carotid no bruit Chest - symmetrical excursion , unlabored           Heart/CV- RRR , no murmur , no gallop  , no rub, nl s1 s2                           - JVD- none , edema- none, stasis changes- none, varices- none           Lung- clear to P&A, wheeze- none, cough- none , dullness-none, rub- none           Chest wall-  Abd-  Br/ Gen/ Rectal- Not done, not indicated Extrem-  Neuro- grossly intact to observation  Assessment & Plan:

## 2023-05-07 ENCOUNTER — Encounter: Payer: Self-pay | Admitting: Internal Medicine

## 2023-05-07 ENCOUNTER — Telehealth (INDEPENDENT_AMBULATORY_CARE_PROVIDER_SITE_OTHER): Payer: Medicare Other | Admitting: Internal Medicine

## 2023-05-07 ENCOUNTER — Other Ambulatory Visit: Payer: Self-pay | Admitting: Internal Medicine

## 2023-05-07 DIAGNOSIS — J4540 Moderate persistent asthma, uncomplicated: Secondary | ICD-10-CM

## 2023-05-07 DIAGNOSIS — J9611 Chronic respiratory failure with hypoxia: Secondary | ICD-10-CM

## 2023-05-07 DIAGNOSIS — Z87891 Personal history of nicotine dependence: Secondary | ICD-10-CM

## 2023-05-07 MED ORDER — ALBUTEROL SULFATE HFA 108 (90 BASE) MCG/ACT IN AERS: INHALATION_SPRAY | RESPIRATORY_TRACT | 12 refills | Status: AC

## 2023-05-07 MED ORDER — ANORO ELLIPTA 62.5-25 MCG/ACT IN AEPB: INHALATION_SPRAY | RESPIRATORY_TRACT | 12 refills | Status: DC

## 2023-05-07 NOTE — Assessment & Plan Note (Signed)
Moderate persistent uncomplicated. Dropped off Trelegy due to thrush and now using rescue inh about 1x/ day. Plan- change maintenance to Anoro. Refill albuterol hfa.

## 2023-05-07 NOTE — Telephone Encounter (Signed)
I had ordered Anoro, but pharmacy indicates insurance prefers Stiolto to replace Trelegy, which caused too much thrush. Script sent for [employer].

## 2023-05-07 NOTE — Assessment & Plan Note (Signed)
Has felt she sleeps well without O2 over last year.  Plan- consider when to update ONOX on room air.

## 2023-06-08 NOTE — Patient Instructions (Signed)
Trial Anoro set to [facility]. Avoiding inhaled steroid due to thrush.  Reminder to get flu shot

## 2023-06-16 ENCOUNTER — Telehealth: Payer: Self-pay | Admitting: Family Medicine

## 2023-06-16 DIAGNOSIS — Z1211 Encounter for screening for malignant neoplasm of colon: Secondary | ICD-10-CM

## 2023-06-16 NOTE — Telephone Encounter (Signed)
Pt requesting a new order for mammogram, says the other one expired. Requesting a call regarding cologuard results (positive for blood in stool) and a possible referral to GI

## 2023-06-21 NOTE — Telephone Encounter (Signed)
Pt is calling checking on the below request

## 2023-06-22 ENCOUNTER — Other Ambulatory Visit: Payer: Self-pay

## 2023-06-22 NOTE — Telephone Encounter (Signed)
Pt would like mammogram at [facility] center

## 2023-06-23 NOTE — Addendum Note (Signed)
Addended by: Philipp Deputy A on: 06/23/2023 02:25 PM   Modules accepted: Orders

## 2023-06-23 NOTE — Telephone Encounter (Signed)
Called and spoke with patient the order for the mamo has already been placed and her Referral to GI was placed today, patient is aware.

## 2023-06-28 ENCOUNTER — Encounter: Payer: Self-pay | Admitting: Internal Medicine

## 2023-07-02 ENCOUNTER — Telehealth: Payer: Self-pay | Admitting: Family Medicine

## 2023-07-02 NOTE — Telephone Encounter (Signed)
Copied from CRM [id number]. Topic: Medical Record Request - Records Request >> Jul 02, 2023 11:01 AM Fredrich Romans wrote: Reason for CRM: Patient has recently moved [place] and would like for her medical records sent to her new office. Dr Kandice Moos Phone #:[phone] Fax#:[phone] **spoke with patient, she has already filled out the form and turned it in here at the office

## 2023-08-18 ENCOUNTER — Telehealth: Payer: Self-pay | Admitting: Family Medicine

## 2023-08-18 NOTE — Telephone Encounter (Signed)
Yes.  Patient moved to [place].

## 2023-08-18 NOTE — Telephone Encounter (Signed)
I spoke with patient to schedule AWV  She stated she has moved out of state and has new pcp

## 2023-09-16 ENCOUNTER — Telehealth: Payer: Self-pay | Admitting: *Deleted

## 2023-09-16 NOTE — Telephone Encounter (Signed)
 Pt is returning your call

## 2023-09-16 NOTE — Telephone Encounter (Signed)
 Copied from CRM [id number]. Topic: General - Other >> Sep 16, 2023  3:43 PM Eunice Blase wrote: Reason for CRM: Pt called back from dropped call. Please call pt at [phone]

## 2023-09-16 NOTE — Telephone Encounter (Signed)
 Patient was following up from a call with robin, patient states she is out of the state and found a different PCP

## 2023-09-16 NOTE — Telephone Encounter (Signed)
Called patient and left a VM to return call.

## 2023-09-16 NOTE — Telephone Encounter (Signed)
 Copied from CRM [id number]. Topic: General - Other >> Sep 16, 2023  2:34 PM Elizebeth Brooking wrote: Reason for CRM: Patient called in wanted to speak with Nurse Zella Ball, is requesting a callback

## 2023-11-19 ENCOUNTER — Other Ambulatory Visit: Payer: Self-pay | Admitting: Family Medicine

## 2023-11-19 DIAGNOSIS — I10 Essential (primary) hypertension: Secondary | ICD-10-CM

## 2024-03-08 LAB — PROTEIN, CSF 14-3-3 (PRION DISEASE)
14-3-3 PROTEIN (CSF)++: 5056 [AU]/ml — ABNORMAL HIGH (ref 30–1999)
EST PROB PRION DIS IN PATIENT: <0.2 %
RT-QUIC (CSF)*: NEGATIVE
T-TAU PROTEIN (CSF)++: 299 pg/mL (ref 0–1149)

## 2024-03-08 LAB — CNS IGG SYNTHESIS RATE, CSF+BLOOD
Albumin Serum: 3.7 g/dL (ref 3.6–5.1)
Albumin, CSF: 23.4 mg/dL (ref 8.0–42.0)
CNS-IgG Synthesis Rate: -3.4 mg/(24.h) (ref <=3.3)
IgG (Immunoglobin G), Serum: 1200 mg/dL (ref 600–1640)
IgG Total CSF: 3.6 mg/dL (ref 0.8–7.7)
IgG-Index: 0.47 (ref <0.70)

## 2024-03-08 LAB — CSF CELL COUNT WITH DIFFERENTIAL
RBC Count, CSF: 4 {cells}/uL — ABNORMAL HIGH
TOTAL NUCLEATED CELL: 4 {cells}/uL (ref 0–5)

## 2024-03-08 LAB — CSF CULTURE W GRAM STAIN
GRAM STAIN:: NONE SEEN
MICRO NUMBER:: 13364443
Result:: NO GROWTH
SPECIMEN QUALITY:: ADEQUATE

## 2024-03-08 LAB — FUNGUS CULTURE W SMEAR
CULTURE:: NO GROWTH
MICRO NUMBER:: 13364442
SMEAR:: NONE SEEN
SPECIMEN QUALITY:: ADEQUATE

## 2024-03-08 LAB — EXTRA SPECIMEN

## 2024-03-08 LAB — GLUCOSE, CSF: Glucose, CSF: 74 mg/dL (ref 40–80)

## 2024-03-08 LAB — CRYPTOCOCCAL AG, LTX SCR RFLX TITER
Cryptococcal Ag Screen: NOT DETECTED
MICRO NUMBER:: 13364441
SPECIMEN QUALITY:: ADEQUATE

## 2024-03-08 LAB — VDRL, CSF: VDRL Quant, CSF: NONREACTIVE

## 2024-03-08 LAB — OLIGOCLONAL BANDS, CSF + SERM: Oligo Bands: ABSENT

## 2024-03-08 LAB — PROTEIN, CSF: Total Protein, CSF: 46 mg/dL — ABNORMAL HIGH (ref 15–45)

## 2024-04-10 ENCOUNTER — Telehealth: Payer: Self-pay | Admitting: Family Medicine

## 2024-04-10 NOTE — Telephone Encounter (Signed)
 Spoke with Daughter per DPR, patient would need to sign a records release to fax any information over to [facility], she was given last EGFR >60.0 from 08/17/2023

## 2024-04-10 NOTE — Telephone Encounter (Signed)
 Copied from CRM [id number]. Topic: General - Other >> Apr 10, 2024  9:27 AM Wess RAMAN wrote: Reason for CRM: Patient's daughter, Gerold, stated [facility] would like to know patient's GFR levels and what stage she is in  [facility] Fax #: [phone] Callback #: [phone]
